# Patient Record
Sex: Female | Born: 1945 | Race: White | Hispanic: No | Smoking: Current every day smoker
Health system: Southern US, Community
[De-identification: ages and names within clinical notes are randomized; demographics above are authoritative.]

## PROBLEM LIST (undated history)

## (undated) DIAGNOSIS — I639 Cerebral infarction, unspecified: Secondary | ICD-10-CM

## (undated) DIAGNOSIS — F329 Major depressive disorder, single episode, unspecified: Secondary | ICD-10-CM

## (undated) DIAGNOSIS — I10 Essential (primary) hypertension: Secondary | ICD-10-CM

## (undated) DIAGNOSIS — N3946 Mixed incontinence: Secondary | ICD-10-CM

## (undated) DIAGNOSIS — F32A Depression, unspecified: Secondary | ICD-10-CM

## (undated) DIAGNOSIS — R32 Unspecified urinary incontinence: Secondary | ICD-10-CM

## (undated) DIAGNOSIS — R296 Repeated falls: Secondary | ICD-10-CM

## (undated) DIAGNOSIS — R251 Tremor, unspecified: Secondary | ICD-10-CM

## (undated) DIAGNOSIS — I4891 Unspecified atrial fibrillation: Secondary | ICD-10-CM

## (undated) HISTORY — PX: APPENDECTOMY: SHX54

## (undated) HISTORY — DX: Mixed incontinence: N39.46

## (undated) HISTORY — DX: Major depressive disorder, single episode, unspecified: F32.9

## (undated) HISTORY — DX: Depression, unspecified: F32.A

## (undated) HISTORY — DX: Tremor, unspecified: R25.1

## (undated) HISTORY — PX: TUBAL LIGATION: SHX77

## (undated) HISTORY — DX: Unspecified urinary incontinence: R32

## (undated) HISTORY — DX: Repeated falls: R29.6

---

## 2004-12-26 ENCOUNTER — Inpatient Hospital Stay: Payer: Self-pay | Admitting: Internal Medicine

## 2004-12-26 ENCOUNTER — Other Ambulatory Visit: Payer: Self-pay

## 2007-03-02 ENCOUNTER — Inpatient Hospital Stay: Payer: Self-pay | Admitting: Internal Medicine

## 2007-03-02 ENCOUNTER — Other Ambulatory Visit: Payer: Self-pay

## 2007-04-08 ENCOUNTER — Emergency Department: Payer: Self-pay | Admitting: Emergency Medicine

## 2007-04-15 ENCOUNTER — Emergency Department: Payer: Self-pay | Admitting: Emergency Medicine

## 2009-05-26 ENCOUNTER — Emergency Department: Payer: Self-pay | Admitting: Emergency Medicine

## 2009-06-16 ENCOUNTER — Ambulatory Visit: Payer: Self-pay | Admitting: Internal Medicine

## 2009-06-21 ENCOUNTER — Ambulatory Visit: Payer: Self-pay | Admitting: Internal Medicine

## 2009-06-30 ENCOUNTER — Ambulatory Visit: Payer: Self-pay | Admitting: Internal Medicine

## 2009-07-07 ENCOUNTER — Ambulatory Visit: Payer: Self-pay | Admitting: Internal Medicine

## 2009-07-14 ENCOUNTER — Ambulatory Visit: Payer: Self-pay | Admitting: Internal Medicine

## 2009-07-21 ENCOUNTER — Ambulatory Visit: Payer: Self-pay | Admitting: Internal Medicine

## 2011-11-15 ENCOUNTER — Emergency Department: Payer: Self-pay | Admitting: Emergency Medicine

## 2014-12-24 ENCOUNTER — Other Ambulatory Visit: Payer: Self-pay | Admitting: Family Medicine

## 2014-12-24 DIAGNOSIS — Z1231 Encounter for screening mammogram for malignant neoplasm of breast: Secondary | ICD-10-CM

## 2015-01-12 ENCOUNTER — Ambulatory Visit: Payer: Self-pay | Attending: Family Medicine

## 2015-12-06 ENCOUNTER — Encounter: Payer: Self-pay | Admitting: *Deleted

## 2015-12-21 ENCOUNTER — Encounter: Payer: Self-pay | Admitting: Urology

## 2015-12-21 ENCOUNTER — Ambulatory Visit: Payer: Self-pay | Admitting: Urology

## 2016-12-19 ENCOUNTER — Ambulatory Visit: Payer: Self-pay | Admitting: Urology

## 2017-01-08 ENCOUNTER — Ambulatory Visit: Payer: Self-pay | Admitting: Urology

## 2017-01-08 ENCOUNTER — Encounter: Payer: Self-pay | Admitting: Urology

## 2017-10-29 ENCOUNTER — Inpatient Hospital Stay
Admission: EM | Admit: 2017-10-29 | Discharge: 2017-11-01 | DRG: 070 | Disposition: A | Discharge status: Home Health | Payer: Medicare HMO | Attending: Internal Medicine | Admitting: Internal Medicine

## 2017-10-29 ENCOUNTER — Encounter: Payer: Self-pay | Admitting: Internal Medicine

## 2017-10-29 ENCOUNTER — Other Ambulatory Visit: Payer: Self-pay

## 2017-10-29 ENCOUNTER — Inpatient Hospital Stay: Payer: Medicare HMO

## 2017-10-29 ENCOUNTER — Emergency Department: Payer: Medicare HMO

## 2017-10-29 DIAGNOSIS — I639 Cerebral infarction, unspecified: Secondary | ICD-10-CM | POA: Diagnosis present

## 2017-10-29 DIAGNOSIS — G9341 Metabolic encephalopathy: Principal | ICD-10-CM | POA: Diagnosis present

## 2017-10-29 DIAGNOSIS — Z66 Do not resuscitate: Secondary | ICD-10-CM | POA: Diagnosis present

## 2017-10-29 DIAGNOSIS — Z87891 Personal history of nicotine dependence: Secondary | ICD-10-CM | POA: Diagnosis not present

## 2017-10-29 DIAGNOSIS — R1110 Vomiting, unspecified: Secondary | ICD-10-CM

## 2017-10-29 DIAGNOSIS — E872 Acidosis: Secondary | ICD-10-CM | POA: Diagnosis present

## 2017-10-29 DIAGNOSIS — Z79899 Other long term (current) drug therapy: Secondary | ICD-10-CM

## 2017-10-29 DIAGNOSIS — N390 Urinary tract infection, site not specified: Secondary | ICD-10-CM | POA: Diagnosis not present

## 2017-10-29 DIAGNOSIS — I10 Essential (primary) hypertension: Secondary | ICD-10-CM | POA: Diagnosis present

## 2017-10-29 DIAGNOSIS — R262 Difficulty in walking, not elsewhere classified: Secondary | ICD-10-CM | POA: Diagnosis present

## 2017-10-29 DIAGNOSIS — G8191 Hemiplegia, unspecified affecting right dominant side: Secondary | ICD-10-CM | POA: Diagnosis present

## 2017-10-29 DIAGNOSIS — N3000 Acute cystitis without hematuria: Secondary | ICD-10-CM | POA: Diagnosis present

## 2017-10-29 DIAGNOSIS — R000 Tachycardia, unspecified: Secondary | ICD-10-CM | POA: Diagnosis present

## 2017-10-29 DIAGNOSIS — I340 Nonrheumatic mitral (valve) insufficiency: Secondary | ICD-10-CM | POA: Diagnosis not present

## 2017-10-29 DIAGNOSIS — N179 Acute kidney failure, unspecified: Secondary | ICD-10-CM | POA: Diagnosis present

## 2017-10-29 DIAGNOSIS — R531 Weakness: Secondary | ICD-10-CM

## 2017-10-29 DIAGNOSIS — Z7189 Other specified counseling: Secondary | ICD-10-CM | POA: Diagnosis not present

## 2017-10-29 DIAGNOSIS — Z515 Encounter for palliative care: Secondary | ICD-10-CM | POA: Diagnosis present

## 2017-10-29 DIAGNOSIS — G9340 Encephalopathy, unspecified: Secondary | ICD-10-CM | POA: Diagnosis not present

## 2017-10-29 DIAGNOSIS — I4891 Unspecified atrial fibrillation: Secondary | ICD-10-CM | POA: Diagnosis present

## 2017-10-29 DIAGNOSIS — E785 Hyperlipidemia, unspecified: Secondary | ICD-10-CM | POA: Diagnosis present

## 2017-10-29 DIAGNOSIS — N3946 Mixed incontinence: Secondary | ICD-10-CM | POA: Diagnosis present

## 2017-10-29 DIAGNOSIS — R251 Tremor, unspecified: Secondary | ICD-10-CM | POA: Diagnosis present

## 2017-10-29 DIAGNOSIS — L8990 Pressure ulcer of unspecified site, unspecified stage: Secondary | ICD-10-CM

## 2017-10-29 DIAGNOSIS — R159 Full incontinence of feces: Secondary | ICD-10-CM | POA: Diagnosis present

## 2017-10-29 DIAGNOSIS — Z9181 History of falling: Secondary | ICD-10-CM | POA: Diagnosis not present

## 2017-10-29 HISTORY — DX: Unspecified atrial fibrillation: I48.91

## 2017-10-29 HISTORY — DX: Essential (primary) hypertension: I10

## 2017-10-29 HISTORY — DX: Cerebral infarction, unspecified: I63.9

## 2017-10-29 LAB — COMPREHENSIVE METABOLIC PANEL
ALK PHOS: 91 U/L (ref 38–126)
ALT: 11 U/L — ABNORMAL LOW (ref 14–54)
ANION GAP: 13 (ref 5–15)
AST: 26 U/L (ref 15–41)
Albumin: 4.2 g/dL (ref 3.5–5.0)
BUN: 26 mg/dL — ABNORMAL HIGH (ref 6–20)
CALCIUM: 9.4 mg/dL (ref 8.9–10.3)
CO2: 20 mmol/L — ABNORMAL LOW (ref 22–32)
Chloride: 107 mmol/L (ref 101–111)
Creatinine, Ser: 1.49 mg/dL — ABNORMAL HIGH (ref 0.44–1.00)
GFR, EST AFRICAN AMERICAN: 39 mL/min — AB (ref >60)
GFR, EST NON AFRICAN AMERICAN: 34 mL/min — AB (ref >60)
GLUCOSE: 108 mg/dL — AB (ref 65–99)
POTASSIUM: 4.2 mmol/L (ref 3.5–5.1)
Sodium: 140 mmol/L (ref 135–145)
TOTAL PROTEIN: 7.9 g/dL (ref 6.5–8.1)
Total Bilirubin: 0.9 mg/dL (ref 0.3–1.2)

## 2017-10-29 LAB — CBC WITH DIFFERENTIAL/PLATELET
BASOS PCT: 1 %
Basophils Absolute: 0 10*3/uL (ref 0–0.1)
EOS ABS: 0 10*3/uL (ref 0–0.7)
EOS PCT: 1 %
HCT: 48.5 % — ABNORMAL HIGH (ref 35.0–47.0)
HEMOGLOBIN: 15.5 g/dL (ref 12.0–16.0)
LYMPHS ABS: 0.5 10*3/uL — AB (ref 1.0–3.6)
Lymphocytes Relative: 8 %
MCH: 28.8 pg (ref 26.0–34.0)
MCHC: 32.1 g/dL (ref 32.0–36.0)
MCV: 89.7 fL (ref 80.0–100.0)
MONO ABS: 0.5 10*3/uL (ref 0.2–0.9)
MONOS PCT: 6 %
NEUTROS PCT: 84 %
Neutro Abs: 6.2 10*3/uL (ref 1.4–6.5)
PLATELETS: 121 10*3/uL — AB (ref 150–440)
RBC: 5.4 MIL/uL — ABNORMAL HIGH (ref 3.80–5.20)
RDW: 16.3 % — AB (ref 11.5–14.5)
WBC: 7.3 10*3/uL (ref 3.6–11.0)

## 2017-10-29 LAB — URINALYSIS, COMPLETE (UACMP) WITH MICROSCOPIC
BILIRUBIN URINE: NEGATIVE
Bacteria, UA: NONE SEEN
Glucose, UA: NEGATIVE mg/dL
Ketones, ur: NEGATIVE mg/dL
Nitrite: POSITIVE — AB
PH: 5 (ref 5.0–8.0)
Protein, ur: 100 mg/dL — AB
SPECIFIC GRAVITY, URINE: 1.016 (ref 1.005–1.030)

## 2017-10-29 LAB — LACTIC ACID, PLASMA
LACTIC ACID, VENOUS: 2.2 mmol/L — AB (ref 0.5–1.9)
Lactic Acid, Venous: 2 mmol/L (ref 0.5–1.9)

## 2017-10-29 LAB — TROPONIN I: TROPONIN I: 0.03 ng/mL — AB (ref <0.03)

## 2017-10-29 LAB — PROTIME-INR
INR: 0.96
Prothrombin Time: 12.7 seconds (ref 11.4–15.2)

## 2017-10-29 LAB — CK: Total CK: 59 U/L (ref 38–234)

## 2017-10-29 MED ORDER — METOPROLOL TARTRATE 5 MG/5ML IV SOLN: 5.0000 mg | INTRAVENOUS | Status: DC | PRN

## 2017-10-29 MED ORDER — ACETAMINOPHEN 325 MG PO TABS: 650.0000 mg | ORAL_TABLET | Freq: Four times a day (QID) | ORAL | Status: DC | PRN

## 2017-10-29 MED ORDER — ONDANSETRON HCL 4 MG/2ML IJ SOLN
INTRAMUSCULAR | Status: AC
  Administered 2017-10-29: 4 mg via INTRAVENOUS
  Filled 2017-10-29: qty 2

## 2017-10-29 MED ORDER — ONDANSETRON HCL 4 MG/2ML IJ SOLN
4.0000 mg | Freq: Four times a day (QID) | INTRAMUSCULAR | Status: DC | PRN
  Administered 2017-10-29: 4 mg via INTRAVENOUS

## 2017-10-29 MED ORDER — LISINOPRIL 10 MG PO TABS: 30.0000 mg | ORAL_TABLET | Freq: Every day | ORAL | Status: DC

## 2017-10-29 MED ORDER — ENOXAPARIN SODIUM 40 MG/0.4ML ~~LOC~~ SOLN
40.0000 mg | SUBCUTANEOUS | Status: DC
  Administered 2017-10-29 – 2017-10-31 (×3): 40 mg via SUBCUTANEOUS
  Filled 2017-10-29 (×3): qty 0.4

## 2017-10-29 MED ORDER — ATORVASTATIN CALCIUM 20 MG PO TABS
40.0000 mg | ORAL_TABLET | Freq: Every day | ORAL | Status: DC
  Administered 2017-10-30 – 2017-11-01 (×3): 40 mg via ORAL
  Filled 2017-10-29 (×3): qty 2

## 2017-10-29 MED ORDER — ACETAMINOPHEN 650 MG RE SUPP: 650.0000 mg | Freq: Four times a day (QID) | RECTAL | Status: DC | PRN

## 2017-10-29 MED ORDER — NYSTATIN 100000 UNIT/GM EX POWD
Freq: Two times a day (BID) | CUTANEOUS | Status: DC
  Administered 2017-10-29 – 2017-11-01 (×6): via TOPICAL
  Filled 2017-10-29: qty 15

## 2017-10-29 MED ORDER — SODIUM CHLORIDE 0.9 % IV SOLN
1.0000 g | Freq: Once | INTRAVENOUS | Status: AC
  Administered 2017-10-29: 1 g via INTRAVENOUS
  Filled 2017-10-29: qty 10

## 2017-10-29 MED ORDER — SODIUM CHLORIDE 0.9 % IV SOLN
1.0000 g | INTRAVENOUS | Status: DC
  Administered 2017-10-30 – 2017-10-31 (×2): 1 g via INTRAVENOUS
  Filled 2017-10-29 (×2): qty 10

## 2017-10-29 MED ORDER — ASPIRIN 300 MG RE SUPP: 300.0000 mg | Freq: Every day | RECTAL | Status: DC | PRN

## 2017-10-29 MED ORDER — SODIUM CHLORIDE 0.9 % IV BOLUS (SEPSIS)
500.0000 mL | Freq: Once | INTRAVENOUS | Status: AC
  Administered 2017-10-29: 500 mL via INTRAVENOUS

## 2017-10-29 MED ORDER — DILTIAZEM HCL ER COATED BEADS 240 MG PO CP24
240.0000 mg | ORAL_CAPSULE | Freq: Every day | ORAL | Status: DC
  Filled 2017-10-29: qty 1

## 2017-10-29 MED ORDER — SODIUM CHLORIDE 0.9 % IV SOLN
INTRAVENOUS | Status: DC
  Administered 2017-10-29 – 2017-11-01 (×3): via INTRAVENOUS

## 2017-10-29 MED ORDER — ASPIRIN EC 81 MG PO TBEC: 81.0000 mg | DELAYED_RELEASE_TABLET | Freq: Every day | ORAL | Status: DC

## 2017-10-29 NOTE — H&P (Signed)
[facility] [place] [facility]   PATIENT NAME: Destiny Mack    MR#:  [id number]  DATE OF BIRTH:  01/13/1946  DATE OF ADMISSION:  10/29/2017  PRIMARY CARE PHYSICIAN: Dr Hessie Diener  REQUESTING/REFERRING PHYSICIAN: Dr Alphonzo Lemmings  CHIEF COMPLAINT:   Chief Complaint  Patient presents with  . Altered Mental Status    HISTORY OF PRESENT ILLNESS:  Destiny Mack  is a 72 y.o. female with a known history of recent stroke 3-4 months ago when she refused to come into the hospital.  She has right-sided weakness and unable to walk since then.  She is coming in with altered mental status for the last 2 days.  She has not been eating.  She has been unable to smoke and family took away her cigarettes over the last 2 days.  Since that stroke she has had no feeling of using the bathroom with her bowels and urine.  When she came in she was covered in stool and urine.  Hospitalist services were contacted for further evaluation with altered mental status.  Found to have a positive urine analysis.  PAST MEDICAL HISTORY:   Past Medical History:  Diagnosis Date  . Depression   . Frequent falls   . Leaking of urine   . Mixed incontinence   . Stroke (HCC)   . Tremor of hands and face     PAST SURGICAL HISTORY:   Past Surgical History:  Procedure Laterality Date  . TUBAL LIGATION      SOCIAL HISTORY:   Social History   Tobacco Use  . Smoking status: Former Games developer  . Smokeless tobacco: Never Used  Substance Use Topics  . Alcohol use: No    Frequency: Never    FAMILY HISTORY:   Family History  Problem Relation Age of Onset  . Dementia Mother   . Heart failure Father     DRUG ALLERGIES:  No Known Allergies  REVIEW OF SYSTEMS:  Limited with altered mental status. Cardiovascular no chest pain Respiratory no shortness of breath Gastrointestinal no abdominal pain  MEDICATIONS AT HOME:   Prior to Admission medications   Not on File   Medication reconciliation  still undergoing.  Takes atorvastatin 20 mg p.o. daily Norvasc 5 mg p.o. daily lisinopril 30 mg p.o. daily  VITAL SIGNS:  Blood pressure (!) 183/75, pulse 90, resp. rate 18, height 5\' 4"  (1.626 m), weight 72.6 kg (160 lb).  PHYSICAL EXAMINATION:  GENERAL:  72 y.o.-year-old patient lying in the bed with no acute distress.  EYES: Pupils equal, round, reactive to light and accommodation. No scleral icterus. Extraocular muscles intact.  HEENT: Head atraumatic, normocephalic. Oropharynx and nasopharynx clear.  NECK:  Supple, no jugular venous distention. No thyroid enlargement, no tenderness.  LUNGS: Normal breath sounds bilaterally, no wheezing, rales,rhonchi or crepitation. No use of accessory muscles of respiration.  CARDIOVASCULAR: S1, S2 tachycardic. No murmurs, rubs, or gallops.  ABDOMEN: Soft, nontender, nondistended. Bowel sounds present. No organomegaly or mass.  EXTREMITIES: Trace edema, no cyanosis, or clubbing.  NEUROLOGIC: Cranial nerves II through XII are intact.  Patient unable to move right arm.  Weakness right leg.  Tremor upper extremities PSYCHIATRIC: The patient is alert.  SKIN: Buttock with erythema and some excoriations.  Present on admission  LABORATORY PANEL:   CBC Recent Labs  Lab 10/29/17 1112  WBC 7.3  HGB 15.5  HCT 48.5*  PLT 121*   ------------------------------------------------------------------------------------------------------------------  Chemistries  Recent Labs  Lab 10/29/17 1112  NA 140  K 4.2  CL 107  CO2 20*  GLUCOSE 108*  BUN 26*  CREATININE 1.49*  CALCIUM 9.4  AST 26  ALT 11*  ALKPHOS 91  BILITOT 0.9   ------------------------------------------------------------------------------------------------------------------  Cardiac Enzymes Recent Labs  Lab 10/29/17 1112  TROPONINI 0.03*   ------------------------------------------------------------------------------------------------------------------  RADIOLOGY:  Ct Head Wo  Contrast  Result Date: 10/29/2017 CLINICAL DATA:  Muscle weakness EXAM: CT HEAD WITHOUT CONTRAST TECHNIQUE: Contiguous axial images were obtained from the base of the skull through the vertex without intravenous contrast. COMPARISON:  None. FINDINGS: Brain: There is atrophy and chronic small vessel disease changes. No acute intracranial abnormality. Specifically, no hemorrhage, hydrocephalus, mass lesion, acute infarction, or significant intracranial injury. Vascular: No hyperdense vessel or unexpected calcification. Skull: No acute calvarial abnormality. Sinuses/Orbits: Visualized paranasal sinuses and mastoids clear. Orbital soft tissues unremarkable. Other: None IMPRESSION: No acute intracranial abnormality. Atrophy, chronic microvascular disease. Electronically Signed   By: Charlett NoseKevin  Dover M.D.   On: 10/29/2017 12:12   Dg Chest Port 1 View  Result Date: 10/29/2017 CLINICAL DATA:  Altered mental status, weakness EXAM: PORTABLE CHEST 1 VIEW COMPARISON:  Chest x-ray of March 02, 2007 FINDINGS: The lungs are adequately inflated and clear. The heart and pulmonary vascularity are normal. The mediastinum is normal in width. There is faint calcification in the wall of the thoracic aorta. The bony thorax exhibits no acute abnormality. IMPRESSION: There is no active cardiopulmonary disease. Thoracic aortic atherosclerosis. Electronically Signed   By: David  SwazilandJordan M.D.   On: 10/29/2017 12:27    EKG:   Sinus rhythm 64 bpm, left anterior fascicular block flipped T waves laterally  IMPRESSION AND PLAN:   1.  Acute encephalopathy with acute cystitis.  IV fluid hydration and IV Rocephin.  Urine culture pending. 2.  Recent stroke with right-sided weakness and never had a workup.  Obtain MRI of the brain, carotid ultrasound and echocardiogram and monitor on telemetry.  Physical therapy occupational therapy and speech therapy evaluations.  Swallow evaluation.  Rectal aspirin if unable to take oral.  Continue  atorvastatin. 3.  Nausea vomiting.  Start Zofran.  Abdominal x-ray. 4.  Tachycardia.  Give IV fluid hydration.  PRN IV metoprolol.  Switched Norvasc over to Cardizem CD. 5.  Accelerated hypertension.  Restart lisinopril and use Cardizem CD. 6.  Hyperlipidemia unspecified on Lipitor check a lipid profile tomorrow morning 7.  Lactic acidosis likely from acute cystitis 8.  Acute kidney injury.  IV fluid hydration.   I personally reviewed the EKG, chest x-ray and laboratory data. Management plans discussed with the patient, family and they are in agreement.  CODE STATUS: Full code  TOTAL TIME TAKING CARE OF THIS PATIENT: 50 minutes.    Alford Highlandichard Hektor Huston M.D on 10/29/2017 at 1:03 PM  Between 7am to 6pm - Pager - [phone]  After 6pm call admission pager [phone]  [facility] Office  [phone]  CC: Primary care physician; Dr. Hessie DienerBender

## 2017-10-29 NOTE — ED Provider Notes (Addendum)
[facility] Emergency Department Provider Note  ____________________________________________   I have reviewed the triage vital signs and the nursing notes. Where available I have reviewed prior notes and, if possible and indicated, outside hospital notes.    HISTORY  Chief Complaint Altered Mental Status    HPI Destiny Mack is a 72 y.o. female level 5 chart caveat; no further history available due to patient status.  Patient here from home via EMS.  Apparently she has been weak for the last several days.  Unclear focally weak.  Patient herself cannot give me history she will tell me her name and where she is but that is about it.  She states she was feeling "weak".  EMS states they were called to the house because patient was feeling weak.  Failure to thrive essentially.  They did start an IO as a limited access.  She was afebrile with reassuring vital signs apparently for them.  Patient was noted to be covered in dried stool. Attempt to call all contact numbers resulted in no answer and one was disconnected the other stated "cannot receive calls".    Past Medical History:  Diagnosis Date  . Depression   . Frequent falls   . Leaking of urine   . Mixed incontinence   . Tremor of hands and face     There are no active problems to display for this patient.     Prior to Admission medications   Not on File    Allergies Patient has no known allergies.  No family history on file.  Social History Social History   Tobacco Use  . Smoking status: Not on file  Substance Use Topics  . Alcohol use: Not on file  . Drug use: Not on file    Review of Systems Level 5 chart caveat; no further history available due to patient status.  ____________________________________________   PHYSICAL EXAM:  VITAL SIGNS: ED Triage Vitals  Enc Vitals Group     BP 10/29/17 1055 (!) 183/75     Pulse Rate 10/29/17 1055 90     Resp 10/29/17 1055 18     Temp --       Temp src --      SpO2 --      Weight 10/29/17 1100 160 lb (72.6 kg)     Height 10/29/17 1100 5\' 4"  (1.626 m)     Head Circumference --      Peak Flow --      Pain Score --      Pain Loc --      Pain Edu? --      Excl. in GC? --     Constitutional: Alert and oriented to name and place unsure of the date in no acute distress.  Eyes: Conjunctivae are normal Head: Atraumatic HEENT: No congestion/rhinnorhea. Mucous membranes are moist.  Oropharynx non-erythematous Neck:   Nontender with no meningismus, no masses, no stridor Cardiovascular: Normal rate, regular rhythm. Grossly normal heart sounds.  Good peripheral circulation. Respiratory: Normal respiratory effort.  No retractions. Lungs CTAB. Abdominal: Soft and nontender. No distention. No guarding no rebound Back:  There is no focal tenderness or step off.  there is no midline tenderness there are no lesions noted. there is no CVA tenderness Musculoskeletal: No lower extremity tenderness, no upper extremity tenderness. No joint effusions, no DVT signs strong distal pulses no edema Neurologic:  Normal speech and language.  Very limited exam, does follow commands, seems to have  right upper extremity weakness and to some extent lower extremity weakness but there is limited compliance with exam.  Unclear the timing of this.  No obvious cranial nerve deficits to the extent that can be determined given limited exam.  Speech seems clear, unclear if there is aphasia given limited compliance with exam. Skin:  Skin is warm, dry and intact.  Early decubitus breakdown noted on the posterior sacrum, dried fecal matter noted extensively on patient's bottom Psychiatric: Mood and affect are normal. Speech and behavior are normal.  ____________________________________________   LABS (all labs ordered are listed, but only abnormal results are displayed)  Labs Reviewed  CK  CBC WITH DIFFERENTIAL/PLATELET  TROPONIN I  LACTIC ACID, PLASMA  LACTIC  ACID, PLASMA  COMPREHENSIVE METABOLIC PANEL  URINALYSIS, COMPLETE (UACMP) WITH MICROSCOPIC  PROTIME-INR    Pertinent labs  results that were available during my care of the patient were reviewed by me and considered in my medical decision making (see chart for details). ____________________________________________  EKG  I personally interpreted any EKGs ordered by me or triage Sinus rhythm, rate 64 bpm no acute ST elevationnonspecific ST depressions noted laterally, LAD noted.  LAFB noted. ____________________________________________  RADIOLOGY  Pertinent labs & imaging results that were available during my care of the patient were reviewed by me and considered in my medical decision making (see chart for details). If possible, patient and/or family made aware of any abnormal findings.  No results found. ____________________________________________    PROCEDURES  Procedure(s) performed: None  Procedures  Critical Care performed: None  ____________________________________________   INITIAL IMPRESSION / ASSESSMENT AND PLAN / ED COURSE  Pertinent labs & imaging results that were available during my care of the patient were reviewed by me and considered in my medical decision making (see chart for details).  Patient here with generalized weakness, from home.  Could not obtain information from family or anyone else aside from EMS.  We will obtain CT scan of the head as she does appear to have a right-sided weakness of unclear timing, will check total CK we will investigate for possible source of infection if such is the case with urinalysis and chest x-ray and will reassess closely.      ____________________________________________   FINAL CLINICAL IMPRESSION(S) / ED DIAGNOSES  Final diagnoses:  None      This chart was dictated using voice recognition software.  Despite best efforts to proofread,  errors can occur which can change meaning.      Jeanmarie PlantMcShane, Tajuana Kniskern A,  MD 10/29/17 1143    Jeanmarie PlantMcShane, Randa Riss A, MD 10/29/17 [phone]

## 2017-10-29 NOTE — Progress Notes (Signed)
Patient ID: Destiny BeringLinda M Mack, female   DOB: [birth year], 72 y.o.   MRN: [id number] Called earlier about MRI results showing acute strokes.  Patient failed the bedside swallow evaluation.  Rectal aspirin ordered, speech therapy and neurology consult also ordered.  Dr. Alford Highlandichard Keshanna Riso

## 2017-10-29 NOTE — ED Notes (Signed)
Admitting MD at bedside. Family at bedside as well.

## 2017-10-29 NOTE — Consult Note (Signed)
Pharmacy Antibiotic Note  Destiny BeringLinda M Mack is a 72 y.o. female admitted on 10/29/2017 with UTI.  Pharmacy has been consulted for Ceftriaxone dosing.  Plan: Start ceftriaxone 1g IV every 24 hours.   Height: 5\' 4"  (162.6 cm) Weight: 160 lb (72.6 kg) IBW/kg (Calculated) : 54.7  No data recorded.  Recent Labs  Lab 10/29/17 1112 10/29/17 1145  WBC 7.3  --   CREATININE 1.49*  --   LATICACIDVEN  --  2.2*    Estimated Creatinine Clearance: 33.4 mL/min (A) (by C-G formula based on SCr of 1.49 mg/dL (H)).    No Known Allergies  Antimicrobials this admission: 3/4 ceftriaxone  >>   Dose adjustments this admission:   Microbiology results: 3/4 UCx:   Thank you for allowing pharmacy to be a part of this patient's care.  Gardner CandleSheema M Yohan Samons, PharmD, BCPS Clinical Pharmacist 10/29/2017 1:04 PM

## 2017-10-29 NOTE — ED Notes (Addendum)
Pt to MRI and then to Ultrasound before being transferred to floor. Floor RN made aware as wel as ultrasound staff to inform RN when ultrasound is finished.

## 2017-10-29 NOTE — ED Notes (Signed)
Pt had one episode of vomiting per family. RN entered room and pt was no longer vomiting. Clear saliva is all that is noted in emesis bag. HR elevated and MD aware. Pt no longer tachycardic. Pt verbalized feeling "okay" at this time.

## 2017-10-29 NOTE — ED Triage Notes (Signed)
Pt arrived via [facility] from home for altered mental status. Per EMS decreased oral intake x3 days. Pt arrived covered in dry stool. Pt poor historian. GCS 14.

## 2017-10-29 NOTE — Care Management (Signed)
RNCM consult for wheelchair. [facility] to review.

## 2017-10-29 NOTE — Progress Notes (Signed)
Dr Renae GlossWieting notified that lactic acid is now 2.0

## 2017-10-30 ENCOUNTER — Inpatient Hospital Stay (HOSPITAL_COMMUNITY)
Admit: 2017-10-30 | Discharge: 2017-10-30 | Disposition: A | Discharge status: Home / Self Care | Payer: Medicare HMO | Attending: Internal Medicine | Admitting: Internal Medicine

## 2017-10-30 DIAGNOSIS — I340 Nonrheumatic mitral (valve) insufficiency: Secondary | ICD-10-CM

## 2017-10-30 LAB — CBC
HCT: 39.4 % (ref 35.0–47.0)
Hemoglobin: 12.9 g/dL (ref 12.0–16.0)
MCH: 28.7 pg (ref 26.0–34.0)
MCHC: 32.6 g/dL (ref 32.0–36.0)
MCV: 88.1 fL (ref 80.0–100.0)
PLATELETS: 109 10*3/uL — AB (ref 150–440)
RBC: 4.48 MIL/uL (ref 3.80–5.20)
RDW: 16 % — ABNORMAL HIGH (ref 11.5–14.5)
WBC: 5.9 10*3/uL (ref 3.6–11.0)

## 2017-10-30 LAB — BASIC METABOLIC PANEL
Anion gap: 10 (ref 5–15)
BUN: 26 mg/dL — ABNORMAL HIGH (ref 6–20)
CO2: 20 mmol/L — ABNORMAL LOW (ref 22–32)
Calcium: 8.7 mg/dL — ABNORMAL LOW (ref 8.9–10.3)
Chloride: 112 mmol/L — ABNORMAL HIGH (ref 101–111)
Creatinine, Ser: 1.58 mg/dL — ABNORMAL HIGH (ref 0.44–1.00)
GFR, EST AFRICAN AMERICAN: 37 mL/min — AB (ref >60)
GFR, EST NON AFRICAN AMERICAN: 32 mL/min — AB (ref >60)
Glucose, Bld: 90 mg/dL (ref 65–99)
POTASSIUM: 4.2 mmol/L (ref 3.5–5.1)
SODIUM: 142 mmol/L (ref 135–145)

## 2017-10-30 LAB — URINE CULTURE: CULTURE: NO GROWTH

## 2017-10-30 LAB — LIPID PANEL
CHOL/HDL RATIO: 4.1 ratio
Cholesterol: 140 mg/dL (ref 0–200)
HDL: 34 mg/dL — ABNORMAL LOW (ref >40)
LDL CALC: 84 mg/dL (ref 0–99)
Triglycerides: 109 mg/dL (ref <150)
VLDL: 22 mg/dL (ref 0–40)

## 2017-10-30 LAB — ECHOCARDIOGRAM COMPLETE
Height: 64 in
Weight: 2560 oz

## 2017-10-30 MED ORDER — ASPIRIN EC 325 MG PO TBEC
325.0000 mg | DELAYED_RELEASE_TABLET | Freq: Every day | ORAL | Status: DC
  Administered 2017-10-30 – 2017-11-01 (×3): 325 mg via ORAL
  Filled 2017-10-30 (×3): qty 1

## 2017-10-30 NOTE — Clinical Social Work Note (Signed)
Clinical Social Work Assessment  Patient Details  Name: Destiny BeringLinda M Mack MRN: [id number] Date of Birth: 12/21/1945  Date of referral:  10/30/17               Reason for consult:  Facility Placement                Permission sought to share information with:  Oceanographeracility Contact Representative Permission granted to share information::  Yes, Verbal Permission Granted  Name::      Skilled Nursing Facility   Agency::   [place]   Relationship::     Contact Information:     Housing/Transportation Living arrangements for the past 2 months:  Single Family Home Source of Information:  Adult Children Patient Interpreter Needed:  None Criminal Activity/Legal Involvement Pertinent to Current Situation/Hospitalization:  No - Comment as needed Significant Relationships:  Adult Children Lives with:  Adult Children Do you feel safe going back to the place where you live?  Yes Need for family participation in patient care:  Yes (Comment)  Care giving concerns:  [place] in [name] with [place] daughter Destiny Mack.    Social Worker assessment / plan:  Visual merchandiserClinical Social Worker (CSW) reviewed chart and noted that PT is recommending CIR. Per RN case manager's note patient's daughter wants a local SNF and requested [name]. Per chart patient is not alert and oriented so CSW contacted patient's daughter Destiny Mack. Per daughter she lives with patient in [name] and helps patient make decisions. CSW explained SNF process and that [name] will have to approve it. Daughter verbalized her understanding and is agreeable [place]. Daughter requested [facility] because her niece works there. FL2 complete and faxed out.   CSW presented bed offers to daughter and she chose [name]. CSW started [facility] via [facility]. [facility] admissions coordinator at [name] is aware of above. CSW will continue to follow and assist as needed.   Employment  status:  Retired Database administratornsurance information:  Managed Medicare PT Recommendations:  Skilled Nursing Facility Information / Referral to community resources:  Skilled Nursing Facility  Patient/Family's Response to care:  Patient's daughter chose [name].   Patient/Family's Understanding of and Emotional Response to Diagnosis, Current Treatment, and Prognosis:  Patient's daughter was very pleasant and thanked CSW for assistance.   Emotional Assessment Appearance:  Appears stated age Attitude/Demeanor/Rapport:  Unable to Assess Affect (typically observed):  Unable to Assess Orientation:  Oriented to Self, Oriented to Place, Fluctuating Orientation (Suspected and/or reported Sundowners) Alcohol / Substance use:  Not Applicable Psych involvement (Current and /or in the community):  No (Comment)  Discharge Needs  Concerns to be addressed:  Discharge Planning Concerns Readmission within the last 30 days:  No Current discharge risk:  Dependent with Mobility Barriers to Discharge:  Continued Medical Work up   [employer], Darleen CrockerBailey M, LCSW 10/30/2017, 3:04 PM

## 2017-10-30 NOTE — Consult Note (Signed)
Referring Physician: Elisabeth PigeonVachhani    Chief Complaint: Altered mental status  HPI: Destiny Mack is an 72 y.o. female with a history of atrial fibrillation on no anticoagulation who had a stroke about 3-4 months ago for which she did not seek medical attention who presented with altered mental status.  Patient unable to provide any history due to mental status and family not available therefore all history obtained from the chart.  Patient has had right sided weakness, incontinence and been unable to walk since her stroke a few months ago.  She has not been eating.  She has been unable to smoke and family took away her cigarettes over the last 2 days.  When she came in she was covered in stool and urine.    Date last known well: Unable to determine Time last known well: Unable to determine tPA Given: No: Unable to determine LKW  Past Medical History:  Diagnosis Date  . A-fib (HCC)   . Depression   . Frequent falls   . Hypertension   . Leaking of urine   . Mixed incontinence   . Stroke (HCC)   . Tremor of hands and face     Past Surgical History:  Procedure Laterality Date  . TUBAL LIGATION      Family History  Problem Relation Age of Onset  . Dementia Mother   . Heart failure Father    Social History:  reports that she quit smoking 3 days ago. She has a 15.00 pack-year smoking history. she has never used smokeless tobacco. She reports that she does not drink alcohol or use drugs.  Allergies: No Known Allergies  Medications:  I have reviewed the patient's current medications. Prior to Admission:  Medications Prior to Admission  Medication Sig Dispense Refill Last Dose  . amLODipine (NORVASC) 5 MG tablet Take 5 mg by mouth daily.   Past Week at Unknown time  . atorvastatin (LIPITOR) 20 MG tablet Take 20 mg by mouth daily.   Past Week at Unknown time  . lisinopril (PRINIVIL,ZESTRIL) 30 MG tablet Take 30 mg by mouth daily.   Past Week at Unknown time   Scheduled: . atorvastatin   40 mg Oral q1800  . enoxaparin (LOVENOX) injection  40 mg Subcutaneous Q24H  . nystatin   Topical BID    ROS: Unable to obtain due to mental status  Physical Examination: Blood pressure 119/83, pulse 85, temperature 99.6 F (37.6 C), temperature source Oral, resp. rate 16, height 5\' 4"  (1.626 m), weight 72.6 kg (160 lb), SpO2 95 %.  HEENT-  Normocephalic, no lesions, without obvious abnormality.  Normal external eye and conjunctiva.  Normal TM's bilaterally.  Normal auditory canals and external ears. Normal external nose, mucus membranes and septum.  Normal pharynx. Cardiovascular- S1, S2 normal, pulses palpable throughout   Lungs- chest clear, no wheezing, rales, normal symmetric air entry Abdomen- soft, non-tender; bowel sounds normal; no masses,  no organomegaly Extremities- no edema Lymph-no adenopathy palpable Musculoskeletal-no joint tenderness, deformity or swelling Skin-warm and dry, no hyperpigmentation, vitiligo, or suspicious lesions  Neurological Examination   Mental Status: Alert.  Unable to provide history.  Oriented to name, place, year and age. Speech fluent without evidence of aphasia.  Unable to follow 3-step commands.   Cranial Nerves: II: Discs flat bilaterally; Visual fields grossly normal, pupils equal, round, reactive to light and accommodation III,IV, VI: ptosis not present, extra-ocular motions intact bilaterally V,VII: smile symmetric, facial light touch sensation normal bilaterally VIII: hearing normal  bilaterally IX,X: gag reflex present XI: bilateral shoulder shrug XII: midline tongue extension Motor: Right : Upper extremity   3/5      Lower extremity   0/5      Able to lift the left easily above gravity and maintain.   Sensory: Pinprick and light touch intact throughout, bilaterally Deep Tendon Reflexes: 2+ in the upper extremities and absent in the lower extremities    Plantars: Right: upgoing   Left: upgoing Cerebellar: Unable to perform due to  ability to follow commands.   Gait: not tested due to safety concerns.     Laboratory Studies:  Basic Metabolic Panel: Recent Labs  Lab 10/29/17 1112 10/30/17 0626  NA 140 142  K 4.2 4.2  CL 107 112*  CO2 20* 20*  GLUCOSE 108* 90  BUN 26* 26*  CREATININE 1.49* 1.58*  CALCIUM 9.4 8.7*    Liver Function Tests: Recent Labs  Lab 10/29/17 1112  AST 26  ALT 11*  ALKPHOS 91  BILITOT 0.9  PROT 7.9  ALBUMIN 4.2   No results for input(s): LIPASE, AMYLASE in the last 168 hours. No results for input(s): AMMONIA in the last 168 hours.  CBC: Recent Labs  Lab 10/29/17 1112 10/30/17 0626  WBC 7.3 5.9  NEUTROABS 6.2  --   HGB 15.5 12.9  HCT 48.5* 39.4  MCV 89.7 88.1  PLT 121* 109*    Cardiac Enzymes: Recent Labs  Lab 10/29/17 1112  CKTOTAL 59  TROPONINI 0.03*    BNP: Invalid input(s): POCBNP  CBG: No results for input(s): GLUCAP in the last 168 hours.  Microbiology: Results for orders placed or performed during the hospital encounter of 10/29/17  Urine culture     Status: None   Collection Time: 10/29/17 11:12 AM  Result Value Ref Range Status   Specimen Description   Final    URINE, RANDOM Performed at [facility], [address] [place], [place] [address]    Special Requests   Final    NONE Performed at [facility], [address] [place], [place] [address]    Culture   Final    NO GROWTH Performed at [facility], [address], [place], [place] [address]    Report Status 10/30/2017 FINAL  Final    Coagulation Studies: Recent Labs    10/29/17 1112  LABPROT 12.7  INR 0.96    Urinalysis:  Recent Labs  Lab 10/29/17 1112  COLORURINE YELLOW*  LABSPEC 1.016  PHURINE 5.0  GLUCOSEU NEGATIVE  HGBUR LARGE*  BILIRUBINUR NEGATIVE  KETONESUR NEGATIVE  PROTEINUR 100*  NITRITE POSITIVE*  LEUKOCYTESUR LARGE*    Lipid Panel: No results found for: CHOL, TRIG, HDL, CHOLHDL, VLDL, LDLCALC  HgbA1C: No results found  for: HGBA1C  Urine Drug Screen:  No results found for: LABOPIA, COCAINSCRNUR, LABBENZ, AMPHETMU, THCU, LABBARB  Alcohol Level: No results for input(s): ETH in the last 168 hours.  Other results: EKG: sinus rhythm at 64 bpm.  Imaging: Ct Head Wo Contrast  Result Date: 10/29/2017 CLINICAL DATA:  Muscle weakness EXAM: CT HEAD WITHOUT CONTRAST TECHNIQUE: Contiguous axial images were obtained from the base of the skull through the vertex without intravenous contrast. COMPARISON:  None. FINDINGS: Brain: There is atrophy and chronic small vessel disease changes. No acute intracranial abnormality. Specifically, no hemorrhage, hydrocephalus, mass lesion, acute infarction, or significant intracranial injury. Vascular: No hyperdense vessel or unexpected calcification. Skull: No acute calvarial abnormality. Sinuses/Orbits: Visualized paranasal sinuses and mastoids clear. Orbital  soft tissues unremarkable. Other: None IMPRESSION: No acute intracranial abnormality. Atrophy, chronic microvascular disease. Electronically Signed   By: Charlett Nose M.D.   On: 10/29/2017 12:12   Mr Brain Wo Contrast  Result Date: 10/29/2017 CLINICAL DATA:  Focal neuro deficit for greater than 6 hours. Known CVA 3 4 months ago. Right-sided weakness. Incontinence. Altered mental status beginning 2 days ago. EXAM: MRI HEAD WITHOUT CONTRAST TECHNIQUE: Multiplanar, multiecho pulse sequences of the brain and surrounding structures were obtained without intravenous contrast. COMPARISON:  CT head without contrast from the same day. FINDINGS: Brain: Diffusion-weighted images demonstrate acute infarcts involving the genu and posterior limb of the right internal capsule as well as the anterior right external capsule. There is a focal punctate area of restricted diffusion involving the left external capsule. No additional focus of signal hyperintensity on the trace diffusion images in the left corona radiata is isointense on the ADC map. This  suggests a more subacute infarct. The T2 and FLAIR sequences demonstrate extensive white matter disease and volume loss within the corona radiata bilaterally. A remote lacunar infarct is present in the anterior right lentiform nucleus. Extensive subcortical white matter changes are noted along the insula bilaterally. Extensive volume loss is present bilaterally. White matter changes extend into the brainstem. A remote lacunar infarct is present in the left paramedian pons. Remote lacunar infarcts are present in the left cerebellum. No hemorrhage or mass lesion is present. Ventricles are proportionate to the degree of atrophy. No significant extra-axial fluid collection is present. Vascular: Flow is present in the major intracranial arteries. Skull and upper cervical spine: The skull base is within normal limits. The upper cervical spine demonstrates multilevel endplate change which appears to be effacing the ventral CSF. Marrow signal is normal. Midline sagittal structures are otherwise unremarkable. Sinuses/Orbits: The paranasal sinuses are clear. There is some fluid in the mastoid air cells bilaterally. Globes and orbits are within normal limits. IMPRESSION: 1. Multiple punctate acute nonhemorrhagic lacunar infarcts involving the right internal capsule, right external capsule, and posterior left external capsule. These infarcts are all within the perforator territories. 2. Subacute nonhemorrhagic lacunar infarct within the left corona radiata. 3. Advanced atrophy and white matter disease consistent with chronic microvascular ischemia. 4. Remote lacunar infarcts involving the left paramedian pons and left cerebellum. 5. Minimal bilateral mastoid effusions. No obstructing nasopharyngeal lesion is present. These results will be called to the ordering clinician or representative by the Radiologist Assistant, and communication documented in the PACS or zVision Dashboard. Electronically Signed   By: Marin Roberts  M.D.   On: 10/29/2017 14:19   US Carotid Bilateral  Result Date: 10/29/2017 CLINICAL DATA:  Stroke, unresponsive. Hyperlipidemia, previous tobacco abuse. EXAM: BILATERAL CAROTID DUPLEX ULTRASOUND TECHNIQUE: Wallace Cullens scale imaging, color Doppler and duplex ultrasound was performed of bilateral carotid and vertebral arteries in the neck. COMPARISON:  None. TECHNIQUE: Quantification of carotid stenosis is based on velocity parameters that correlate the residual internal carotid diameter with NASCET-based stenosis levels, using the diameter of the distal internal carotid lumen as the denominator for stenosis measurement. The following velocity measurements were obtained: PEAK SYSTOLIC/END DIASTOLIC RIGHT ICA:                     134/14cm/sec CCA:                     89/10cm/sec SYSTOLIC ICA/CCA RATIO:  1.5 DIASTOLIC ICA/CCA RATIO: 1.36 ECA:  102cm/sec LEFT ICA:                     135/13cm/sec CCA:                     128/12cm/sec SYSTOLIC ICA/CCA RATIO:  1.06 DIASTOLIC ICA/CCA RATIO: 1.07 ECA:                     136cm/sec FINDINGS: RIGHT CAROTID ARTERY: Mild plaque through the common carotid artery. Mild eccentric plaque, partially calcified, in the bulb and proximal ICA. No high-grade stenosis. Normal waveforms and color Doppler signal. Distal ICA tortuous. RIGHT VERTEBRAL ARTERY:  Normal flow direction and waveform. LEFT CAROTID ARTERY: Circumferential mild plaque in the carotid bulb, proximal ICA, and proximal ECA resulting in at least mild stenosis. Normal waveforms and color Doppler signal. LEFT VERTEBRAL ARTERY: Normal flow direction and waveform. IMPRESSION: 1. Bilateral carotid bifurcation and proximal ICA plaque resulting in less than 50% diameter stenosis. 2.  Antegrade bilateral vertebral arterial flow. Electronically Signed   By: Corlis Leak M.D.   On: 10/29/2017 15:27   Dg Chest Port 1 View  Result Date: 10/29/2017 CLINICAL DATA:  Altered mental status, weakness EXAM: PORTABLE CHEST 1  VIEW COMPARISON:  Chest x-ray of March 02, 2007 FINDINGS: The lungs are adequately inflated and clear. The heart and pulmonary vascularity are normal. The mediastinum is normal in width. There is faint calcification in the wall of the thoracic aorta. The bony thorax exhibits no acute abnormality. IMPRESSION: There is no active cardiopulmonary disease. Thoracic aortic atherosclerosis. Electronically Signed   By: David  Swaziland M.D.   On: 10/29/2017 12:27   Dg Abd 2 Views  Result Date: 10/29/2017 CLINICAL DATA:  Vomiting, weakness, and abdominal pain. EXAM: ABDOMEN - 2 VIEW COMPARISON:  A report of an abdominal and pelvic CT scan of October 06, 2003 FINDINGS: The bowel gas pattern is normal. There is multilevel degenerative disc disease of the lumbar spine. There is faint calcification that projects over the lower pole of the left kidney. This measures approximately 5 x 8 mm. IMPRESSION: Possible left-sided kidney stone.  No definite ureteral stones. Normal bowel gas pattern. Electronically Signed   By: David  [place]and M.D.   On: 10/29/2017 15:49    Assessment: 72 y.o. female presenting with altered mental status and recent infarct with residual right sided weakness.  MRI of the brain reviewed and reveals multiple punctate lacunar infarcts in the right internal/external capsule and posterior left external capsule.  Subacute infarct also noted in the left corona radiata with remote cerebellar and pontine infarcts.  Patient with a history of atrial fibrillation.  Infarcts likely embolic in etiology.  Patient on no antiplatelet or anticoagulation therapy at home.  Carotid dopplers show no evidence of hemodynamically significant stenosis.  Echocardiogram pending.    Stroke Risk Factors - atrial fibrillation, hypertension and smoking  Plan: 1. HgbA1c, fasting lipid panel 2. PT consult, OT consult, Speech consult 3. Echocardiogram pending 4. Prophylactic therapy-Patient is a candidate for anticoagulation due to  stroke and history of atrial fibrillation but there appear to be major issues with compliance.  If patient is to return home would not start anticoagulation but place on ASA 325mg  daily.  If going to a facility patient would benefit from starting anticoagulation.   5. NPO until RN stroke swallow screen 6. Telemetry monitoring 7. Frequent neuro checks 8. Smoking cessation counseling   Thana Farr, MD Neurology [phone] 10/30/2017, 12:10 PM

## 2017-10-30 NOTE — Care Management Note (Signed)
Case Management Note  Patient Details  Name: Destiny BeringLinda M Christon MRN: [id number] Date of Birth: Feb 08, 1946  Subjective/Objective:   Admitted to [facility] with the diagnosis of acute encephalopathy.  Hx of stroke about a month ago. Lives with daughter, Clement SayresKatherine Cox [phone]). Last seen Dr, Hessie DienerBender about a month ago. [facility]. [facility] is currently in the home with skilled nursing., physical therapy, occupational therapy, aide, and Child psychotherapistsocial worker. No skilled nursing. Wheelchair, and bedside commode in the home. Would like a hospital bed. Can only eat with a spoon, needs help with baths and dressing. Up walking prior to stroke. Slid to the floor about a month ago. Eats small meals.  All conversation was per telephone call to daughter. Ms. Reino KentHood has dementia.                Action/Plan: Physical therapy evaluation completed. Recommending Inpatient Acute Rehab.  Discussed this recommendation with daughter. States she has no transportation, would like her mother to go to a local skilled nursing facility. Preference is [name].    Expected Discharge Date:                  Expected Discharge Plan:     In-House Referral:   yes  Discharge planning Services  CM Consult  Post Acute Care Choice:   Choice offered to:     DME Arranged:   DME Agency:    HH Arranged:    HH Agency:     Status of Service:  In process, will continue to follow  If discussed at Long Length of Stay Meetings, dates discussed:    Additional Comments:  Gwenette GreetBrenda S Sakshi Sermons, RN MSN CCM Care Management [phone] 10/30/2017, 10:33 AM

## 2017-10-30 NOTE — Clinical Social Work Placement (Signed)
   CLINICAL SOCIAL WORK PLACEMENT  NOTE  Date:  10/30/2017  Patient Details  Name: Destiny Mack MRN: [id number] Date of Birth: 1945-12-07  Clinical Social Work is seeking post-discharge placement for this patient at the Skilled  Nursing Facility level of care (*CSW will initial, date and re-position this form in  chart as items are completed):  Yes   Patient/family provided with [facility] Clinical Social Work Department's list of facilities offering this level of care within the geographic area requested by the patient (or if unable, by the patient's family).  Yes   Patient/family informed of their freedom to choose among providers that offer the needed level of care, that participate in Medicare, Medicaid or managed care program needed by the patient, have an available bed and are willing to accept the patient.  Yes   Patient/family informed of [facility]'s ownership interest in [facility] and [facility], as well as of the fact that they are under no obligation to receive care at these facilities.  PASRR submitted to EDS on 10/30/17     PASRR number received on 10/30/17     Existing PASRR number confirmed on       FL2 transmitted to all facilities in geographic area requested by pt/family on 10/30/17     FL2 transmitted to all facilities within larger geographic area on       Patient informed that his/her managed care company has contracts with or will negotiate with certain facilities, including the following:        Yes   Patient/family informed of bed offers received.  Patient chooses bed at [name]     Physician recommends and patient chooses bed at      Patient to be transferred to   on  .  Patient to be transferred to facility by       Patient family notified on   of transfer.  Name of family member notified:        PHYSICIAN       Additional Comment:    _______________________________________________ Jovi Alvizo, Darleen CrockerBailey M, LCSW 10/30/2017,  3:03 PM

## 2017-10-30 NOTE — Progress Notes (Signed)
[facility] [place] [facility]   PATIENT NAME: Destiny CharonLinda Call    MR#:  [id number]  DATE OF BIRTH:  April 29, 1946  SUBJECTIVE:  CHIEF COMPLAINT:   Chief Complaint  Patient presents with  . Altered Mental Status     Brought with altered mental status.  She had a stroke a few months ago but she refused to come to emergency room. Was very confused for last 2 days and noted to have UTI and acute stroke in hospital.  REVIEW OF SYSTEMS:   Patient is not very well oriented and not able to review of system.  ROS  DRUG ALLERGIES:  No Known Allergies  VITALS:  Blood pressure 119/83, pulse 85, temperature 99.6 F (37.6 C), temperature source Oral, resp. rate 16, height 5\' 4"  (1.626 m), weight 72.6 kg (160 lb), SpO2 95 %.  PHYSICAL EXAMINATION:  GENERAL:  72 y.o.-year-old patient lying in the bed with no acute distress.  EYES: Pupils equal, round, reactive to light and accommodation. No scleral icterus. Extraocular muscles intact.  HEENT: Head atraumatic, normocephalic. Oropharynx and nasopharynx clear.  NECK:  Supple, no jugular venous distention. No thyroid enlargement, no tenderness.  LUNGS: Normal breath sounds bilaterally, no wheezing, rales,rhonchi or crepitation. No use of accessory muscles of respiration.  CARDIOVASCULAR: S1, S2 normal. No murmurs, rubs, or gallops.  ABDOMEN: Soft, nontender, nondistended. Bowel sounds present. No organomegaly or mass.  EXTREMITIES: No pedal edema, cyanosis, or clubbing.  NEUROLOGIC: Cranial nerves II through XII are intact. Muscle strength 3-4/5 in all extremities. Sensation intact. Gait not checked.  PSYCHIATRIC: The patient is alert and oriented x 1.  SKIN: No obvious rash, lesion, or ulcer.   Physical Exam LABORATORY PANEL:   CBC Recent Labs  Lab 10/30/17 0626  WBC 5.9  HGB 12.9  HCT 39.4  PLT 109*    ------------------------------------------------------------------------------------------------------------------  Chemistries  Recent Labs  Lab 10/29/17 1112 10/30/17 0626  NA 140 142  K 4.2 4.2  CL 107 112*  CO2 20* 20*  GLUCOSE 108* 90  BUN 26* 26*  CREATININE 1.49* 1.58*  CALCIUM 9.4 8.7*  AST 26  --   ALT 11*  --   ALKPHOS 91  --   BILITOT 0.9  --    ------------------------------------------------------------------------------------------------------------------  Cardiac Enzymes Recent Labs  Lab 10/29/17 1112  TROPONINI 0.03*   ------------------------------------------------------------------------------------------------------------------  RADIOLOGY:  Ct Head Wo Contrast  Result Date: 10/29/2017 CLINICAL DATA:  Muscle weakness EXAM: CT HEAD WITHOUT CONTRAST TECHNIQUE: Contiguous axial images were obtained from the base of the skull through the vertex without intravenous contrast. COMPARISON:  None. FINDINGS: Brain: There is atrophy and chronic small vessel disease changes. No acute intracranial abnormality. Specifically, no hemorrhage, hydrocephalus, mass lesion, acute infarction, or significant intracranial injury. Vascular: No hyperdense vessel or unexpected calcification. Skull: No acute calvarial abnormality. Sinuses/Orbits: Visualized paranasal sinuses and mastoids clear. Orbital soft tissues unremarkable. Other: None IMPRESSION: No acute intracranial abnormality. Atrophy, chronic microvascular disease. Electronically Signed   By: Charlett NoseKevin  Dover M.D.   On: 10/29/2017 12:12   Mr Brain Wo Contrast  Result Date: 10/29/2017 CLINICAL DATA:  Focal neuro deficit for greater than 6 hours. Known CVA 3 4 months ago. Right-sided weakness. Incontinence. Altered mental status beginning 2 days ago. EXAM: MRI HEAD WITHOUT CONTRAST TECHNIQUE: Multiplanar, multiecho pulse sequences of the brain and surrounding structures were obtained without intravenous contrast. COMPARISON:  CT  head without contrast from the same day. FINDINGS: Brain: Diffusion-weighted images demonstrate acute infarcts involving the genu  and posterior limb of the right internal capsule as well as the anterior right external capsule. There is a focal punctate area of restricted diffusion involving the left external capsule. No additional focus of signal hyperintensity on the trace diffusion images in the left corona radiata is isointense on the ADC map. This suggests a more subacute infarct. The T2 and FLAIR sequences demonstrate extensive white matter disease and volume loss within the corona radiata bilaterally. A remote lacunar infarct is present in the anterior right lentiform nucleus. Extensive subcortical white matter changes are noted along the insula bilaterally. Extensive volume loss is present bilaterally. White matter changes extend into the brainstem. A remote lacunar infarct is present in the left paramedian pons. Remote lacunar infarcts are present in the left cerebellum. No hemorrhage or mass lesion is present. Ventricles are proportionate to the degree of atrophy. No significant extra-axial fluid collection is present. Vascular: Flow is present in the major intracranial arteries. Skull and upper cervical spine: The skull base is within normal limits. The upper cervical spine demonstrates multilevel endplate change which appears to be effacing the ventral CSF. Marrow signal is normal. Midline sagittal structures are otherwise unremarkable. Sinuses/Orbits: The paranasal sinuses are clear. There is some fluid in the mastoid air cells bilaterally. Globes and orbits are within normal limits. IMPRESSION: 1. Multiple punctate acute nonhemorrhagic lacunar infarcts involving the right internal capsule, right external capsule, and posterior left external capsule. These infarcts are all within the perforator territories. 2. Subacute nonhemorrhagic lacunar infarct within the left corona radiata. 3. Advanced atrophy and  white matter disease consistent with chronic microvascular ischemia. 4. Remote lacunar infarcts involving the left paramedian pons and left cerebellum. 5. Minimal bilateral mastoid effusions. No obstructing nasopharyngeal lesion is present. These results will be called to the ordering clinician or representative by the Radiologist Assistant, and communication documented in the PACS or zVision Dashboard. Electronically Signed   By: Marin Roberts M.D.   On: 10/29/2017 14:19   US Carotid Bilateral  Result Date: 10/29/2017 CLINICAL DATA:  Stroke, unresponsive. Hyperlipidemia, previous tobacco abuse. EXAM: BILATERAL CAROTID DUPLEX ULTRASOUND TECHNIQUE: Wallace Cullens scale imaging, color Doppler and duplex ultrasound was performed of bilateral carotid and vertebral arteries in the neck. COMPARISON:  None. TECHNIQUE: Quantification of carotid stenosis is based on velocity parameters that correlate the residual internal carotid diameter with NASCET-based stenosis levels, using the diameter of the distal internal carotid lumen as the denominator for stenosis measurement. The following velocity measurements were obtained: PEAK SYSTOLIC/END DIASTOLIC RIGHT ICA:                     134/14cm/sec CCA:                     89/10cm/sec SYSTOLIC ICA/CCA RATIO:  1.5 DIASTOLIC ICA/CCA RATIO: 1.36 ECA:                     102cm/sec LEFT ICA:                     135/13cm/sec CCA:                     128/12cm/sec SYSTOLIC ICA/CCA RATIO:  1.06 DIASTOLIC ICA/CCA RATIO: 1.07 ECA:                     136cm/sec FINDINGS: RIGHT CAROTID ARTERY: Mild plaque through the common carotid artery. Mild eccentric plaque, partially calcified, in the bulb and  proximal ICA. No high-grade stenosis. Normal waveforms and color Doppler signal. Distal ICA tortuous. RIGHT VERTEBRAL ARTERY:  Normal flow direction and waveform. LEFT CAROTID ARTERY: Circumferential mild plaque in the carotid bulb, proximal ICA, and proximal ECA resulting in at least mild stenosis.  Normal waveforms and color Doppler signal. LEFT VERTEBRAL ARTERY: Normal flow direction and waveform. IMPRESSION: 1. Bilateral carotid bifurcation and proximal ICA plaque resulting in less than 50% diameter stenosis. 2.  Antegrade bilateral vertebral arterial flow. Electronically Signed   By: Corlis Leak M.D.   On: 10/29/2017 15:27   Dg Chest Port 1 View  Result Date: 10/29/2017 CLINICAL DATA:  Altered mental status, weakness EXAM: PORTABLE CHEST 1 VIEW COMPARISON:  Chest x-ray of March 02, 2007 FINDINGS: The lungs are adequately inflated and clear. The heart and pulmonary vascularity are normal. The mediastinum is normal in width. There is faint calcification in the wall of the thoracic aorta. The bony thorax exhibits no acute abnormality. IMPRESSION: There is no active cardiopulmonary disease. Thoracic aortic atherosclerosis. Electronically Signed   By: David  Swaziland M.D.   On: 10/29/2017 12:27   Dg Abd 2 Views  Result Date: 10/29/2017 CLINICAL DATA:  Vomiting, weakness, and abdominal pain. EXAM: ABDOMEN - 2 VIEW COMPARISON:  A report of an abdominal and pelvic CT scan of October 06, 2003 FINDINGS: The bowel gas pattern is normal. There is multilevel degenerative disc disease of the lumbar spine. There is faint calcification that projects over the lower pole of the left kidney. This measures approximately 5 x 8 mm. IMPRESSION: Possible left-sided kidney stone.  No definite ureteral stones. Normal bowel gas pattern. Electronically Signed   By: David  Swaziland M.D.   On: 10/29/2017 15:49    ASSESSMENT AND PLAN:   Active Problems:   Acute encephalopathy   Pressure injury of skin  1.  Acute encephalopathy with acute cystitis.  IV fluid hydration and IV Rocephin.  Urine culture pending. 2.  acute stroke with right-sided weakness and never had a workup.    MRI of the brain, carotid ultrasound and echocardiogram and monitor on telemetry.  Physical therapy occupational therapy and speech therapy evaluations.    ASA, atorvastatin.  Appreciated help by neurology. 3.  Nausea vomiting.  Start Zofran.  Abdominal x-ray. No acute findings. 4.  Tachycardia.  Give IV fluid hydration.  PRN IV metoprolol.  Switched Norvasc over to Cardizem CD. 5.  Accelerated hypertension.  Restart lisinopril and use Cardizem CD. 6.  Hyperlipidemia unspecified on Lipitor  7.  Lactic acidosis likely from acute cystitis 8.  Acute kidney injury.  IV fluid hydration.   All the records are reviewed and case discussed with Care Management/Social Workerr. Management plans discussed with the patient, family and they are in agreement.  CODE STATUS: full.  TOTAL TIME TAKING CARE OF THIS PATIENT: 35 minutes.  Patient's daughter is in the room during my visit.   POSSIBLE D/C IN 2-3 DAYS, DEPENDING ON CLINICAL CONDITION.   Altamese Dilling M.D on 10/30/2017   Between 7am to 6pm - Pager - [phone]  After 6pm go to [email or web address] - password [email or web address]  [facility]  Office  [phone]  CC: Primary care physician; Patient, No Pcp Per  Note: This dictation was prepared with Dragon dictation along with smaller phrase technology. Any transcriptional errors that result from this process are unintentional.

## 2017-10-30 NOTE — Progress Notes (Signed)
*  PRELIMINARY RESULTS* Echocardiogram 2D Echocardiogram has been performed.  Destiny GulaJoan M Khoi Mack 10/30/2017, 8:13 AM

## 2017-10-30 NOTE — Progress Notes (Signed)
OT Cancellation Note  Patient Details Name: Roselyn BeringLinda M Pfenning MRN: [id number] DOB: 03-27-1946   Cancelled Treatment:    Reason Eval/Treat Not Completed: Pt.  with elevated troponin, and lactic acid values. Will continue to monitor and intervene as appropriate.  Olegario MessierElaine Maddix Heinz, MS, OTR/L 10/30/2017, 3:44 PM

## 2017-10-30 NOTE — NC FL2 (Signed)
[place] MEDICAID FL2 LEVEL OF CARE SCREENING TOOL     IDENTIFICATION  Patient Name: Destiny BeringLinda M Ewing Birthdate: 1945/09/21 Sex: female Admission Date (Current Location): 10/29/2017  [place]   Facility and Address:  [facility], [address], [place], [place] [address]      Provider Number: [id number]  Attending Physician Name and Address:  Altamese DillingVachhani, Vaibhavkumar, *  Relative Name and Phone Number:       Current Level of Care: Hospital Recommended Level of Care: Skilled Nursing Facility Prior Approval Number:    Date Approved/Denied:   PASRR Number: [id number])  Discharge Plan: SNF    Current Diagnoses: Patient Active Problem List   Diagnosis Date Noted  . Acute encephalopathy 10/29/2017  . Pressure injury of skin 10/29/2017    Orientation RESPIRATION BLADDER Height & Weight     Self  Normal Incontinent Weight: 160 lb (72.6 kg) Height:  5\' 4"  (162.6 cm)  BEHAVIORAL SYMPTOMS/MOOD NEUROLOGICAL BOWEL NUTRITION STATUS      Continent Diet(Diet: DYS 3 )  AMBULATORY STATUS COMMUNICATION OF NEEDS Skin   Extensive Assist Verbally PU Stage and Appropriate Care(pressure ulcer stage 1 on coccyx. )                       Personal Care Assistance Level of Assistance  Bathing, Feeding, Dressing Bathing Assistance: Limited assistance Feeding assistance: Independent Dressing Assistance: Limited assistance     Functional Limitations Info  Sight, Hearing, Speech Sight Info: Adequate Hearing Info: Adequate Speech Info: Adequate    SPECIAL CARE FACTORS FREQUENCY  PT (By licensed PT), OT (By licensed OT)     PT Frequency: (5) OT Frequency: (5)            Contractures      Additional Factors Info  Code Status, Allergies Code Status Info: (Full Code. ) Allergies Info: (No Known Allergies. )           Current Medications (10/30/2017):  This is the current hospital active medication list Current  Facility-Administered Medications  Medication Dose Route Frequency Provider Last Rate Last Dose  . 0.9 %  sodium chloride infusion   Intravenous Continuous Alford HighlandWieting, Richard, MD 50 mL/hr at 10/30/17 [phone]    . acetaminophen (TYLENOL) tablet 650 mg  650 mg Oral Q6H PRN Alford HighlandWieting, Richard, MD       Or  . acetaminophen (TYLENOL) suppository 650 mg  650 mg Rectal Q6H PRN Wieting, Richard, MD      . aspirin suppository 300 mg  300 mg Rectal Daily PRN Renae GlossWieting, Richard, MD      . atorvastatin (LIPITOR) tablet 40 mg  40 mg Oral q1800 Wieting, Richard, MD      . cefTRIAXone (ROCEPHIN) 1 g in sodium chloride 0.9 % 100 mL IVPB  1 g Intravenous Q24H Hallaji, Sheema M, RPH 200 mL/hr at 10/30/17 0938 1 g at 10/30/17 0938  . enoxaparin (LOVENOX) injection 40 mg  40 mg Subcutaneous Q24H Alford HighlandWieting, Richard, MD   40 mg at 10/29/17 2231  . metoprolol tartrate (LOPRESSOR) injection 5 mg  5 mg Intravenous Q1H PRN Wieting, Richard, MD      . nystatin (MYCOSTATIN/NYSTOP) topical powder   Topical BID Wieting, Richard, MD      . ondansetron [facility](ZOFRAN) injection 4 mg  4 mg Intravenous Q6H PRN Alford HighlandWieting, Richard, MD   4 mg at 10/29/17 1304     Discharge Medications: Please see discharge summary for a list of  discharge medications.  Relevant Imaging Results:  Relevant Lab Results:   Additional Information (SSN: [id number])  Makel Mcmann, Darleen Crocker, LCSW

## 2017-10-30 NOTE — Progress Notes (Addendum)
[facility] inpatient rehab admissions - noted PT recommending CIR.  I will follow up with case manager about potential for inpatient rehab admission once all medical workup is completed.  Call me for questions.  #[phone]  I spoke with Terrilee CroakBrenda Holland, CM and the patient/family prefer a local SNF rather than [facility] inpatient rehab.  #[phone]

## 2017-10-30 NOTE — Evaluation (Signed)
Clinical/Bedside Swallow Evaluation Patient Details  Name: Destiny Mack MRN: [id number] Date of Birth: [birth year]  Today's Date: 10/30/2017 Time: SLP Start Time (ACUTE ONLY): 0820 SLP Stop Time (ACUTE ONLY): 0920 SLP Time Calculation (min) (ACUTE ONLY): 60 min  Past Medical History:  Past Medical History:  Diagnosis Date  . A-fib (HCC)   . Depression   . Frequent falls   . Hypertension   . Leaking of urine   . Mixed incontinence   . Stroke (HCC)   . Tremor of hands and face    Past Surgical History:  Past Surgical History:  Procedure Laterality Date  . TUBAL LIGATION     HPI:  Pt is a 72 y/o F who presented with AMS.  Pt was found to have positive urine analysis. Pt with known h/o stroke 3-4 months ago when she refused to come to the hospital.  MRI showed: "Multiple punctate acute nonhemorrhagic lacunar infarcts involving the right internal capsule, right external capsule, and posterior left external capsule. These infarcts are all within the perforator territories.  Subacute nonhemorrhagic lacunar infarct within the left corona radiata.  Advanced atrophy and white matter disease consistent with chronic microvascular ischemia.  Remote lacunar infarcts involving the left paramedian pons and left cerebellum.  Minimal bilateral mastoid effusions.  No obstructing nasopharyngeal lesion is present."  Pt's PMH includes stroke, afib, HTN, depression, tremor of hands and face, frequent falls, incontinence. Patient has had right sided weakness, incontinence and been unable to walk since her stroke a few months ago.  She has not been eating per report.  Currently, pt is awake, verbally conversive but Echolalia noted. Pt was able to follow through w/ basic commands w/ moderate+ cues; answered basic questions re: self w/ fair consistency and accuracy - sometimes a F:2 offered for improved accuracy.    Assessment / Plan / Recommendation Clinical Impression  Pt appears to present w/ adequate  oropharyngeal phase swallow function w/ no overt s/s of aspiration noted during po trials at this evaluation. Pt is at reduced risk for aspiration following general aspiration precautions. Pt consumed po trials of thin liquids, nectar liquids, purees. and soft solids w/ no overt s/s of aspiration; no decline in vocal quality or respiratory status during/post trials. [facility]'s [facility] c/b adequate bolus management and timely A-P transfer and appropriate oral clearing post trials. Pt required min increased time for mastication of increased texture/solids. Pt assisted w/ tray setup and positioning; reducing distractions. Recommend a dysphagia level 3 w/ thin liquids(meats cut small); general aspiration precautions; Pills in Puree for safer, easier swallowing d/t Cognitive status. NSG to reconsult ST services if any decline in swallowing status noted while admitted. ST services will f/u w/ Cognitive-linguistic assessment d/t decline in status.  SLP Visit Diagnosis: Dysphagia, oral phase (R13.11)    Aspiration Risk  (reduced )    Diet Recommendation  Dysphagia level 3(mech soft d/t UEs, dentures); Thin liquids. General aspiration precautions; assistance at meals d/t tremors in UEs.   Medication Administration: Whole meds with puree(for safer swallowing)    Other  Recommendations Recommended Consults: (none at this time) Oral Care Recommendations: Oral care BID;Staff/trained caregiver to provide oral care Other Recommendations: (n/a)   Follow up Recommendations (Cognitive-linguistic assessment)      Frequency and Duration (n/a for swallowing)  (n/a for swallowing)       Prognosis Prognosis for Safe Diet Advancement: Good Barriers to Reach Goals: Cognitive deficits;Language deficits      Swallow Study  General Date of Onset: 10/29/17 HPI: Pt is a 72 y/o F who presented with AMS.  Pt was found to have positive urine analysis. Pt with known h/o stroke 3-4 months ago when she refused to  come to the hospital.  MRI showed: "Multiple punctate acute nonhemorrhagic lacunar infarcts involving the right internal capsule, right external capsule, and posterior left external capsule. These infarcts are all within the perforator territories.  Subacute nonhemorrhagic lacunar infarct within the left corona radiata.  Advanced atrophy and white matter disease consistent with chronic microvascular ischemia.  Remote lacunar infarcts involving the left paramedian pons and left cerebellum.  Minimal bilateral mastoid effusions.  No obstructing nasopharyngeal lesion is present."  Pt's PMH includes stroke, afib, HTN, depression, tremor of hands and face, frequent falls, incontinence. Patient has had right sided weakness, incontinence and been unable to walk since her stroke a few months ago.  She has not been eating per report.  Currently, pt is awake, verbally conversive but Echolalia noted. Pt was able to follow through w/ basic commands w/ moderate+ cues; answered basic questions re: self w/ fair consistency and accuracy - sometimes a F:2 offered for improved accuracy.  Type of Study: Bedside Swallow Evaluation Previous Swallow Assessment: none reported Diet Prior to this Study: NPO(pt indicated she ate a regular diet at home) Temperature Spikes Noted: (temp 99.6; wbc 5.9) Respiratory Status: Room air History of Recent Intubation: No Behavior/Cognition: Alert;Cooperative;Pleasant mood;Confused;Distractible;Requires cueing Oral Cavity Assessment: Dry(sticky) Oral Care Completed by SLP: Yes Oral Cavity - Dentition: Dentures, top;Dentures, bottom Vision: Functional for self-feeding Self-Feeding Abilities: Able to feed self;Needs assist;Needs set up;Total assist(tremors in UEs baseline) Patient Positioning: Upright in bed Baseline Vocal Quality: Normal Volitional Cough: Strong Volitional Swallow: Able to elicit    Oral/Motor/Sensory Function Overall Oral Motor/Sensory Function: Within functional limits    Ice Chips Ice chips: Within functional limits Presentation: Spoon(fed; 3 trials)   Thin Liquid Thin Liquid: Within functional limits Presentation: Cup;Self Fed;Straw(supported; ~6-8 ozs total)    Nectar Thick Nectar Thick Liquid: Within functional limits Presentation: Cup;Self Fed;Straw(supported; ~3 ozs)   Honey Thick Honey Thick Liquid: Not tested   Puree Puree: Within functional limits Presentation: Spoon(fed; 3-4 ozs)   Solid   GO   Solid: Impaired Presentation: Spoon(fed; 5 trials) Oral Phase Impairments: Impaired mastication(min increased time) Oral Phase Functional Implications: Impaired mastication(min increased time) Pharyngeal Phase Impairments: (none) Other Comments: w/ U/L dentures        Jerilynn Som, MS, CCC-SLP Gerrie Castiglia 10/30/2017,4:01 PM

## 2017-10-30 NOTE — Evaluation (Addendum)
Physical Therapy Evaluation Patient Details Name: Destiny Mack MRN: [id number] DOB: [birth year] Today's Date: 10/30/2017   History of Present Illness  Pt is a 72 y/o F who presented with AMS.  Pt was found to have positive urine analysis. Pt with known h/o stroke 3-4 months ago when she refused to come to the hospital.  MRI showed: "Multiple punctate acute nonhemorrhagic lacunar infarcts involving the right internal capsule, right external capsule, and posterior left external capsule. These infarcts are all within the perforator territories.  Subacute nonhemorrhagic lacunar infarct within the left corona radiata.  Advanced atrophy and white matter disease consistent with chronic microvascular ischemia.  Remote lacunar infarcts involving the left paramedian pons and left cerebellum.  Minimal bilateral mastoid effusions.  No obstructing nasopharyngeal lesion is present."  Pt's PMH includes stroke, tremor of hands and face, incontinence.     Clinical Impression  Pt admitted with above diagnosis. Pt currently with functional limitations due to the deficits listed below (see PT Problem List). Unclear of pt's baseline level of mobility or amount of assist available as pt is a poor historian and no family present during evaluation.  However, pt reports that she lives with her daughter who provides 24/7 care.  She also reports assist from her grandson for transfers and says she has a caregiver that comes on Wednesdays for one hour. Pt currently requires mod assist for sit<>stand and stand pivot transfers due to posterior lean and difficulty with sequencing and motor planning. Recommend CIR at d/c. Pt will benefit from skilled PT to increase their independence and safety with mobility to allow discharge to the venue listed below.      Follow Up Recommendations CIR    Equipment Recommendations  Rolling walker with 5" wheels    Recommendations for Other Services OT consult     Precautions / Restrictions  Precautions Precautions: Fall;Other (comment) Precaution Comments: monitor HR Restrictions Weight Bearing Restrictions: No      Mobility  Bed Mobility Overal bed mobility: Needs Assistance Bed Mobility: Supine to Sit     Supine to sit: HOB elevated;Min assist     General bed mobility comments: Pt initiates supine>sit without use of bed rail but HOB elevated.  Pt able to complete most of scooting to EOB but ultimately does require assist with this.  Good trunk control demonstrated.   Transfers Overall transfer level: Needs assistance Equipment used: Rolling walker (2 wheeled) Transfers: Sit to/from [employer] Sit to Stand: Mod assist Stand pivot transfers: Mod assist       General transfer comment: Cues and hand over hand assist for proper hand placement but pt returns hands to RW before standing.  Assist to boost to standing as pt lacks appropriate forward trunk lean and demonstrates posterior lean with dec Bil DF. Pt requires mod assist for controlled descent to sit. Pt saying, "and then we plop" when preparing to sit down.  When pivoting the pt requires mod assist to remain steady due to posterior lean and no sign of correction when assist slightly lifted.  Pt requires assist managing RW and verbal cues for sequencing and stepping with each foot.   Ambulation/Gait             General Gait Details: Unable to assess at this time.   Stairs            Wheelchair Mobility    Modified Rankin (Stroke Patients Only)       Balance Overall balance assessment: Needs assistance Sitting-balance  support: No upper extremity supported;Feet supported Sitting balance-Leahy Scale: Fair     Standing balance support: Bilateral upper extremity supported;During functional activity Standing balance-Leahy Scale: Poor Standing balance comment: Pt relies on UE support and outside physical assist for static and dynamic activities                              Pertinent Vitals/Pain Pain Assessment: No/denies pain(and no signs of pain)    Home Living Family/patient expects to be discharged to:: Private residence Living Arrangements: Children(daughter) Available Help at Discharge: Family;Available 24 hours/day Type of Home: Apartment Home Access: Stairs to enter   Entrance Stairs-Number of Steps: (Pt unable to provide information on how many steps)   Home Equipment: Bedside commode;Wheelchair - manual Additional Comments: Information not complete as no family present to provide information and pt is a poor historian.  At times she answers questions clearly and other time she does not respond at all despite re-wording of question. Pt did mention a caregiver who she says comes on Wednesdays.     Prior Function Level of Independence: Needs assistance   Gait / Transfers Assistance Needed: Limited information but chart review suggests that pt has been nonambulatory since her recent stroke. Pt says, "Melanee Spry usually helps me" when this PT is assisting pt to chair.  She reports her daughter assists with transfers to [facility] at home.   ADL's / Homemaking Assistance Needed: Pt likely requires assist with most ADLs.          Hand Dominance        Extremity/Trunk Assessment   Upper Extremity Assessment Upper Extremity Assessment: RUE deficits/detail;LUE deficits/detail;Difficult to assess due to impaired cognition RUE Deficits / Details: Unable to formally assess as pt not consistently following commands.  Grip strength appears ~3/5, otherwise strength grossly 2+/5.   RUE Sensation: (Pt reports her RUE feels "different" to light touch ) LUE Deficits / Details: Strength grossly 3/5    Lower Extremity Assessment Lower Extremity Assessment: RLE deficits/detail;LLE deficits/detail;Difficult to assess due to impaired cognition RLE Deficits / Details: Unable to formally assess as pt not consistently following commands. Hip F 3-/5, knee extension and  flexion 3/5.  DF ROM limited to ~(-) 10 deg.  RLE Sensation: (Pt reports her RLE feels "different" to light touch ) LLE Deficits / Details: Strength grossly 3/5       Communication   Communication: Expressive difficulties  Cognition Arousal/Alertness: Awake/alert Behavior During Therapy: WFL for tasks assessed/performed Overall Cognitive Status: No family/caregiver present to determine baseline cognitive functioning Area of Impairment: Safety/judgement;Orientation;Attention;Memory;Following commands;Awareness;Problem solving                 Orientation Level: Disoriented to;Situation;Time Current Attention Level: Selective Memory: Decreased short-term memory Following Commands: Follows one step commands inconsistently Safety/Judgement: Decreased awareness of deficits;Decreased awareness of safety Awareness: Intellectual Problem Solving: Slow processing;Decreased initiation;Difficulty sequencing;Requires verbal cues;Requires tactile cues General Comments: Pt always moves her RLE when asked to move her LLE.  She follows one step commands at time but other times does not follow commands even after repeated and re-worded.  Pt with delay and requires cues and physical assist to initiate.       General Comments General comments (skin integrity, edema, etc.): HR up to 137 with stand pivot transfer.  Down to 90s once resting.     Exercises General Exercises - Lower Extremity Ankle Circles/Pumps: AROM;Both;10 reps;Supine Hip Flexion/Marching: Both;10 reps;Seated   Assessment/Plan  PT Assessment Patient needs continued PT services  PT Problem List Decreased strength;Decreased activity tolerance;Decreased range of motion;Decreased mobility;Decreased balance;Decreased cognition;Decreased knowledge of use of DME;Decreased safety awareness;Impaired sensation       PT Treatment Interventions DME instruction;Gait training;Stair training;Functional mobility training;Therapeutic  activities;Therapeutic exercise;Balance training;Neuromuscular re-education;Cognitive remediation;Patient/family education;Wheelchair mobility training    PT Goals (Current goals can be found in the Care Plan section)  Acute Rehab PT Goals Patient Stated Goal: to receive rehab at d/c PT Goal Formulation: With patient Time For Goal Achievement: 11/13/17 Potential to Achieve Goals: Good    Frequency 7X/week   Barriers to discharge Other (comment) Unsure of home layout or amount of assist available at d/c    Co-evaluation               AM-PAC PT "6 Clicks" Daily Activity  Outcome Measure Difficulty turning over in bed (including adjusting bedclothes, sheets and blankets)?: Unable Difficulty moving from lying on back to sitting on the side of the bed? : Unable Difficulty sitting down on and standing up from a chair with arms (e.g., wheelchair, bedside commode, etc,.)?: Unable Help needed moving to and from a bed to chair (including a wheelchair)?: A Lot Help needed walking in hospital room?: A Lot Help needed climbing 3-5 steps with a railing? : Total 6 Click Score: 8    End of Session Equipment Utilized During Treatment: Gait belt Activity Tolerance: Patient tolerated treatment well;Patient limited by fatigue Patient left: in chair;with call bell/phone within reach;with chair alarm set;Other (comment)(phone within reach to call RN) Nurse Communication: Mobility status(no call bell available; phone within reach; HR) PT Visit Diagnosis: Hemiplegia and hemiparesis;Muscle weakness (generalized) (M62.81);Unsteadiness on feet (R26.81);Other abnormalities of gait and mobility (R26.89);Difficulty in walking, not elsewhere classified (R26.2) Hemiplegia - Right/Left: Right Hemiplegia - dominant/non-dominant: (unsure) Hemiplegia - caused by: Cerebral infarction    Time: 1610-9604: 0857-0926 PT Time Calculation (min) (ACUTE ONLY): 29 min   Charges:   PT Evaluation $PT Eval Moderate Complexity:  1 Mod PT Treatments $Therapeutic Activity: 8-22 mins   PT G Codes:        Encarnacion ChuAshley Abashian PT, DPT 10/30/2017, 10:10 AM

## 2017-10-31 LAB — LIPID PANEL
Cholesterol: 128 mg/dL (ref 0–200)
HDL: 37 mg/dL — ABNORMAL LOW
LDL Cholesterol: 75 mg/dL (ref 0–99)
Total CHOL/HDL Ratio: 3.5 ratio
Triglycerides: 81 mg/dL
VLDL: 16 mg/dL (ref 0–40)

## 2017-10-31 LAB — HEMOGLOBIN A1C
HEMOGLOBIN A1C: 5.6 % (ref 4.8–5.6)
MEAN PLASMA GLUCOSE: 114.02 mg/dL

## 2017-10-31 MED ORDER — CEPHALEXIN 500 MG PO CAPS
500.0000 mg | ORAL_CAPSULE | Freq: Two times a day (BID) | ORAL | Status: DC
  Administered 2017-11-01: 500 mg via ORAL
  Filled 2017-10-31: qty 1

## 2017-10-31 NOTE — Care Management Important Message (Signed)
Important Message  Patient Details  Name: Destiny BeringLinda M Mack MRN: [id number] Date of Birth: May 13, 1946   Medicare Important Message Given:  Yes    Gwenette GreetBrenda S Latoy Labriola, RN 10/31/2017, 6:56 AM

## 2017-10-31 NOTE — Progress Notes (Signed)
[employer] [facility] - [place] at [facility]   PATIENT NAME: Destiny Mack    MR#:  [id number]  DATE OF BIRTH:  [birth year]  SUBJECTIVE:  CHIEF COMPLAINT:   Chief Complaint  Patient presents with  . Altered Mental Status     Brought with altered mental status.  She had a stroke a few months ago but she refused to come to emergency room. Was very confused for last 2 days and noted to have UTI and acute stroke in hospital.  Today patient is more alert and she appears to be oriented to her surroundings.  REVIEW OF SYSTEMS:    Review of Systems  Constitutional: Positive for malaise/fatigue. Negative for fever and weight loss.  HENT: Negative for congestion, hearing loss and tinnitus.   Eyes: Negative for blurred vision and double vision.  Respiratory: Negative for cough, sputum production and shortness of breath.   Cardiovascular: Negative for chest pain, palpitations and orthopnea.  Gastrointestinal: Negative for abdominal pain, heartburn, nausea and vomiting.  Genitourinary: Negative for dysuria and frequency.  Musculoskeletal: Negative for back pain and myalgias.  Skin: Negative for rash.  Neurological: Negative for dizziness, tremors, focal weakness and headaches.  Endo/Heme/Allergies: Does not bruise/bleed easily.  Psychiatric/Behavioral: Negative for depression and suicidal ideas.    DRUG ALLERGIES:  No Known Allergies  VITALS:  Blood pressure (!) 159/62, pulse 61, temperature 98 F (36.7 C), temperature source Oral, resp. rate 18, height 5\' 4"  (1.626 m), weight 72.6 kg (160 lb), SpO2 99 %.  PHYSICAL EXAMINATION:  GENERAL:  72 y.o.-year-old patient lying in the bed with no acute distress.  EYES: Pupils equal, round, reactive to light and accommodation. No scleral icterus. Extraocular muscles intact.  HEENT: Head atraumatic, normocephalic. Oropharynx and nasopharynx clear.  NECK:  Supple, no jugular venous distention. No thyroid enlargement, no tenderness.  LUNGS:  Normal breath sounds bilaterally, no wheezing, rales,rhonchi or crepitation. No use of accessory muscles of respiration.  CARDIOVASCULAR: S1, S2 normal. No murmurs, rubs, or gallops.  ABDOMEN: Soft, nontender, nondistended. Bowel sounds present. No organomegaly or mass.  EXTREMITIES: No pedal edema, cyanosis, or clubbing.  NEUROLOGIC: Cranial nerves II through XII are intact. Muscle strength 3-4/5 in all extremities. Sensation intact. Gait not checked.  PSYCHIATRIC: The patient is alert and oriented x 3.  SKIN: No obvious rash, lesion, or ulcer.   Physical Exam LABORATORY PANEL:   CBC Recent Labs  Lab 10/30/17 0626  WBC 5.9  HGB 12.9  HCT 39.4  PLT 109*   ------------------------------------------------------------------------------------------------------------------  Chemistries  Recent Labs  Lab 10/29/17 1112 10/30/17 0626  NA 140 142  K 4.2 4.2  CL 107 112*  CO2 20* 20*  GLUCOSE 108* 90  BUN 26* 26*  CREATININE 1.49* 1.58*  CALCIUM 9.4 8.7*  AST 26  --   ALT 11*  --   ALKPHOS 91  --   BILITOT 0.9  --    ------------------------------------------------------------------------------------------------------------------  Cardiac Enzymes Recent Labs  Lab 10/29/17 1112  TROPONINI 0.03*   ------------------------------------------------------------------------------------------------------------------  RADIOLOGY:  No results found.  ASSESSMENT AND PLAN:   Active Problems:   Acute encephalopathy   Pressure injury of skin  1.  Acute encephalopathy with acute cystitis.   IV fluid hydration and IV Rocephin.  Urine culture negative, will switch to oral and finish total of 5 days course.   Her encephalopathy significantly improved, today she is able to tell me where she is and why, she is also able to identify the year and name the  president. She also told me some details about her living conditions at home and made it clear that she does not want to go to  rehabilitation. 2.  acute stroke with right-sided weakness and never had a workup.    MRI of the brain, carotid ultrasound and echocardiogram and monitor on telemetry.  Physical therapy occupational therapy and speech therapy evaluations.   ASA, atorvastatin.  Appreciated help by neurology.   As per neurology, she is a candidate for anti-coagulation but noncompliant and had imbalance so not very safe. 3.  Nausea vomiting.  Start Zofran.  Abdominal x-ray. No acute findings. 4.  Tachycardia.  Give IV fluid hydration.  PRN IV metoprolol.  Switched Norvasc over to Cardizem CD. 5.  Accelerated hypertension.  Restart lisinopril and use Cardizem CD. 6.  Hyperlipidemia unspecified on Lipitor  7.  Lactic acidosis likely from acute cystitis 8.  Acute kidney injury.  IV fluid hydration.   All the records are reviewed and case discussed with Care Management/Social Workerr. Management plans discussed with the patient, family and they are in agreement.  CODE STATUS: full.  TOTAL TIME TAKING CARE OF THIS PATIENT: 45 minutes.  Physical therapy as suggested discharge to SNF due to patient's weakness and imbalance, but patient is more alert and oriented today and clearly denies going to rehabilitation. Her insurance has asked for peer to peer Review with M.D. before approving her rehabilitation. As patient is not willing to go to rehabilitation, I would speak to her daughter first and try to clarify their final wishes.. I tried calling her daughter 4 times today but she did not pick up the phone and the voicemail is not set on her phone. I informed nurse and case manager to talk to patient's daughter about patient's unwillingness to go to rehabilitation and if she contacts them they can clarify that and encourage her to come to final decision and we can proceed after that on her discharge.   POSSIBLE D/C IN 2-3 DAYS, DEPENDING ON CLINICAL CONDITION.   Altamese DillingVaibhavkumar Rachele Lamaster M.D on 10/31/2017   Between 7am to  6pm - Pager - [phone]  After 6pm go to [email or web address] - password [email or web address]  [facility]  Office  [phone]  CC: Primary care physician; Patient, No Pcp Per  Note: This dictation was prepared with Dragon dictation along with smaller phrase technology. Any transcriptional errors that result from this process are unintentional.

## 2017-10-31 NOTE — Plan of Care (Addendum)
Patient working with PT. Family meeting tomorrow at 11:00

## 2017-10-31 NOTE — Progress Notes (Signed)
[facility] has been denied and there is not an opportunity for a peer to peer. Clinical Social Worker (CSW) contacted patient's daughter [name] and made her aware of above. CSW offered private pay SNF or home with home health. Per daughter patient can't afford to pay privatly for SNF and [facility]. CSW explained how to apply for long term care medicaid. Daughter reported that patient has tried to apply for medicaid but has been denied because she is over the income limit. Per daughter she will take patient home. Daughter requested a hospital bed. CSW sent RN case manager a message making her aware of above.   Plan is for patient to D/C home.  Baker Hughes IncorporatedBailey Caitlin Ainley, LCSW [phone]

## 2017-10-31 NOTE — Progress Notes (Addendum)
OT Cancellation Note  Patient Details Name: Destiny BeringLinda M Tremain MRN: [id number] DOB: 1946/05/04   Cancelled Treatment:    Reason Eval/Treat Not Completed: Other (comment). Upon initial attempt this am, pt eating breakfast with staff. Will re-attempt later time this date as pt is available. On 2nd attempt, MD entering room with NSG to assess. Will re-attempt at later time.  Richrd PrimeJamie Stiller, MPH, MS, OTR/L ascom [phone] 10/31/17, 8:49 AM

## 2017-10-31 NOTE — Progress Notes (Signed)
Physical Therapy Treatment Patient Details Name: Destiny Destiny Mack MRN: [id number] DOB: 1946/04/17 Today's Date: 10/31/2017    History of Present Illness Pt is a 72 y/o F who presented with AMS.  Pt was found to have positive urine analysis. Pt with known h/o stroke 3-4 months ago when she refused to come to the hospital.  MRI showed: "Multiple punctate acute nonhemorrhagic lacunar infarcts involving the right internal capsule, right external capsule, and posterior left external capsule. These infarcts are all within the perforator territories.  Subacute nonhemorrhagic lacunar infarct within the left corona radiata.  Advanced atrophy and white matter disease consistent with chronic microvascular ischemia.  Remote lacunar infarcts involving the left paramedian pons and left cerebellum.  Minimal bilateral mastoid effusions.  No obstructing nasopharyngeal lesion is present."  Pt's PMH includes stroke, tremor of hands and face, incontinence.    PT Comments    Destiny Destiny Mack was agreeable to work with therapy and moved quickly during supine>sit, although requiring min assist to elevate trunk this session.  Once sitting the pt completed therapeutic exercises.  Pt required mod assist for sit>stand due to strong posterior lean with no sign of correction when provided with momentary dec assist. Attempted several different strategies to initiate gait including: target stepping, weight shifting, verbal cues, tactile cues, simply asking the pt to walk or pivot to the chair.  Pt appears to demonstrate apraxia and was unable to initiate stepping.  Pt with strong posterior lean throughout, requiring mod assist to remain standing.  Per chart review the family has refused CIR due to their personal transportation challenges.  If family continues to refuse CIR then recommendation would be for SNF.  If pt refuses SNF then she will need HHPT and equipment including: lift, WC, RW, BSC, hospital bed.    Patient with the above diagnosis  which impairs her ability to perform daily activities like toileting, feeding, dressing, grooming, bathing in the home. A RW will be helpful for progression with HHPT but will not resolve the patient's issue with performing activities of daily living. A wheelchair is required/recommended and will allow patient to safely perform daily activities.   Patient can safely propel the wheelchair in the home or has a caregiver who can provide assistance.     Follow Up Recommendations  CIR;Supervision/Assistance - 24 hour(SNF if family/patient continue to refuse CIR)     Equipment Recommendations  Rolling walker with 5" wheels    Recommendations for Other Services       Precautions / Restrictions Precautions Precautions: Fall;Other (comment) Precaution Comments: monitor HR Restrictions Weight Bearing Restrictions: No    Mobility  Bed Mobility Overal bed mobility: Needs Assistance Bed Mobility: Supine to Sit;Sit to Supine     Supine to sit: HOB elevated;Min assist Sit to supine: Mod assist   General bed mobility comments: Pt initiates supine>sit without use of bed rail but HOB elevated.  Pt able to complete most of scooting to EOB but ultimately does require assist with this.  Pt does require assist to elevate trunk this session with posterior bias.  To return to supine pt requires assist bringing BLEs into bed.   Transfers Overall transfer level: Needs assistance Equipment used: Rolling walker (2 wheeled) Transfers: Sit to/from Stand Sit to Stand: Mod assist         General transfer comment: Verbal and tactile cues for anterior trunk lean as pt has posterior bias.  Hand over hand placement of hands in proper positioning.  Mod assist to boost to  standing and to remain steady as pt with posterior lean WBing through heels only.  Once standing, this PT assisted pt into upright from posterior lean but pt shows no sign of correction when assist slightly decreased.  Pt requires assist for  controlled descent to sit.   Ambulation/Gait             General Gait Details: Attempted several different strategies to initiate gait including: target stepping, weight shifting, verbal cues, tactile cues, simply asking the pt to walk.  Pt appears to demonstrate apraxia and was unable to initiate stepping.  Pt with strong posterior lean throughout requiring mod assist to remain standing.     Stairs            Wheelchair Mobility    Modified Rankin (Stroke Patients Only)       Balance Overall balance assessment: Needs assistance Sitting-balance support: No upper extremity supported;Feet supported Sitting balance-Leahy Scale: Fair     Standing balance support: Bilateral upper extremity supported;During functional activity Standing balance-Leahy Scale: Poor Standing balance comment: Pt relies on UE support and outside physical assist for static standing                            Cognition Arousal/Alertness: Awake/alert Behavior During Therapy: WFL for tasks assessed/performed Overall Cognitive Status: No family/caregiver present to determine baseline cognitive functioning Area of Impairment: Safety/judgement;Orientation;Attention;Memory;Following commands;Awareness;Problem solving                 Orientation Level: Disoriented to;Situation;Time Current Attention Level: Selective Memory: Decreased short-term memory Following Commands: Follows one step commands inconsistently Safety/Judgement: Decreased awareness of deficits;Decreased awareness of safety Awareness: Intellectual Problem Solving: Slow processing;Decreased initiation;Difficulty sequencing;Requires verbal cues;Requires tactile cues General Comments: Pt always moves her RLE when asked to move her LLE.  She follows one step commands at time but other times does not follow commands even after repeated and re-worded.  Pt with delay and requires cues and physical assist to initiate.        Exercises General Exercises - Lower Extremity Long Arc Quad: AROM;Both;10 reps;Seated Hip Flexion/Marching: Both;10 reps;Seated    General Comments        Pertinent Vitals/Pain Pain Assessment: No/denies pain(and no signs of pain)    Home Living Family/patient expects to be discharged to:: Private residence Living Arrangements: Children(daughter, grandson) Available Help at Discharge: Family;Available 24 hours/day Type of Home: Apartment Home Access: Stairs to enter Entrance Stairs-Rails: None Home Layout: Two level Home Equipment: Bedside commode;Wheelchair - manual Additional Comments: No family present to confirm information provided by pt, pt is poor historian    Prior Function Level of Independence: Needs assistance  Gait / Transfers Assistance Needed: Limited information but chart review suggests that pt has been nonambulatory since her recent stroke. Pt says, "Melanee Spryan usually helps me" with transfers.  She reports her [facility]  ADL's / Homemaking Assistance Needed: Pt reports daughter assists with toileting hygiene, bathing, and dressing. Pt reports that dtr/gson won't empty her BSC so she has to take it herself up the flight of stairs "very carefully" to empty it.  Comments: pt endorses several falls in past 12 months   PT Goals (current goals can now be found in the care plan section) Acute Rehab PT Goals Patient Stated Goal: to return home at d/c PT Goal Formulation: With patient Time For Goal Achievement: 11/13/17 Potential to Achieve Goals: Good Progress towards PT goals:  Not progressing toward goals - comment(see notes above)    Frequency    7X/week      PT Plan Current plan remains appropriate    Co-evaluation              AM-PAC PT "6 Clicks" Daily Activity  Outcome Measure  Difficulty turning over in bed (including adjusting bedclothes, sheets and blankets)?: Unable Difficulty moving from lying on back to  sitting on the side of the bed? : Unable Difficulty sitting down on and standing up from a chair with arms (e.g., wheelchair, bedside commode, etc,.)?: Unable Help needed moving to and from a bed to chair (including a wheelchair)?: A Lot Help needed walking in hospital room?: A Lot Help needed climbing 3-5 steps with a railing? : Total 6 Click Score: 8    End of Session Equipment Utilized During Treatment: Gait belt Activity Tolerance: Patient tolerated treatment well;Patient limited by fatigue Patient left: in bed;with bed alarm set;with call bell/phone within reach;with nursing/sitter in room Nurse Communication: Mobility status PT Visit Diagnosis: Hemiplegia and hemiparesis;Muscle weakness (generalized) (M62.81);Unsteadiness on feet (R26.81);Other abnormalities of gait and mobility (R26.89);Difficulty in walking, not elsewhere classified (R26.2) Hemiplegia - Right/Left: Right Hemiplegia - dominant/non-dominant: (unsure) Hemiplegia - caused by: Cerebral infarction     Time: 1610-9604 PT Time Calculation (min) (ACUTE ONLY): 28 min  Charges:  $Therapeutic Activity: 23-37 mins                    G Codes:       Encarnacion Chu PT, DPT 10/31/2017, 4:20 PM

## 2017-10-31 NOTE — Evaluation (Signed)
Occupational Therapy Evaluation Patient Details Name: Destiny BeringLinda M Berti MRN: [id number] DOB: June 03, 1946 Today's Date: 10/31/2017    History of Present Illness Pt is a 72 y/o F who presented with AMS.  Pt was found to have positive urine analysis. Pt with known h/o stroke 3-4 months ago when she refused to come to the hospital.  MRI showed: "Multiple punctate acute nonhemorrhagic lacunar infarcts involving the right internal capsule, right external capsule, and posterior left external capsule. These infarcts are all within the perforator territories.  Subacute nonhemorrhagic lacunar infarct within the left corona radiata.  Advanced atrophy and white matter disease consistent with chronic microvascular ischemia.  Remote lacunar infarcts involving the left paramedian pons and left cerebellum.  Minimal bilateral mastoid effusions.  No obstructing nasopharyngeal lesion is present."  Pt's PMH includes stroke, tremor of hands and face, incontinence.    Clinical Impression   Pt seen for OT evaluation this date. No family present, pt is a questionable historian given cognitive deficits from stroke... Prior to hospital admission, pt reports requiring assist for mobility and ADL tasks since recent stroke, prior to that pt reports she was independent.  Pt states she lives in a 2 story apartment with the only bathroom being on the 2nd floor. Pt notes 4 steps (NO hand rail) to enter the front door, and a full flight (14 steps, R hand rail) to get from 1st fl to 2nd fl.  Pt notes that [facility]" [facility], so she would have to carry the bucket and "carefully go up the stairs 1 at a time" to empty it in the bathroom. Currently pt is max assist for toileting, LB ADL, mod assist for UB ADL including self feeding. Pt with impaired R shoulder ROM, grossly weak globally, increased tone noted in BLE with decreased dorisflexion bilaterally. Intact sensation, no visual deficits appreciated. Pt oriented to  self, adamant [facility] but at a different hospital ("in a gray building"). Pt demonstrates cognitive deficits including awareness, safety, problem solving, motor planning, memory, and difficulty with following commands. NSG instructed in how to best assist pt with self feeding while incorporating RUE (dominant) into task, as pt demonstrates strong preference for using L hand to pick up food, despite being able to move her RUE some. Pt would benefit from skilled OT to address noted impairments and functional limitations (see below for any additional details).  Upon hospital discharge, recommend pt discharge to STR.    Follow Up Recommendations  SNF    Equipment Recommendations  None recommended by OT    Recommendations for Other Services       Precautions / Restrictions Precautions Precautions: Fall;Other (comment) Precaution Comments: monitor HR Restrictions Weight Bearing Restrictions: No      Mobility Bed Mobility               General bed mobility comments: pt declined OOB, as she wasn't finished with lunch and states "I'm waiting for someone to cut my nails and then maybe I'll show off for you"  Transfers                      Balance                                           ADL either performed or assessed with clinical judgement  ADL Overall ADL's : Needs assistance/impaired Eating/Feeding: Sitting;Moderate assistance;Cueing for compensatory techinques;Cueing for sequencing Eating/Feeding Details (indicate cue type and reason): with support at elbow, pt requires mod assist for scooping and bringing food to mouth; instructed NSG to perform at meal times Grooming: Bed level;Set up;Wash/dry hands;Minimal assistance Grooming Details (indicate cue type and reason): pt noted to have very dirty finger nails, pt states "oh that must be leftover nail polish", when handed wipe was able to wipe hands some, not thoroughly Upper Body  Bathing: Bed level;Minimal assistance;Moderate assistance   Lower Body Bathing: Bed level;Moderate assistance;Maximal assistance   Upper Body Dressing : Bed level;Minimal assistance;Moderate assistance   Lower Body Dressing: Bed level;Moderate assistance;Maximal assistance   Toilet Transfer: Stand-pivot;BSC;Moderate assistance;Maximal assistance   Toileting- Clothing Manipulation and Hygiene: Total assistance               Vision Baseline Vision/History: Wears glasses Wears Glasses: At all times Patient Visual Report: No change from baseline Vision Assessment?: No apparent visual deficits     Perception     Praxis      Pertinent Vitals/Pain Pain Assessment: No/denies pain     Hand Dominance Right   Extremity/Trunk Assessment Upper Extremity Assessment Upper Extremity Assessment: LUE deficits/detail;RUE deficits/detail;Difficult to assess due to impaired cognition RUE Deficits / Details: Unable to formally assess due to cognition, grip strength appears ~3/5, otherwise strength grossly 2+/5, unable to perform AAROM shoulder flexion past approx 90 degr, intact sensation, tremors noted bilaterally  LUE Deficits / Details: Strength grossly 3/5, intact sensation, tremors noted bilaterally    Lower Extremity Assessment Lower Extremity Assessment: Defer to PT evaluation;RLE deficits/detail;LLE deficits/detail;Difficult to assess due to impaired cognition       Communication Communication Communication: Expressive difficulties   Cognition Arousal/Alertness: Awake/alert Behavior During Therapy: WFL for tasks assessed/performed Overall Cognitive Status: No family/caregiver present to determine baseline cognitive functioning Area of Impairment: Safety/judgement;Orientation;Attention;Memory;Following commands;Awareness;Problem solving                 Orientation Level: Disoriented to;Situation;Time;Place Current Attention Level: Selective Memory: Decreased short-term  memory Following Commands: Follows one step commands inconsistently Safety/Judgement: Decreased awareness of deficits;Decreased awareness of safety Awareness: Intellectual Problem Solving: Slow processing;Decreased initiation;Difficulty sequencing;Requires verbal cues;Requires tactile cues General Comments: Pt moves her LUE/LE when asked to move her RUE/LE.  She follows one step commands inconsistently and requires occasional verbal cues and physical assist to initiate.    General Comments       Exercises     Shoulder Instructions      Home Living Family/patient expects to be discharged to:: Private residence Living Arrangements: Children(daughter, grandson) Available Help at Discharge: Family;Available 24 hours/day Type of Home: Apartment Home Access: Stairs to enter Entrance Stairs-Number of Steps: Pt reports living in a 2 story apartment with bedroom on first floor and NO bathroom on 1st. 4 STE w/ no rail Entrance Stairs-Rails: None Home Layout: Two level Alternate Level Stairs-Number of Steps: 14 steps w/ R handrail from 1st to 2nd floor where bathroom w/ tub/shower is Alternate Level Stairs-Rails: Right Bathroom Shower/Tub: Chief Strategy Officer: Standard     Home Equipment: Bedside commode;Wheelchair - manual   Additional Comments: No family present to confirm information provided by pt, pt is poor historian      Prior Functioning/Environment Level of Independence: Needs assistance  Gait / Transfers Assistance Needed: Limited information but chart review suggests that pt has been nonambulatory since her recent stroke. Pt says, "Melanee Spry usually helps me" with  transfers.  She reports her daughter assists with transfers to [facility] at home  ADL's / Homemaking Assistance Needed: Pt reports daughter assists with toileting hygiene, bathing, and dressing. Pt reports that dtr/gson won't empty her BSC so she has to take it herself up the flight of stairs "very carefully" to  empty it.    Comments: pt endorses several falls in past 12 months        OT Problem List: Decreased strength;Decreased range of motion;Decreased coordination;Decreased knowledge of use of DME or AE;Impaired UE functional use;Decreased activity tolerance;Decreased cognition      OT Treatment/Interventions: Self-care/ADL training;Balance training;Therapeutic exercise;Therapeutic activities;DME and/or AE instruction;Patient/family education    OT Goals(Current goals can be found in the care plan section) Acute Rehab OT Goals Patient Stated Goal: to receive rehab at d/c OT Goal Formulation: With patient Time For Goal Achievement: 11/14/17 Potential to Achieve Goals: Good ADL Goals Pt Will Perform Eating: with min guard assist;sitting;with adaptive utensils Pt Will Perform Grooming: with set-up;with min guard assist;sitting Pt Will Perform Upper Body Dressing: with min assist;sitting Pt Will Perform Lower Body Dressing: with min assist;with mod assist;sit to/from stand;with adaptive equipment Pt Will Transfer to Toilet: with min guard assist;with min assist;bedside commode;stand pivot transfer(LRAD)  OT Frequency: Min 2X/week   Barriers to D/C: Inaccessible home environment;Decreased caregiver support          Co-evaluation              AM-PAC PT "6 Clicks" Daily Activity     Outcome Measure Help from another person eating meals?: A Lot Help from another person taking care of personal grooming?: A Lot Help from another person toileting, which includes using toliet, bedpan, or urinal?: Total Help from another person bathing (including washing, rinsing, drying)?: A Lot Help from another person to put on and taking off regular upper body clothing?: A Lot Help from another person to put on and taking off regular lower body clothing?: Total 6 Click Score: 10   End of Session    Activity Tolerance: Patient tolerated treatment well Patient left: in bed;with call bell/phone  within reach;with bed alarm set;with nursing/sitter in room  OT Visit Diagnosis: Other abnormalities of gait and mobility (R26.89);Muscle weakness (generalized) (M62.81);Other symptoms and signs involving cognitive function;Repeated falls (R29.6)                Time: 1610-9604 OT Time Calculation (min): 42 min Charges:  OT General Charges $OT Visit: 1 Visit OT Evaluation $OT Eval Moderate Complexity: 1 Mod OT Treatments $Self Care/Home Management : 8-22 mins  Richrd Prime, MPH, MS, OTR/L ascom [phone] 10/31/17, 3:22 PM

## 2017-10-31 NOTE — Progress Notes (Signed)
Per MD patient is refusing SNF and he was not able to get in touch with her daughter. Peer to peer was not completed. Clinical Child psychotherapistocial Worker (CSW) left a Engineer, technical salesvoicemail for [employer] [facility] case manager inquiring if a peer to peer tomorrow is an option. Patient's insurance has not approved for SNF and patient will likely have to D/C home. MD aware of above.   [employer] Hughes IncorporatedBailey Yalanda Soderman, LCSW [phone]

## 2017-10-31 NOTE — Progress Notes (Signed)
[facility] sent SNF request to the medical director for review. Medical director requested a peer to peer. MD made aware of above. Per MD he will speak with patient and daughter and proceed as appropriate. MD aware that peer to peer has to be completed by 4 pm today.   [employer]orporatedBailey Codee Bloodworth, LCSW [phone]

## 2017-11-01 DIAGNOSIS — I639 Cerebral infarction, unspecified: Secondary | ICD-10-CM

## 2017-11-01 DIAGNOSIS — G9340 Encephalopathy, unspecified: Secondary | ICD-10-CM

## 2017-11-01 DIAGNOSIS — N390 Urinary tract infection, site not specified: Secondary | ICD-10-CM

## 2017-11-01 DIAGNOSIS — Z515 Encounter for palliative care: Secondary | ICD-10-CM

## 2017-11-01 DIAGNOSIS — R531 Weakness: Secondary | ICD-10-CM

## 2017-11-01 DIAGNOSIS — Z7189 Other specified counseling: Secondary | ICD-10-CM

## 2017-11-01 MED ORDER — ASPIRIN 325 MG PO TBEC: 325.0000 mg | DELAYED_RELEASE_TABLET | Freq: Every day | ORAL | 0 refills | Status: DC

## 2017-11-01 MED ORDER — CEPHALEXIN 500 MG PO CAPS: 500.0000 mg | ORAL_CAPSULE | Freq: Two times a day (BID) | ORAL | 0 refills | Status: DC

## 2017-11-01 NOTE — Progress Notes (Signed)
MD made decision to dc patient.  Discharge instructions discussed with patient.  Belongings and discharge instructions in belongings bag sent with EMT who picked patient up at 2030.

## 2017-11-01 NOTE — Care Management (Signed)
Ms. Destiny Mack spends 75% of her time in bed due to weakness from a stroke. Having problems with dysphagia which causes gagging and/or chocking. The head of the bed needs to be at least 30 degrees or more to prevent aspiration. Bed wedges will not provide adequate elevation to resolve the issue of aspiration. Also will need to frequently change body positions which a normal bed will not allow. Destiny GreetBrenda S Estrellita Lasky RN MSN CCM Care Management [phone]

## 2017-11-01 NOTE — Consult Note (Signed)
Consultation Note Date: 11/01/2017   Patient Name: Destiny Mack  DOB: March 25, 1946  MRN: [id number]  Age / Sex: 72 y.o., female  PCP: Oswaldo Conroy, MD Referring Physician: Delfino Lovett, MD  Reason for Consultation: Establishing goals of care  HPI/Patient Profile: Per EMR, Brought with altered mental status.  She had a stroke a few months ago but she refused to come to emergency room.  She has right-sided weakness, has been incontenient and unable to walk since then. When she came in she was covered in stool and urine.   Was very confused for last 2 days and noted to have UTI and acute stroke in hospital.     Clinical Assessment and Goals of Care: Plan for discharge today. Patient has been living at home with her daughter. Spoke with daughter via phone as she was not present for family meeting. Daughter states her mother requires assistance with ADL's. She describes moving her mother by putting her across her back and trying to carry her. She states she has health problems and caring for her is difficult,  she is concerned for her and her mother's safety with trying to mobilize her mother.  She states she would like for her mother to go to rehab to get stronger or learn ways to assist with movement and mobility. Patient initially stated earlier in this hospitalization she did not want to go to rehab and wanted to go home, but now states she would like to go to rehab. She would like care to maximize her functional status. Concern for safe discharge.   Spoke with and updated Dr. Linna Darner, Sherryll Burger, CM, SW. Paged ethics pager, no reply.    Plan for D/C today. Advised daughter palliative will follow outpatient. She states she was advised hospital bed would not arrive until between 1 and 5 today. She is requesting her mother not be discharged until if arrives, preferable tomorrow. Request communicated to Dr. Sherryll Burger.    Outpatient palliative to follow.   Code Status/Advance Care Planning:  DNR  Prognosis:   Poor.  Recurrent CVA's. Concern for falls.  Discharge Planning:   Patient discharging home with palliative to follow.      Primary Diagnoses: Present on Admission: . Acute encephalopathy   I have reviewed the medical record, interviewed the patient and family, and examined the patient. The following aspects are pertinent.  Past Medical History:  Diagnosis Date  . A-fib (HCC)   . Depression   . Frequent falls   . Hypertension   . Leaking of urine   . Mixed incontinence   . Stroke (HCC)   . Tremor of hands and face    Social History   Socioeconomic History  . Marital status: Widowed    Spouse name: None  . Number of children: None  . Years of education: None  . Highest education level: None  Social Needs  . Financial resource strain: None  . Food insecurity - worry: None  . Food insecurity - inability: None  . Transportation needs -  medical: None  . Transportation needs - non-medical: None  Occupational History  . None  Tobacco Use  . Smoking status: Former Smoker    Packs/day: 1.00    Years: 15.00    Pack years: 15.00    Last attempt to quit: 10/27/2017    Years since quitting: 0.0  . Smokeless tobacco: Never Used  Substance and Sexual Activity  . Alcohol use: No    Frequency: Never  . Drug use: No  . Sexual activity: Not Currently  Other Topics Concern  . None  Social History Narrative  . None   Family History  Problem Relation Age of Onset  . Dementia Mother   . Heart failure Father    Scheduled Meds: . aspirin EC  325 mg Oral Daily  . atorvastatin  40 mg Oral q1800  . cephALEXin  500 mg Oral Q12H  . enoxaparin (LOVENOX) injection  40 mg Subcutaneous Q24H  . nystatin   Topical BID   Continuous Infusions: . sodium chloride 50 mL/hr at 11/01/17 0354   PRN Meds:.acetaminophen **OR** acetaminophen, metoprolol tartrate, ondansetron (ZOFRAN)  IV Medications Prior to Admission:  Prior to Admission medications   Medication Sig Start Date End Date Taking? Authorizing Provider  amLODipine (NORVASC) 5 MG tablet Take 5 mg by mouth daily.   Yes [provider]  atorvastatin (LIPITOR) 20 MG tablet Take 20 mg by mouth daily.   Yes [provider]  lisinopril (PRINIVIL,ZESTRIL) 30 MG tablet Take 30 mg by mouth daily.   Yes [provider]  aspirin EC 325 MG EC tablet Take 1 tablet (325 mg total) by mouth daily. 11/01/17   Delfino LovettShah, Vipul, MD  cephALEXin (KEFLEX) 500 MG capsule Take 1 capsule (500 mg total) by mouth every 12 (twelve) hours. 11/01/17   Delfino LovettShah, Vipul, MD   No Known Allergies Review of Systems  All other systems reviewed and are negative.   Physical Exam  Pulmonary/Chest: Effort normal.  Neurological: She is alert.  Skin: Skin is warm and dry.    Vital Signs: BP (!) 164/71 (BP Location: Left Arm)   Pulse (!) 59   Temp 97.8 F (36.6 C) (Oral)   Resp 18   Ht 5\' 4"  (1.626 m)   Wt 72.6 kg (160 lb)   LMP  (LMP Unknown) Comment: uta  SpO2 98%   BMI 27.46 kg/m  Pain Assessment: 0-10   Pain Score: 0-No pain   SpO2: SpO2: 98 % O2 Device:SpO2: 98 % O2 Flow Rate: .   IO: Intake/output summary:   Intake/Output Summary (Last 24 hours) at 11/01/2017 1531 Last data filed at 11/01/2017 1038 Gross per 24 hour  Intake 240 ml  Output -  Net 240 ml    LBM: Last BM Date: (11/01/2017) Baseline Weight: Weight: 72.6 kg (160 lb) Most recent weight: Weight: 72.6 kg (160 lb)     Palliative Assessment/Data:30%     Time In: 11:00 Time Out: 1:30 Time Total: 150 min Greater than 50%  of this time was spent counseling and coordinating care related to the above assessment and plan.  Signed by: Morton Stallrystal Ren Aspinall, NP   Please contact Palliative Medicine Team phone at [phone] for questions and concerns.  For individual provider: See Loretha StaplerAmion

## 2017-11-01 NOTE — Consult Note (Deleted)
Plan for discharge today. Patient has been living at home with her daughter. Daughter states her mother requires assistance with ADL's. She describes moving her mother by putting her across her back and trying to carry her. She states she has health problems and caring for her is difficult for her and she is concerned for her and her mother's safety.   She states she would like for her mother to go to rehab to get stronger or learn ways to assist with movement and mobility. Patient initially stated earlier in this hospitalization she did not want to go to rehab and wanted to go home, but now states she would like to go to rehab. Concern for safe discharge.   Spoke with and updated Dr. Linna DarnerAnwar, Sherryll BurgerShah, CM, SW, paged ethics pager, no reply.    Plan for D/C today. Advised daughter palliative will follow outpatient. She states she was advised hospital bed would not arrive until between 1 and 5 today. She is requesting her mother not be discharged until if arrives, preferable tomorrow. Request communicated to Dr. Sherryll BurgerShah.   Outpatient palliative to follow.   No charge.

## 2017-11-01 NOTE — Care Management (Addendum)
Discharge to home today per Dr. Sherryll BurgerShah. Spoke with daughter, Clement SayresKatherine Cox. [phone]. States that she could pay $30.00 co-pay a month for a hospital bed. Will update Feliberto GottronJason Hinton, [facility] representative, States that she would like her mother transported home per [facility]. States that she could sit in recliner until bed is delivered.  [facility] be [facility], Palliative  and [facility]. Gwenette GreetBrenda S Praveen Coia RN MSN CCM Care Management [phone]

## 2017-11-01 NOTE — Progress Notes (Signed)
Family Meeting Note  Advance Directive:yes  Today a meeting took place with the Patient.   The following clinical team members were present during this meeting:MD  The following were discussed:Patient's diagnosis: 72 y.o. female with a known history of recent stroke 3-4 months ago when she refused to come into the hospital.  She has right-sided weakness   1.  Acute encephalopathy with acute cystitis: resolved 2.  Acute stroke with right-sided weakness  3.  Nausea vomiting. 4.  Tachycardia 5.  Accelerated hypertension 6.  Hyperlipidemia  7.  Lactic acidosis  8.  Acute kidney injury  Patient's progosis: > 12 months and Goals for treatment: DNR  Additional follow-up to be provided: Palliative care to follow at an outpt  Time spent during discussion:20 minutes  Delfino LovettVipul Dorothy Landgrebe, MD

## 2017-11-01 NOTE — Progress Notes (Signed)
I had discussion with patient and her daughter (over phone). They're in agreement with discharge home and provider as much support possible.  I've talked with CM Steward Drone(Brenda) and had lined up HH, PT,OT, SW, PC for home.  Please note D/C order is in place at 8:36 am.

## 2017-11-01 NOTE — Progress Notes (Signed)
Clinical Child psychotherapistocial Worker (CSW) discussed case with CSW Warehouse managerassistance director. Patient has no payer for SNF and will D/C home today. RN case manager arranged for a home health social worker. CSW made an Adult Management consultantrotective Services (APS) report and asked them to assist with long term care medicaid. Please reconsult if future social work needs arise. CSW signing off.   B[employer] Hughes IncorporatedBailey Keena Dinse, LCSW [phone]

## 2017-11-01 NOTE — Progress Notes (Signed)
Family called with questions concerning discharge status. Contact department director whom is reach out to MD to up date family.

## 2017-11-01 NOTE — Progress Notes (Signed)
EMS called for transport to home address. Daughter made aware [facility] Bo McclintockBrewer,Tannis Burstein S, RN

## 2017-11-01 NOTE — Progress Notes (Signed)
New referral for out patient Palliative to follow at home [facility] from [facility]. Plan is for discharge home today. Patent information faxed to referral. Dayna BarkerKaren Robertson RN, BSN, [facility], hospital Liaison [phone]

## 2017-11-01 NOTE — Progress Notes (Signed)
Granddaughter called. Password provided. Updated on Plan of care, discharge plans

## 2017-11-03 NOTE — Discharge Summary (Addendum)
[facility] [place] [facility]   PATIENT NAME: Destiny Mack    MR#:  [id number]  DATE OF BIRTH:  29-May-1946  DATE OF ADMISSION:  10/29/2017   ADMITTING PHYSICIAN: Alford Highlandichard Wieting, MD  DATE OF DISCHARGE: 11/01/2017  8:20 PM  PRIMARY CARE PHYSICIAN: Oswaldo ConroyBender, [name], MD   ADMISSION DIAGNOSIS:  Lower urinary tract infectious disease [N39.0] Vomiting [R11.10] Weakness [R53.1] Stroke (cerebrum) (HCC) [I63.9] DISCHARGE DIAGNOSIS:  Active Problems:   Acute encephalopathy   Pressure injury of skin  SECONDARY DIAGNOSIS:   Past Medical History:  Diagnosis Date  . A-fib (HCC)   . Depression   . Frequent falls   . Hypertension   . Leaking of urine   . Mixed incontinence   . Stroke (HCC)   . Tremor of hands and face    HOSPITAL COURSE:  72 y.o. female with a known history of recent stroke 3-4 months ago when she refused to come into the hospital.  She has right-sided weakness and unable to walk since then.  She is coming in with altered mental status for the last 2 days  1. Acute metabolic encephalopathy due to acute cystitis.  - improved with IV fluid hydration and IV Rocephin. Urine culture negative  - Her encephalopathy significantly improved, on the ay of discharge she is able to tell me where she is and why, she is also able to identify the year and name the president. She also told me some details about her living conditions at home and made it clear that she does not want to go to rehabilitation. 2. acute stroke with right-sided weakness and never had a workup.  - MRI of the brain revealed multiple punctate lacunar infarcts in the right internal/external capsule and posterior left external capsule.  Subacute infarct also noted in the left corona radiata with remote cerebellar and pontine infarcts.  Patient with a history of atrial fibrillation.  Infarcts likely embolic in etiology.  Patient on no antiplatelet or anticoagulation therapy at home.   Carotid dopplers show no evidence of hemodynamically significant stenosis.  Echocardiogram normal -  As per neurology, she is a candidate for anti-coagulation but noncompliant and had imbalance so not very safe. - continue ASA, atorvastatin at discharge.   3. Nausea vomiting: transient and resolved. Abdominal x-ray. No acute findings. 4. Tachycardia: resolvesd 5. Accelerated hypertension: resolved 6. Hyperlipidemia unspecified on Lipitor  7. Lactic acidosis likely from acute cystitis: resolved 8. Acute kidney injury: likely prerenal, recommend outpt monitoring DISCHARGE CONDITIONS:  stable CONSULTS OBTAINED:  Treatment Team:  Thana Farreynolds, [name], MD Altamese DillingVachhani, Vaibhavkumar, MD DRUG ALLERGIES:  No Known Allergies DISCHARGE MEDICATIONS:   Allergies as of 11/01/2017   No Known Allergies     Medication List    TAKE these medications   amLODipine 5 MG tablet Commonly known as:  NORVASC Take 5 mg by mouth daily.   aspirin 325 MG EC tablet Take 1 tablet (325 mg total) by mouth daily.   atorvastatin 20 MG tablet Commonly known as:  LIPITOR Take 20 mg by mouth daily.   cephALEXin 500 MG capsule Commonly known as:  KEFLEX Take 1 capsule (500 mg total) by mouth every 12 (twelve) hours.   lisinopril 30 MG tablet Commonly known as:  PRINIVIL,ZESTRIL Take 30 mg by mouth daily.        DISCHARGE INSTRUCTIONS:   DIET:  Regular diet DISCHARGE CONDITION:  Good ACTIVITY:  Activity as tolerated OXYGEN:  Home Oxygen: No.  Oxygen Delivery: room  air DISCHARGE LOCATION:  home with home health, PT, OT, SW & PALLIATIVE CARE  RN case manager arranged for a home health social worker. CSW made an Adult Management consultant (APS) report and asked them to assist with long term care medicaid.    If you experience worsening of your admission symptoms, develop shortness of breath, life threatening emergency, suicidal or homicidal thoughts you must seek medical attention immediately  by calling 911 or calling your MD immediately  if symptoms less severe.  You Must read complete instructions/literature along with all the possible adverse reactions/side effects for all the Medicines you take and that have been prescribed to you. Take any new Medicines after you have completely understood and accpet all the possible adverse reactions/side effects.   Please note  You were cared for by a hospitalist during your hospital stay. If you have any questions about your discharge medications or the care you received while you were in the hospital after you are discharged, you can call the unit and asked to speak with the hospitalist on call if the hospitalist that took care of you is not available. Once you are discharged, your primary care physician will handle any further medical issues. Please note that NO REFILLS for any discharge medications will be authorized once you are discharged, as it is imperative that you return to your primary care physician (or establish a relationship with a primary care physician if you do not have one) for your aftercare needs so that they can reassess your need for medications and monitor your lab values.    On the day of Discharge:  VITAL SIGNS:  Blood pressure (!) 164/71, pulse (!) 59, temperature 97.8 F (36.6 C), temperature source Oral, resp. rate 18, height 5\' 4"  (1.626 m), weight 72.6 kg (160 lb), SpO2 98 %. PHYSICAL EXAMINATION:  GENERAL:  72 y.o.-year-old patient lying in the bed with no acute distress.  EYES: Pupils equal, round, reactive to light and accommodation. No scleral icterus. Extraocular muscles intact.  HEENT: Head atraumatic, normocephalic. Oropharynx and nasopharynx clear.  NECK:  Supple, no jugular venous distention. No thyroid enlargement, no tenderness.  LUNGS: Normal breath sounds bilaterally, no wheezing, rales,rhonchi or crepitation. No use of accessory muscles of respiration.  CARDIOVASCULAR: S1, S2 normal. No murmurs,  rubs, or gallops.  ABDOMEN: Soft, non-tender, non-distended. Bowel sounds present. No organomegaly or mass.  EXTREMITIES: No pedal edema, cyanosis, or clubbing.  NEUROLOGIC: Cranial nerves II through XII are intact. Muscle strength 5/5 in all extremities. Sensation intact. Gait not checked.  PSYCHIATRIC: The patient is alert and oriented x 3.  SKIN: No obvious rash, lesion, or ulcer.  DATA REVIEW:   CBC Recent Labs  Lab 10/30/17 0626  WBC 5.9  HGB 12.9  HCT 39.4  PLT 109*    Chemistries  Recent Labs  Lab 10/29/17 1112 10/30/17 0626  NA 140 142  K 4.2 4.2  CL 107 112*  CO2 20* 20*  GLUCOSE 108* 90  BUN 26* 26*  CREATININE 1.49* 1.58*  CALCIUM 9.4 8.7*  AST 26  --   ALT 11*  --   ALKPHOS 91  --   BILITOT 0.9  --       Contact information for follow-up providers    Bender, Earl Lagos, MD. Schedule an appointment as soon as possible for a visit in 1 week(s).   Specialty:  Family Medicine Contact information: [address] [place] [address] [phone]  Lonell Face, MD. Schedule an appointment as soon as possible for a visit in 2 week(s).   Specialty:  Neurology Contact information: [phone] [facility] [address] [facility] [place] [address] [phone]            Contact information for after-discharge care    Destination    [name] .   Service:  Skilled Nursing Contact information: [address] [place] [address] [phone]                   Management plans discussed with the patient, family and they are in agreement.  CODE STATUS: Prior   TOTAL TIME TAKING CARE OF THIS PATIENT: 45 minutes.    Delfino Lovett M.D on 11/03/2017 at 4:21 PM  Between 7am to 6pm - Pager - [phone]  After 6pm go to [email or web address] - Social research officer, government  [facility] [facility]  Office  [phone]  CC: Primary care physician; Oswaldo Conroy, MD   Note: This dictation was prepared with Dragon dictation along with smaller phrase technology. Any transcriptional errors that result from this process are unintentional.

## 2017-11-05 ENCOUNTER — Ambulatory Visit: Payer: Self-pay

## 2017-11-06 ENCOUNTER — Telehealth: Payer: Self-pay

## 2017-11-06 NOTE — Telephone Encounter (Signed)
EMMI Follow-up: Reports shows question about follow-up.  Call patients number listed and it rings a fast busy signal so unable to leave a message.  Will follow-up again tomorrow.

## 2017-11-07 ENCOUNTER — Telehealth: Payer: Self-pay

## 2017-11-07 NOTE — Telephone Encounter (Signed)
EMMI Follow-up: 2nd attempt to contact patient but line continues to ring busy (tried 3 times this morning).

## 2017-11-09 ENCOUNTER — Telehealth: Payer: Self-pay | Admitting: Licensed Clinical Social Worker

## 2017-11-09 NOTE — Telephone Encounter (Signed)
EMMI flagged patient for answering yes to lost of interest in things and yes to feeling sad/hopeless/anxous/empty. Clinical Child psychotherapistocial Worker (CSW) contacted the phone number listed and patient's daughter Samara DeistKathryn answered. Per daughter she is working with a home health social worker to appeal [employer]'s decision to deny SNF. Daughter reported that she is trying to get patient into [name] term rehab and the home health social worker and nurse are helping. Daughter reported that she has been taking the follow up phone calls for patient. Daughter reported that patient has been down because she doesn't want to go to [name]. Daughter reported that patient is not having thoughts of hurting herself. Daughter reported no other needs or concerns. No follow up call is needed.   [employer]orporatedBailey Eshika Reckart, LCSW [phone]

## 2017-11-12 ENCOUNTER — Emergency Department
Admission: EM | Admit: 2017-11-12 | Discharge: 2017-11-13 | Disposition: A | Discharge status: Home / Self Care | Payer: Medicare HMO | Attending: Emergency Medicine | Admitting: Emergency Medicine

## 2017-11-12 DIAGNOSIS — Z7982 Long term (current) use of aspirin: Secondary | ICD-10-CM | POA: Diagnosis not present

## 2017-11-12 DIAGNOSIS — Z87891 Personal history of nicotine dependence: Secondary | ICD-10-CM | POA: Diagnosis not present

## 2017-11-12 DIAGNOSIS — R531 Weakness: Secondary | ICD-10-CM | POA: Diagnosis not present

## 2017-11-12 DIAGNOSIS — R627 Adult failure to thrive: Secondary | ICD-10-CM | POA: Diagnosis present

## 2017-11-12 DIAGNOSIS — I10 Essential (primary) hypertension: Secondary | ICD-10-CM | POA: Insufficient documentation

## 2017-11-12 DIAGNOSIS — Z79899 Other long term (current) drug therapy: Secondary | ICD-10-CM | POA: Insufficient documentation

## 2017-11-12 DIAGNOSIS — Z741 Need for assistance with personal care: Secondary | ICD-10-CM | POA: Insufficient documentation

## 2017-11-12 DIAGNOSIS — I69359 Hemiplegia and hemiparesis following cerebral infarction affecting unspecified side: Secondary | ICD-10-CM | POA: Insufficient documentation

## 2017-11-12 MED ORDER — ASPIRIN EC 325 MG PO TBEC
325.0000 mg | DELAYED_RELEASE_TABLET | Freq: Every day | ORAL | Status: DC
  Administered 2017-11-13: 325 mg via ORAL
  Filled 2017-11-12: qty 1

## 2017-11-12 MED ORDER — LISINOPRIL 10 MG PO TABS
30.0000 mg | ORAL_TABLET | Freq: Every day | ORAL | Status: DC
  Administered 2017-11-13: 30 mg via ORAL
  Filled 2017-11-12: qty 3

## 2017-11-12 MED ORDER — ATORVASTATIN CALCIUM 20 MG PO TABS
20.0000 mg | ORAL_TABLET | Freq: Every day | ORAL | Status: DC
  Administered 2017-11-13: 20 mg via ORAL
  Filled 2017-11-12: qty 1

## 2017-11-12 MED ORDER — AMLODIPINE BESYLATE 5 MG PO TABS
5.0000 mg | ORAL_TABLET | Freq: Every day | ORAL | Status: DC
  Administered 2017-11-13: 5 mg via ORAL
  Filled 2017-11-12: qty 1

## 2017-11-12 NOTE — ED Provider Notes (Signed)
[facility] Emergency Department Provider Note  Time seen: 6:59 PM  I have reviewed the triage vital signs and the nursing notes.   HISTORY  Chief Complaint Failure To Thrive    HPI Destiny Mack is a 72 y.o. female with a past medical history of depression, hypertension, CVA with right-sided weakness who presents the emergency department for inability to care for herself.  According to report daughter called EMS because patient is not allowing family to take care of her and is not being able to adequately care for herself.  Patient suffered a CVA approximately 3 months ago per patient involving her right side.  Patient has been home, states she has some home health care but largely is relying on her daughter.  She states however the daughter does not help her get to the bathroom, etc. so she ends up urinating and defecating on herself.  Upon arrival to the emergency department tonight the patient is covered in her own stool and urine, requiring decontamination in the [place] shower.  Patient has a largely negative review of systems otherwise.  Has no medical complaints at this time.  She states the Child psychotherapistsocial worker has been working with [employer] to get her to [employer] but is so far been unsuccessful.   Past Medical History:  Diagnosis Date  . A-fib (HCC)   . Depression   . Frequent falls   . Hypertension   . Leaking of urine   . Mixed incontinence   . Stroke (HCC)   . Tremor of hands and face     Patient Active Problem List   Diagnosis Date Noted  . Acute encephalopathy 10/29/2017  . Pressure injury of skin 10/29/2017    Past Surgical History:  Procedure Laterality Date  . TUBAL LIGATION      Prior to Admission medications   Medication Sig Start Date End Date Taking? Authorizing Provider  amLODipine (NORVASC) 5 MG tablet Take 5 mg by mouth daily.    [provider]  aspirin EC 325 MG EC tablet Take 1 tablet (325 mg total) by mouth  daily. 11/01/17   Delfino LovettShah, Vipul, MD  atorvastatin (LIPITOR) 20 MG tablet Take 20 mg by mouth daily.    [provider]  cephALEXin (KEFLEX) 500 MG capsule Take 1 capsule (500 mg total) by mouth every 12 (twelve) hours. 11/01/17   Delfino LovettShah, Vipul, MD  lisinopril (PRINIVIL,ZESTRIL) 30 MG tablet Take 30 mg by mouth daily.    [provider]    No Known Allergies  Family History  Problem Relation Age of Onset  . Dementia Mother   . Heart failure Father     Social History Social History   Tobacco Use  . Smoking status: Former Smoker    Packs/day: 1.00    Years: 15.00    Pack years: 15.00    Last attempt to quit: 10/27/2017    Years since quitting: 0.0  . Smokeless tobacco: Never Used  Substance Use Topics  . Alcohol use: No    Frequency: Never  . Drug use: No    Review of Systems Constitutional: Negative for fever. Eyes: Negative for visual complaints ENT: Negative for recent illness/congestion Cardiovascular: Negative for chest pain. Respiratory: Negative for shortness of breath. Gastrointestinal: Negative for abdominal pain, vomiting and diarrhea. Genitourinary: Negative for urinary compaints Musculoskeletal: Negative for musculoskeletal complaints Skin: Negative for skin complaints  Neurological: States right arm weakness for the past 3 or 4 months. All other ROS negative  ____________________________________________   PHYSICAL EXAM:  VITAL SIGNS: ED Triage Vitals  Enc Vitals Group     BP --      Pulse Rate 11/12/17 1848 83     Resp 11/12/17 1848 18     Temp 11/12/17 1848 98.5 F (36.9 C)     Temp Source 11/12/17 1848 Oral     SpO2 --      Weight 11/12/17 1842 160 lb (72.6 kg)     Height 11/12/17 1842 5\' 4"  (1.626 m)     Head Circumference --      Peak Flow --      Pain Score --      Pain Loc --      Pain Edu? --      Excl. in GC? --    Constitutional: Alert and oriented. Well appearing and in no distress. Eyes: Normal exam ENT   Head:  Normocephalic and atraumatic.   Mouth/Throat: Mucous membranes are moist. Cardiovascular: Normal rate, regular rhythm. No murmur Respiratory: Normal respiratory effort without tachypnea nor retractions. Breath sounds are clear  Gastrointestinal: Soft and nontender. No distention. Musculoskeletal: Nontender with normal range of motion in all extremities.  Neurologic:  Normal speech and language.  4/5 strength in right upper and lower extremities, other extremities appear to be 5/5 motor Skin:  Skin is warm, dry and intact.  Psychiatric: Mood and affect are normal. Speech and behavior are normal.   ____________________________________________  INITIAL IMPRESSION / ASSESSMENT AND PLAN / ED COURSE  Pertinent labs & imaging results that were available during my care of the patient were reviewed by me and considered in my medical decision making (see chart for details).  Patient presents to the emergency department for inability to care for herself at home.  We will check basic labs including a urinalysis, patient had a recent urinary tract infection.  We will place social work and physical therapy consults.  Overall patient appears well, no distress, but I do not believe the patient can adequately care for self and family has not been adequately able to care for the patient either.  We will board the patient in the emergency department tonight until social work and see tomorrow.  Patient agreeable with this plan of care.  ____________________________________________   FINAL CLINICAL IMPRESSION(S) / ED DIAGNOSES  Weakness Inability to care for self    Minna Antis, MD 11/16/17 2322

## 2017-11-12 NOTE — ED Triage Notes (Signed)
[facility].  Per EMS pt not allowing family to take care of her and has dirt and feces on her.  Pt was showered and cleaned up prior to coming into room and prior to this RN's assessment.  Pt is A&Ox4, in NAD.  Pt denies pain at this time.

## 2017-11-13 NOTE — Progress Notes (Signed)
PT Cancellation Note  Patient Details Name: Destiny BeringLinda M Mack MRN: [id number] DOB: 07-25-1946   Cancelled Treatment:    Reason Eval/Treat Not Completed: Other (comment)(Per discussion with Child psychotherapistsocial worker, patient/family not interested in STR level of care.  [facility]  No need for formal PT evaluation at this time.  Will complete order; please re-consult should needs change.)   Layann Bluett H. Manson PasseyBrown, PT, DPT, NCS 11/13/17, 2:15 PM [phone]

## 2017-11-13 NOTE — ED Notes (Signed)

## 2017-11-13 NOTE — ED Notes (Addendum)
Pt given meal tray. All bed linens and pt brief changed d/t soilage. Pt turned, repositioned, and new brief placed.

## 2017-11-13 NOTE — ED Notes (Signed)
[facility] present to transport pt back home. Called pt's daughter Apolonio Schneiders(Catherine Cox) [phone], daughter states she will meet EMS at home.

## 2017-11-13 NOTE — Clinical Social Work Note (Addendum)
CSW received a consult for "Pt can not care for self. Presented covered in stool/urine from home. Needs placement." CSW Fredric MareBailey updated this CSW about patient, as she worked with patient when previously admitted on 10/29/17. CSW staffed with Dr. Don PerkingVeronese and RN Helmut MusterAlicia. Patient from home and lives with daughter. CSW spoke with patient's daughter Apolonio SchneidersCatherine Cox ([phone]) to inform patient will be discharged home today. CSW inquired about patient's Long Term Care Medicaid application, which daughter had not started. Daughter stated she does not want patient to go to a SNF, as patient's wishes are to remain home. CSW and daughter discussed options for more support at home. CSW discussed PACE services and daughter agreeable to a referral. CSW also discussed [facility]-Home Services through [facility]. CSW to send Services handout through DSS, PACE Info sheet, the [employer] Resource booklet, the [employer] [facility] brochure, the placement process packet, and a SNF and ALF listing. Patient also has Charity fundraiserN, PT, OT, CNA, Child psychotherapistocial Worker, and Tele-help through [facility] services and [facility] services, per CSW TrimbleBailey. CSW updated [place] [facility] at [phone] of patient's discharge. CSW also left a voicemail for [facility] liaison Janci Minor at [phone]. Patient to discharge home via EMS. RN please call daughter when EMS picks up patient to make her aware of transport. Info placed on patient chart. CSW to make PACE referral. CSW updated EDP and RN. CSW signing off as no further Social Work intervention needed.   Corlis HoveJeneya Kizzy Olafson, Theresia MajorsLCSWA, [facility] Clinical Social Worker-ED [phone]

## 2017-11-21 ENCOUNTER — Observation Stay
Admission: EM | Admit: 2017-11-21 | Discharge: 2017-11-23 | Disposition: A | Discharge status: Skilled Nursing Facility | Payer: Medicare HMO | Attending: Internal Medicine | Admitting: Internal Medicine

## 2017-11-21 DIAGNOSIS — Z23 Encounter for immunization: Secondary | ICD-10-CM | POA: Diagnosis not present

## 2017-11-21 DIAGNOSIS — R778 Other specified abnormalities of plasma proteins: Secondary | ICD-10-CM

## 2017-11-21 DIAGNOSIS — F0150 Vascular dementia without behavioral disturbance: Secondary | ICD-10-CM | POA: Insufficient documentation

## 2017-11-21 DIAGNOSIS — Z8673 Personal history of transient ischemic attack (TIA), and cerebral infarction without residual deficits: Secondary | ICD-10-CM | POA: Diagnosis not present

## 2017-11-21 DIAGNOSIS — Z79899 Other long term (current) drug therapy: Secondary | ICD-10-CM | POA: Diagnosis not present

## 2017-11-21 DIAGNOSIS — I480 Paroxysmal atrial fibrillation: Secondary | ICD-10-CM | POA: Insufficient documentation

## 2017-11-21 DIAGNOSIS — I129 Hypertensive chronic kidney disease with stage 1 through stage 4 chronic kidney disease, or unspecified chronic kidney disease: Secondary | ICD-10-CM | POA: Diagnosis not present

## 2017-11-21 DIAGNOSIS — R197 Diarrhea, unspecified: Secondary | ICD-10-CM

## 2017-11-21 DIAGNOSIS — N183 Chronic kidney disease, stage 3 (moderate): Secondary | ICD-10-CM | POA: Insufficient documentation

## 2017-11-21 DIAGNOSIS — R627 Adult failure to thrive: Secondary | ICD-10-CM | POA: Diagnosis not present

## 2017-11-21 DIAGNOSIS — Z66 Do not resuscitate: Secondary | ICD-10-CM | POA: Insufficient documentation

## 2017-11-21 DIAGNOSIS — A045 Campylobacter enteritis: Principal | ICD-10-CM | POA: Insufficient documentation

## 2017-11-21 DIAGNOSIS — Z87891 Personal history of nicotine dependence: Secondary | ICD-10-CM | POA: Diagnosis not present

## 2017-11-21 DIAGNOSIS — Z6822 Body mass index (BMI) 22.0-22.9, adult: Secondary | ICD-10-CM | POA: Insufficient documentation

## 2017-11-21 DIAGNOSIS — N390 Urinary tract infection, site not specified: Secondary | ICD-10-CM | POA: Diagnosis not present

## 2017-11-21 DIAGNOSIS — R7989 Other specified abnormal findings of blood chemistry: Secondary | ICD-10-CM

## 2017-11-21 DIAGNOSIS — R748 Abnormal levels of other serum enzymes: Secondary | ICD-10-CM | POA: Insufficient documentation

## 2017-11-21 DIAGNOSIS — N3001 Acute cystitis with hematuria: Secondary | ICD-10-CM

## 2017-11-21 DIAGNOSIS — R531 Weakness: Secondary | ICD-10-CM

## 2017-11-21 LAB — GASTROINTESTINAL PANEL BY PCR, STOOL (REPLACES STOOL CULTURE)
Adenovirus F40/41: NOT DETECTED
Astrovirus: NOT DETECTED
Campylobacter species: DETECTED — AB
Cryptosporidium: NOT DETECTED
Cyclospora cayetanensis: NOT DETECTED
ENTAMOEBA HISTOLYTICA: NOT DETECTED
Enteroaggregative E coli (EAEC): NOT DETECTED
Enteropathogenic E coli (EPEC): NOT DETECTED
Enterotoxigenic E coli (ETEC): NOT DETECTED
GIARDIA LAMBLIA: NOT DETECTED
Norovirus GI/GII: NOT DETECTED
Plesimonas shigelloides: NOT DETECTED
Rotavirus A: NOT DETECTED
SALMONELLA SPECIES: NOT DETECTED
SHIGELLA/ENTEROINVASIVE E COLI (EIEC): NOT DETECTED
Sapovirus (I, II, IV, and V): NOT DETECTED
Shiga like toxin producing E coli (STEC): NOT DETECTED
VIBRIO CHOLERAE: NOT DETECTED
Vibrio species: NOT DETECTED
YERSINIA ENTEROCOLITICA: NOT DETECTED

## 2017-11-21 LAB — URINALYSIS, COMPLETE (UACMP) WITH MICROSCOPIC
BACTERIA UA: NONE SEEN
BILIRUBIN URINE: NEGATIVE
Glucose, UA: NEGATIVE mg/dL
KETONES UR: NEGATIVE mg/dL
Nitrite: NEGATIVE
Protein, ur: 30 mg/dL — AB
SQUAMOUS EPITHELIAL / LPF: NONE SEEN
Specific Gravity, Urine: 1.014 (ref 1.005–1.030)
pH: 5 (ref 5.0–8.0)

## 2017-11-21 LAB — CBC WITH DIFFERENTIAL/PLATELET
BASOS PCT: 1 %
Basophils Absolute: 0.1 10*3/uL (ref 0–0.1)
EOS PCT: 3 %
Eosinophils Absolute: 0.2 10*3/uL (ref 0–0.7)
HCT: 46.4 % (ref 35.0–47.0)
Hemoglobin: 15 g/dL (ref 12.0–16.0)
Lymphocytes Relative: 12 %
Lymphs Abs: 1.1 10*3/uL (ref 1.0–3.6)
MCH: 29.1 pg (ref 26.0–34.0)
MCHC: 32.4 g/dL (ref 32.0–36.0)
MCV: 89.7 fL (ref 80.0–100.0)
MONO ABS: 0.4 10*3/uL (ref 0.2–0.9)
Monocytes Relative: 5 %
NEUTROS ABS: 7.4 10*3/uL — AB (ref 1.4–6.5)
Neutrophils Relative %: 79 %
PLATELETS: 148 10*3/uL — AB (ref 150–440)
RBC: 5.17 MIL/uL (ref 3.80–5.20)
RDW: 16.8 % — AB (ref 11.5–14.5)
WBC: 9.2 10*3/uL (ref 3.6–11.0)

## 2017-11-21 LAB — COMPREHENSIVE METABOLIC PANEL
ALBUMIN: 3.5 g/dL (ref 3.5–5.0)
ALT: 11 U/L — ABNORMAL LOW (ref 14–54)
AST: 22 U/L (ref 15–41)
Alkaline Phosphatase: 100 U/L (ref 38–126)
Anion gap: 10 (ref 5–15)
BUN: 18 mg/dL (ref 6–20)
CHLORIDE: 107 mmol/L (ref 101–111)
CO2: 23 mmol/L (ref 22–32)
Calcium: 9 mg/dL (ref 8.9–10.3)
Creatinine, Ser: 1.57 mg/dL — ABNORMAL HIGH (ref 0.44–1.00)
GFR calc Af Amer: 37 mL/min — ABNORMAL LOW (ref >60)
GFR calc non Af Amer: 32 mL/min — ABNORMAL LOW (ref >60)
GLUCOSE: 130 mg/dL — AB (ref 65–99)
POTASSIUM: 4 mmol/L (ref 3.5–5.1)
SODIUM: 140 mmol/L (ref 135–145)
Total Bilirubin: 0.3 mg/dL (ref 0.3–1.2)
Total Protein: 7.4 g/dL (ref 6.5–8.1)

## 2017-11-21 LAB — C DIFFICILE QUICK SCREEN W PCR REFLEX
C DIFFICILE (CDIFF) TOXIN: NEGATIVE
C Diff antigen: NEGATIVE
C Diff interpretation: NOT DETECTED

## 2017-11-21 LAB — TROPONIN I: Troponin I: 0.05 ng/mL (ref <0.03)

## 2017-11-21 MED ORDER — CEFTRIAXONE SODIUM 1 G IJ SOLR
1.0000 g | Freq: Once | INTRAMUSCULAR | Status: AC
  Administered 2017-11-21: 1 g via INTRAVENOUS

## 2017-11-21 MED ORDER — SODIUM CHLORIDE 0.9 % IV SOLN
Freq: Once | INTRAVENOUS | Status: AC
  Administered 2017-11-21: 14:00:00 via INTRAVENOUS

## 2017-11-21 NOTE — Progress Notes (Signed)
Clinical Child psychotherapistocial Worker (CSW) received a call from patient's daughter Destiny Mack stating that DSS social worker and home health social worker advised daughter to send patient back to the ED for SNF placement. CSW made daughter aware that barrier to SNF placement is that insurance has denied SNF for rehab because they believe patient is at her baseline and will not significantly improve with PT. Patient can't pay privately for SNF and daughter has not applied for long term care medicaid because she does not want patient placed long term. CSW explained to daughter that patient will likely have to D/C back home and apply for long term care medicaid. Daughter verbalized her understanding.   [employer]iley Alonza Knisley, LCSW [phone]

## 2017-11-21 NOTE — ED Provider Notes (Addendum)
[facility] Emergency Department Provider Note       Time seen: ----------------------------------------- 1:19 PM on 11/21/2017 -----------------------------------------   I have reviewed the triage vital signs and the nursing notes.  HISTORY   Chief Complaint Weakness    HPI Destiny Mack is a 72 y.o. female with a history of fibrillation, depression, hypertension, CVA who presents to the ED for weakness and diarrhea.  Patient arrives via EMS from the daughter's home.  EMS states that patient is followed by Adult Protective Services daily with check-in visits.  She presents today covered in fecal matter and with an unkempt appearance.  Patient states the family puts her in the bed and just leaves for their.  She was discharged recently for failure to thrive.  Past Medical History:  Diagnosis Date  . A-fib (HCC)   . Depression   . Frequent falls   . Hypertension   . Leaking of urine   . Mixed incontinence   . Stroke (HCC)   . Tremor of hands and face     Patient Active Problem List   Diagnosis Date Noted  . Acute encephalopathy 10/29/2017  . Pressure injury of skin 10/29/2017    Past Surgical History:  Procedure Laterality Date  . TUBAL LIGATION      Allergies Patient has no known allergies.  Social History Social History   Tobacco Use  . Smoking status: Former Smoker    Packs/day: 1.00    Years: 15.00    Pack years: 15.00    Last attempt to quit: 10/27/2017    Years since quitting: 0.0  . Smokeless tobacco: Never Used  Substance Use Topics  . Alcohol use: No    Frequency: Never  . Drug use: No   Review of Systems Constitutional: Negative for fever. Cardiovascular: Negative for chest pain. Respiratory: Negative for shortness of breath. Gastrointestinal: Negative for abdominal pain, positive for diarrhea Musculoskeletal: Negative for back pain. Skin: Negative for rash. Neurological: Negative for headaches, positive for  generalized weakness  All systems negative/normal/unremarkable except as stated in the HPI  ____________________________________________   PHYSICAL EXAM:  VITAL SIGNS: ED Triage Vitals  Enc Vitals Group     BP      Pulse      Resp      Temp      Temp src      SpO2      Weight      Height      Head Circumference      Peak Flow      Pain Score      Pain Loc      Pain Edu?      Excl. in GC?    Constitutional: Alert and oriented.  Unkempt appearance, no distress Eyes: Conjunctivae are normal. Normal extraocular movements. ENT   Head: Normocephalic and atraumatic.   Nose: No congestion/rhinnorhea.   Mouth/Throat: Mucous membranes are moist.   Neck: No stridor. Cardiovascular: Normal rate, regular rhythm. No murmurs, rubs, or gallops. Respiratory: Normal respiratory effort without tachypnea nor retractions. Breath sounds are clear and equal bilaterally. No wheezes/rales/rhonchi. Gastrointestinal: Soft and nontender. Normal bowel sounds Musculoskeletal: Nontender with normal range of motion in extremities. No lower extremity tenderness nor edema. Neurologic:  Normal speech and language. No gross focal neurologic deficits are appreciated.  Skin:  Skin is warm, dry and intact. No rash noted. Psychiatric: Mood and affect are normal. Speech and behavior are normal.  ____________________________________________  EKG: Interpreted by me.  Atrial fibrillation with a rate of 91 bpm, likely anterior Q waves, LVH with repolarization abnormality, normal QT  ____________________________________________  ED COURSE:  As part of my medical decision making, I reviewed the following data within the electronic MEDICAL RECORD NUMBER History obtained from family if available, nursing notes, old chart and ekg, as well as notes from prior ED visits. Patient presented for weakness and diarrhea, we will assess with labs and imaging as indicated at this time.    Procedures ____________________________________________   LABS (pertinent positives/negatives)  Labs Reviewed  CBC WITH DIFFERENTIAL/PLATELET - Abnormal; Notable for the following components:      Result Value   RDW 16.8 (*)    Platelets 148 (*)    Neutro Abs 7.4 (*)    All other components within normal limits  COMPREHENSIVE METABOLIC PANEL - Abnormal; Notable for the following components:   Glucose, Bld 130 (*)    Creatinine, Ser 1.57 (*)    ALT 11 (*)    GFR calc non Af Amer 32 (*)    GFR calc Af Amer 37 (*)    All other components within normal limits  TROPONIN I - Abnormal; Notable for the following components:   Troponin I 0.05 (*)    All other components within normal limits  URINALYSIS, COMPLETE (UACMP) WITH MICROSCOPIC - Abnormal; Notable for the following components:   Color, Urine YELLOW (*)    APPearance HAZY (*)    Hgb urine dipstick MODERATE (*)    Protein, ur 30 (*)    Leukocytes, UA LARGE (*)    All other components within normal limits  C DIFFICILE QUICK SCREEN W PCR REFLEX  GASTROINTESTINAL PANEL BY PCR, STOOL (REPLACES STOOL CULTURE)  URINE CULTURE  CBG MONITORING, ED  ____________________________________________  DIFFERENTIAL DIAGNOSIS   C. difficile colitis, dehydration, electrolyte abnormality, other infectious diarrhea, failure to thrive  FINAL ASSESSMENT AND PLAN  Weakness, diarrhea, elevated troponin, UTI   Plan: The patient had presented for weakness and diarrhea over the last week. Patient's labs were concerning for an elevated troponin.  She does have chronic kidney disease which is unchanged but she also has a UTI.  Urine culture was sent she was given IV Rocephin.  Due to her persistent weakness she would benefit from hospital observation.   Ulice DashJohnathan E Shirin Echeverry, MD   Note: This note was generated in part or whole with voice recognition software. Voice recognition is usually quite accurate but there are transcription errors that  can and very often do occur. I apologize for any typographical errors that were not detected and corrected.     Emily FilbertWilliams, Zakhi Dupre E, MD 11/21/17 1528    Emily FilbertWilliams, Garrett Mitchum E, MD 11/21/17 [phone]

## 2017-11-21 NOTE — ED Notes (Signed)
     Hourly Rounding  Assessment Alert;Vital signs WDL  Intervention Call light w/in reach;Stretcher locked in lowest position;Pain assessed   Daugther called again this RN explained pt to be admitted. RN will continue to monitor.

## 2017-11-21 NOTE — ED Notes (Signed)
Pt was cleaned brief changed and peri care completed. Pt is resting in bed at this time.

## 2017-11-21 NOTE — ED Notes (Signed)
This RN received call from micro that pt GI PCR was positive for calibrator.

## 2017-11-21 NOTE — ED Notes (Signed)
Pt daughter called inquiring on an update. Pt gave this RN verbal consent to speak with daughter and provide update.   Clement SayresKatherine Cox [phone] call upon d/c but pt will need EMS transport upon d/c

## 2017-11-21 NOTE — ED Triage Notes (Signed)
[facility] daugthers house. Per EMS pt is followed by APS daily with check in visits. Pt presents today covered in fecal matter nails un kept with what apprears to be fecies under them. PT states family puts her in the bed and just ,leaves her there. Pt is not eating and was just d/c last week for FTT. RN will monitor.

## 2017-11-22 ENCOUNTER — Other Ambulatory Visit: Payer: Self-pay

## 2017-11-22 DIAGNOSIS — A045 Campylobacter enteritis: Secondary | ICD-10-CM | POA: Diagnosis present

## 2017-11-22 DIAGNOSIS — R197 Diarrhea, unspecified: Secondary | ICD-10-CM | POA: Diagnosis present

## 2017-11-22 LAB — CBC
HCT: 40.8 % (ref 35.0–47.0)
HEMOGLOBIN: 13.5 g/dL (ref 12.0–16.0)
MCH: 29.5 pg (ref 26.0–34.0)
MCHC: 33.2 g/dL (ref 32.0–36.0)
MCV: 88.8 fL (ref 80.0–100.0)
PLATELETS: 129 10*3/uL — AB (ref 150–440)
RBC: 4.59 MIL/uL (ref 3.80–5.20)
RDW: 16.5 % — ABNORMAL HIGH (ref 11.5–14.5)
WBC: 6.5 10*3/uL (ref 3.6–11.0)

## 2017-11-22 LAB — CREATININE, SERUM
CREATININE: 1.56 mg/dL — AB (ref 0.44–1.00)
GFR calc Af Amer: 37 mL/min — ABNORMAL LOW (ref >60)
GFR calc non Af Amer: 32 mL/min — ABNORMAL LOW (ref >60)

## 2017-11-22 LAB — PREALBUMIN: PREALBUMIN: 11.7 mg/dL — AB (ref 18–38)

## 2017-11-22 MED ORDER — ACETAMINOPHEN 325 MG PO TABS: 650.0000 mg | ORAL_TABLET | Freq: Four times a day (QID) | ORAL | Status: DC | PRN

## 2017-11-22 MED ORDER — CIPROFLOXACIN HCL 500 MG PO TABS
500.0000 mg | ORAL_TABLET | Freq: Two times a day (BID) | ORAL | Status: DC
  Administered 2017-11-22: 500 mg via ORAL
  Filled 2017-11-22: qty 1

## 2017-11-22 MED ORDER — CIPROFLOXACIN IN D5W 400 MG/200ML IV SOLN
400.0000 mg | Freq: Two times a day (BID) | INTRAVENOUS | Status: DC
  Filled 2017-11-22 (×2): qty 200

## 2017-11-22 MED ORDER — PNEUMOCOCCAL VAC POLYVALENT 25 MCG/0.5ML IJ INJ
0.5000 mL | INJECTION | INTRAMUSCULAR | Status: AC
  Administered 2017-11-23: 0.5 mL via INTRAMUSCULAR
  Filled 2017-11-22: qty 0.5

## 2017-11-22 MED ORDER — INFLUENZA VAC SPLIT HIGH-DOSE 0.5 ML IM SUSY
0.5000 mL | PREFILLED_SYRINGE | INTRAMUSCULAR | Status: DC
  Filled 2017-11-22: qty 0.5

## 2017-11-22 MED ORDER — ACETAMINOPHEN 650 MG RE SUPP: 650.0000 mg | Freq: Four times a day (QID) | RECTAL | Status: DC | PRN

## 2017-11-22 MED ORDER — HEPARIN SODIUM (PORCINE) 5000 UNIT/ML IJ SOLN
5000.0000 [IU] | Freq: Three times a day (TID) | INTRAMUSCULAR | Status: DC
  Administered 2017-11-22 – 2017-11-23 (×4): 5000 [IU] via SUBCUTANEOUS
  Filled 2017-11-22 (×4): qty 1

## 2017-11-22 MED ORDER — ENSURE ENLIVE PO LIQD: 237.0000 mL | Freq: Two times a day (BID) | ORAL | Status: DC

## 2017-11-22 MED ORDER — HYDROCODONE-ACETAMINOPHEN 5-325 MG PO TABS: 1.0000 | ORAL_TABLET | ORAL | Status: DC | PRN

## 2017-11-22 MED ORDER — FLORANEX PO PACK
1.0000 g | PACK | Freq: Three times a day (TID) | ORAL | Status: DC
  Administered 2017-11-22 – 2017-11-23 (×4): 1 g via ORAL
  Filled 2017-11-22 (×7): qty 1

## 2017-11-22 MED ORDER — CIPROFLOXACIN IN D5W 400 MG/200ML IV SOLN
400.0000 mg | INTRAVENOUS | Status: DC
  Administered 2017-11-22: 400 mg via INTRAVENOUS
  Filled 2017-11-22: qty 200

## 2017-11-22 MED ORDER — ONDANSETRON HCL 4 MG/2ML IJ SOLN: 4.0000 mg | Freq: Four times a day (QID) | INTRAMUSCULAR | Status: DC | PRN

## 2017-11-22 MED ORDER — LACTATED RINGERS IV SOLN
INTRAVENOUS | Status: AC
  Administered 2017-11-22: 03:00:00 via INTRAVENOUS

## 2017-11-22 MED ORDER — ONDANSETRON HCL 4 MG PO TABS: 4.0000 mg | ORAL_TABLET | Freq: Four times a day (QID) | ORAL | Status: DC | PRN

## 2017-11-22 MED ORDER — ADULT MULTIVITAMIN W/MINERALS CH
1.0000 | ORAL_TABLET | Freq: Every day | ORAL | Status: DC
  Administered 2017-11-22 – 2017-11-23 (×2): 1 via ORAL
  Filled 2017-11-22 (×2): qty 1

## 2017-11-22 NOTE — Progress Notes (Signed)
Family Meeting Note  Advance Directive:yes  Today a meeting took place with the Patient and daughter.  Patient is able to participate    The following clinical team members were present during this meeting:MD  The following were discussed:Patient's diagnosis: , Patient's progosis: > 12 months and Goals for treatment: DNR  Additional follow-up to be provided: prn  Time spent during discussion:20 minutes  Bertrum SolMontell D Rockelle Heuerman, MD

## 2017-11-22 NOTE — Clinical Social Work Placement (Signed)
   CLINICAL SOCIAL WORK PLACEMENT  NOTE  Date:  11/22/2017  Patient Details  Name: Destiny Mack MRN: [id number] Date of Birth: [birth year]  Clinical Social Work is seeking post-discharge placement for this patient at the Skilled  Nursing Facility level of care (*CSW will initial, date and re-position this form in  chart as items are completed):  Yes   Patient/family provided with [place] Clinical Social Work Department's list of facilities offering this level of care within the geographic area requested by the patient (or if unable, by the patient's family).  Yes   Patient/family informed of their freedom to choose among providers that offer the needed level of care, that participate in Medicare, Medicaid or managed care program needed by the patient, have an available bed and are willing to accept the patient.  Yes   Patient/family informed of [place]'s ownership interest in [facility] and [facility], as well as of the fact that they are under no obligation to receive care at these facilities.  PASRR submitted to EDS on       PASRR number received on       Existing PASRR number confirmed on 11/22/17     FL2 transmitted to all facilities in geographic area requested by pt/family on 11/22/17     FL2 transmitted to all facilities within larger geographic area on       Patient informed that his/her managed care company has contracts with or will negotiate with certain facilities, including the following:            Patient/family informed of bed offers received.  Patient chooses bed at       Physician recommends and patient chooses bed at      Patient to be transferred to   on  .  Patient to be transferred to facility by       Patient family notified on   of transfer.  Name of family member notified:        PHYSICIAN       Additional Comment:    _______________________________________________ Kalimah Capurro, Darleen CrockerBailey M, LCSW 11/22/2017, 9:43 AM
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
## 2017-11-22 NOTE — Clinical Social Work Note (Signed)
Clinical Social Work Assessment  Patient Details  Name: Destiny BeringLinda M Mack MRN: [id number] Date of Birth: 02/21/1946  Date of referral:  11/22/17               Reason for consult:  Facility Placement                Permission sought to share information with:  Oceanographeracility Contact Representative Permission granted to share information::  Yes, Verbal Permission Granted  Name::      Skilled Nursing Facility   Agency::   [facility]   Relationship::     Contact Information:     Housing/Transportation Living arrangements for the past 2 months:  Single Family Home Source of Information:  Adult Children Patient Interpreter Needed:  None Criminal Activity/Legal Involvement Pertinent to Current Situation/Hospitalization:  No - Comment as needed Significant Relationships:  Adult Children Lives with:  Adult Children Do you feel safe going back to the place where you live?    Need for family participation in patient care:  Yes (Comment)  Care giving concerns:  [place] with [place] daughter Destiny SayresKatherine Mack. [place] at [place]. [facility] also [facility] and a home health Child psychotherapistsocial worker.   Social Worker assessment / plan:  Visual merchandiserClinical Social Worker (CSW) is familiar with patient and her daughter Destiny LeatherwoodKatherine from previous admissions. This CSW has attempted to place patient into a SNF on the last admission however her insurance denied SNF because they believe patient is at her baseline and will not make significant improvement with PT. Patient is custodial care and more appropriate for long term care placement in a SNF however patient does not have a payer for long term care. Patient can't pay out of pocket for SNF and has not applied for long term care medicaid. CSW explained to daughter that she will have to apply for long term care medicaid through DSS. Daughter reported that she does not want long term care and she just wants  short term rehab. CSW emphasized that daughter needs to apply for long term care medicaid because that will be the only way patient can get into a SNF. Long term medicaid applications can take several months to process.    CSW will attempt to get patient placed in a SN[facility] however [name] will likely deny SNF again. FL2 complete and faxed out. [facility] once [facility] is available.    Employment status:  Disabled (Comment on whether or not currently receiving Disability) Insurance information:  Managed Medicare PT Recommendations:  Not assessed at this time Information / Referral to community resources:  Skilled Nursing Facility  Patient/Family's Response to care: Patient's daughter is requesting SNF placement.   Patient/Family's Understanding of and Emotional Response to Diagnosis, Current Treatment, and Prognosis:  Patient's daughter has not followed through on the long term care medicaid application.   Emotional Assessment Appearance:  Appears stated age Attitude/Demeanor/Rapport:  Unable to Assess Affect (typically observed):  Unable to Assess Orientation:  Oriented to Self, Oriented to Place, Fluctuating Orientation (Suspected and/or reported Sundowners) Alcohol / Substance use:  Not Applicable Psych involvement (Current and /or in the community):  No (Comment)  Discharge Needs  Concerns to be addressed:  Discharge Planning Concerns Readmission within the last 30 days:  No Current discharge risk:  Dependent with Mobility, Cognitively Impaired, Chronically ill Barriers to Discharge:  Continued Medical Work up  Destiny Mack, Destiny Crocker, LCSW 11/22/2017, 9:44 AM

## 2017-11-22 NOTE — Evaluation (Signed)
Physical Therapy Evaluation Patient Details Name: Destiny BeringLinda M Mack MRN: [id number] DOB: 1945/10/02 Today's Date: 11/22/2017   History of Present Illness  72 y/o F here with 3+ weeks of diarrhea, she was here 3 weeks ago with AMS.  Pt was found to have positive urine analysis. Pt with known h/o stroke 3-4 months ago when she refused to come to the hospital.  MRI showed: "Multiple punctate acute nonhemorrhagic lacunar infarcts involving the right internal capsule, right external capsule, and posterior left external capsule. These infarcts are all within the perforator territories.  Subacute nonhemorrhagic lacunar infarct within the left corona radiata.  Advanced atrophy and white matter disease consistent with chronic microvascular ischemia.  Remote lacunar infarcts involving the left paramedian pons and left cerebellum.  Minimal bilateral mastoid effusions.  No obstructing nasopharyngeal lesion is present."  Pt's PMH includes stroke, tremor of hands and face, incontinence.   Clinical Impression  Pt initially seems hesitant to participate with PT, but actually came around quickly and was motivated to do what she could.  Per pt she will lay in bed for days and daughter will do diaper clean up, etc and in the next sentence she reports she walks her dog daily but must be careful she doesn't pull her down... When PT discussed the inherent incongruity with these reports she offers no clarification on this.  This PT called pt's daughter after session and gathered that pt works with PT but does not do any walking (or have a walker) and that she is often resistant to do much with family or when PT isn't there and that she is largely unmotivated.  Pt was able to walk ~40 ft today with walker and was surprisingly safe and confident given that she has not walked in weeks (months?).  Pt would benefit from STR to help improve her functional status and realistically increase her independence, safety and confidence.    Follow  Up Recommendations SNF    Equipment Recommendations       Recommendations for Other Services       Precautions / Restrictions Precautions Precautions: Fall Restrictions Weight Bearing Restrictions: No      Mobility  Bed Mobility Overal bed mobility: Needs Assistance Bed Mobility: Supine to Sit     Supine to sit: Min guard     General bed mobility comments: Pt was slow with getting up to sitting but did not need direct physical assist  Transfers Overall transfer level: Needs assistance Equipment used: Rolling walker (2 wheeled) Transfers: Sit to/from Stand Sit to Stand: Min assist         General transfer comment: Pt was able to rise to standing w/o excessive assist or cuing, some unsteadiness, but ultimately better than she indicated during history gathering  Ambulation/Gait Ambulation/Gait assistance: Min assist Ambulation Distance (Feet): 40 Feet Assistive device: Rolling walker (2 wheeled)       General Gait Details: Pt with some hesitancy with iniating steppage but actually was able to quickly appear "comfortable" with reciprocal pattern and generally did much better than this PT expected given subjective history and previous PT notes.  Stairs            Wheelchair Mobility    Modified Rankin (Stroke Patients Only)       Balance Overall balance assessment: Needs assistance Sitting-balance support: Bilateral upper extremity supported Sitting balance-Leahy Scale: Good     Standing balance support: Bilateral upper extremity supported;During functional activity Standing balance-Leahy Scale: Fair  Pertinent Vitals/Pain Pain Assessment: No/denies pain    Home Living Family/patient expects to be discharged to:: Private residence Living Arrangements: Children Available Help at Discharge: Family;Available 24 hours/day Type of Home: Apartment Home Access: Level entry(previous note states she has 4 steps  to enter?)     Home Layout: Two level Home Equipment: Bedside commode;Wheelchair - manual      Prior Function Level of Independence: Needs assistance   Gait / Transfers Assistance Needed: apparently she has not been doing any walking (even with HHPT), states she usually needs assist transferring to w/c, etc  ADL's / Homemaking Assistance Needed: aide 1x/wk to help with bathing, needs assist with most tasks  Comments: Pt with inconsistent answers to questions     Hand Dominance        Extremity/Trunk Assessment   Upper Extremity Assessment Upper Extremity Assessment: Generalized weakness;Difficult to assess due to impaired cognition(R shoulder elevation limited secondary to CVA)    Lower Extremity Assessment Lower Extremity Assessment: Generalized weakness;Difficult to assess due to impaired cognition(R knee pain limited, grossly functional )       Communication   Communication: Expressive difficulties  Cognition Arousal/Alertness: Awake/alert Behavior During Therapy: WFL for tasks assessed/performed Overall Cognitive Status: No family/caregiver present to determine baseline cognitive functioning                                        General Comments      Exercises     Assessment/Plan    PT Assessment Patient needs continued PT services  PT Problem List Decreased strength;Decreased activity tolerance;Decreased range of motion;Decreased mobility;Decreased balance;Decreased cognition;Decreased knowledge of use of DME;Decreased safety awareness;Impaired sensation       PT Treatment Interventions DME instruction;Gait training;Stair training;Functional mobility training;Therapeutic activities;Therapeutic exercise;Balance training;Neuromuscular re-education;Cognitive remediation;Patient/family education;Wheelchair mobility training    PT Goals (Current goals can be found in the Care Plan section)  Acute Rehab PT Goals Patient Stated Goal: walk her  dog PT Goal Formulation: With patient Time For Goal Achievement: 11/29/17 Potential to Achieve Goals: Fair    Frequency Min 2X/week   Barriers to discharge        Co-evaluation               AM-PAC PT "6 Clicks" Daily Activity  Outcome Measure Difficulty turning over in bed (including adjusting bedclothes, sheets and blankets)?: A Little Difficulty moving from lying on back to sitting on the side of the bed? : A Lot Difficulty sitting down on and standing up from a chair with arms (e.g., wheelchair, bedside commode, etc,.)?: Unable Help needed moving to and from a bed to chair (including a wheelchair)?: A Little Help needed walking in hospital room?: A Little Help needed climbing 3-5 steps with a railing? : Total 6 Click Score: 13    End of Session Equipment Utilized During Treatment: Gait belt Activity Tolerance: Patient tolerated treatment well Patient left: with chair alarm set;with call bell/phone within reach   PT Visit Diagnosis: Muscle weakness (generalized) (M62.81);Difficulty in walking, not elsewhere classified (R26.2)    Time: 1610-9604 PT Time Calculation (min) (ACUTE ONLY): 30 min   Charges:   PT Evaluation $PT Eval Low Complexity: 1 Low PT Treatments $Gait Training: 8-22 mins   PT G Codes:        Malachi Pro, DPT 11/22/2017, 1:18 PM

## 2017-11-22 NOTE — Progress Notes (Signed)
Pharmacist-Provider Communication:  CrCl = 28 mL/min  Order for ciprofloxacin 400 mg IV q12h changed to 400 mg IV q24h based on renal function. Per protocol.  Cindi CarbonMary M Kateena Degroote, PharmD 11/22/17 9:05 AM

## 2017-11-22 NOTE — Progress Notes (Signed)
Initial Nutrition Assessment  DOCUMENTATION CODES:   Not applicable  INTERVENTION:  Ensure Enlive po BID, each supplement provides 350 kcal and 20 grams of protein  NUTRITION DIAGNOSIS:   Increased nutrient needs related to wound healing as evidenced by estimated needs.  GOAL:   Patient will meet greater than or equal to 90% of their needs  MONITOR:   PO intake, Supplement acceptance, I & O's, Labs, Weight trends  REASON FOR ASSESSMENT:   Consult Assessment of nutrition requirement/status  ASSESSMENT:   72 y.o. F quadriplegic (some movement in R arm and b/l legs) admitted for acute diarrhea w/ CKD stg III, chronic FTT, and possible UTI.   Medications: heparin, MVI, ciprofloxacin, lactated ringers infusion.   Labs: creatinine 1.56 (H), GFR 32 (L). 3/27:  BG 130 (H)  Pt smokes 1.5 packs a day.   Pt reports feeling better from admit and according to chart has had normal stool yesterday and no output today.   She reports no recent weight loss and that her appetite has been good both here and PTA - PTA intake was not specified, but here she reports eating breakfast including hash browns, coffee, and eggs.   Was unable to verify weight as pt was on chair - cannot confirm any previous weights. NFPE was difficult to complete as pt chair position and bedside table made it difficult to get close enough for portions of the exam.    NUTRITION - FOCUSED PHYSICAL EXAM:    Most Recent Value  Orbital Region  No depletion  Upper Arm Region  Moderate depletion  Thoracic and Lumbar Region  No depletion  Buccal Region  No depletion  Temple Region  Mild depletion  Clavicle Bone Region  No depletion  Clavicle and Acromion Bone Region  No depletion  Scapular Bone Region  Unable to assess  Dorsal Hand  Moderate depletion  Patellar Region  Unable to assess [swelling around knees]  Anterior Thigh Region  Mild depletion  Posterior Calf Region  Mild depletion  Edema (RD Assessment)   Unable to assess [socks on feet, did not want to disturb pressure injury on heels. ]  Hair  Reviewed  Eyes  Reviewed  Mouth  Unable to assess [could not see well enough from distance. ]  Skin  Reviewed [Eccymosis]  Nails  Reviewed [yellow in color, but hard to tell due to debris under nails. ]       Diet Order:  Diet Heart Room service appropriate? Yes; Fluid consistency: Thin Fall precautions Aspiration precautions  EDUCATION NEEDS:   Education needs have been addressed  Skin:  Skin Assessment: Skin Integrity Issues: Skin Integrity Issues:: Stage I, Other (Comment) Stage I: Intact skin with non-blanchable redness of a localized area usually over a bony prominence. Coccyx.  Other: Eccymosis, redness to heels and buttocks  Last BM:  11/21/17 Type 4  Height:   Ht Readings from Last 1 Encounters:  11/22/17 5\' 4"  (1.626 m)    Weight:   Wt Readings from Last 1 Encounters:  11/22/17 131 lb (59.4 kg)   UBW: Unknown, suspect approximately 160 lbs from chart  Ideal Body Weight:  54.5 kg  BMI:  Body mass index is 22.49 kg/m.  Estimated Nutritional Needs:   Kcal:  1300-1550 (1.2 - 1.4 MSJ)  Protein:  74-77 grams (1.25-1.3 factor)  Fluid:  > 1.5 L (age adjusted method)  Sherrine MaplesMelissa Tyller Bowlby, Dietetic Intern

## 2017-11-22 NOTE — [place] FL2 (Signed)
[place] MEDICAID FL2 LEVEL OF CARE SCREENING TOOL     IDENTIFICATION  Patient Name: Destiny Mack Birthdate: 1945/12/21 Sex: female Admission Date (Current Location): 11/21/2017  [place]   Facility and Address:  [facility], [address], [place], [place] [address]      Provider Number: [id number]  Attending Physician Name and Address:  Milagros LollSudini, Srikar, MD  Relative Name and Phone Number:       Current Level of Care: Hospital Recommended Level of Care: Skilled Nursing Facility Prior Approval Number:    Date Approved/Denied:   PASRR Number: [id number])  Discharge Plan: SNF    Current Diagnoses: Patient Active Problem List   Diagnosis Date Noted  . Diarrhea 11/22/2017  . Campylobacter diarrhea 11/22/2017  . Acute encephalopathy 10/29/2017  . Pressure injury of skin 10/29/2017    Orientation RESPIRATION BLADDER Height & Weight     Self, Time, Situation, Place  Normal Incontinent Weight: 131 lb (59.4 kg) Height:  5\' 4"  (162.6 cm)  BEHAVIORAL SYMPTOMS/MOOD NEUROLOGICAL BOWEL NUTRITION STATUS      Incontinent Diet(Diet: Heart Healthy )  AMBULATORY STATUS COMMUNICATION OF NEEDS Skin   Extensive Assist Verbally PU Stage and Appropriate Care(pressure ulcer stage 1 on coccyx. )                       Personal Care Assistance Level of Assistance  Bathing, Feeding, Dressing Bathing Assistance: Limited assistance Feeding assistance: Independent Dressing Assistance: Limited assistance     Functional Limitations Info  Sight, Hearing, Speech Sight Info: Adequate Hearing Info: Impaired Speech Info: Adequate    SPECIAL CARE FACTORS FREQUENCY  PT (By licensed PT), OT (By licensed OT)     PT Frequency: (5) OT Frequency: (5)            Contractures      Additional Factors Info  Code Status, Allergies, Isolation Precautions Code Status Info: (DNR ) Allergies Info: (No Known Allergies. )      Isolation Precautions Info: (Enteric precautions (Campylobacter species) )     Current Medications (11/22/2017):  This is the current hospital active medication list Current Facility-Administered Medications  Medication Dose Route Frequency Provider Last Rate Last Dose  . acetaminophen (TYLENOL) tablet 650 mg  650 mg Oral Q6H PRN Salary, Montell D, MD       Or  . acetaminophen (TYLENOL) suppository 650 mg  650 mg Rectal Q6H PRN Salary, Montell D, MD      . ciprofloxacin (CIPRO) IVPB 400 mg  400 mg Intravenous Q24H Cindi CarbonSwayne, Mary M, RPH      . heparin injection 5,000 Units  5,000 Units Subcutaneous Q8H Salary, Montell D, MD   5,000 Units at 11/22/17 0547  . HYDROcodone-acetaminophen (NORCO/VICODIN) 5-325 MG per tablet 1-2 tablet  1-2 tablet Oral Q4H PRN Salary, Montell D, MD      . Melene Muller[START ON 11/23/2017] Influenza vac split quadrivalent PF (FLUZONE HIGH-DOSE) injection 0.5 mL  0.5 mL Intramuscular Tomorrow-1000 Salary, Montell D, MD      . lactated ringers infusion   Intravenous Continuous Salary, Jetty DuhamelMontell D, MD 75 mL/hr at 11/22/17 0316    . lactobacillus (FLORANEX/LACTINEX) granules 1 g  1 g Oral TID WC Salary, Montell D, MD      . multivitamin with minerals tablet 1 tablet  1 tablet Oral Daily Salary, Montell D, MD      . ondansetron (ZOFRAN) tablet 4 mg  4 mg Oral Q6H PRN  Salary, Evelena Asa, MD       Or  . ondansetron (ZOFRAN) injection 4 mg  4 mg Intravenous Q6H PRN Salary, Montell D, MD      . Melene Muller ON 11/23/2017] pneumococcal 23 valent vaccine (PNU-IMMUNE) injection 0.5 mL  0.5 mL Intramuscular Tomorrow-1000 Salary, Evelena Asa, MD         Discharge Medications: Please see discharge summary for a list of discharge medications.  Relevant Imaging Results:  Relevant Lab Results:   Additional Information (SSN: [id number])  Jimi Giza, Darleen Crocker, LCSW

## 2017-11-22 NOTE — Progress Notes (Signed)
[facility] - [place] at [facility]   PATIENT NAME: Destiny Mack    MR#:  [id number]  DATE OF BIRTH:  12-08-1945  SUBJECTIVE:  CHIEF COMPLAINT:   Chief Complaint  Patient presents with  . Weakness   Patient is awake and alert. No pain.  Diarrhea is improved.  REVIEW OF SYSTEMS:    Review of Systems  Unable to perform ROS: Dementia    DRUG ALLERGIES:  No Known Allergies  VITALS:  Blood pressure 134/65, pulse 68, temperature 98.5 F (36.9 C), temperature source Oral, resp. rate 16, height 5\' 4"  (1.626 m), weight 59.4 kg (131 lb), SpO2 100 %.  PHYSICAL EXAMINATION:   Physical Exam  GENERAL:  72 y.o.-year-old patient lying in the bed with no acute distress.  EYES: Pupils equal, round, reactive to light and accommodation. No scleral icterus. Extraocular muscles intact.  HEENT: Head atraumatic, normocephalic. Oropharynx and nasopharynx clear.  NECK:  Supple, no jugular venous distention. No thyroid enlargement, no tenderness.  LUNGS: Normal breath sounds bilaterally, no wheezing, rales, rhonchi. No use of accessory muscles of respiration.  CARDIOVASCULAR: S1, S2 normal. No murmurs, rubs, or gallops.  ABDOMEN: Soft, nontender, nondistended. Bowel sounds present. No organomegaly or mass.  EXTREMITIES: No cyanosis, clubbing or edema b/l.    NEUROLOGIC: Cranial nerves II through XII are intact. No focal Motor or sensory deficits b/l.   PSYCHIATRIC: The patient is alert and awake SKIN: No obvious rash, lesion, or ulcer.   LABORATORY PANEL:   CBC Recent Labs  Lab 11/22/17 0326  WBC 6.5  HGB 13.5  HCT 40.8  PLT 129*   ------------------------------------------------------------------------------------------------------------------ Chemistries  Recent Labs  Lab 11/21/17 1328 11/22/17 0326  NA 140  --   K 4.0  --   CL 107  --   CO2 23  --   GLUCOSE 130*  --   BUN 18  --   CREATININE 1.57* 1.56*  CALCIUM 9.0  --   AST 22  --   ALT 11*  --    ALKPHOS 100  --   BILITOT 0.3  --    ------------------------------------------------------------------------------------------------------------------  Cardiac Enzymes Recent Labs  Lab 11/21/17 1328  TROPONINI 0.05*   ------------------------------------------------------------------------------------------------------------------  RADIOLOGY:  No results found.   ASSESSMENT AND PLAN:   *Campylobacter diarrhea On ciprofloxacin.  Diarrhea is improving.  *UTI.  Cultures pending.  On antibiotics.  *Vascular dementia.  Monitor for inpatient delirium.  *Acute on chronic weakness.  Worked with physical therapy. Skilled nursing facility versus home health at discharge.  *Paroxysmal atrial fibrillation.  Continue home medications.  Not on anticoagulation due to frequent falls.  All the records are reviewed and case discussed with Care Management/Social Worker Management plans discussed with the patient, family and they are in agreement.  CODE STATUS: DNR  DVT Prophylaxis: SCDs  TOTAL TIME TAKING CARE OF THIS PATIENT: 30 minutes.   POSSIBLE D/C IN 1-2 DAYS, DEPENDING ON CLINICAL CONDITION.  [name] M.D on 11/22/2017 at 3:20 PM  Between 7am to 6pm - Pager - [phone]  After 6pm go to [email or web address] - password [email or web address] [facility]  [facility] [place] [facility]  Office  [phone]  CC: Primary care physician; Oswaldo ConroyBender, [name], MD  Note: This dictation was prepared with Dragon dictation along with smaller phrase technology. Any transcriptional errors that result from this process are unintentional.

## 2017-11-22 NOTE — Progress Notes (Addendum)
Clinical Child psychotherapistocial Worker (CSW) contacted patient's daughter [name] and presented bed offers. She chose [facility]. [facility] been started via [place] [facility]. [facility] at [facility] they will have a private room for her.   Patient is aware of above and agreeable to go to [facility] if insurance gives approval. Patient has an open APS case. APS worker is Saintclair HalstedRhesha Carr [phone].   [employer] Marleen Moret, LCSW [phone]

## 2017-11-23 LAB — URINE CULTURE: CULTURE: NO GROWTH

## 2017-11-23 MED ORDER — CIPROFLOXACIN HCL 250 MG PO TABS: 250.0000 mg | ORAL_TABLET | Freq: Two times a day (BID) | ORAL | 0 refills | Status: DC

## 2017-11-23 MED ORDER — CIPROFLOXACIN HCL 500 MG PO TABS: 500.0000 mg | ORAL_TABLET | ORAL | Status: DC

## 2017-11-23 NOTE — Progress Notes (Signed)
Patient discharged via EMS. Timara Loma S, RN  

## 2017-11-23 NOTE — Progress Notes (Signed)
Patient is currently followed by out patient PALLIATIVE at home. Per CSW [employer]Bailey Sample, patient will discharge today to [facility]. Updated notes faxed to referral.  Dayna BarkerKaren Robertson RN, BSN, [facility] [place], hospital Liaison [phone]

## 2017-11-23 NOTE — Clinical Social Work Placement (Signed)
   CLINICAL SOCIAL WORK PLACEMENT  NOTE  Date:  11/23/2017  Patient Details  Name: Destiny Mack MRN: [id number] Date of Birth: [birth year]  Clinical Social Work is seeking post-discharge placement for this patient at the Skilled  Nursing Facility level of care (*CSW will initial, date and re-position this form in  chart as items are completed):  Yes   Patient/family provided with [place] Clinical Social Work Department's list of facilities offering this level of care within the geographic area requested by the patient (or if unable, by the patient's family).  Yes   Patient/family informed of their freedom to choose among providers that offer the needed level of care, that participate in Medicare, Medicaid or managed care program needed by the patient, have an available bed and are willing to accept the patient.  Yes   Patient/family informed of [facility]'s ownership interest in [facility] and [facility], as well as of the fact that they are under no obligation to receive care at these facilities.  PASRR submitted to EDS on       PASRR number received on       Existing PASRR number confirmed on 11/22/17     FL2 transmitted to all facilities in geographic area requested by pt/family on 11/22/17     FL2 transmitted to all facilities within larger geographic area on       Patient informed that his/her managed care company has contracts with or will negotiate with certain facilities, including the following:        Yes   Patient/family informed of bed offers received.  Patient chooses bed at [facility] )     Physician recommends and patient chooses bed at      Patient to be transferred to [facility] ) on 11/23/17.  Patient to be transferred to facility by [facility] )     Patient family notified on 11/23/17 of transfer.  Name of family member notified:  (Patient's daughter Natalia Leatherwood is aware of D/C today. )     PHYSICIAN        Additional Comment:    _______________________________________________ Inette Doubrava, Darleen Crocker, LCSW 11/23/2017, 12:05 PM

## 2017-11-23 NOTE — Care Management Obs Status (Signed)
MEDICARE OBSERVATION STATUS NOTIFICATION   Patient Details  Name: Destiny Mack MRN: [id number] Date of Birth: Apr 09, 1946   Medicare Observation Status Notification Given:  Yes. Telephone call to daughter [name]    [name] [name], RN 11/23/2017, 2:23 PM

## 2017-11-23 NOTE — Progress Notes (Signed)
Patient is medically stable for D/C to [facility] today. [facility] has been received through [facility], authorization # [id number], RVB. Per Tresa EndoKelly admissions coordinator at [facility] patient can come to private room 6 today. RN will call report and arrange EMS for transport. Clinical Child psychotherapistocial Worker (CSW) sent D/C orders to [facility] via [place]. Patient is aware of above. CSW contacted patient's daughter [name] and made her aware of above. Please reconsult if future social work needs arise. CSW signing off.   [employer]iley Maddie Brazier, LCSW [phone]
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
## 2017-11-23 NOTE — Discharge Instructions (Signed)
Regular diet. °Activity as tolerated. °

## 2017-12-13 ENCOUNTER — Inpatient Hospital Stay: Payer: Medicare HMO

## 2017-12-13 ENCOUNTER — Other Ambulatory Visit: Payer: Self-pay

## 2017-12-13 ENCOUNTER — Emergency Department: Payer: Medicare HMO

## 2017-12-13 ENCOUNTER — Inpatient Hospital Stay
Admission: EM | Admit: 2017-12-13 | Discharge: 2017-12-15 | DRG: 682 | Disposition: A | Discharge status: Home / Self Care | Payer: Medicare HMO | Attending: Internal Medicine | Admitting: Internal Medicine

## 2017-12-13 ENCOUNTER — Encounter: Payer: Self-pay | Admitting: Emergency Medicine

## 2017-12-13 DIAGNOSIS — N3946 Mixed incontinence: Secondary | ICD-10-CM | POA: Diagnosis present

## 2017-12-13 DIAGNOSIS — F329 Major depressive disorder, single episode, unspecified: Secondary | ICD-10-CM | POA: Diagnosis present

## 2017-12-13 DIAGNOSIS — I248 Other forms of acute ischemic heart disease: Secondary | ICD-10-CM | POA: Diagnosis present

## 2017-12-13 DIAGNOSIS — I959 Hypotension, unspecified: Secondary | ICD-10-CM | POA: Diagnosis present

## 2017-12-13 DIAGNOSIS — I6310 Cerebral infarction due to embolism of unspecified precerebral artery: Secondary | ICD-10-CM | POA: Diagnosis present

## 2017-12-13 DIAGNOSIS — I482 Chronic atrial fibrillation: Secondary | ICD-10-CM | POA: Diagnosis present

## 2017-12-13 DIAGNOSIS — Z79899 Other long term (current) drug therapy: Secondary | ICD-10-CM | POA: Diagnosis not present

## 2017-12-13 DIAGNOSIS — Z9114 Patient's other noncompliance with medication regimen: Secondary | ICD-10-CM

## 2017-12-13 DIAGNOSIS — E876 Hypokalemia: Secondary | ICD-10-CM | POA: Diagnosis present

## 2017-12-13 DIAGNOSIS — R112 Nausea with vomiting, unspecified: Secondary | ICD-10-CM | POA: Diagnosis present

## 2017-12-13 DIAGNOSIS — F17200 Nicotine dependence, unspecified, uncomplicated: Secondary | ICD-10-CM | POA: Diagnosis present

## 2017-12-13 DIAGNOSIS — I10 Essential (primary) hypertension: Secondary | ICD-10-CM | POA: Diagnosis present

## 2017-12-13 DIAGNOSIS — N179 Acute kidney failure, unspecified: Principal | ICD-10-CM | POA: Diagnosis present

## 2017-12-13 DIAGNOSIS — I951 Orthostatic hypotension: Secondary | ICD-10-CM | POA: Diagnosis present

## 2017-12-13 DIAGNOSIS — I639 Cerebral infarction, unspecified: Secondary | ICD-10-CM | POA: Diagnosis not present

## 2017-12-13 DIAGNOSIS — N3090 Cystitis, unspecified without hematuria: Secondary | ICD-10-CM | POA: Diagnosis present

## 2017-12-13 DIAGNOSIS — R55 Syncope and collapse: Secondary | ICD-10-CM

## 2017-12-13 LAB — CBC
HCT: 39.8 % (ref 35.0–47.0)
Hemoglobin: 13.2 g/dL (ref 12.0–16.0)
MCH: 29.7 pg (ref 26.0–34.0)
MCHC: 33.2 g/dL (ref 32.0–36.0)
MCV: 89.4 fL (ref 80.0–100.0)
PLATELETS: 202 10*3/uL (ref 150–440)
RBC: 4.46 MIL/uL (ref 3.80–5.20)
RDW: 16.6 % — AB (ref 11.5–14.5)
WBC: 11.9 10*3/uL — ABNORMAL HIGH (ref 3.6–11.0)

## 2017-12-13 LAB — BASIC METABOLIC PANEL
Anion gap: 10 (ref 5–15)
BUN: 35 mg/dL — AB (ref 6–20)
CHLORIDE: 103 mmol/L (ref 101–111)
CO2: 23 mmol/L (ref 22–32)
CREATININE: 1.96 mg/dL — AB (ref 0.44–1.00)
Calcium: 8.5 mg/dL — ABNORMAL LOW (ref 8.9–10.3)
GFR calc Af Amer: 28 mL/min — ABNORMAL LOW (ref >60)
GFR calc non Af Amer: 24 mL/min — ABNORMAL LOW (ref >60)
Glucose, Bld: 123 mg/dL — ABNORMAL HIGH (ref 65–99)
Potassium: 3.6 mmol/L (ref 3.5–5.1)
Sodium: 136 mmol/L (ref 135–145)

## 2017-12-13 LAB — GLUCOSE, CAPILLARY: Glucose-Capillary: 124 mg/dL — ABNORMAL HIGH (ref 65–99)

## 2017-12-13 LAB — TROPONIN I: Troponin I: 0.05 ng/mL (ref <0.03)

## 2017-12-13 MED ORDER — MAGNESIUM SULFATE 2 GM/50ML IV SOLN
2.0000 g | Freq: Once | INTRAVENOUS | Status: AC
  Administered 2017-12-13: 2 g via INTRAVENOUS
  Filled 2017-12-13: qty 50

## 2017-12-13 MED ORDER — ACETAMINOPHEN 325 MG PO TABS
650.0000 mg | ORAL_TABLET | Freq: Four times a day (QID) | ORAL | Status: DC | PRN
  Administered 2017-12-15: 16:00:00 650 mg via ORAL
  Filled 2017-12-13: qty 2

## 2017-12-13 MED ORDER — SODIUM CHLORIDE 0.9 % IV SOLN
INTRAVENOUS | Status: DC
  Administered 2017-12-13 – 2017-12-15 (×4): via INTRAVENOUS

## 2017-12-13 MED ORDER — POTASSIUM CHLORIDE CRYS ER 20 MEQ PO TBCR
40.0000 meq | EXTENDED_RELEASE_TABLET | Freq: Once | ORAL | Status: AC
  Administered 2017-12-13: 18:00:00 40 meq via ORAL
  Filled 2017-12-13: qty 2

## 2017-12-13 MED ORDER — ASPIRIN 325 MG PO TABS
325.0000 mg | ORAL_TABLET | Freq: Every day | ORAL | Status: DC
  Administered 2017-12-14 – 2017-12-15 (×2): 325 mg via ORAL
  Filled 2017-12-13 (×3): qty 1

## 2017-12-13 MED ORDER — HEPARIN SODIUM (PORCINE) 5000 UNIT/ML IJ SOLN
5000.0000 [IU] | Freq: Three times a day (TID) | INTRAMUSCULAR | Status: DC
  Administered 2017-12-13 – 2017-12-14 (×2): 5000 [IU] via SUBCUTANEOUS
  Filled 2017-12-13 (×2): qty 1

## 2017-12-13 MED ORDER — SODIUM CHLORIDE 0.9 % IV BOLUS
1000.0000 mL | Freq: Once | INTRAVENOUS | Status: AC
  Administered 2017-12-13: 1000 mL via INTRAVENOUS

## 2017-12-13 MED ORDER — ASPIRIN EC 81 MG PO TBEC
81.0000 mg | DELAYED_RELEASE_TABLET | Freq: Every day | ORAL | Status: DC
  Filled 2017-12-13: qty 1

## 2017-12-13 MED ORDER — ACETAMINOPHEN 650 MG RE SUPP: 650.0000 mg | Freq: Four times a day (QID) | RECTAL | Status: DC | PRN

## 2017-12-13 MED ORDER — NICOTINE 14 MG/24HR TD PT24
14.0000 mg | MEDICATED_PATCH | Freq: Every day | TRANSDERMAL | Status: DC
  Administered 2017-12-13 – 2017-12-15 (×3): 14 mg via TRANSDERMAL
  Filled 2017-12-13 (×3): qty 1

## 2017-12-13 MED ORDER — STROKE: EARLY STAGES OF RECOVERY BOOK
Freq: Once | Status: AC
  Administered 2017-12-13: 21:00:00

## 2017-12-13 MED ORDER — ATORVASTATIN CALCIUM 20 MG PO TABS
20.0000 mg | ORAL_TABLET | Freq: Every day | ORAL | Status: DC
  Administered 2017-12-13 – 2017-12-14 (×2): 20 mg via ORAL
  Filled 2017-12-13 (×3): qty 1

## 2017-12-13 NOTE — ED Notes (Signed)
Date and time results received: 12/13/17 1545   Test: Troponin Critical Value: 0.05  Name of Provider Notified: Dr. Manson PasseyBrown

## 2017-12-13 NOTE — ED Provider Notes (Signed)
Patient received in sign-out from Dr. Manson PasseyBrown.  Workup and evaluation pending blood work and reassessment.  patient improved with IV fluids. No melena or hematochezia to suggest GI bleed. He suspected volume loss due to recent GI illness. Troponin is roughly at baseline. Does have mild prerenal azotemia consistent with her dehydration. Based on her syncopal episode and evidence of probable lacunar infarct patient will require hospitalization for further medical management.      Willy Eddyobinson, Mont Jagoda, MD 12/13/17 [id number]

## 2017-12-13 NOTE — H&P (Addendum)
[facility] [place] [facility]   PATIENT NAME: Destiny Mack    MR#:  [id number]  DATE OF BIRTH:  1946-03-30  DATE OF ADMISSION:  12/13/2017  PRIMARY CARE PHYSICIAN: Oswaldo ConroyBender, [name], MD   REQUESTING/REFERRING PHYSICIAN: Dr Willy EddyPatrick Robinson  CHIEF COMPLAINT:   Chief Complaint  Patient presents with  . Near Syncope    HISTORY OF PRESENT ILLNESS:  Destiny Mack  is a 72 y.o. female with a known history of recent Campylobacter gastroenteritis.  She states that yesterday she was vomiting a lot.  She states her family sent her in for being unresponsive.  In the ER, she was hypotensive  with a blood pressure of 80/39.  Hospitalist services were contacted for further evaluation.  She states that she feels okay and she offers no complaints.  She states her diarrhea was 2 weeks ago.  She is not having any abdominal pain.  But she did vomit a lot yesterday.  She was also found to have acute kidney injury.  PAST MEDICAL HISTORY:   Past Medical History:  Diagnosis Date  . A-fib (HCC)   . Depression   . Frequent falls   . Hypertension   . Leaking of urine   . Mixed incontinence   . Stroke (HCC)   . Tremor of hands and face     PAST SURGICAL HISTORY:   Past Surgical History:  Procedure Laterality Date  . APPENDECTOMY    . TUBAL LIGATION      SOCIAL HISTORY:   Social History   Tobacco Use  . Smoking status: Current Every Day Smoker    Packs/day: 0.50    Years: 56.00    Pack years: 28.00    Last attempt to quit: 11/22/2017    Years since quitting: 0.0  . Smokeless tobacco: Never Used  Substance Use Topics  . Alcohol use: No    Frequency: Never    FAMILY HISTORY:   Family History  Problem Relation Age of Onset  . Dementia Mother   . Heart failure Mother   . Heart failure Father   . CAD Father     DRUG ALLERGIES:  No Known Allergies  REVIEW OF SYSTEMS:  CONSTITUTIONAL: No fever, fatigue or weakness.  EYES: No blurred or double  vision.  EARS, NOSE, AND THROAT: No tinnitus or ear pain. No sore throat.  Positive for scratchy throat. RESPIRATORY: No cough, shortness of breath, wheezing or hemoptysis.  CARDIOVASCULAR: No chest pain, orthopnea, edema.  GASTROINTESTINAL: Positive for nausea and vomiting.  No further diarrhea or abdominal pain. No blood in bowel movements GENITOURINARY: No dysuria, hematuria.  ENDOCRINE: No polyuria, nocturia,  HEMATOLOGY: No anemia, easy bruising or bleeding SKIN: No rash or lesion. MUSCULOSKELETAL: No joint pain or arthritis.   NEUROLOGIC: No tingling, numbness, weakness.  PSYCHIATRY: No anxiety or depression.   MEDICATIONS AT HOME:   Prior to Admission medications   Medication Sig Start Date End Date Taking? Authorizing Provider  amLODipine (NORVASC) 5 MG tablet Take 5 mg by mouth daily.   Yes [provider]  atorvastatin (LIPITOR) 20 MG tablet Take 20 mg by mouth daily.   Yes [provider]  lisinopril (PRINIVIL,ZESTRIL) 30 MG tablet Take 30 mg by mouth daily.   Yes [provider]      VITAL SIGNS:  Blood pressure (!) 103/54, pulse 77, temperature 97.7 F (36.5 C), temperature source Oral, resp. rate 11, height 5\' 4"  (1.626 m), weight 67.1 kg (148 lb), SpO2 99 %.  PHYSICAL EXAMINATION:  GENERAL:  72 y.o.-year-old patient lying in the bed with no acute distress.  EYES: Pupils equal, round, reactive to light and accommodation. No scleral icterus. Extraocular muscles intact.  HEENT: Head atraumatic, normocephalic. Oropharynx and nasopharynx clear.  NECK:  Supple, no jugular venous distention. No thyroid enlargement, no tenderness.  LUNGS: Normal breath sounds bilaterally, no wheezing, rales,rhonchi or crepitation. No use of accessory muscles of respiration.  CARDIOVASCULAR: S1, S2 normal. No murmurs, rubs, or gallops.  ABDOMEN: Soft, nontender, nondistended. Bowel sounds present. No organomegaly or mass.  EXTREMITIES: No pedal edema, cyanosis, or  clubbing.  NEUROLOGIC: Cranial nerves II through XII are intact. Muscle strength 5/5 in all extremities. Sensation intact. Gait not checked.  PSYCHIATRIC: The patient is alert and oriented x 3.  SKIN: Chronic lower extremity excoriation changes.  LABORATORY PANEL:   CBC Recent Labs  Lab 12/13/17 1349  WBC 11.9*  HGB 13.2  HCT 39.8  PLT 202   ------------------------------------------------------------------------------------------------------------------  Chemistries  Recent Labs  Lab 12/13/17 1349  NA 136  K 3.6  CL 103  CO2 23  GLUCOSE 123*  BUN 35*  CREATININE 1.96*  CALCIUM 8.5*   ------------------------------------------------------------------------------------------------------------------  Cardiac Enzymes Recent Labs  Lab 12/13/17 1349  TROPONINI 0.05*   ------------------------------------------------------------------------------------------------------------------  RADIOLOGY:  Ct Head Wo Contrast  Result Date: 12/13/2017 CLINICAL DATA:  Syncopal episode.  Decreased responsiveness. EXAM: CT HEAD WITHOUT CONTRAST TECHNIQUE: Contiguous axial images were obtained from the base of the skull through the vertex without intravenous contrast. COMPARISON:  MR head 10/29/2017.  CT head 10/29/2017. FINDINGS: Brain: No evidence for acute infarction, hemorrhage, mass lesion, hydrocephalus, or extra-axial fluid. Generalized atrophy. Extensive white matter hypoattenuation favored to represent small vessel disease. Vascular: Calcification of the cavernous internal carotid arteries consistent with cerebrovascular atherosclerotic disease. No signs of intracranial large vessel occlusion. Skull: Normal. Negative for fracture or focal lesion. Sinuses/Orbits: No acute finding. Other: Compared with prior CT and MR, the acute infarcts of the RIGHT internal and external capsule, and LEFT external capsule, demonstrated previously are observed as areas of hypoattenuation on today's  examination. IMPRESSION: Atrophy and small vessel disease. Sequelae of recent lacunar infarcts, now subacute to chronic. No acute intracranial findings are evident. Electronically Signed   By: Elsie Stain M.D.   On: 12/13/2017 14:55    EKG:   Normal sinus rhythm, incomplete right bundle branch block, left anterior fascicular block, flipped T waves laterally  IMPRESSION AND PLAN:   1.  Acute kidney injury, hypotension with nausea vomiting.  Give IV fluid hydration.  Send off urine analysis and urine culture.  Hold antihypertensive medications.  Continue to monitor creatinine on a daily basis. 2.  History of stroke with left-sided weakness.  Restart aspirin.  On Lipitor.  ER physician ordered a MRI of the brain which is still pending 3.  Hypokalemia.  Replace potassium 4.  Elevated troponin could be secondary to demand ischemia from acute kidney injury and hypotension.  Looking back she did have this in the past. 5.  Tobacco abuse.  Smoking cessation counseling done 4 minutes by me.  Nicotine patch ordered.  All the records are reviewed and case discussed with ED provider. Management plans discussed with the patient, and she is in agreement.  CODE STATUS: Full code  TOTAL TIME TAKING CARE OF THIS PATIENT: 50 minutes.    Alford Highland M.D on 12/13/2017 at 4:32 PM  Between 7am to 6pm - Pager - [phone]  After 6pm call admission pager [phone]  [facility] Office  [phone]  CC: Primary care physician; Oswaldo Conroy, MD

## 2017-12-13 NOTE — ED Notes (Signed)
Salvatore DecentCathryn Cox- daughter- [phone]

## 2017-12-13 NOTE — ED Provider Notes (Addendum)
[facility] Emergency Department Provider Note    First MD Initiated Contact with Patient 12/13/17 1434     (approximate)  I have reviewed the triage vital signs and the nursing notes.  level 5 caveat: History Limited secondary to altered mental status HISTORY  Chief Complaint Near Syncope    HPI Destiny Mack is a 72 y.o. female with below his primary medical conditions presents to the emergency department via EMSsecondary to syncopal episode however on arrival the patient informed the nurse that she did not have a syncopal episode" that she feels fine".. Patient was sent to the emergency department waiting room however she had a witnessed syncopal episode while in the waiting room and was then brought to the treatment room I saw the patient. On my arrival to room patient somnolent but does respond appropriately. Patient answering questions without difficulty able to state name and date of birth. Patient denies any pain at present. However patient does have moments where she was unresponsive but then subsequently responds to verbal stimuli   Past Medical History:  Diagnosis Date  . A-fib (HCC)   . Depression   . Frequent falls   . Hypertension   . Leaking of urine   . Mixed incontinence   . Stroke (HCC)   . Tremor of hands and face     Patient Active Problem List   Diagnosis Date Noted  . Diarrhea 11/22/2017  . Campylobacter diarrhea 11/22/2017  . Acute encephalopathy 10/29/2017  . Pressure injury of skin 10/29/2017    Past Surgical History:  Procedure Laterality Date  . TUBAL LIGATION      Prior to Admission medications   Medication Sig Start Date End Date Taking? Authorizing Provider  amLODipine (NORVASC) 5 MG tablet Take 5 mg by mouth daily.   Yes [provider]  atorvastatin (LIPITOR) 20 MG tablet Take 20 mg by mouth daily.   Yes [provider]  lisinopril (PRINIVIL,ZESTRIL) 30 MG tablet Take 30 mg by mouth daily.    Yes [provider]  aspirin EC 325 MG EC tablet Take 1 tablet (325 mg total) by mouth daily. Patient not taking: Reported on 11/21/2017 11/01/17   Delfino LovettShah, Vipul, MD  ciprofloxacin (CIPRO) 250 MG tablet Take 1 tablet (250 mg total) by mouth 2 (two) times daily. Patient not taking: Reported on 12/13/2017 11/23/17   Milagros LollSudini, Srikar, MD  donepezil (ARICEPT) 10 MG tablet Take 10 mg by mouth at bedtime.    [provider]    Allergies no known drug allergies  Family History  Problem Relation Age of Onset  . Dementia Mother   . Heart failure Father     Social History Social History   Tobacco Use  . Smoking status: Former Smoker    Packs/day: 1.50    Years: 15.00    Pack years: 22.50    Last attempt to quit: 11/22/2017    Years since quitting: 0.0  . Smokeless tobacco: Never Used  Substance Use Topics  . Alcohol use: No    Frequency: Never  . Drug use: No    Review of Systems Constitutional: No fever/chills Eyes: No visual changes. ENT: No sore throat. Cardiovascular: Denies chest pain. Respiratory: Denies shortness of breath. Gastrointestinal: No abdominal pain.  No nausea, no vomiting.  No diarrhea.  No constipation. Genitourinary: Negative for dysuria. Musculoskeletal: Negative for neck pain.  Negative for back pain. Integumentary: Negative for rash. Neurological: Negative for headaches, focal weakness or numbness.positive for syncopal  episodes   ____________________________________________   PHYSICAL EXAM:  VITAL SIGNS: ED Triage Vitals  Enc Vitals Group     BP 12/13/17 1339 (!) 132/51     Pulse Rate 12/13/17 1339 63     Resp 12/13/17 1339 18     Temp 12/13/17 1339 97.7 F (36.5 C)     Temp Source 12/13/17 1339 Oral     SpO2 12/13/17 1339 99 %     Weight 12/13/17 1339 67.1 kg (148 lb)     Height 12/13/17 1339 1.626 m (5\' 4" )     Head Circumference --      Peak Flow --      Pain Score 12/13/17 1348 0     Pain Loc --      Pain Edu? --       Excl. in GC? --     Constitutional: somnolent however Alert and oriented. Well appearing and in no acute distress. Eyes: Conjunctivae are normal. PERRL. EOMI. Head: Atraumatic. Mouth/Throat: Mucous membranes are moist.  Oropharynx non-erythematous. Neck: No stridor.   Cardiovascular: Normal rate, regular rhythm. Good peripheral circulation. Grossly normal heart sounds. Respiratory: Normal respiratory effort.  No retractions. Lungs CTAB. Gastrointestinal: Soft and nontender. No distention.  Musculoskeletal: No lower extremity tenderness nor edema. No gross deformities of extremities. Neurologic: slurred speech. No gross focal neurologic deficits are appreciated.  Skin:  Skin is warm, dry and intact. No rash noted. Psychiatric: Mood and affect are normal. Speech and behavior are normal.  ____________________________________________   LABS (all labs ordered are listed, but only abnormal results are displayed)  Labs Reviewed  BASIC METABOLIC PANEL - Abnormal; Notable for the following components:      Result Value   Glucose, Bld 123 (*)    BUN 35 (*)    Creatinine, Ser 1.96 (*)    Calcium 8.5 (*)    GFR calc non Af Amer 24 (*)    GFR calc Af Amer 28 (*)    All other components within normal limits  CBC - Abnormal; Notable for the following components:   WBC 11.9 (*)    RDW 16.6 (*)    All other components within normal limits  GLUCOSE, CAPILLARY - Abnormal; Notable for the following components:   Glucose-Capillary 124 (*)    All other components within normal limits  URINALYSIS, COMPLETE (UACMP) WITH MICROSCOPIC  TROPONIN I  CBG MONITORING, ED   ____________________________________________  EKG  ED ECG REPORT I, Damon N Bailey Faiella, the attending physician, personally viewed and interpreted this ECG.   Date: 12/13/2017  EKG Time: 1:54 PM  Rate: 62  Rhythm:normal sinus rhythm  Axis: normal  Intervals:normal  ST&T Change:  none  ____________________________________________  RADIOLOGY I,  N Giannie Soliday, personally viewed and evaluated these images (plain radiographs) as part of my medical decision making, as well as reviewing the written report by the radiologist.  ED MD interpretation:  no acute intracranial findings noted on CT head  Official radiology report(s): Ct Head Wo Contrast  Result Date: 12/13/2017 CLINICAL DATA:  Syncopal episode.  Decreased responsiveness. EXAM: CT HEAD WITHOUT CONTRAST TECHNIQUE: Contiguous axial images were obtained from the base of the skull through the vertex without intravenous contrast. COMPARISON:  MR head 10/29/2017.  CT head 10/29/2017. FINDINGS: Brain: No evidence for acute infarction, hemorrhage, mass lesion, hydrocephalus, or extra-axial fluid. Generalized atrophy. Extensive white matter hypoattenuation favored to represent small vessel disease. Vascular: Calcification of the cavernous internal carotid arteries consistent with cerebrovascular atherosclerotic disease. No signs of  intracranial large vessel occlusion. Skull: Normal. Negative for fracture or focal lesion. Sinuses/Orbits: No acute finding. Other: Compared with prior CT and MR, the acute infarcts of the RIGHT internal and external capsule, and LEFT external capsule, demonstrated previously are observed as areas of hypoattenuation on today's examination. IMPRESSION: Atrophy and small vessel disease. Sequelae of recent lacunar infarcts, now subacute to chronic. No acute intracranial findings are evident. Electronically Signed   By: Elsie Stain M.D.   On: 12/13/2017 14:55    ___________________ Procedures   ____________________________________________   INITIAL IMPRESSION / ASSESSMENT AND PLAN / ED COURSE  As part of my medical decision making, I reviewed the following data within the electronic MEDICAL RECORD NUMBER   72 year old female presenting with above stated history of physical exam following multiple  syncopal episodes. Patient noted to be hypotensive on my arrival to the room with blood pressure100/47 and subsequently 80/39 with a heart rate of 48. 2 L IV normal saline currently being administered BP improving with blood pressure now 112/44.heart Rate of 61. CT scan of the head revealed no acute intracranial abnormalities however given concern for possible CVA versus posterior circulation pathology MRI will be performed.patient's care transferred to Dr. Roxan Hockey. ________________________________  FINAL CLINICAL IMPRESSION(S) / ED DIAGNOSES  Final diagnoses:  Syncope and collapse  Orthostatic hypotension  CVA   MEDICATIONS GIVEN DURING THIS VISIT:  Medications  sodium chloride 0.9 % bolus 1,000 mL (1,000 mLs Intravenous New Bag/Given 12/13/17 1449)  sodium chloride 0.9 % bolus 1,000 mL (1,000 mLs Intravenous New Bag/Given 12/13/17 1454)     ED Discharge Orders    None       Note:  This document was prepared using Dragon voice recognition software and may include unintentional dictation errors.    Darci Current, MD 12/14/17 1610    Darci Current, MD 12/14/17 [phone]

## 2017-12-13 NOTE — ED Triage Notes (Signed)
Pt from home via [facility], pt reports that her daughter called 911 because she had a syncopal episode. Pt reports that she has not had a syncopal episode and that she feels fine. Pt is alert and oriented. VSS. NAD.

## 2017-12-13 NOTE — ED Notes (Signed)
Attempted to call daughter to update her on admission but number didn't work.

## 2017-12-13 NOTE — ED Notes (Signed)
Pt wheeled to room 5, slumped over. Picked up by multiple staff members to get pt lying down on stretcher. Pt unresponsive to painful stimuli initially. Dr. Manson PasseyBrown called to room. Pt started talking and moving slightly. Able to raise legs off bed an inch or two. Denies pain. Pt admits to feeling sleepy. Voice clear when talking.

## 2017-12-13 NOTE — ED Notes (Signed)
Admitting at bedside 

## 2017-12-13 NOTE — Progress Notes (Signed)
Patient ID: Destiny BeringLinda M Mack, female   DOB: 09-Nov-1945, 72 y.o.   MRN: [id number] Reviewed MRI of the brain.  Multiple strokes seen. Change aspirin 81 mg to 325 mg. Neurology consultation, speech therapy OT and PT consultations.

## 2017-12-13 NOTE — ED Notes (Signed)
Pt states she fell yesterday, hitting back of head. States fell in kitchen on tile floor. Denies LOC. Denies blood thinner use. Denies dizziness prior to fall, states she tripped. Denies any other recent falls. Denies blurred vision. Talking in clear voice at this time. Keeps eyes closed. Moving arms and legs a little more than before. Pt is easier to arouse at this time then when she was wheeled back to room.

## 2017-12-13 NOTE — ED Notes (Signed)
Pt taken to MRI  

## 2017-12-14 DIAGNOSIS — I639 Cerebral infarction, unspecified: Secondary | ICD-10-CM

## 2017-12-14 LAB — CBC
HEMATOCRIT: 34.6 % — AB (ref 35.0–47.0)
HEMOGLOBIN: 11.5 g/dL — AB (ref 12.0–16.0)
MCH: 29.8 pg (ref 26.0–34.0)
MCHC: 33.3 g/dL (ref 32.0–36.0)
MCV: 89.3 fL (ref 80.0–100.0)
Platelets: 145 10*3/uL — ABNORMAL LOW (ref 150–440)
RBC: 3.87 MIL/uL (ref 3.80–5.20)
RDW: 17.1 % — ABNORMAL HIGH (ref 11.5–14.5)
WBC: 7.1 10*3/uL (ref 3.6–11.0)

## 2017-12-14 LAB — URINALYSIS, COMPLETE (UACMP) WITH MICROSCOPIC
Bilirubin Urine: NEGATIVE
Glucose, UA: NEGATIVE mg/dL
Ketones, ur: NEGATIVE mg/dL
Nitrite: NEGATIVE
Protein, ur: 30 mg/dL — AB
Specific Gravity, Urine: 1.015 (ref 1.005–1.030)
pH: 5 (ref 5.0–8.0)

## 2017-12-14 LAB — BASIC METABOLIC PANEL
ANION GAP: 5 (ref 5–15)
BUN: 29 mg/dL — ABNORMAL HIGH (ref 6–20)
CO2: 21 mmol/L — ABNORMAL LOW (ref 22–32)
Calcium: 7.9 mg/dL — ABNORMAL LOW (ref 8.9–10.3)
Chloride: 114 mmol/L — ABNORMAL HIGH (ref 101–111)
Creatinine, Ser: 1.38 mg/dL — ABNORMAL HIGH (ref 0.44–1.00)
GFR, EST AFRICAN AMERICAN: 43 mL/min — AB (ref >60)
GFR, EST NON AFRICAN AMERICAN: 37 mL/min — AB (ref >60)
GLUCOSE: 82 mg/dL (ref 65–99)
Potassium: 3.6 mmol/L (ref 3.5–5.1)
Sodium: 140 mmol/L (ref 135–145)

## 2017-12-14 LAB — MAGNESIUM: MAGNESIUM: 2.5 mg/dL — AB (ref 1.7–2.4)

## 2017-12-14 MED ORDER — APIXABAN 5 MG PO TABS
5.0000 mg | ORAL_TABLET | Freq: Two times a day (BID) | ORAL | Status: DC
  Administered 2017-12-14 – 2017-12-15 (×2): 5 mg via ORAL
  Filled 2017-12-14 (×2): qty 1

## 2017-12-14 MED ORDER — POTASSIUM CHLORIDE CRYS ER 20 MEQ PO TBCR
40.0000 meq | EXTENDED_RELEASE_TABLET | Freq: Once | ORAL | Status: AC
  Administered 2017-12-14: 40 meq via ORAL
  Filled 2017-12-14 (×2): qty 2

## 2017-12-14 MED ORDER — SODIUM CHLORIDE 0.9 % IV SOLN
1.0000 g | Freq: Every day | INTRAVENOUS | Status: DC
  Administered 2017-12-14 – 2017-12-15 (×2): 1 g via INTRAVENOUS
  Filled 2017-12-14 (×3): qty 10

## 2017-12-14 MED ORDER — SODIUM CHLORIDE 0.9 % IV SOLN
1.0000 g | INTRAVENOUS | Status: DC
  Filled 2017-12-14: qty 10

## 2017-12-14 NOTE — Consult Note (Signed)
Referring Physician: Pyreddy    Chief Complaint: Near syncope  HPI: Destiny Mack is an 72 y.o. female presenting after being found unresponsive by family on yesterday.  Had been vomiting a lot that day.  Was hypotensive in the ED.  Had diarrhea a couple of weeks ago.  Was noted to have AKI as well.   Patient recently discharged on 3/29 when diagnosed with embolic infarcts. Not started on anticoagulation since patient not compliant with medications.  Continues to admit to noncompliance today.    Date last known well: Unable to determine Time last known well: Unable to determine tPA Given: No: Unable to determine LKW  Past Medical History:  Diagnosis Date  . A-fib (HCC)   . Depression   . Frequent falls   . Hypertension   . Leaking of urine   . Mixed incontinence   . Stroke (HCC)   . Tremor of hands and face     Past Surgical History:  Procedure Laterality Date  . APPENDECTOMY    . TUBAL LIGATION      Family History  Problem Relation Age of Onset  . Dementia Mother   . Heart failure Mother   . Heart failure Father   . CAD Father    Social History:  reports that she has been smoking.  She has a 28.00 pack-year smoking history. She has never used smokeless tobacco. She reports that she does not drink alcohol or use drugs.  Allergies: No Known Allergies  Medications:  I have reviewed the patient's current medications. Prior to Admission:  Medications Prior to Admission  Medication Sig Dispense Refill Last Dose  . amLODipine (NORVASC) 5 MG tablet Take 5 mg by mouth daily.   UNKNOWN at UNKNOWN  . atorvastatin (LIPITOR) 20 MG tablet Take 20 mg by mouth daily.   UNKNOWN at UNKNOWN  . lisinopril (PRINIVIL,ZESTRIL) 30 MG tablet Take 30 mg by mouth daily.   UNKNOWN at UNKNOWN   Scheduled: . aspirin  325 mg Oral Daily  . atorvastatin  20 mg Oral q1800  . heparin  5,000 Units Subcutaneous Q8H  . nicotine  14 mg Transdermal Daily    ROS: History obtained from the  patient  General ROS: negative for - chills, fatigue, fever, night sweats, weight gain or weight loss Psychological ROS: negative for - behavioral disorder, hallucinations, memory difficulties, mood swings or suicidal ideation Ophthalmic ROS: negative for - blurry vision, double vision, eye pain or loss of vision ENT ROS: negative for - epistaxis, nasal discharge, oral lesions, sore throat, tinnitus or vertigo Allergy and Immunology ROS: negative for - hives or itchy/watery eyes Hematological and Lymphatic ROS: negative for - bleeding problems, bruising or swollen lymph nodes Endocrine ROS: negative for - galactorrhea, hair pattern changes, polydipsia/polyuria or temperature intolerance Respiratory ROS: negative for - cough, hemoptysis, shortness of breath or wheezing Cardiovascular ROS: negative for - chest pain, dyspnea on exertion, edema or irregular heartbeat Gastrointestinal ROS: diarrhea, vomiting  Genito-Urinary ROS: negative for - dysuria, hematuria, incontinence or urinary frequency/urgency Musculoskeletal ROS: negative for - joint swelling or muscular weakness Neurological ROS: as noted in HPI Dermatological ROS: negative for rash and skin lesion changes  Physical Examination: Blood pressure (!) 141/54, pulse 72, temperature 97.9 F (36.6 C), temperature source Oral, resp. rate 16, height 5\' 4"  (1.626 m), weight 67.1 kg (148 lb), SpO2 98 %.  HEENT-  Normocephalic, no lesions, without obvious abnormality.  Normal external eye and conjunctiva.  Normal TM's bilaterally.  Normal auditory  canals and external ears. Normal external nose, mucus membranes and septum.  Normal pharynx. Cardiovascular- S1, S2 normal, pulses palpable throughout   Lungs- chest clear, no wheezing, rales, normal symmetric air entry Abdomen- soft, non-tender; bowel sounds normal; no masses,  no organomegaly Extremities- no edema Lymph-no adenopathy palpable Musculoskeletal-no joint tenderness, deformity or  swelling Skin-warm and dry, no hyperpigmentation, vitiligo, or suspicious lesions  Neurological Examination   Mental Status: Alert and awake.  Speech fluent without evidence of aphasia.  Able to follow 3 step commands without difficulty. Cranial Nerves: II: Discs flat bilaterally; Visual fields grossly normal, pupils equal, round, reactive to light and accommodation III,IV, VI: ptosis not present, extra-ocular motions intact bilaterally V,VII: smile symmetric, facial light touch sensation normal bilaterally VIII: hearing normal bilaterally IX,X: gag reflex present XI: bilateral shoulder shrug XII: midline tongue extension Motor: Right : Upper extremity   4/5    Left:     Upper extremity   5/5  Lower extremity   4-/5     Lower extremity   5/5 Tone and bulk:normal tone throughout; no atrophy noted Sensory: Pinprick and light touch intact throughout, bilaterally Deep Tendon Reflexes: 2+ in the upper extremities and absent in the lower extremities    Plantars: Right: upgoing                         Left: upgoing Cerebellar: Unable to perform due to ability to follow commands.   Gait: not tested due to safety concerns.      Laboratory Studies:  Basic Metabolic Panel: Recent Labs  Lab 12/13/17 1349 12/14/17 0525  NA 136 140  K 3.6 3.6  CL 103 114*  CO2 23 21*  GLUCOSE 123* 82  BUN 35* 29*  CREATININE 1.96* 1.38*  CALCIUM 8.5* 7.9*    Liver Function Tests: No results for input(s): AST, ALT, ALKPHOS, BILITOT, PROT, ALBUMIN in the last 168 hours. No results for input(s): LIPASE, AMYLASE in the last 168 hours. No results for input(s): AMMONIA in the last 168 hours.  CBC: Recent Labs  Lab 12/13/17 1349 12/14/17 0525  WBC 11.9* 7.1  HGB 13.2 11.5*  HCT 39.8 34.6*  MCV 89.4 89.3  PLT 202 145*    Cardiac Enzymes: Recent Labs  Lab 12/13/17 1349  TROPONINI 0.05*    BNP: Invalid input(s): POCBNP  CBG: Recent Labs  Lab 12/13/17 1426  GLUCAP 124*     Microbiology: Results for orders placed or performed during the hospital encounter of 11/21/17  C difficile quick scan w PCR reflex     Status: None   Collection Time: 11/21/17  1:28 PM  Result Value Ref Range Status   C Diff antigen NEGATIVE NEGATIVE Final   C Diff toxin NEGATIVE NEGATIVE Final   C Diff interpretation No C. difficile detected.  Final    Comment: Performed at [facility], [address], [place], [place] [address]  Urine Culture     Status: None   Collection Time: 11/21/17  1:28 PM  Result Value Ref Range Status   Specimen Description   Final    URINE, RANDOM Performed at [facility], [address], [place], [place] [address]    Special Requests   Final    NONE Performed at [facility], [address], [place], [place] [address]    Culture   Final    NO GROWTH Performed at [facility], [address] [place], [place] [address]  Report Status 11/23/2017 FINAL  Final  Gastrointestinal Panel by PCR , Stool     Status: Abnormal   Collection Time: 11/21/17  1:48 PM  Result Value Ref Range Status   Campylobacter species DETECTED (A) NOT DETECTED Final    Comment: RESULT CALLED TO, READ BACK BY AND VERIFIED WITH: CASSIE [facility] 11/21/17 1742 KLW    Plesimonas shigelloides NOT DETECTED NOT DETECTED Final   Salmonella species NOT DETECTED NOT DETECTED Final   Yersinia enterocolitica NOT DETECTED NOT DETECTED Final   Vibrio species NOT DETECTED NOT DETECTED Final   Vibrio cholerae NOT DETECTED NOT DETECTED Final   Enteroaggregative E coli (EAEC) NOT DETECTED NOT DETECTED Final   Enteropathogenic E coli (EPEC) NOT DETECTED NOT DETECTED Final   Enterotoxigenic E coli (ETEC) NOT DETECTED NOT DETECTED Final   Shiga like toxin producing E coli (STEC) NOT DETECTED NOT DETECTED Final   Shigella/Enteroinvasive E coli (EIEC) NOT DETECTED NOT DETECTED Final   Cryptosporidium NOT DETECTED NOT DETECTED Final   Cyclospora  cayetanensis NOT DETECTED NOT DETECTED Final   Entamoeba histolytica NOT DETECTED NOT DETECTED Final   Giardia lamblia NOT DETECTED NOT DETECTED Final   Adenovirus F40/41 NOT DETECTED NOT DETECTED Final   Astrovirus NOT DETECTED NOT DETECTED Final   Norovirus GI/GII NOT DETECTED NOT DETECTED Final   Rotavirus A NOT DETECTED NOT DETECTED Final   Sapovirus (I, II, IV, and V) NOT DETECTED NOT DETECTED Final    Comment: Performed at [facility], [address] [place], [place] [address]    Coagulation Studies: No results for input(s): LABPROT, INR in the last 72 hours.  Urinalysis:  Recent Labs  Lab 12/14/17 1057  COLORURINE YELLOW*  LABSPEC 1.015  PHURINE 5.0  GLUCOSEU NEGATIVE  HGBUR LARGE*  BILIRUBINUR NEGATIVE  KETONESUR NEGATIVE  PROTEINUR 30*  NITRITE NEGATIVE  LEUKOCYTESUR LARGE*    Lipid Panel:    Component Value Date/Time   CHOL 128 10/31/2017 0424   TRIG 81 10/31/2017 0424   HDL 37 (L) 10/31/2017 0424   CHOLHDL 3.5 10/31/2017 0424   VLDL 16 10/31/2017 0424   LDLCALC 75 10/31/2017 0424    HgbA1C:  Lab Results  Component Value Date   HGBA1C 5.6 10/31/2017    Urine Drug Screen:  No results found for: LABOPIA, COCAINSCRNUR, LABBENZ, AMPHETMU, THCU, LABBARB  Alcohol Level: No results for input(s): ETH in the last 168 hours.  Other results: EKG: normal sinus rhythm at 62 bpm.  Imaging: Ct Head Wo Contrast  Result Date: 12/13/2017 CLINICAL DATA:  Syncopal episode.  Decreased responsiveness. EXAM: CT HEAD WITHOUT CONTRAST TECHNIQUE: Contiguous axial images were obtained from the base of the skull through the vertex without intravenous contrast. COMPARISON:  MR head 10/29/2017.  CT head 10/29/2017. FINDINGS: Brain: No evidence for acute infarction, hemorrhage, mass lesion, hydrocephalus, or extra-axial fluid. Generalized atrophy. Extensive white matter hypoattenuation favored to represent small vessel disease. Vascular: Calcification of the cavernous  internal carotid arteries consistent with cerebrovascular atherosclerotic disease. No signs of intracranial large vessel occlusion. Skull: Normal. Negative for fracture or focal lesion. Sinuses/Orbits: No acute finding. Other: Compared with prior CT and MR, the acute infarcts of the RIGHT internal and external capsule, and LEFT external capsule, demonstrated previously are observed as areas of hypoattenuation on today's examination. IMPRESSION: Atrophy and small vessel disease. Sequelae of recent lacunar infarcts, now subacute to chronic. No acute intracranial findings are evident. Electronically Signed   By: Elsie Stain M.D.   On: 12/13/2017 14:55  Mr Angiogram Head Wo Contrast  Result Date: 12/13/2017 CLINICAL DATA:  Syncopal episode today. EXAM: MRI HEAD WITHOUT CONTRAST MRA HEAD WITHOUT CONTRAST TECHNIQUE: Multiplanar, multiecho pulse sequences of the brain and surrounding structures were obtained without intravenous contrast. Angiographic images of the head were obtained using MRA technique without contrast. COMPARISON:  Head CT 12/13/2017.  Head MRI/MRA 10/29/2017. FINDINGS: MRI HEAD FINDINGS Brain: There is a 1.7 cm acute infarct in the posteromedial left occipital lobe. An acute curvilinear infarct is noted in the left parietal lobe extending from the periventricular to the subcortical white matter. There are also acute subcentimeter periventricular white matter infarcts in the right temporal lobe. A 2 mm focus of mild trace diffusion hyperintensity in the left corona radiata is new from the prior MRI but is without clearly reduced ADC to confirm an acute infarct though evaluation is limited by its small size. The small acute infarcts on the prior MRI are now largely late subacute in appearance with some early developing encephalomalacia. There is mild residual trace diffusion signal abnormality in the left centrum semiovale and anterior right external capsule corresponding to old infarcts which were  present on the prior MRI. No intracranial hemorrhage, mass, midline shift, or extra-axial fluid collection is identified. Chronic lacunar infarcts are noted in the right corona radiata, left cerebellum, and left paramedian pons. Patchy T2 hyperintensities in the cerebral white matter and pons are nonspecific but compatible with moderately extensive chronic small vessel ischemic disease, similar to the prior MRI. There is mild cerebral atrophy. Vascular: Major intracranial vascular flow voids are preserved. Skull and upper cervical spine: Unremarkable bone marrow signal. Sinuses/Orbits: Unremarkable orbits. Trace left mastoid fluid. Minimal paranasal sinus mucosal thickening. Other: None. MRA HEAD FINDINGS The study is moderately motion degraded. There is near complete signal loss in both V4 segments which may be artifactual given the symmetry as well as preserved flow related enhancement in both distal V3 segments, however reduced flow from underlying stenoses is not excluded. Signal loss extends to the vertebrobasilar junction, partially obscuring the proximal most basilar artery. The remainder of the basilar artery is widely patent. There is mild fullness of the basilar tip which is favored to reflect the confluence of the PCA and SCA origins, without a saccular aneurysm identified. Posterior communicating arteries are present bilaterally, right larger than left. The right P1 segment is likely very hypoplastic, and there is milder hypoplasia of the left P1 segment. The P2 segments are grossly patent, however motion limits detailed assessment. The distal right cervical ICA is tortuous. The intracranial ICAs are widely patent. M1 segments and MCA bifurcations are grossly patent, however motion artifact results in signal loss particularly notable in the right greater than left proximal M2 branches which limits assessment. The ACAs are patent with motion artifact through the A1 and proximal A2 segments limiting  evaluation for stenosis. No large aneurysm is identified. IMPRESSION: 1. Small acute infarcts in the left occipital lobe and white matter of the left parietal and right temporal lobes. 2. Multiple late subacute and chronic infarcts as above. 3. Extensive chronic small vessel ischemic disease. 4. Motion degraded head MRA without large vessel occlusion in the anterior circulation. Signal loss throughout both V4 segments through the vertebrobasilar junction is favored to be artifactual but results in nondiagnostic assessment of these vessels. Electronically Signed   By: Sebastian Ache M.D.   On: 12/13/2017 17:26   Mr Brain Wo Contrast  Result Date: 12/13/2017 CLINICAL DATA:  Syncopal episode today. EXAM: MRI HEAD  WITHOUT CONTRAST MRA HEAD WITHOUT CONTRAST TECHNIQUE: Multiplanar, multiecho pulse sequences of the brain and surrounding structures were obtained without intravenous contrast. Angiographic images of the head were obtained using MRA technique without contrast. COMPARISON:  Head CT 12/13/2017.  Head MRI/MRA 10/29/2017. FINDINGS: MRI HEAD FINDINGS Brain: There is a 1.7 cm acute infarct in the posteromedial left occipital lobe. An acute curvilinear infarct is noted in the left parietal lobe extending from the periventricular to the subcortical white matter. There are also acute subcentimeter periventricular white matter infarcts in the right temporal lobe. A 2 mm focus of mild trace diffusion hyperintensity in the left corona radiata is new from the prior MRI but is without clearly reduced ADC to confirm an acute infarct though evaluation is limited by its small size. The small acute infarcts on the prior MRI are now largely late subacute in appearance with some early developing encephalomalacia. There is mild residual trace diffusion signal abnormality in the left centrum semiovale and anterior right external capsule corresponding to old infarcts which were present on the prior MRI. No intracranial hemorrhage,  mass, midline shift, or extra-axial fluid collection is identified. Chronic lacunar infarcts are noted in the right corona radiata, left cerebellum, and left paramedian pons. Patchy T2 hyperintensities in the cerebral white matter and pons are nonspecific but compatible with moderately extensive chronic small vessel ischemic disease, similar to the prior MRI. There is mild cerebral atrophy. Vascular: Major intracranial vascular flow voids are preserved. Skull and upper cervical spine: Unremarkable bone marrow signal. Sinuses/Orbits: Unremarkable orbits. Trace left mastoid fluid. Minimal paranasal sinus mucosal thickening. Other: None. MRA HEAD FINDINGS The study is moderately motion degraded. There is near complete signal loss in both V4 segments which may be artifactual given the symmetry as well as preserved flow related enhancement in both distal V3 segments, however reduced flow from underlying stenoses is not excluded. Signal loss extends to the vertebrobasilar junction, partially obscuring the proximal most basilar artery. The remainder of the basilar artery is widely patent. There is mild fullness of the basilar tip which is favored to reflect the confluence of the PCA and SCA origins, without a saccular aneurysm identified. Posterior communicating arteries are present bilaterally, right larger than left. The right P1 segment is likely very hypoplastic, and there is milder hypoplasia of the left P1 segment. The P2 segments are grossly patent, however motion limits detailed assessment. The distal right cervical ICA is tortuous. The intracranial ICAs are widely patent. M1 segments and MCA bifurcations are grossly patent, however motion artifact results in signal loss particularly notable in the right greater than left proximal M2 branches which limits assessment. The ACAs are patent with motion artifact through the A1 and proximal A2 segments limiting evaluation for stenosis. No large aneurysm is identified.  IMPRESSION: 1. Small acute infarcts in the left occipital lobe and white matter of the left parietal and right temporal lobes. 2. Multiple late subacute and chronic infarcts as above. 3. Extensive chronic small vessel ischemic disease. 4. Motion degraded head MRA without large vessel occlusion in the anterior circulation. Signal loss throughout both V4 segments through the vertebrobasilar junction is favored to be artifactual but results in nondiagnostic assessment of these vessels. Electronically Signed   By: Sebastian Ache M.D.   On: 12/13/2017 17:26    Assessment: 72 y.o. female presenting with hypotension.  MRI of the brain performed and shows multiple small acute infarcts.  Patient with a history of atrial fibrillation.  Infarcts likely embolic in etiology.  Patient has not been an anticoagulation candidate due to history of noncompliance.  If this issue can be addressed patient should be on Eliquis.  Otherwise patient to remain on ASA.  Recent stroke work up performed in march does not need to be repeated.    Stroke Risk Factors - atrial fibrillation and hypertension  Plan: 1. PT consult, OT consult, Speech consult 2. Telemetry monitoring 3. Frequent neuro checks   Thana FarrLeslie Billi Bright, MD Neurology [phone] 12/14/2017, 11:39 AM

## 2017-12-14 NOTE — Evaluation (Signed)
Physical Therapy Evaluation Patient Details Name: Destiny Mack MRN: [id number] DOB: June 27, 1946 Today's Date: 12/14/2017   History of Present Illness  Pt. is a 72 y.o. female who was admitted to [facility] with low BP of 80/39, unresponsiveness after several days of nausea, and vomiting. Imaging revealed Small acute infarcts in the left occipiatal lobe, left parietal lobe, and right temporal lob. Multiple late subacute, and chronic infarcts with extensive chronic small vessel ischemic disease. Pt. PMHx includes: AKI, A-Fib, Depression, Frequent Falls, HTN, mixed incontinence, CVA, Tremor of hands, and face, Appendectomy, and tubal Ligation.  Clinical Impression  Pt is a 72 year old female who lives in a two story home with her children.  She reports being dependent with bathing and dressing at times and that she is able to ambulate to her bathroom with RW but that she is primarily in a WC during the day.  Pt in recliner upon PT arrival and reported no pain but that she is feeling fatigued and weaker than normal.  She presented with overall weakness of bilateral UE and slightly decreased strength of bilateral LE.  Pt was able to stand from recliner without physical assist and demonstrated knowledge of proper use of RW. She was able to walk along her bedside with RW and supervision.  Pt reported fatigue and that she needed to return to recliner.  She was able to demonstrate proper sequencing for safe stand to sit with RW.  Pt will benefit from skilled PT for strength, tolerance to activity and tolerance to activity.    Follow Up Recommendations Home health PT    Equipment Recommendations  None recommended by PT    Recommendations for Other Services       Precautions / Restrictions Precautions Precautions: Fall Restrictions Weight Bearing Restrictions: No      Mobility  Bed Mobility Overal bed mobility: (Pt in chair upon PT arrival.)                Transfers Overall transfer level: Needs  assistance Equipment used: Rolling walker (2 wheeled) Transfers: Sit to/from Stand Sit to Stand: Supervision         General transfer comment: Pt able to initiate and complete STS from recliner without difficulty.  Demonstrates safe use of RW and states that she has been working with home health physical therapy which has helped her with mobility.  Presents with low to moderate foot clearance, and decreased step length.  Ambulation/Gait Ambulation/Gait assistance: Supervision Ambulation Distance (Feet): 20 Feet Assistive device: Rolling walker (2 wheeled)       General Gait Details: Pt ambulated with RW by bedside, stating that she was too tired to walk any further and would like to return to her chair.  Stairs            Wheelchair Mobility    Modified Rankin (Stroke Patients Only)       Balance Overall balance assessment: Modified Independent                                           Pertinent Vitals/Pain Pain Assessment: 0-10    Home Living Family/patient expects to be discharged to:: Private residence Living Arrangements: Children(Resides with daughter) Available Help at Discharge: Family;Available 24 hours/day Type of Home: Apartment Home Access: Level entry     Home Layout: Two level Home Equipment: Bedside commode;Wheelchair - manual Additional Comments: No  family present to confirm information provided by pt, pt is poor historian    Prior Function Level of Independence: Needs assistance   Gait / Transfers Assistance Needed: Pt. uses a w/c, transfers to nd from the w/c.  ADL's / Homemaking Assistance Needed: Pt.'s daughter assists pt. with morning ADLs, taking sponge baths, IADLs including: meal preparation, and medication management, and transportation.         Hand Dominance   Dominant Hand: Right    Extremity/Trunk Assessment   Upper Extremity Assessment Upper Extremity Assessment: Generalized weakness    Lower  Extremity Assessment Lower Extremity Assessment: Generalized weakness    Cervical / Trunk Assessment Cervical / Trunk Assessment: Kyphotic  Communication   Communication: Expressive difficulties  Cognition Arousal/Alertness: Awake/alert Behavior During Therapy: WFL for tasks assessed/performed Overall Cognitive Status: No family/caregiver present to determine baseline cognitive functioning                                        General Comments      Exercises     Assessment/Plan    PT Assessment    PT Problem List         PT Treatment Interventions      PT Goals (Current goals can be found in the Care Plan section)  Acute Rehab PT Goals Patient Stated Goal: To return home and resume home health PT. PT Goal Formulation: With patient Time For Goal Achievement: 12/28/17 Potential to Achieve Goals: Good    Frequency Min 2X/week   Barriers to discharge        Co-evaluation               AM-PAC PT "6 Clicks" Daily Activity  Outcome Measure Difficulty turning over in bed (including adjusting bedclothes, sheets and blankets)?: A Little Difficulty moving from lying on back to sitting on the side of the bed? : A Little Difficulty sitting down on and standing up from a chair with arms (e.g., wheelchair, bedside commode, etc,.)?: A Little Help needed moving to and from a bed to chair (including a wheelchair)?: A Little Help needed walking in hospital room?: A Little Help needed climbing 3-5 steps with a railing? : A Little 6 Click Score: 18    End of Session Equipment Utilized During Treatment: Gait belt Activity Tolerance: Patient limited by fatigue;Patient tolerated treatment well Patient left: in chair;with call bell/phone within reach;with chair alarm set Nurse Communication: Mobility status PT Visit Diagnosis: Unsteadiness on feet (R26.81);Muscle weakness (generalized) (M62.81)    Time: 1100-1130 PT Time Calculation (min) (ACUTE ONLY): 30  min   Charges:   PT Evaluation $PT Eval Low Complexity: 1 Low     PT G Codes:   PT G-Codes **NOT FOR INPATIENT CLASS** Functional Assessment Tool Used: AM-PAC 6 Clicks Basic Mobility    Glenetta HewSarah Mayme Profeta, PT, DPT   Glenetta HewSarah Knox Cervi 12/14/2017, 1:18 PM

## 2017-12-14 NOTE — Consult Note (Signed)
Pharmacy Antibiotic Note  Roselyn BeringLinda M Perezperez is a 72 y.o. female admitted on 12/13/2017 with UTI, AKI, stroke.  Pharmacy has been consulted for ceftriaxone dosing.   Plan: Rocephin 1 gram IV q24h  Height: 5\' 4"  (162.6 cm) Weight: 148 lb (67.1 kg) IBW/kg (Calculated) : 54.7  Temp (24hrs), Avg:98.1 F (36.7 C), Min:97.7 F (36.5 C), Max:98.9 F (37.2 C)  Recent Labs  Lab 12/13/17 1349 12/14/17 0525  WBC 11.9* 7.1  CREATININE 1.96* 1.38*    Estimated Creatinine Clearance: 34.7 mL/min (A) (by C-G formula based on SCr of 1.38 mg/dL (H)).    No Known Allergies  Antimicrobials this admission: 4/19 Rocephin >>   Microbiology results: 4/19 UCx: pending  Thank you for allowing pharmacy to be a part of this patient's care.  Lowella BandyRodney D Demarrius Guerrero 12/14/2017 12:10 PM

## 2017-12-14 NOTE — Evaluation (Signed)
Occupational Therapy Evaluation Patient Details Name: Destiny Mack MRN: [id number] DOB: [birth year] Today's Date: 12/14/2017    History of Present Illness Pt. is a 72 y.o. female who was admitted to [facility] with low BP of 80/39, unresponsiveness after several days of nausea, and vomiting. Imaging revealed Small acute infarcts in the left occipiatal lobe, left parietal lobe, and right temporal lob. Multiple late subacute, and chronic infarcts with extensive chronic small vessel ischemic disease. Pt. PMHx includes: AKI, A-Fib, Depression, Frequent Falls, HTN, mixed incontinence, CVA, Tremor of hands, and face, Appendectomy, and tubal Ligation.   Clinical Impression   Pt. Presents with weakness, limited RUE ROMlimited activity tolerance, and limited functional mobility which limits the ability to complete basic ADL and IADL functioning.  Pt. Resides at home with her daughter. Pt. Required assistance with basic ADL, and IADL tasks from her daughter. Pt. required assist with meal preparation, medication management, and performing home management, household tasks, and provided transportation. Pt. Mostly took sponge baths, used a w/c for mobility, and used a BSCommode as the bathroom is on the second floor of the apartment. Pt. Resides on the 1st floor, and does not go to the 2nd floor. Pt. with elevated troponin secondary to demand ischemia. BP 128/48, HR 75 during the session. Pt. education was provided about ADL functioning. Pt. Could benefit from OT services for ADL training, A/E training, and pt. Education about home modification, and DME. Pt. Could benefit from [facility] follow-up services upon discharge.    Follow Up Recommendations  Home health OT    Equipment Recommendations       Recommendations for Other Services       Precautions / Restrictions        Mobility              General transfer comment: deferred. Pt. Seen for bed level eval.    Balance                                           ADL either performed or assessed with clinical judgement   ADL Overall ADL's : Independent Eating/Feeding: Set up;Supervision/ safety   Grooming: Set up;Minimal assistance;Bed level   Upper Body Bathing: Set up;Moderate assistance;Bed level   Lower Body Bathing: Set up;Moderate assistance;Bed level   Upper Body Dressing : Set up;Moderate assistance;Bed level   Lower Body Dressing: Set up;Moderate assistance;Bed level                       Vision Baseline Vision/History: Wears glasses Wears Glasses: At all times Patient Visual Report: No change from baseline       Perception     Praxis      Pertinent Vitals/Pain Pain Assessment: No/denies pain     Hand Dominance Right   Extremity/Trunk Assessment Upper Extremity Assessment Upper Extremity Assessment: Generalized weakness(Limited RIght shoulder ROM for previous CVA.)           Communication Communication Communication: Expressive difficulties   Cognition Arousal/Alertness: Awake/alert Behavior During Therapy: WFL for tasks assessed/performed Overall Cognitive Status: No family/caregiver present to determine baseline cognitive functioning                                     General Comments       Exercises  Shoulder Instructions      Home Living Family/patient expects to be discharged to:: Private residence Living Arrangements: Children(Resides with daughter) Available Help at Discharge: Family;Available 24 hours/day Type of Home: Apartment Home Access: Level entry     Home Layout: Two level Alternate Level Stairs-Number of Steps: 14 steps w/ R handrail from 1st to 2nd floor where bathroom w/ tub/shower is Alternate Level Stairs-Rails: Right Bathroom Shower/Tub: Chief Strategy OfficerTub/shower unit   Bathroom Toilet: Standard     Home Equipment: Bedside commode;Wheelchair - manual   Additional Comments: No family present to confirm information provided by pt, pt is  poor historian      Prior Functioning/Environment Level of Independence: Needs assistance  Gait / Transfers Assistance Needed: Pt. uses a w/c, transfers to nd from the w/c. ADL's / Homemaking Assistance Needed: Pt.'s daughter assists pt. with morning ADLs, taking sponge baths, IADLs including: meal preparation, and medication management, and transportation.             OT Problem List: Decreased strength;Decreased activity tolerance;Impaired UE functional use;Decreased range of motion;Decreased safety awareness;Decreased knowledge of use of DME or AE      OT Treatment/Interventions: Self-care/ADL training;Therapeutic exercise;Patient/family education;DME and/or AE instruction;Therapeutic activities;Energy conservation;Neuromuscular education    OT Goals(Current goals can be found in the care plan section) Acute Rehab OT Goals Patient Stated Goal: To return home OT Goal Formulation: With patient Potential to Achieve Goals: Good  OT Frequency: Min 1X/week   Barriers to D/C:            Co-evaluation              AM-PAC PT "6 Clicks" Daily Activity     Outcome Measure Help from another person eating meals?: A Little Help from another person taking care of personal grooming?: A Little Help from another person toileting, which includes using toliet, bedpan, or urinal?: A Lot Help from another person bathing (including washing, rinsing, drying)?: A Lot Help from another person to put on and taking off regular upper body clothing?: A Lot Help from another person to put on and taking off regular lower body clothing?: A Lot 6 Click Score: 14   End of Session Equipment Utilized During Treatment: Gait belt  Activity Tolerance: Patient tolerated treatment well Patient left: in bed  OT Visit Diagnosis: Unsteadiness on feet (R26.81)                Time: 1610-9604: 0915-0938 OT Time Calculation (min): 23 min Charges:  OT General Charges $OT Visit: 1 Visit OT Evaluation $OT Eval  Moderate Complexity: 1 Mod G-Codes:     Olegario MessierElaine Christyl Osentoski, MS, OTR/L   Olegario MessierElaine Jakeira Seeman, MS,  OTR/L 12/14/2017, 11:30 AM

## 2017-12-14 NOTE — Progress Notes (Signed)
[employer] - [place] [facility]   PATIENT NAME: Destiny Mack    MR#:  [id number]  DATE OF BIRTH:  01-27-1946  SUBJECTIVE:  Patient seen and evaluated today No new episodes of syncope No chest pain No difficulty in vision  REVIEW OF SYSTEMS:    ROS  CONSTITUTIONAL: No documented fever. No fatigue, weakness. No weight gain, no weight loss.  EYES: No blurry or double vision.  ENT: No tinnitus. No postnasal drip. No redness of the oropharynx.  RESPIRATORY: No cough, no wheeze, no hemoptysis. No dyspnea.  CARDIOVASCULAR: No chest pain. No orthopnea. No palpitations. No syncope.  GASTROINTESTINAL: No nausea, no vomiting or diarrhea. No abdominal pain. No melena or hematochezia.  GENITOURINARY: No dysuria or hematuria.  ENDOCRINE: No polyuria or nocturia. No heat or cold intolerance.  HEMATOLOGY: No anemia. No bruising. No bleeding.  INTEGUMENTARY: No rashes. No lesions.  MUSCULOSKELETAL: No arthritis. No swelling. No gout.  NEUROLOGIC: No numbness, tingling, or ataxia. No seizure-type activity.  Right-sided weakness secondary to prior stroke PSYCHIATRIC: No anxiety. No insomnia. No ADD.   DRUG ALLERGIES:  No Known Allergies  VITALS:  Blood pressure (!) 122/53, pulse 72, temperature 98.1 F (36.7 C), temperature source Oral, resp. rate 14, height 5\' 4"  (1.626 m), weight 67.1 kg (148 lb), SpO2 99 %.  PHYSICAL EXAMINATION:   Physical Exam  GENERAL:  72 y.o.-year-old patient lying in the bed with no acute distress.  EYES: Pupils equal, round, reactive to light and accommodation. No scleral icterus. Extraocular muscles intact.  HEENT: Head atraumatic, normocephalic. Oropharynx and nasopharynx clear.  NECK:  Supple, no jugular venous distention. No thyroid enlargement, no tenderness.  LUNGS: Normal breath sounds bilaterally, no wheezing, rales, rhonchi. No use of accessory muscles of respiration.  CARDIOVASCULAR: S1, S2 normal. No murmurs, rubs, or gallops.   ABDOMEN: Soft, nontender, nondistended. Bowel sounds present. No organomegaly or mass.  EXTREMITIES: No cyanosis, clubbing or edema b/l.    NEUROLOGIC: Cranial nerves II through XII are intact. No focal Motor or sensory deficits b/l.   PSYCHIATRIC: The patient is alert and oriented x 3.  SKIN: No obvious rash, lesion, or ulcer.   LABORATORY PANEL:   CBC Recent Labs  Lab 12/14/17 0525  WBC 7.1  HGB 11.5*  HCT 34.6*  PLT 145*   ------------------------------------------------------------------------------------------------------------------ Chemistries  Recent Labs  Lab 12/14/17 0525  NA 140  K 3.6  CL 114*  CO2 21*  GLUCOSE 82  BUN 29*  CREATININE 1.38*  CALCIUM 7.9*   ------------------------------------------------------------------------------------------------------------------  Cardiac Enzymes Recent Labs  Lab 12/13/17 1349  TROPONINI 0.05*   ------------------------------------------------------------------------------------------------------------------  RADIOLOGY:  Ct Head Wo Contrast  Result Date: 12/13/2017 CLINICAL DATA:  Syncopal episode.  Decreased responsiveness. EXAM: CT HEAD WITHOUT CONTRAST TECHNIQUE: Contiguous axial images were obtained from the base of the skull through the vertex without intravenous contrast. COMPARISON:  MR head 10/29/2017.  CT head 10/29/2017. FINDINGS: Brain: No evidence for acute infarction, hemorrhage, mass lesion, hydrocephalus, or extra-axial fluid. Generalized atrophy. Extensive white matter hypoattenuation favored to represent small vessel disease. Vascular: Calcification of the cavernous internal carotid arteries consistent with cerebrovascular atherosclerotic disease. No signs of intracranial large vessel occlusion. Skull: Normal. Negative for fracture or focal lesion. Sinuses/Orbits: No acute finding. Other: Compared with prior CT and MR, the acute infarcts of the RIGHT internal and external capsule, and LEFT external  capsule, demonstrated previously are observed as areas of hypoattenuation on today's examination. IMPRESSION: Atrophy and small vessel disease. Sequelae of recent  lacunar infarcts, now subacute to chronic. No acute intracranial findings are evident. Electronically Signed   By: Elsie Stain M.D.   On: 12/13/2017 14:55   Mr Angiogram Head Wo Contrast  Result Date: 12/13/2017 CLINICAL DATA:  Syncopal episode today. EXAM: MRI HEAD WITHOUT CONTRAST MRA HEAD WITHOUT CONTRAST TECHNIQUE: Multiplanar, multiecho pulse sequences of the brain and surrounding structures were obtained without intravenous contrast. Angiographic images of the head were obtained using MRA technique without contrast. COMPARISON:  Head CT 12/13/2017.  Head MRI/MRA 10/29/2017. FINDINGS: MRI HEAD FINDINGS Brain: There is a 1.7 cm acute infarct in the posteromedial left occipital lobe. An acute curvilinear infarct is noted in the left parietal lobe extending from the periventricular to the subcortical white matter. There are also acute subcentimeter periventricular white matter infarcts in the right temporal lobe. A 2 mm focus of mild trace diffusion hyperintensity in the left corona radiata is new from the prior MRI but is without clearly reduced ADC to confirm an acute infarct though evaluation is limited by its small size. The small acute infarcts on the prior MRI are now largely late subacute in appearance with some early developing encephalomalacia. There is mild residual trace diffusion signal abnormality in the left centrum semiovale and anterior right external capsule corresponding to old infarcts which were present on the prior MRI. No intracranial hemorrhage, mass, midline shift, or extra-axial fluid collection is identified. Chronic lacunar infarcts are noted in the right corona radiata, left cerebellum, and left paramedian pons. Patchy T2 hyperintensities in the cerebral white matter and pons are nonspecific but compatible with  moderately extensive chronic small vessel ischemic disease, similar to the prior MRI. There is mild cerebral atrophy. Vascular: Major intracranial vascular flow voids are preserved. Skull and upper cervical spine: Unremarkable bone marrow signal. Sinuses/Orbits: Unremarkable orbits. Trace left mastoid fluid. Minimal paranasal sinus mucosal thickening. Other: None. MRA HEAD FINDINGS The study is moderately motion degraded. There is near complete signal loss in both V4 segments which may be artifactual given the symmetry as well as preserved flow related enhancement in both distal V3 segments, however reduced flow from underlying stenoses is not excluded. Signal loss extends to the vertebrobasilar junction, partially obscuring the proximal most basilar artery. The remainder of the basilar artery is widely patent. There is mild fullness of the basilar tip which is favored to reflect the confluence of the PCA and SCA origins, without a saccular aneurysm identified. Posterior communicating arteries are present bilaterally, right larger than left. The right P1 segment is likely very hypoplastic, and there is milder hypoplasia of the left P1 segment. The P2 segments are grossly patent, however motion limits detailed assessment. The distal right cervical ICA is tortuous. The intracranial ICAs are widely patent. M1 segments and MCA bifurcations are grossly patent, however motion artifact results in signal loss particularly notable in the right greater than left proximal M2 branches which limits assessment. The ACAs are patent with motion artifact through the A1 and proximal A2 segments limiting evaluation for stenosis. No large aneurysm is identified. IMPRESSION: 1. Small acute infarcts in the left occipital lobe and white matter of the left parietal and right temporal lobes. 2. Multiple late subacute and chronic infarcts as above. 3. Extensive chronic small vessel ischemic disease. 4. Motion degraded head MRA without large  vessel occlusion in the anterior circulation. Signal loss throughout both V4 segments through the vertebrobasilar junction is favored to be artifactual but results in nondiagnostic assessment of these vessels. Electronically Signed   By:  Sebastian Ache M.D.   On: 12/13/2017 17:26   Mr Brain Wo Contrast  Result Date: 12/13/2017 CLINICAL DATA:  Syncopal episode today. EXAM: MRI HEAD WITHOUT CONTRAST MRA HEAD WITHOUT CONTRAST TECHNIQUE: Multiplanar, multiecho pulse sequences of the brain and surrounding structures were obtained without intravenous contrast. Angiographic images of the head were obtained using MRA technique without contrast. COMPARISON:  Head CT 12/13/2017.  Head MRI/MRA 10/29/2017. FINDINGS: MRI HEAD FINDINGS Brain: There is a 1.7 cm acute infarct in the posteromedial left occipital lobe. An acute curvilinear infarct is noted in the left parietal lobe extending from the periventricular to the subcortical white matter. There are also acute subcentimeter periventricular white matter infarcts in the right temporal lobe. A 2 mm focus of mild trace diffusion hyperintensity in the left corona radiata is new from the prior MRI but is without clearly reduced ADC to confirm an acute infarct though evaluation is limited by its small size. The small acute infarcts on the prior MRI are now largely late subacute in appearance with some early developing encephalomalacia. There is mild residual trace diffusion signal abnormality in the left centrum semiovale and anterior right external capsule corresponding to old infarcts which were present on the prior MRI. No intracranial hemorrhage, mass, midline shift, or extra-axial fluid collection is identified. Chronic lacunar infarcts are noted in the right corona radiata, left cerebellum, and left paramedian pons. Patchy T2 hyperintensities in the cerebral white matter and pons are nonspecific but compatible with moderately extensive chronic small vessel ischemic disease,  similar to the prior MRI. There is mild cerebral atrophy. Vascular: Major intracranial vascular flow voids are preserved. Skull and upper cervical spine: Unremarkable bone marrow signal. Sinuses/Orbits: Unremarkable orbits. Trace left mastoid fluid. Minimal paranasal sinus mucosal thickening. Other: None. MRA HEAD FINDINGS The study is moderately motion degraded. There is near complete signal loss in both V4 segments which may be artifactual given the symmetry as well as preserved flow related enhancement in both distal V3 segments, however reduced flow from underlying stenoses is not excluded. Signal loss extends to the vertebrobasilar junction, partially obscuring the proximal most basilar artery. The remainder of the basilar artery is widely patent. There is mild fullness of the basilar tip which is favored to reflect the confluence of the PCA and SCA origins, without a saccular aneurysm identified. Posterior communicating arteries are present bilaterally, right larger than left. The right P1 segment is likely very hypoplastic, and there is milder hypoplasia of the left P1 segment. The P2 segments are grossly patent, however motion limits detailed assessment. The distal right cervical ICA is tortuous. The intracranial ICAs are widely patent. M1 segments and MCA bifurcations are grossly patent, however motion artifact results in signal loss particularly notable in the right greater than left proximal M2 branches which limits assessment. The ACAs are patent with motion artifact through the A1 and proximal A2 segments limiting evaluation for stenosis. No large aneurysm is identified. IMPRESSION: 1. Small acute infarcts in the left occipital lobe and white matter of the left parietal and right temporal lobes. 2. Multiple late subacute and chronic infarcts as above. 3. Extensive chronic small vessel ischemic disease. 4. Motion degraded head MRA without large vessel occlusion in the anterior circulation. Signal loss  throughout both V4 segments through the vertebrobasilar junction is favored to be artifactual but results in nondiagnostic assessment of these vessels. Electronically Signed   By: Sebastian Ache M.D.   On: 12/13/2017 17:26     ASSESSMENT AND PLAN:  72 year old  elderly female patient with history of CVA with right-sided weakness, A. fib, hypertension, urinary incontinence currently under service for hypotension and near syncope.  1.  Acute kidney injury improved IV fluid hydration finished  2.  Acute small infarcts in the left occipital lobe probably secondary to embolic etiology Status post neurology consultation Start patient on anticoagulation with oral Eliquis  3.  History of CVA  4.  Elevated troponin secondary to demand ischemia  5.  Hypokalemia resolved  6.  DVT prophylaxis On Eliquis  All the records are reviewed and case discussed with Care Management/Social Worker. Management plans discussed with the patient, family and they are in agreement.  CODE STATUS: Full code  DVT Prophylaxis: SCDs  TOTAL TIME TAKING CARE OF THIS PATIENT: 36 minutes.   POSSIBLE D/C IN 1 to 2  DAYS, DEPENDING ON CLINICAL CONDITION.  Ihor Austin M.D on 12/14/2017 at 3:28 PM  Between 7am to 6pm - Pager - [phone]  After 6pm go to [email or web address] - password [email or web address] [facility]  [facility] [place] [facility]  Office  [phone]  CC: Primary care physician; Oswaldo Conroy, MD  Note: This dictation was prepared with Dragon dictation along with smaller phrase technology. Any transcriptional errors that result from this process are unintentional.

## 2017-12-15 LAB — URINE CULTURE: CULTURE: NO GROWTH

## 2017-12-15 MED ORDER — ASPIRIN 325 MG PO TABS: 325.0000 mg | ORAL_TABLET | Freq: Every day | ORAL | 1 refills | Status: DC

## 2017-12-15 MED ORDER — APIXABAN 5 MG PO TABS: 5.0000 mg | ORAL_TABLET | Freq: Two times a day (BID) | ORAL | 1 refills | Status: DC

## 2017-12-15 MED ORDER — CIPROFLOXACIN HCL 250 MG PO TABS: 250.0000 mg | ORAL_TABLET | Freq: Two times a day (BID) | ORAL | 0 refills | Status: AC

## 2017-12-15 NOTE — Plan of Care (Signed)
Pt d/ced home. Was admitted for acute stroke and has hx of stroke.  Pt admitted recently and was d/ced to [facility]. After d/c went to daughter's house.  Pt has more confusion today than yesterday.  Thought her daughter was in the room across the hall and was made that she was coming to see her.  Had daughter call into mother's room and talk to her.  Pt is more irritable today and don't know if emotional lability r/t stroke.  A&O x2.  Pt refused to work with occupational therapy.  Pt will be on cipro 3 days for UTI and [facility]  Removed IVs.  Will call d/c instructions to daughter.  She doesn't have any transportation and pt will transport home w/EMS.

## 2017-12-15 NOTE — Care Management (Signed)
Discharge to home today per Dr. Carole BinningPreddy. Followed by [facility]. [place], [facility] representative updated, No transportation home. Not safe to go by Taxi. Will arrange [place] [facility] Gwenette GreetBrenda S Jobe Mutch RN MSN CCM Care Management [phone]

## 2017-12-15 NOTE — Progress Notes (Signed)
OT Cancellation Note  Patient Details Name: Destiny BeringLinda M Mack MRN: [id number] DOB: Jan 20, 1946   Cancelled Treatment:    Reason Eval/Treat Not Completed: Patient declined, no reason specified. Pt initially receptive to OT but received phone call from daughter and became agitated on the phone. After phone call pt requesting a cold drink and declining OT treatment. Pt provided with preferred drink. Will re-attempt treatment at later date as pt is available and willing to participate.   Richrd PrimeJamie Stiller, MPH, MS, OTR/L ascom [phone] 12/15/17, 12:22 PM

## 2017-12-15 NOTE — Discharge Summary (Addendum)
[employer] - [place] [facility]   PATIENT NAME: Destiny Mack    MR#:  [id number]  DATE OF BIRTH:  Dec 27, 1945  DATE OF ADMISSION:  12/13/2017 ADMITTING PHYSICIAN: Alford Highland, MD  DATE OF DISCHARGE: No discharge date for patient encounter.  PRIMARY CARE PHYSICIAN: Oswaldo Conroy, MD   ADMISSION DIAGNOSIS:  1.  Acute kidney injury 2.  Hypotension 3.  Nausea and vomiting 4.  History of CVA 5.  Elevated troponin secondary to demand ischemia 6. Near syncope  DISCHARGE DIAGNOSIS:  1.  Acute kidney injury resolved 2.  Hypotension improved 3.  Nausea and vomiting resolved 4.  New occipital CVA 5.  Chronic atrial fibrillation  SECONDARY DIAGNOSIS:   Past Medical History:  Diagnosis Date  . A-fib (HCC)   . Depression   . Frequent falls   . Hypertension   . Leaking of urine   . Mixed incontinence   . Stroke (HCC)   . Tremor of hands and face      ADMITTING HISTORY Destiny Mack  is a 72 y.o. female with a known history of recent Campylobacter gastroenteritis.  She states that yesterday she was vomiting a lot.  She states her family sent her in for being unresponsive.  In the ER, she was hypotensive  with a blood pressure of 80/39.  Hospitalist services were contacted for further evaluation.  She states that she feels okay and she offers no complaints.  She states her diarrhea was 2 weeks ago.  She is not having any abdominal pain.  But she did vomit a lot yesterday.  She was also found to have acute kidney injury.  HOSPITAL COURSE:  Patient was admitted to medical floor, received IV fluid hydration.  Her hypotension resolved.  Her renal insufficiency also resolved.  Patient's elevated troponin was secondary to demand ischemia.  Her potassium was replaced.  She was worked up initially for near syncope with CT head and MRI brain. Small acute infarcts in the left occipital lobe and white matterof the left parietal and right temporal lobes were noted in the  MRI brain.  Neurology consultation was done who recommended to start patient on oral Eliquis for anticoagulation as the new stroke is embolic in etiology.  Patient started on oral Eliquis for anticoagulation.patient also had some dysuria and had some cystitis .will be discharged home on oral ciprofloxacin for 3 days .Will be discharged to home and follow-up with primary care physician in the clinic and neurology in the outpatient clinic.  Patient will be discharged home with home physical therapy.  CONSULTS OBTAINED:  Treatment Team:  Thana Farr, MD  DRUG ALLERGIES:  No Known Allergies  DISCHARGE MEDICATIONS:   Allergies as of 12/15/2017   No Known Allergies     Medication List    TAKE these medications   amLODipine 5 MG tablet Commonly known as:  NORVASC Take 5 mg by mouth daily.   apixaban 5 MG Tabs tablet Commonly known as:  ELIQUIS Take 1 tablet (5 mg total) by mouth 2 (two) times daily.   atorvastatin 20 MG tablet Commonly known as:  LIPITOR Take 20 mg by mouth daily.   ciprofloxacin 250 MG tablet Commonly known as:  CIPRO Take 1 tablet (250 mg total) by mouth 2 (two) times daily for 3 days.   lisinopril 30 MG tablet Commonly known as:  PRINIVIL,ZESTRIL Take 30 mg by mouth daily.     Aspirin 325 mg orally once daily  Today  Patient seen  and evaluated today No blurry vision No chest pain No shortness of breath  VITAL SIGNS:  Blood pressure (!) 153/61, pulse 73, temperature 98.6 F (37 C), temperature source Oral, resp. rate 18, height 5\' 4"  (1.626 m), weight 67.1 kg (148 lb), SpO2 98 %.  I/O:    Intake/Output Summary (Last 24 hours) at 12/15/2017 1449 Last data filed at 12/15/2017 1133 Gross per 24 hour  Intake 3207.67 ml  Output 450 ml  Net 2757.67 ml    PHYSICAL EXAMINATION:  Physical Exam  GENERAL:  72 y.o.-year-old patient lying in the bed with no acute distress.  LUNGS: Normal breath sounds bilaterally, no wheezing, rales,rhonchi or  crepitation. No use of accessory muscles of respiration.  CARDIOVASCULAR: S1, S2 normal. No murmurs, rubs, or gallops.  ABDOMEN: Soft, non-tender, non-distended. Bowel sounds present. No organomegaly or mass.  NEUROLOGIC: Moves all 4 extremities. PSYCHIATRIC: The patient is alert and oriented x 3.  SKIN: No obvious rash, lesion, or ulcer.   DATA REVIEW:   CBC Recent Labs  Lab 12/14/17 0525  WBC 7.1  HGB 11.5*  HCT 34.6*  PLT 145*    Chemistries  Recent Labs  Lab 12/14/17 0525 12/14/17 0530  NA 140  --   K 3.6  --   CL 114*  --   CO2 21*  --   GLUCOSE 82  --   BUN 29*  --   CREATININE 1.38*  --   CALCIUM 7.9*  --   MG  --  2.5*    Cardiac Enzymes Recent Labs  Lab 12/13/17 1349  TROPONINI 0.05*    Microbiology Results  Results for orders placed or performed during the hospital encounter of 12/13/17  Urine Culture     Status: None   Collection Time: 12/14/17 10:57 AM  Result Value Ref Range Status   Specimen Description   Final    URINE, RANDOM Performed at [facility], [address] [place], [place] [address]    Special Requests   Final    NONE Performed at [facility], [address] [place], [place] [address]    Culture   Final    NO GROWTH Performed at [facility], [address] [place], [place] [address]    Report Status 12/15/2017 FINAL  Final    RADIOLOGY:  Ct Head Wo Contrast  Result Date: 12/13/2017 CLINICAL DATA:  Syncopal episode.  Decreased responsiveness. EXAM: CT HEAD WITHOUT CONTRAST TECHNIQUE: Contiguous axial images were obtained from the base of the skull through the vertex without intravenous contrast. COMPARISON:  MR head 10/29/2017.  CT head 10/29/2017. FINDINGS: Brain: No evidence for acute infarction, hemorrhage, mass lesion, hydrocephalus, or extra-axial fluid. Generalized atrophy. Extensive white matter hypoattenuation favored to represent small vessel disease. Vascular: Calcification of the  cavernous internal carotid arteries consistent with cerebrovascular atherosclerotic disease. No signs of intracranial large vessel occlusion. Skull: Normal. Negative for fracture or focal lesion. Sinuses/Orbits: No acute finding. Other: Compared with prior CT and MR, the acute infarcts of the RIGHT internal and external capsule, and LEFT external capsule, demonstrated previously are observed as areas of hypoattenuation on today's examination. IMPRESSION: Atrophy and small vessel disease. Sequelae of recent lacunar infarcts, now subacute to chronic. No acute intracranial findings are evident. Electronically Signed   By: Elsie StainJohn T Curnes M.D.   On: 12/13/2017 14:55   Mr Angiogram Head Wo Contrast  Result Date: 12/13/2017 CLINICAL DATA:  Syncopal episode today. EXAM: MRI HEAD WITHOUT CONTRAST MRA HEAD WITHOUT  CONTRAST TECHNIQUE: Multiplanar, multiecho pulse sequences of the brain and surrounding structures were obtained without intravenous contrast. Angiographic images of the head were obtained using MRA technique without contrast. COMPARISON:  Head CT 12/13/2017.  Head MRI/MRA 10/29/2017. FINDINGS: MRI HEAD FINDINGS Brain: There is a 1.7 cm acute infarct in the posteromedial left occipital lobe. An acute curvilinear infarct is noted in the left parietal lobe extending from the periventricular to the subcortical white matter. There are also acute subcentimeter periventricular white matter infarcts in the right temporal lobe. A 2 mm focus of mild trace diffusion hyperintensity in the left corona radiata is new from the prior MRI but is without clearly reduced ADC to confirm an acute infarct though evaluation is limited by its small size. The small acute infarcts on the prior MRI are now largely late subacute in appearance with some early developing encephalomalacia. There is mild residual trace diffusion signal abnormality in the left centrum semiovale and anterior right external capsule corresponding to old infarcts  which were present on the prior MRI. No intracranial hemorrhage, mass, midline shift, or extra-axial fluid collection is identified. Chronic lacunar infarcts are noted in the right corona radiata, left cerebellum, and left paramedian pons. Patchy T2 hyperintensities in the cerebral white matter and pons are nonspecific but compatible with moderately extensive chronic small vessel ischemic disease, similar to the prior MRI. There is mild cerebral atrophy. Vascular: Major intracranial vascular flow voids are preserved. Skull and upper cervical spine: Unremarkable bone marrow signal. Sinuses/Orbits: Unremarkable orbits. Trace left mastoid fluid. Minimal paranasal sinus mucosal thickening. Other: None. MRA HEAD FINDINGS The study is moderately motion degraded. There is near complete signal loss in both V4 segments which may be artifactual given the symmetry as well as preserved flow related enhancement in both distal V3 segments, however reduced flow from underlying stenoses is not excluded. Signal loss extends to the vertebrobasilar junction, partially obscuring the proximal most basilar artery. The remainder of the basilar artery is widely patent. There is mild fullness of the basilar tip which is favored to reflect the confluence of the PCA and SCA origins, without a saccular aneurysm identified. Posterior communicating arteries are present bilaterally, right larger than left. The right P1 segment is likely very hypoplastic, and there is milder hypoplasia of the left P1 segment. The P2 segments are grossly patent, however motion limits detailed assessment. The distal right cervical ICA is tortuous. The intracranial ICAs are widely patent. M1 segments and MCA bifurcations are grossly patent, however motion artifact results in signal loss particularly notable in the right greater than left proximal M2 branches which limits assessment. The ACAs are patent with motion artifact through the A1 and proximal A2 segments  limiting evaluation for stenosis. No large aneurysm is identified. IMPRESSION: 1. Small acute infarcts in the left occipital lobe and white matter of the left parietal and right temporal lobes. 2. Multiple late subacute and chronic infarcts as above. 3. Extensive chronic small vessel ischemic disease. 4. Motion degraded head MRA without large vessel occlusion in the anterior circulation. Signal loss throughout both V4 segments through the vertebrobasilar junction is favored to be artifactual but results in nondiagnostic assessment of these vessels. Electronically Signed   By: Sebastian Ache M.D.   On: 12/13/2017 17:26   Mr Brain Wo Contrast  Result Date: 12/13/2017 CLINICAL DATA:  Syncopal episode today. EXAM: MRI HEAD WITHOUT CONTRAST MRA HEAD WITHOUT CONTRAST TECHNIQUE: Multiplanar, multiecho pulse sequences of the brain and surrounding structures were obtained without intravenous contrast. Angiographic  images of the head were obtained using MRA technique without contrast. COMPARISON:  Head CT 12/13/2017.  Head MRI/MRA 10/29/2017. FINDINGS: MRI HEAD FINDINGS Brain: There is a 1.7 cm acute infarct in the posteromedial left occipital lobe. An acute curvilinear infarct is noted in the left parietal lobe extending from the periventricular to the subcortical white matter. There are also acute subcentimeter periventricular white matter infarcts in the right temporal lobe. A 2 mm focus of mild trace diffusion hyperintensity in the left corona radiata is new from the prior MRI but is without clearly reduced ADC to confirm an acute infarct though evaluation is limited by its small size. The small acute infarcts on the prior MRI are now largely late subacute in appearance with some early developing encephalomalacia. There is mild residual trace diffusion signal abnormality in the left centrum semiovale and anterior right external capsule corresponding to old infarcts which were present on the prior MRI. No intracranial  hemorrhage, mass, midline shift, or extra-axial fluid collection is identified. Chronic lacunar infarcts are noted in the right corona radiata, left cerebellum, and left paramedian pons. Patchy T2 hyperintensities in the cerebral white matter and pons are nonspecific but compatible with moderately extensive chronic small vessel ischemic disease, similar to the prior MRI. There is mild cerebral atrophy. Vascular: Major intracranial vascular flow voids are preserved. Skull and upper cervical spine: Unremarkable bone marrow signal. Sinuses/Orbits: Unremarkable orbits. Trace left mastoid fluid. Minimal paranasal sinus mucosal thickening. Other: None. MRA HEAD FINDINGS The study is moderately motion degraded. There is near complete signal loss in both V4 segments which may be artifactual given the symmetry as well as preserved flow related enhancement in both distal V3 segments, however reduced flow from underlying stenoses is not excluded. Signal loss extends to the vertebrobasilar junction, partially obscuring the proximal most basilar artery. The remainder of the basilar artery is widely patent. There is mild fullness of the basilar tip which is favored to reflect the confluence of the PCA and SCA origins, without a saccular aneurysm identified. Posterior communicating arteries are present bilaterally, right larger than left. The right P1 segment is likely very hypoplastic, and there is milder hypoplasia of the left P1 segment. The P2 segments are grossly patent, however motion limits detailed assessment. The distal right cervical ICA is tortuous. The intracranial ICAs are widely patent. M1 segments and MCA bifurcations are grossly patent, however motion artifact results in signal loss particularly notable in the right greater than left proximal M2 branches which limits assessment. The ACAs are patent with motion artifact through the A1 and proximal A2 segments limiting evaluation for stenosis. No large aneurysm is  identified. IMPRESSION: 1. Small acute infarcts in the left occipital lobe and white matter of the left parietal and right temporal lobes. 2. Multiple late subacute and chronic infarcts as above. 3. Extensive chronic small vessel ischemic disease. 4. Motion degraded head MRA without large vessel occlusion in the anterior circulation. Signal loss throughout both V4 segments through the vertebrobasilar junction is favored to be artifactual but results in nondiagnostic assessment of these vessels. Electronically Signed   By: Sebastian Ache M.D.   On: 12/13/2017 17:26    Follow up with PCP in 1 week.  Management plans discussed with the patient, family and they are in agreement.  CODE STATUS: Full code    Code Status Orders  (From admission, onward)        Start     Ordered   12/13/17 1630  Full code  Continuous  12/13/17 1630    Code Status History    Date Active Date Inactive Code Status Order ID Comments User Context   11/22/2017 0202 11/23/2017 2009 DNR [id number]  Bertrum Sol, MD ED   11/22/2017 0150 11/22/2017 0202 Full Code [id number]  Salary, Evelena Asa, MD ED   11/01/2017 1026 11/02/2017 0155 DNR [id number]  Delfino Lovett, MD Inpatient   10/29/2017 1256 11/01/2017 1026 Full Code [id number]  Alford Highland, MD ED      TOTAL TIME TAKING CARE OF THIS PATIENT ON DAY OF DISCHARGE: more than 33 minutes.   Ihor Austin M.D on 12/15/2017 at 2:49 PM  Between 7am to 6pm - Pager - [phone]  After 6pm go to [email or web address] - password [email or web address] [facility]  [employer]  [facility]  Office  [phone]  CC: Primary care physician; Oswaldo Conroy, MD  Note: This dictation was prepared with Dragon dictation along with smaller phrase technology. Any transcriptional errors that result from this process are unintentional.

## 2017-12-31 ENCOUNTER — Encounter: Payer: Self-pay | Admitting: Emergency Medicine

## 2017-12-31 ENCOUNTER — Inpatient Hospital Stay
Admission: EM | Admit: 2017-12-31 | Discharge: 2018-01-04 | DRG: 871 | Disposition: A | Discharge status: Home Health | Payer: Medicare HMO | Attending: Specialist | Admitting: Specialist

## 2017-12-31 ENCOUNTER — Emergency Department: Payer: Medicare HMO

## 2017-12-31 ENCOUNTER — Other Ambulatory Visit: Payer: Self-pay

## 2017-12-31 DIAGNOSIS — N179 Acute kidney failure, unspecified: Secondary | ICD-10-CM | POA: Diagnosis present

## 2017-12-31 DIAGNOSIS — I480 Paroxysmal atrial fibrillation: Secondary | ICD-10-CM | POA: Diagnosis present

## 2017-12-31 DIAGNOSIS — Z79899 Other long term (current) drug therapy: Secondary | ICD-10-CM

## 2017-12-31 DIAGNOSIS — F17210 Nicotine dependence, cigarettes, uncomplicated: Secondary | ICD-10-CM | POA: Diagnosis present

## 2017-12-31 DIAGNOSIS — I129 Hypertensive chronic kidney disease with stage 1 through stage 4 chronic kidney disease, or unspecified chronic kidney disease: Secondary | ICD-10-CM | POA: Diagnosis present

## 2017-12-31 DIAGNOSIS — Z66 Do not resuscitate: Secondary | ICD-10-CM | POA: Diagnosis present

## 2017-12-31 DIAGNOSIS — Z7982 Long term (current) use of aspirin: Secondary | ICD-10-CM | POA: Diagnosis not present

## 2017-12-31 DIAGNOSIS — G9341 Metabolic encephalopathy: Secondary | ICD-10-CM | POA: Diagnosis present

## 2017-12-31 DIAGNOSIS — R296 Repeated falls: Secondary | ICD-10-CM | POA: Diagnosis present

## 2017-12-31 DIAGNOSIS — N183 Chronic kidney disease, stage 3 (moderate): Secondary | ICD-10-CM | POA: Diagnosis present

## 2017-12-31 DIAGNOSIS — Z8673 Personal history of transient ischemic attack (TIA), and cerebral infarction without residual deficits: Secondary | ICD-10-CM | POA: Diagnosis not present

## 2017-12-31 DIAGNOSIS — R0902 Hypoxemia: Secondary | ICD-10-CM | POA: Diagnosis present

## 2017-12-31 DIAGNOSIS — E782 Mixed hyperlipidemia: Secondary | ICD-10-CM | POA: Diagnosis present

## 2017-12-31 DIAGNOSIS — R410 Disorientation, unspecified: Secondary | ICD-10-CM | POA: Diagnosis present

## 2017-12-31 DIAGNOSIS — Y95 Nosocomial condition: Secondary | ICD-10-CM | POA: Diagnosis present

## 2017-12-31 DIAGNOSIS — A4101 Sepsis due to Methicillin susceptible Staphylococcus aureus: Secondary | ICD-10-CM | POA: Diagnosis present

## 2017-12-31 DIAGNOSIS — J15211 Pneumonia due to Methicillin susceptible Staphylococcus aureus: Secondary | ICD-10-CM | POA: Diagnosis present

## 2017-12-31 DIAGNOSIS — A419 Sepsis, unspecified organism: Secondary | ICD-10-CM

## 2017-12-31 DIAGNOSIS — Z87440 Personal history of urinary (tract) infections: Secondary | ICD-10-CM

## 2017-12-31 DIAGNOSIS — E440 Moderate protein-calorie malnutrition: Secondary | ICD-10-CM | POA: Diagnosis present

## 2017-12-31 DIAGNOSIS — Z7901 Long term (current) use of anticoagulants: Secondary | ICD-10-CM | POA: Diagnosis not present

## 2017-12-31 DIAGNOSIS — J189 Pneumonia, unspecified organism: Secondary | ICD-10-CM | POA: Diagnosis present

## 2017-12-31 DIAGNOSIS — I340 Nonrheumatic mitral (valve) insufficiency: Secondary | ICD-10-CM | POA: Diagnosis not present

## 2017-12-31 LAB — URINALYSIS, ROUTINE W REFLEX MICROSCOPIC
Bilirubin Urine: NEGATIVE
GLUCOSE, UA: NEGATIVE mg/dL
Ketones, ur: NEGATIVE mg/dL
NITRITE: NEGATIVE
PH: 5 (ref 5.0–8.0)
Protein, ur: 100 mg/dL — AB
RBC / HPF: >50 RBC/hpf — ABNORMAL HIGH (ref 0–5)
SPECIFIC GRAVITY, URINE: 1.019 (ref 1.005–1.030)
WBC, UA: >50 WBC/hpf — ABNORMAL HIGH (ref 0–5)

## 2017-12-31 LAB — COMPREHENSIVE METABOLIC PANEL
ALBUMIN: 3.1 g/dL — AB (ref 3.5–5.0)
ALT: 14 U/L (ref 14–54)
ANION GAP: 11 (ref 5–15)
AST: 28 U/L (ref 15–41)
Alkaline Phosphatase: 176 U/L — ABNORMAL HIGH (ref 38–126)
BUN: 23 mg/dL — AB (ref 6–20)
CHLORIDE: 104 mmol/L (ref 101–111)
CO2: 20 mmol/L — ABNORMAL LOW (ref 22–32)
Calcium: 8.2 mg/dL — ABNORMAL LOW (ref 8.9–10.3)
Creatinine, Ser: 1.9 mg/dL — ABNORMAL HIGH (ref 0.44–1.00)
GFR calc Af Amer: 29 mL/min — ABNORMAL LOW (ref >60)
GFR calc non Af Amer: 25 mL/min — ABNORMAL LOW (ref >60)
GLUCOSE: 113 mg/dL — AB (ref 65–99)
Potassium: 3.9 mmol/L (ref 3.5–5.1)
Sodium: 135 mmol/L (ref 135–145)
Total Bilirubin: 1 mg/dL (ref 0.3–1.2)
Total Protein: 7.2 g/dL (ref 6.5–8.1)

## 2017-12-31 LAB — PROTIME-INR
INR: 1.3
PROTHROMBIN TIME: 16.1 s — AB (ref 11.4–15.2)

## 2017-12-31 LAB — CBC WITH DIFFERENTIAL/PLATELET
BASOS PCT: 1 %
Basophils Absolute: 0.1 10*3/uL (ref 0–0.1)
Eosinophils Absolute: 0.1 10*3/uL (ref 0–0.7)
Eosinophils Relative: 1 %
HEMATOCRIT: 36 % (ref 35.0–47.0)
HEMOGLOBIN: 12 g/dL (ref 12.0–16.0)
LYMPHS ABS: 0.6 10*3/uL — AB (ref 1.0–3.6)
Lymphocytes Relative: 6 %
MCH: 30.1 pg (ref 26.0–34.0)
MCHC: 33.4 g/dL (ref 32.0–36.0)
MCV: 90.1 fL (ref 80.0–100.0)
MONOS PCT: 8 %
Monocytes Absolute: 0.9 10*3/uL (ref 0.2–0.9)
NEUTROS ABS: 10 10*3/uL — AB (ref 1.4–6.5)
NEUTROS PCT: 86 %
Platelets: 216 10*3/uL (ref 150–440)
RBC: 4 MIL/uL (ref 3.80–5.20)
RDW: 16.9 % — ABNORMAL HIGH (ref 11.5–14.5)
WBC: 11.6 10*3/uL — AB (ref 3.6–11.0)

## 2017-12-31 LAB — TROPONIN I: Troponin I: 0.03 ng/mL (ref <0.03)

## 2017-12-31 LAB — PROCALCITONIN
Procalcitonin: 0.14 ng/mL
Procalcitonin: 0.12 ng/mL

## 2017-12-31 LAB — LACTIC ACID, PLASMA
LACTIC ACID, VENOUS: 0.7 mmol/L (ref 0.5–1.9)
Lactic Acid, Venous: 1.1 mmol/L (ref 0.5–1.9)

## 2017-12-31 LAB — MRSA PCR SCREENING: MRSA by PCR: NEGATIVE

## 2017-12-31 LAB — LIPASE, BLOOD: LIPASE: 17 U/L (ref 11–51)

## 2017-12-31 MED ORDER — ONDANSETRON HCL 4 MG PO TABS: 4.0000 mg | ORAL_TABLET | Freq: Four times a day (QID) | ORAL | Status: DC | PRN

## 2017-12-31 MED ORDER — SODIUM CHLORIDE 0.9 % IV SOLN
1.0000 g | Freq: Once | INTRAVENOUS | Status: AC
  Administered 2017-12-31: 1 g via INTRAVENOUS
  Filled 2017-12-31: qty 10

## 2017-12-31 MED ORDER — ATORVASTATIN CALCIUM 20 MG PO TABS
20.0000 mg | ORAL_TABLET | Freq: Every day | ORAL | Status: DC
Start: 2017-12-31 — End: 2018-01-04
  Administered 2018-01-01 – 2018-01-03 (×3): 20 mg via ORAL
  Filled 2017-12-31 (×3): qty 1

## 2017-12-31 MED ORDER — VANCOMYCIN HCL IN DEXTROSE 750-5 MG/150ML-% IV SOLN
750.0000 mg | INTRAVENOUS | Status: DC
  Filled 2017-12-31 (×2): qty 150

## 2017-12-31 MED ORDER — ONDANSETRON HCL 4 MG/2ML IJ SOLN
4.0000 mg | Freq: Four times a day (QID) | INTRAMUSCULAR | Status: DC | PRN
  Administered 2018-01-04: 4 mg via INTRAVENOUS

## 2017-12-31 MED ORDER — ACETAMINOPHEN 650 MG RE SUPP: 650.0000 mg | Freq: Four times a day (QID) | RECTAL | Status: DC | PRN

## 2017-12-31 MED ORDER — METHYLPREDNISOLONE SODIUM SUCC 125 MG IJ SOLR
60.0000 mg | INTRAMUSCULAR | Status: DC
  Administered 2017-12-31 – 2018-01-01 (×2): 60 mg via INTRAVENOUS
  Filled 2017-12-31 (×2): qty 2

## 2017-12-31 MED ORDER — VANCOMYCIN HCL IN DEXTROSE 1-5 GM/200ML-% IV SOLN
1000.0000 mg | Freq: Once | INTRAVENOUS | Status: AC
Start: 2017-12-31 — End: 2017-12-31
  Administered 2017-12-31: 19:00:00 1000 mg via INTRAVENOUS
  Filled 2017-12-31: qty 200

## 2017-12-31 MED ORDER — ACETAMINOPHEN 500 MG PO TABS
1000.0000 mg | ORAL_TABLET | Freq: Once | ORAL | Status: AC
  Administered 2017-12-31: 1000 mg via ORAL
  Filled 2017-12-31: qty 2

## 2017-12-31 MED ORDER — IPRATROPIUM-ALBUTEROL 0.5-2.5 (3) MG/3ML IN SOLN
3.0000 mL | Freq: Four times a day (QID) | RESPIRATORY_TRACT | Status: DC
  Administered 2017-12-31 – 2018-01-01 (×4): 3 mL via RESPIRATORY_TRACT
  Filled 2017-12-31 (×4): qty 3

## 2017-12-31 MED ORDER — ACETAMINOPHEN 325 MG PO TABS: 650.0000 mg | ORAL_TABLET | Freq: Four times a day (QID) | ORAL | Status: DC | PRN

## 2017-12-31 MED ORDER — SODIUM CHLORIDE 0.9 % IV SOLN: 1.0000 g | Freq: Once | INTRAVENOUS | Status: DC

## 2017-12-31 MED ORDER — LISINOPRIL 10 MG PO TABS: 30.0000 mg | ORAL_TABLET | Freq: Every day | ORAL | Status: DC

## 2017-12-31 MED ORDER — ENOXAPARIN SODIUM 40 MG/0.4ML ~~LOC~~ SOLN: 40.0000 mg | SUBCUTANEOUS | Status: DC

## 2017-12-31 MED ORDER — APIXABAN 5 MG PO TABS
5.0000 mg | ORAL_TABLET | Freq: Two times a day (BID) | ORAL | Status: DC
  Administered 2017-12-31 – 2018-01-03 (×7): 5 mg via ORAL
  Filled 2017-12-31 (×7): qty 1

## 2017-12-31 MED ORDER — POLYETHYLENE GLYCOL 3350 17 G PO PACK: 17.0000 g | PACK | Freq: Every day | ORAL | Status: DC | PRN

## 2017-12-31 MED ORDER — SODIUM CHLORIDE 0.9 % IV SOLN
INTRAVENOUS | Status: DC
  Administered 2017-12-31 – 2018-01-01 (×2): via INTRAVENOUS

## 2017-12-31 MED ORDER — SODIUM CHLORIDE 0.9 % IV SOLN
2.0000 g | Freq: Every day | INTRAVENOUS | Status: DC
  Administered 2017-12-31: 19:00:00 2 g via INTRAVENOUS
  Filled 2017-12-31 (×2): qty 2

## 2017-12-31 MED ORDER — AMLODIPINE BESYLATE 5 MG PO TABS: 5.0000 mg | ORAL_TABLET | Freq: Every day | ORAL | Status: DC

## 2017-12-31 MED ORDER — ALBUTEROL SULFATE (2.5 MG/3ML) 0.083% IN NEBU: 2.5000 mg | INHALATION_SOLUTION | RESPIRATORY_TRACT | Status: DC | PRN

## 2017-12-31 MED ORDER — SODIUM CHLORIDE 0.9 % IV BOLUS
1000.0000 mL | Freq: Once | INTRAVENOUS | Status: AC
  Administered 2017-12-31: 1000 mL via INTRAVENOUS

## 2017-12-31 NOTE — ED Provider Notes (Signed)
[facility] Emergency Department Provider Note  ____________________________________________   First MD Initiated Contact with Patient 12/31/17 1345     (approximate)  I have reviewed the triage vital signs and the nursing notes.   HISTORY  Chief Complaint Altered Mental Status and Recurrent UTI  Level 5 exemption history limited by the patient's clinical condition  HPI Destiny Mack is a 72 y.o. female who comes to the emergency department via EMS with several days of progressive confusion and weakness.  The patient has a complex past medical history including atrial fibrillation and frequent urinary tract infections.  She was recently discharged from our hospital 2 weeks ago after a UTI.  She was discharged home on antibiotics and family felt that the patient was initially improving however once the antibiotic stopped they noted that her confusion worsened.  She has had a slightly productive cough for the past several days.  The patient herself has no complaints at this time and is unable to provide any meaningful history.  Past Medical History:  Diagnosis Date  . A-fib (HCC)   . Depression   . Frequent falls   . Hypertension   . Leaking of urine   . Mixed incontinence   . Stroke (HCC)   . Tremor of hands and face     Patient Active Problem List   Diagnosis Date Noted  . Pneumonia 12/31/2017  . Hypotension 12/13/2017  . Diarrhea 11/22/2017  . Campylobacter diarrhea 11/22/2017  . Acute encephalopathy 10/29/2017  . Pressure injury of skin 10/29/2017    Past Surgical History:  Procedure Laterality Date  . APPENDECTOMY    . TUBAL LIGATION      Prior to Admission medications   Medication Sig Start Date End Date Taking? Authorizing Provider  amLODipine (NORVASC) 5 MG tablet Take 5 mg by mouth daily.   Yes [provider]  apixaban (ELIQUIS) 5 MG TABS tablet Take 1 tablet (5 mg total) by mouth 2 (two) times daily. 12/15/17 01/14/18 Yes  Pyreddy, Vivien Rota, MD  atorvastatin (LIPITOR) 20 MG tablet Take 20 mg by mouth at bedtime.    Yes [provider]  lisinopril (PRINIVIL,ZESTRIL) 30 MG tablet Take 30 mg by mouth daily.   Yes [provider]  aspirin 325 MG tablet Take 1 tablet (325 mg total) by mouth daily. Patient not taking: Reported on 12/31/2017 12/16/17   Ihor Austin, MD    Allergies Patient has no known allergies.  Family History  Problem Relation Age of Onset  . Dementia Mother   . Heart failure Mother   . Heart failure Father   . CAD Father     Social History Social History   Tobacco Use  . Smoking status: Current Every Day Smoker    Packs/day: 0.50    Years: 56.00    Pack years: 28.00    Last attempt to quit: 11/22/2017    Years since quitting: 0.1  . Smokeless tobacco: Never Used  Substance Use Topics  . Alcohol use: No    Frequency: Never  . Drug use: No    Review of Systems Level 5 exemption history limited by the patient's clinical condition  ____________________________________________   PHYSICAL EXAM:  VITAL SIGNS: ED Triage Vitals  Enc Vitals Group     BP      Pulse      Resp      Temp      Temp src      SpO2  Weight      Height      Head Circumference      Peak Flow      Pain Score      Pain Loc      Pain Edu?      Excl. in GC?     Constitutional: Pleasant cooperative appears somewhat short of breath with a wet sounding cough Eyes: PERRL EOMI. Head: Atraumatic. Nose: No congestion/rhinnorhea. Mouth/Throat: No trismus Neck: No stridor.   Cardiovascular: Normal rate, regular rhythm. Grossly normal heart sounds.  Good peripheral circulation. Respiratory: Increased respiratory effort with rales at right base although moving good air Gastrointestinal: Soft nontender.  No CVAT Musculoskeletal: No lower extremity edema   Neurologic: Moves all 4 Skin:  Skin is warm, dry and intact. No rash noted. Psychiatric pleasantly  confused    ____________________________________________   DIFFERENTIAL includes but not limited to  Urinary tract infection, pyelonephritis, dehydration, pneumonia, pneumothorax ____________________________________________   LABS (all labs ordered are listed, but only abnormal results are displayed)  Labs Reviewed  COMPREHENSIVE METABOLIC PANEL - Abnormal; Notable for the following components:      Result Value   CO2 20 (*)    Glucose, Bld 113 (*)    BUN 23 (*)    Creatinine, Ser 1.90 (*)    Calcium 8.2 (*)    Albumin 3.1 (*)    Alkaline Phosphatase 176 (*)    GFR calc non Af Amer 25 (*)    GFR calc Af Amer 29 (*)    All other components within normal limits  TROPONIN I - Abnormal; Notable for the following components:   Troponin I 0.03 (*)    All other components within normal limits  CBC WITH DIFFERENTIAL/PLATELET - Abnormal; Notable for the following components:   WBC 11.6 (*)    RDW 16.9 (*)    Neutro Abs 10.0 (*)    Lymphs Abs 0.6 (*)    All other components within normal limits  PROTIME-INR - Abnormal; Notable for the following components:   Prothrombin Time 16.1 (*)    All other components within normal limits  URINALYSIS, ROUTINE W REFLEX MICROSCOPIC - Abnormal; Notable for the following components:   Color, Urine AMBER (*)    APPearance CLOUDY (*)    Hgb urine dipstick LARGE (*)    Protein, ur 100 (*)    Leukocytes, UA LARGE (*)    RBC / HPF >50 (*)    WBC, UA >50 (*)    Bacteria, UA RARE (*)    All other components within normal limits  CULTURE, BLOOD (ROUTINE X 2)  CULTURE, BLOOD (ROUTINE X 2)  URINE CULTURE  CULTURE, EXPECTORATED SPUTUM-ASSESSMENT  MRSA PCR SCREENING  LACTIC ACID, PLASMA  LIPASE, BLOOD  PROCALCITONIN  LACTIC ACID, PLASMA  PROCALCITONIN    Lab work reviewed by me shows acute kidney injury along with slightly elevated troponin which is nonspecific.  Urinalysis concerning for possible  infection. __________________________________________  EKG    ____________________________________________  RADIOLOGY  Chest x-ray reviewed by me consistent with right lower lobe pneumonia ____________________________________________   PROCEDURES  Procedure(s) performed: no  .Critical Care Performed by: Merrily Brittle, MD Authorized by: Merrily Brittle, MD   Critical care provider statement:    Critical care time (minutes):  30   Critical care time was exclusive of:  Separately billable procedures and treating other patients   Critical care was necessary to treat or prevent imminent or life-threatening deterioration of the following conditions:  Sepsis and  respiratory failure   Critical care was time spent personally by me on the following activities:  Development of treatment plan with patient or surrogate, discussions with consultants, evaluation of patient's response to treatment, examination of patient, obtaining history from patient or surrogate, ordering and performing treatments and interventions, ordering and review of laboratory studies, ordering and review of radiographic studies, pulse oximetry, re-evaluation of patient's condition and review of old charts    Critical Care performed: Yes  Observation: no ____________________________________________   INITIAL IMPRESSION / ASSESSMENT AND PLAN / ED COURSE  Pertinent labs & imaging results that were available during my care of the patient were reviewed by me and considered in my medical decision making (see chart for details).  The patient arrives pleasantly confused.  Most concerning is her new hypoxia.  She is febrile.  Broad labs are pending including in and out urinalysis and given my high clinical suspicion for urinary tract infection I will cover her with a gram of ceftriaxone now.  Unfortunately there was no urine culture sent on her previous admission.  Patient's lab work is back showing acute kidney  injury.  Her chest x-ray appears to show a right lower lobe pneumonia so I will expand her antibiotic coverage to cover for hospital-acquired pneumonia.  At this point she requires inpatient admission for IV fluids, IV antibiotics, and continued oxygen supplementation.  I discussed with the hospitalist who has graciously agreed to admit the patient to his service.      ____________________________________________   FINAL CLINICAL IMPRESSION(S) / ED DIAGNOSES  Final diagnoses:  Delirium  Acute kidney injury (HCC)  Sepsis, due to unspecified organism [facility])  Hospital acquired PNA      NEW MEDICATIONS STARTED DURING THIS VISIT:  New Prescriptions   No medications on file     Note:  This document was prepared using Dragon voice recognition software and may include unintentional dictation errors.     Merrily Brittle, MD 12/31/17 [phone]

## 2017-12-31 NOTE — ED Notes (Signed)
Patient transported to room 120 by this EDT.  

## 2017-12-31 NOTE — Progress Notes (Signed)
Pharmacy Antibiotic Note  Destiny Mack is a 72 y.o. female with a h/o recent admission for UTI admitted on 12/31/2017 with pneumonia and sepsis.  Pharmacy has been consulted for vancomycin and cefepime dosing.  Plan: Cefepime 2 g iv q 24 hours.   Vancomycin 1000 mg iv once followed by 750 mg iv q 24 hours with stacked dosing. Will f/u MRSA PCR.   Height:  (162.6 cm) Weight: 156 lb (70.8 kg) IBW/kg (Calculated) : 54.7  Temp (24hrs), Avg:102.1 F (38.9 C), Min:102.1 F (38.9 C), Max:102.1 F (38.9 C)  Recent Labs  Lab 12/31/17 1357  WBC 11.6*  CREATININE 1.90*  LATICACIDVEN 1.1    Estimated Creatinine Clearance: 25.8 mL/min (A) (by C-G formula based on SCr of 1.9 mg/dL (H)).    No Known Allergies  Antimicrobials this admission: ceftriaxone 5/6 x 1 vancomycin 5/6 >>  Cefepime 5/6 >>  Dose adjustments this admission:  Microbiology results: BCx: sent UCx: sent  Sputum: sent  MRSA PCR: sent  Thank you for allowing pharmacy to be a part of this patient's care.  Valentina Gu 12/31/2017 3:48 PM

## 2017-12-31 NOTE — Progress Notes (Signed)
CODE SEPSIS - PHARMACY COMMUNICATION  **Broad Spectrum Antibiotics should be administered within 1 hour of Sepsis diagnosis**  Time Code Sepsis Called/Page Received: 1352  Antibiotics Ordered: CTX  Time of 1st antibiotic administration: 1410  Additional action taken by pharmacy:   If necessary, Name of Provider/Nurse Contacted:     Valentina Gu ,PharmD Clinical Pharmacist  12/31/2017  2:07 PM

## 2017-12-31 NOTE — Plan of Care (Signed)
  Problem: Respiratory: Goal: Ability to maintain adequate ventilation will improve Outcome: Progressing Goal: Ability to maintain a clear airway will improve Outcome: Progressing   Problem: Education: Goal: Knowledge of General Education information will improve Outcome: Progressing   Problem: Nutrition: Goal: Adequate nutrition will be maintained Outcome: Progressing   Problem: Elimination: Goal: Will not experience complications related to bowel motility Outcome: Progressing   Problem: Safety: Goal: Ability to remain free from injury will improve Outcome: Progressing

## 2017-12-31 NOTE — ED Triage Notes (Signed)
Pt in via [facility] from home with complaints of increased confusion x a few days, recently treated for UTI, finished antibiotics x 3 days ago.  Pt febrile upon arrival, other vitals WDL.  Pt A/Ox4, unable to tell me why she is here.

## 2017-12-31 NOTE — ED Notes (Signed)
Attempted to call report, per charge RN, bed has not been assigned to nurse at this time.  Name and ascom number left to receive call back.

## 2017-12-31 NOTE — ED Notes (Signed)
Hospitalist to bedside at this time 

## 2017-12-31 NOTE — H&P (Signed)
[facility] - [place] at [facility]   PATIENT NAME: Destiny Mack    MR#:  [id number]  DATE OF BIRTH:  07-25-1946  DATE OF ADMISSION:  12/31/2017  PRIMARY CARE PHYSICIAN: Oswaldo Conroy, MD   REQUESTING/REFERRING PHYSICIAN: Dr. Pershing Proud  CHIEF COMPLAINT:   Chief Complaint  Patient presents with  . Altered Mental Status  . Recurrent UTI    HISTORY OF PRESENT ILLNESS:  Destiny Mack  is a 72 y.o. female with a known history of hypertension, recent admission for UTI returns to the emergency room due to confusion and shortness of breath.  Saturations were found to be 85% on room air.  New diagnosis of right lower lobe pneumonia.  Patient is a poor historian.  She does tell me that she has been coughing and feeling weak.  She finished her antibiotics 3 days back for sepsis present on admission.  Fever of 102.5 and tachycardic.  PAST MEDICAL HISTORY:   Past Medical History:  Diagnosis Date  . A-fib (HCC)   . Depression   . Frequent falls   . Hypertension   . Leaking of urine   . Mixed incontinence   . Stroke (HCC)   . Tremor of hands and face     PAST SURGICAL HISTORY:   Past Surgical History:  Procedure Laterality Date  . APPENDECTOMY    . TUBAL LIGATION      SOCIAL HISTORY:   Social History   Tobacco Use  . Smoking status: Current Every Day Smoker    Packs/day: 0.50    Years: 56.00    Pack years: 28.00    Last attempt to quit: 11/22/2017    Years since quitting: 0.1  . Smokeless tobacco: Never Used  Substance Use Topics  . Alcohol use: No    Frequency: Never    FAMILY HISTORY:   Family History  Problem Relation Age of Onset  . Dementia Mother   . Heart failure Mother   . Heart failure Father   . CAD Father     DRUG ALLERGIES:  No Known Allergies  REVIEW OF SYSTEMS:   Review of Systems  Unable to perform ROS: Mental status change    MEDICATIONS AT HOME:   Prior to Admission medications   Medication Sig Start Date End Date  Taking? Authorizing Provider  amLODipine (NORVASC) 5 MG tablet Take 5 mg by mouth daily.   Yes [provider]  apixaban (ELIQUIS) 5 MG TABS tablet Take 1 tablet (5 mg total) by mouth 2 (two) times daily. 12/15/17 01/14/18 Yes Pyreddy, Vivien Rota, MD  atorvastatin (LIPITOR) 20 MG tablet Take 20 mg by mouth at bedtime.    Yes [provider]  lisinopril (PRINIVIL,ZESTRIL) 30 MG tablet Take 30 mg by mouth daily.   Yes [provider]  aspirin 325 MG tablet Take 1 tablet (325 mg total) by mouth daily. Patient not taking: Reported on 12/31/2017 12/16/17   Ihor Austin, MD     VITAL SIGNS:  Blood pressure (!) 138/47, pulse 76, temperature (!) 102.1 F (38.9 C), temperature source Oral, resp. rate (!) 26, height  (1.626 m), weight 70.8 kg (156 lb), SpO2 95 %.  PHYSICAL EXAMINATION:  Physical Exam  GENERAL:  72 y.o.-year-old patient lying in the bed with no acute distress.  EYES: Pupils equal, round, reactive to light and accommodation. No scleral icterus. Extraocular muscles intact.  HEENT: Head atraumatic, normocephalic. Oropharynx and nasopharynx clear. No oropharyngeal erythema, moist oral mucosa  NECK:  Supple,  no jugular venous distention. No thyroid enlargement, no tenderness.  LUNGS: N bilateral wheezing and decreased air entry CARDIOVASCULAR: S1, S2 normal. No murmurs, rubs, or gallops.  ABDOMEN: Soft, nontender, nondistended. Bowel sounds present. No organomegaly or mass.  EXTREMITIES: No pedal edema, cyanosis, or clubbing. + 2 pedal & radial pulses b/l.   NEUROLOGIC: Cranial nerves II through XII are intact. No focal Motor or sensory deficits appreciated b/l PSYCHIATRIC: The patient is alert and awake.  Confused. SKIN: No obvious rash, lesion, or ulcer.   LABORATORY PANEL:   CBC Recent Labs  Lab 12/31/17 1357  WBC 11.6*  HGB 12.0  HCT 36.0  PLT 216    ------------------------------------------------------------------------------------------------------------------  Chemistries  Recent Labs  Lab 12/31/17 1357  NA 135  K 3.9  CL 104  CO2 20*  GLUCOSE 113*  BUN 23*  CREATININE 1.90*  CALCIUM 8.2*  AST 28  ALT 14  ALKPHOS 176*  BILITOT 1.0   ------------------------------------------------------------------------------------------------------------------  Cardiac Enzymes Recent Labs  Lab 12/31/17 1357  TROPONINI 0.03*   ------------------------------------------------------------------------------------------------------------------  RADIOLOGY:  Dg Chest Port 1 View  Result Date: 12/31/2017 CLINICAL DATA:  72 year old female with a history confusion EXAM: PORTABLE CHEST 1 VIEW COMPARISON:  10/29/2017, 03/02/2007 FINDINGS: New airspace opacity at the right lower lung. Cardiomediastinal silhouette unchanged in size and contour. No pneumothorax.  No pleural effusion. Degenerative changes of the shoulders.  No acute displaced fracture IMPRESSION: New airspace opacity at the right lung base suggesting lobar pneumonia. Followup PA and lateral chest X-ray is recommended in 3-4 weeks following trial of therapy to assure resolution. Electronically Signed   By: Gilmer Mor D.O.   On: 12/31/2017 14:23     IMPRESSION AND PLAN:   *Right lower lobe healthcare acquired pneumonia Patient also has wheezing Start broad-spectrum IV antibiotics, steroids, nebulizers.  Oxygen to be weaned to room air with saturations greater than 92% Blood cultures and sputum cultures  *Sepsis present on admission.  *Acute metabolic encephalopathy due to pneumonia is improving since arriving at the emergency room.  Paroxysmal atrial fibrillation.  Patient is not on any rate control medications.  On Eliquis which will be continued.  All the records are reviewed and case discussed with ED provider. Management plans discussed with the patient, family  and they are in agreement.  CODE STATUS: DO NOT RESUSCITATE.  Recent admission had DNR.  TOTAL TIME TAKING CARE OF THIS PATIENT: 45 minutes.   Molinda Bailiff Allex Lapoint M.D on 12/31/2017 at 3:38 PM  Between 7am to 6pm - Pager - [phone]  After 6pm go to [email or web address] - password [email or web address] [facility]  [facility] [place] [facility]  Office  [phone]  CC: Primary care physician; Oswaldo Conroy, MD  Note: This dictation was prepared with Dragon dictation along with smaller phrase technology. Any transcriptional errors that result from this process are unintentional.

## 2018-01-01 ENCOUNTER — Inpatient Hospital Stay (HOSPITAL_COMMUNITY)
Admit: 2018-01-01 | Discharge: 2018-01-01 | Disposition: A | Discharge status: Home / Self Care | Payer: Medicare HMO | Attending: Infectious Diseases | Admitting: Infectious Diseases

## 2018-01-01 DIAGNOSIS — I340 Nonrheumatic mitral (valve) insufficiency: Secondary | ICD-10-CM

## 2018-01-01 DIAGNOSIS — E440 Moderate protein-calorie malnutrition: Secondary | ICD-10-CM

## 2018-01-01 LAB — CBC
HCT: 33.9 % — ABNORMAL LOW (ref 35.0–47.0)
Hemoglobin: 11.3 g/dL — ABNORMAL LOW (ref 12.0–16.0)
MCH: 30.2 pg (ref 26.0–34.0)
MCHC: 33.3 g/dL (ref 32.0–36.0)
MCV: 90.7 fL (ref 80.0–100.0)
PLATELETS: 205 10*3/uL (ref 150–440)
RBC: 3.74 MIL/uL — ABNORMAL LOW (ref 3.80–5.20)
RDW: 16.6 % — AB (ref 11.5–14.5)
WBC: 7.1 10*3/uL (ref 3.6–11.0)

## 2018-01-01 LAB — BLOOD CULTURE ID PANEL (REFLEXED)
ACINETOBACTER BAUMANNII: NOT DETECTED
CANDIDA PARAPSILOSIS: NOT DETECTED
Candida albicans: NOT DETECTED
Candida glabrata: NOT DETECTED
Candida krusei: NOT DETECTED
Candida tropicalis: NOT DETECTED
ENTEROCOCCUS SPECIES: NOT DETECTED
Enterobacter cloacae complex: NOT DETECTED
Enterobacteriaceae species: NOT DETECTED
Escherichia coli: NOT DETECTED
HAEMOPHILUS INFLUENZAE: NOT DETECTED
KLEBSIELLA OXYTOCA: NOT DETECTED
Klebsiella pneumoniae: NOT DETECTED
Listeria monocytogenes: NOT DETECTED
Methicillin resistance: NOT DETECTED
NEISSERIA MENINGITIDIS: NOT DETECTED
PSEUDOMONAS AERUGINOSA: NOT DETECTED
Proteus species: NOT DETECTED
SERRATIA MARCESCENS: NOT DETECTED
STAPHYLOCOCCUS AUREUS BCID: DETECTED — AB
STREPTOCOCCUS PNEUMONIAE: NOT DETECTED
Staphylococcus species: DETECTED — AB
Streptococcus agalactiae: NOT DETECTED
Streptococcus pyogenes: NOT DETECTED
Streptococcus species: NOT DETECTED

## 2018-01-01 LAB — BASIC METABOLIC PANEL
Anion gap: 10 (ref 5–15)
BUN: 25 mg/dL — AB (ref 6–20)
CHLORIDE: 108 mmol/L (ref 101–111)
CO2: 19 mmol/L — AB (ref 22–32)
CREATININE: 1.75 mg/dL — AB (ref 0.44–1.00)
Calcium: 7.9 mg/dL — ABNORMAL LOW (ref 8.9–10.3)
GFR calc Af Amer: 32 mL/min — ABNORMAL LOW (ref >60)
GFR calc non Af Amer: 28 mL/min — ABNORMAL LOW (ref >60)
Glucose, Bld: 157 mg/dL — ABNORMAL HIGH (ref 65–99)
Potassium: 4 mmol/L (ref 3.5–5.1)
SODIUM: 137 mmol/L (ref 135–145)

## 2018-01-01 LAB — URINE CULTURE: Culture: NO GROWTH

## 2018-01-01 LAB — PROCALCITONIN: Procalcitonin: 0.18 ng/mL

## 2018-01-01 MED ORDER — SODIUM CHLORIDE 0.9 % IV SOLN
INTRAVENOUS | Status: DC
  Administered 2018-01-01 – 2018-01-02 (×2): via INTRAVENOUS

## 2018-01-01 MED ORDER — ENSURE ENLIVE PO LIQD
237.0000 mL | Freq: Two times a day (BID) | ORAL | Status: DC
  Administered 2018-01-03 (×2): 237 mL via ORAL

## 2018-01-01 MED ORDER — CEFAZOLIN SODIUM-DEXTROSE 2-4 GM/100ML-% IV SOLN
2.0000 g | Freq: Two times a day (BID) | INTRAVENOUS | Status: DC
  Administered 2018-01-01 – 2018-01-04 (×6): 2 g via INTRAVENOUS
  Filled 2018-01-01 (×8): qty 100

## 2018-01-01 MED ORDER — IPRATROPIUM-ALBUTEROL 0.5-2.5 (3) MG/3ML IN SOLN
3.0000 mL | Freq: Three times a day (TID) | RESPIRATORY_TRACT | Status: DC
  Administered 2018-01-01 – 2018-01-03 (×5): 3 mL via RESPIRATORY_TRACT
  Filled 2018-01-01 (×5): qty 3

## 2018-01-01 MED ORDER — ADULT MULTIVITAMIN W/MINERALS CH
1.0000 | ORAL_TABLET | Freq: Every day | ORAL | Status: DC
  Administered 2018-01-02 – 2018-01-03 (×2): 1 via ORAL
  Filled 2018-01-01 (×2): qty 1

## 2018-01-01 NOTE — Consult Note (Signed)
Pharmacy Antibiotic Note  Destiny Mack is a 72 y.o. female admitted on 12/31/2017 with bacteremia.  Pharmacy has been consulted for cefazolin dosing.  Plan: cefazolin 2g q 12 hr  Height:  (162.6 cm) Weight: 133 lb (60.3 kg) IBW/kg (Calculated) : 54.7  Temp (24hrs), Avg:98.3 F (36.8 C), Min:97.8 F (36.6 C), Max:98.6 F (37 C)  Recent Labs  Lab 12/31/17 1357 12/31/17 1703 01/01/18 0735  WBC 11.6*  --  7.1  CREATININE 1.90*  --  1.75*  LATICACIDVEN 1.1 0.7  --     Estimated Creatinine Clearance: 25.1 mL/min (A) (by C-G formula based on SCr of 1.75 mg/dL (H)).    No Known Allergies  Antimicrobials this admission: cefepime 5/6 >> 5/7 vancomycin 5/6 >> 5/7 Cefazolin 5/7>>  Dose adjustments this admission:   Microbiology results: 5/6 BCx: 1/2 MSSA 5/6 UCx: NG    Thank you for allowing pharmacy to be a part of this patient's care.  Olene Floss, Pharm.D, BCPS Clinical Pharmacist  01/01/2018 3:52 PM

## 2018-01-01 NOTE — Plan of Care (Signed)
Inf disease saw today abx  changed.  Ivfs cont Problem: Respiratory: Goal: Ability to maintain adequate ventilation will improve Outcome: Progressing Goal: Ability to maintain a clear airway will improve Outcome: Progressing   Problem: Education: Goal: Knowledge of General Education information will improve Outcome: Progressing   Problem: Nutrition: Goal: Adequate nutrition will be maintained Outcome: Progressing   Problem: Elimination: Goal: Will not experience complications related to bowel motility Outcome: Progressing   Problem: Safety: Goal: Ability to remain free from injury will improve Outcome: Progressing

## 2018-01-01 NOTE — Progress Notes (Signed)
Paged MD regarding BP (see flowsheet). See new orders.

## 2018-01-01 NOTE — Progress Notes (Signed)
Advanced Care Plan.  Purpose of Encounter: CODE STATUS. Parties in Attendance: The patient and me. Patient's Decisional Capacity: Yes. Medical Story: Destiny Mack  is a 72 y.o. female with a known history of hypertension, recent admission for UTI, CKD, Afib, CVA, HTN, frequent falls.  She is admitted for pneumonia with sepsis.  She is found to have MSSA bacteremia.  I discussed with the patient about her current condition, prognosis and CODE STATUS.  She said she does not want to be resuscitated or intubated.  Plan:  Code Status: DNR. Time spent discussing advance care planning: 17 minutes.

## 2018-01-01 NOTE — Discharge Instructions (Signed)
Nutrition Post Hospital Stay °Proper nutrition can help your body recover from illness and injury.   °Foods and beverages high in protein, vitamins, and minerals help rebuild muscle loss, promote healing, & reduce fall risk.  ° °•In addition to eating healthy foods, a nutrition shake is an easy, delicious way to get the nutrition you need during and after your hospital stay ° °It is recommended that you continue to drink 2 bottles per day of:       Ensure Enlive ° °Tips for adding a nutrition shake into your routine: °As allowed, drink one with vitamins or medications instead of water or juice °Enjoy one as a tasty mid-morning or afternoon snack °Drink cold or make a milkshake out of it °Drink one instead of milk with cereal or snacks °Use as a coffee creamer °  °Available at the following grocery stores and pharmacies:           °* [employer] * [employer] * [employer]  °* R[employer] Aid          * Walmart * [employer]  °* Wal[facility]reens      * Target  * BJ's   °* [facility]S  * [employer]   °* [facility] [phone]  °          °For COUPONS visit: [email or web address]/join or [email or web address]  ° °Suggested Substitutions °Ensure Plus = Boost Plus = Carnation Breakfast Essentials = Boost Compact °Ensure Active Clear = Boost Breeze °Glucerna Shake = Boost Glucose Control = Carnation Breakfast Essentials SUGAR FREE ° °  ° °

## 2018-01-01 NOTE — Progress Notes (Signed)
PHARMACY - PHYSICIAN COMMUNICATION CRITICAL VALUE ALERT - BLOOD CULTURE IDENTIFICATION (BCID)  Destiny Mack is an 72 y.o. female who presented to [facility] on 12/31/2017 with a chief complaint of UTI and new RLL PNA (HCAP)  Assessment:   UCx sent Sputum cx ordered MRSA PCR neg BCx 1/4 bottles GPC - per BCID MSSA and staph spp, MecA neg  Name of physician (or Provider) Contacted: Dr. Imogene Burn  Current antibiotics: vancomycin and cefepime  Changes to prescribed antibiotics recommended:  Recommendations accepted by provider - recommended to d/c vancomycin and continue cefepime at this time.   Results for orders placed or performed during the hospital encounter of 12/31/17  Blood Culture ID Panel (Reflexed) (Collected: 12/31/2017  1:57 PM)  Result Value Ref Range   Enterococcus species NOT DETECTED NOT DETECTED   Listeria monocytogenes NOT DETECTED NOT DETECTED   Staphylococcus species DETECTED (A) NOT DETECTED   Staphylococcus aureus DETECTED (A) NOT DETECTED   Methicillin resistance NOT DETECTED NOT DETECTED   Streptococcus species NOT DETECTED NOT DETECTED   Streptococcus agalactiae NOT DETECTED NOT DETECTED   Streptococcus pneumoniae NOT DETECTED NOT DETECTED   Streptococcus pyogenes NOT DETECTED NOT DETECTED   Acinetobacter baumannii NOT DETECTED NOT DETECTED   Enterobacteriaceae species NOT DETECTED NOT DETECTED   Enterobacter cloacae complex NOT DETECTED NOT DETECTED   Escherichia coli NOT DETECTED NOT DETECTED   Klebsiella oxytoca NOT DETECTED NOT DETECTED   Klebsiella pneumoniae NOT DETECTED NOT DETECTED   Proteus species NOT DETECTED NOT DETECTED   Serratia marcescens NOT DETECTED NOT DETECTED   Haemophilus influenzae NOT DETECTED NOT DETECTED   Neisseria meningitidis NOT DETECTED NOT DETECTED   Pseudomonas aeruginosa NOT DETECTED NOT DETECTED   Candida albicans NOT DETECTED NOT DETECTED   Candida glabrata NOT DETECTED NOT DETECTED   Candida krusei NOT DETECTED NOT DETECTED    Candida parapsilosis NOT DETECTED NOT DETECTED   Candida tropicalis NOT DETECTED NOT DETECTED    [name] 01/01/2018  8:22 AM

## 2018-01-01 NOTE — Care Management (Signed)
RNCM notified by [facility] RN that patient is not followed as outpatient with [facility] and does not qualify.

## 2018-01-01 NOTE — Plan of Care (Signed)
  Problem: Respiratory: Goal: Ability to maintain adequate ventilation will improve Outcome: Progressing Goal: Ability to maintain a clear airway will improve Outcome: Progressing   Problem: Education: Goal: Knowledge of General Education information will improve Outcome: Progressing   Problem: Elimination: Goal: Will not experience complications related to bowel motility Outcome: Progressing   Problem: Safety: Goal: Ability to remain free from injury will improve Outcome: Progressing

## 2018-01-01 NOTE — Progress Notes (Addendum)
[employer] - [place] at [facility]   PATIENT NAME: Destiny Mack    MR#:  [id number]  DATE OF BIRTH:  Jul 04, 1946  SUBJECTIVE:  CHIEF COMPLAINT:   Chief Complaint  Patient presents with  . Altered Mental Status  . Recurrent UTI   The patient feels chills.  No other complaints. REVIEW OF SYSTEMS:  Review of Systems  Constitutional: Positive for chills. Negative for fever and malaise/fatigue.  HENT: Negative for sore throat.   Eyes: Negative for blurred vision and double vision.  Respiratory: Negative for cough, hemoptysis, shortness of breath, wheezing and stridor.   Cardiovascular: Negative for chest pain, palpitations, orthopnea and leg swelling.  Gastrointestinal: Negative for abdominal pain, blood in stool, diarrhea, melena, nausea and vomiting.  Genitourinary: Negative for dysuria, flank pain and hematuria.  Musculoskeletal: Negative for back pain and joint pain.  Skin: Negative for rash.  Neurological: Negative for dizziness, sensory change, focal weakness, seizures, loss of consciousness, weakness and headaches.  Endo/Heme/Allergies: Negative for polydipsia.  Psychiatric/Behavioral: Negative for depression. The patient is not nervous/anxious.     DRUG ALLERGIES:  No Known Allergies VITALS:  Blood pressure (!) 139/49, pulse 88, temperature 98.6 F (37 C), temperature source Oral, resp. rate 20, height  (1.626 m), weight 133 lb (60.3 kg), SpO2 98 %. PHYSICAL EXAMINATION:  Physical Exam  Constitutional: She is oriented to person, place, and time.  HENT:  Head: Normocephalic.  Mouth/Throat: Oropharynx is clear and moist.  Eyes: Pupils are equal, round, and reactive to light. Conjunctivae and EOM are normal. No scleral icterus.  Neck: Normal range of motion. Neck supple. No JVD present. No tracheal deviation present.  Cardiovascular: Normal rate, regular rhythm and normal heart sounds. Exam reveals no gallop.  No murmur heard. Pulmonary/Chest:  Effort normal and breath sounds normal. No respiratory distress. She has no wheezes. She has no rales.  Abdominal: Soft. Bowel sounds are normal. She exhibits no distension. There is no tenderness. There is no rebound.  Musculoskeletal: Normal range of motion. She exhibits no edema or tenderness.  Neurological: She is alert and oriented to person, place, and time. No cranial nerve deficit.  Skin: No rash noted. No erythema.  Psychiatric: She has a normal mood and affect.   LABORATORY PANEL:  Female CBC Recent Labs  Lab 01/01/18 0735  WBC 7.1  HGB 11.3*  HCT 33.9*  PLT 205   ------------------------------------------------------------------------------------------------------------------ Chemistries  Recent Labs  Lab 12/31/17 1357 01/01/18 0735  NA 135 137  K 3.9 4.0  CL 104 108  CO2 20* 19*  GLUCOSE 113* 157*  BUN 23* 25*  CREATININE 1.90* 1.75*  CALCIUM 8.2* 7.9*  AST 28  --   ALT 14  --   ALKPHOS 176*  --   BILITOT 1.0  --    RADIOLOGY:  No results found. ASSESSMENT AND PLAN:   *Right lower lobe healthcare acquired pneumonia and MSSA bacteremia. Sepsis present on admission. She is treated with cefepime and vancomycin.    Change to Ancef, Repeat blood culture, TTE, she may need TEE if TTE is negative, Once bcx neg x 48 hours can place PICC - likely Thursday afternoon Will need min 2 weeks IV ancef if TEE negative per Dr. Sampson Goon.  *Acute metabolic encephalopathy due to above, Improved.  ARF on CKD stage 3.  Continue IV fluid support.  Paroxysmal atrial fibrillation.  Patient is not on any rate control medications.  On Eliquis. History of embolic CVAs.  On Eliquis.  Tobacco  abuse.  Smoking cessation was counseled for 4 minutes. All the records are reviewed and case discussed with Care Management/Social Worker. Management plans discussed with the patient, family and they are in agreement.  CODE STATUS: DNR  TOTAL TIME TAKING CARE OF THIS PATIENT: 36  minutes.   More than 50% of the time was spent in counseling/coordination of care: YES  POSSIBLE D/C IN 3 DAYS, DEPENDING ON CLINICAL CONDITION.   Shaune Pollack M.D on 01/01/2018 at 4:31 PM  Between 7am to 6pm - Pager - [phone]  After 6pm go to [email or web address] - Therapist, nutritional Hospitalists

## 2018-01-01 NOTE — Progress Notes (Signed)
Initial Nutrition Assessment  DOCUMENTATION CODES:   Non-severe (moderate) malnutrition in context of chronic illness  INTERVENTION:  Provide Ensure Enlive po BID, each supplement provides 350 kcal and 20 grams of protein.  Provide daily MVI.  Discussed importance of taking in adequate calories and protein from meals, snacks, and ONS to prevent any further unintentional weight loss or loss of lean body mass. Encouraged patient to continue drinking an ONS after discharge.  NUTRITION DIAGNOSIS:   Moderate Malnutrition related to chronic illness(hx CVA, decreased appetite and oral intake) as evidenced by moderate fat depletion, moderate muscle depletion, severe muscle depletion.  GOAL:   Patient will meet greater than or equal to 90% of their needs  MONITOR:   PO intake, Supplement acceptance, Labs, Weight trends, Skin, I & O's  REASON FOR ASSESSMENT:   Malnutrition Screening Tool    ASSESSMENT:   72 year old female with PMHx of tremor of hands and face, frequent falls, depression, hx CVA, HTN, A-fib who is now admitted with PNA, sepsis, acute metabolic encephalopathy.   Met with patient at bedside. She reports her appetite comes and goes, but overall it has been decreased for about 6 months now. She only eats 1 meal usually, but occasionally has a second meal. She typically has a meat and two vegetables. Her daughter prepares her meals since she has not been feeling well lately. She reports her appetite is a little improved here and that she is tolerating her diet here.  UBW was 180 lbs but patient is unsure when she last weighed this. RD obtained bed scale weight of 133.8 lbs. Patient has lost approximately 46 lbs (25.6% body weight) over an unknown time frame. Weight history in chart only goes back approximately 2 months, so unable to see when weight loss started.  Medications reviewed and include: Eliquis, Solu-Medrol 60 mg Q24hrs IV, NS @ 75 mL/hr.  Labs reviewed: CO2 19,  BUN 25, Creatinine 1.75.  NUTRITION - FOCUSED PHYSICAL EXAM:    Most Recent Value  Orbital Region  Moderate depletion  Upper Arm Region  Moderate depletion  Thoracic and Lumbar Region  Moderate depletion  Buccal Region  Moderate depletion  Temple Region  Severe depletion  Clavicle Bone Region  Moderate depletion  Clavicle and Acromion Bone Region  Moderate depletion  Scapular Bone Region  Moderate depletion  Dorsal Hand  Severe depletion  Patellar Region  Moderate depletion  Anterior Thigh Region  Moderate depletion  Posterior Calf Region  Moderate depletion  Edema (RD Assessment)  -- [non-pitting edema to bilateral lower extremities]  Hair  Reviewed [dull, dry]  Eyes  Reviewed  Mouth  Reviewed  Skin  Reviewed [ecchymosis]  Nails  Reviewed     Diet Order:   Diet Order           Diet 2 gram sodium Room service appropriate? Yes; Fluid consistency: Thin  Diet effective now          EDUCATION NEEDS:   Education needs have been addressed  Skin:  Skin Assessment: Reviewed RN Assessment  Last BM:  PTA (12/30/2017 per chart)  Height:   Ht Readings from Last 1 Encounters:  12/31/17 _0  (1.626 m)    Weight:   Wt Readings from Last 1 Encounters:  01/01/18 133 lb (60.3 kg)    Ideal Body Weight:  54.5 kg  BMI:  Body mass index is 22.83 kg/m.  Estimated Nutritional Needs:   Kcal:  1435-1655 (MSJ x 1.3-1.5)  Protein:  80-90 grams (  1.3-1.5 grams/kg)  Fluid:  1.5-1.8 L/day (25-30 mL/kg)  Willey Blade, MS, RD, LDN Office: [phone] Pager: [phone] After Hours/Weekend Pager: [phone]

## 2018-01-01 NOTE — Consult Note (Signed)
[facility] Infectious Disease     Reason for Consult: MSSA bacteremia   Referring Physician: Boykin Reaper Date of Admission:  12/31/2017   Active Problems:   Pneumonia   HPI: Destiny Mack is a 72 y.o. female with a history of hypertension recurrent urinary tract infections who was admitted May 6 with confusion and shortness of breath.  She was found to be hypoxic with sats 85%.  She apparently been coughing and feeling weak.  She had recently been treated with antibiotics but which ones were unclear.  On admission she did temperature 102.1 and white count of 11.6.  Imaging on admission showed new airspace opacity in the right lung base suggesting lobar pneumonia.  Urinalysis showed greater than 50 white cells.  Blood cultures turn positive for methicillin sensitive staph aureus. She has a  recent episodes of campylobacter GE and then admission from April 18 through the 20th with vomiting and acute renal failure.  At that time she had too numerous to count white cells and a urine sample that was negative.  In March  was also found to have acute CVA.  With small acute infarcts in the left occipital lobe and white matter of the left parietal and right temporal lobes.  She had multiple late subacute and chronic infarcts as well.  She had extensive small vessel disease.  She had had carotid Dopplers done earlier that showed less than 50% stenosis.  She did undergo an echocardiogram March 5 that showed function some mild thickened mitral valves and mitral valve regurgitation.  No cardioembolic source was found. Since admission she has been started vancomycin and ceftriaxone.  Her white count has come down to 7 and she is been afebrile.  Renal function has improved some with creatinine down from 1.90-1.75.   Past Medical History:  Diagnosis Date  . A-fib ([place])   . Depression   . Frequent falls   . Hypertension   . Leaking of urine   . Mixed incontinence   . Stroke ([place])   . Tremor of hands and face     Past Surgical History:  Procedure Laterality Date  . APPENDECTOMY    . TUBAL LIGATION     Social History   Tobacco Use  . Smoking status: Current Every Day Smoker    Packs/day: 0.50    Years: 56.00    Pack years: 28.00    Last attempt to quit: 11/22/2017    Years since quitting: 0.1  . Smokeless tobacco: Never Used  Substance Use Topics  . Alcohol use: No    Frequency: Never  . Drug use: No   Family History  Problem Relation Age of Onset  . Dementia Mother   . Heart failure Mother   . Heart failure Father   . CAD Father     Allergies: No Known Allergies  Current antibiotics: Antibiotics Given (last 72 hours)    Date/Time Action Medication Dose Rate   12/31/17 1410 New Bag/Given   cefTRIAXone (ROCEPHIN) 1 g in sodium chloride 0.9 % 100 mL IVPB 1 g 200 mL/hr   12/31/17 1845 New Bag/Given   ceFEPIme (MAXIPIME) 2 g in sodium chloride 0.9 % 100 mL IVPB 2 g 200 mL/hr   12/31/17 1858 New Bag/Given   vancomycin (VANCOCIN) IVPB 1000 mg/200 mL premix 1,000 mg 200 mL/hr      MEDICATIONS: . apixaban  5 mg Oral BID  . atorvastatin  20 mg Oral QHS  . ipratropium-albuterol  3 mL Nebulization Q6H  .  methylPREDNISolone (SOLU-MEDROL) injection  60 mg Intravenous Q24H    Review of Systems - 11 systems reviewed and negative per HPI   OBJECTIVE: Temp:  [97.8 F (36.6 C)-98.6 F (37 C)] 98.2 F (36.8 C) (05/07 0911) Pulse Rate:  [49-79] 79 (05/07 0911) Resp:  [16-26] 20 (05/07 0911) BP: (91-141)/(47-64) 141/51 (05/07 0911) SpO2:  [92 %-100 %] 97 % (05/07 0911) Weight:  [60 kg (132 lb 3.2 oz)-60.3 kg (133 lb)] 60.3 kg (133 lb) (05/07 0517) Physical Exam  Constitutional:  oriented to person, place, and time. appears well-developed and well-nourished. Disheveled, tremors HENT: [place]/AT, PERRLA, no scleral icterus Mouth/Throat: Oropharynx is clear and moist. No oropharyngeal exudate.  Cardiovascular: 2/6 sm Pulmonary/Chest:poor air movement, rhonchi bil bases Neck = supple,  no nuchal rigidity Abdominal: Soft. Bowel sounds are normal.  exhibits no distension. There is no tenderness.  Lymphadenopathy: no cervical adenopathy. No axillary adenopathy Neurological: alert and oriented to person, place, and time.  Skin: Skin is warm and dry. Multiple scratches and scabs. Psychiatric: a normal mood and affect.  behavior is normal.  Ext no cce.  LABS: Results for orders placed or performed during the hospital encounter of 12/31/17 (from the past 48 hour(s))  Lactic acid, plasma     Status: None   Collection Time: 12/31/17  1:57 PM  Result Value Ref Range   Lactic Acid, Venous 1.1 0.5 - 1.9 mmol/L    Comment: Performed at [facility], [address] [place], [place] [address]  Comprehensive metabolic panel     Status: Abnormal   Collection Time: 12/31/17  1:57 PM  Result Value Ref Range   Sodium 135 135 - 145 mmol/L   Potassium 3.9 3.5 - 5.1 mmol/L   Chloride 104 101 - 111 mmol/L   CO2 20 (L) 22 - 32 mmol/L   Glucose, Bld 113 (H) 65 - 99 mg/dL   BUN 23 (H) 6 - 20 mg/dL   Creatinine, Ser 1.90 (H) 0.44 - 1.00 mg/dL   Calcium 8.2 (L) 8.9 - 10.3 mg/dL   Total Protein 7.2 6.5 - 8.1 g/dL   Albumin 3.1 (L) 3.5 - 5.0 g/dL   AST 28 15 - 41 U/L   ALT 14 14 - 54 U/L   Alkaline Phosphatase 176 (H) 38 - 126 U/L   Total Bilirubin 1.0 0.3 - 1.2 mg/dL   GFR calc non Af Amer 25 (L) >60 mL/min   GFR calc Af Amer 29 (L) >60 mL/min    Comment: (NOTE) The eGFR has been calculated using the CKD EPI equation. This calculation has not been validated in all clinical situations. eGFR's persistently <60 mL/min signify possible Chronic Kidney Disease.    Anion gap 11 5 - 15    Comment: Performed at [facility], [address] [place], [place] [address]  Lipase, blood     Status: None   Collection Time: 12/31/17  1:57 PM  Result Value Ref Range   Lipase 17 11 - 51 U/L    Comment: Performed at [facility], [address] [place], [place]  [address]  Troponin I     Status: Abnormal   Collection Time: 12/31/17  1:57 PM  Result Value Ref Range   Troponin I 0.03 (HH) <0.03 ng/mL    Comment: CRITICAL RESULT CALLED TO, READ BACK BY AND VERIFIED WITH ASHLEY SMITH '@[id number]'  12/31/17 [facility] Performed at [facility], [address], [place], [place] [address]   CBC WITH DIFFERENTIAL  Status: Abnormal   Collection Time: 12/31/17  1:57 PM  Result Value Ref Range   WBC 11.6 (H) 3.6 - 11.0 K/uL   RBC 4.00 3.80 - 5.20 MIL/uL   Hemoglobin 12.0 12.0 - 16.0 g/dL   HCT 36.0 35.0 - 47.0 %   MCV 90.1 80.0 - 100.0 fL   MCH 30.1 26.0 - 34.0 pg   MCHC 33.4 32.0 - 36.0 g/dL   RDW 16.9 (H) 11.5 - 14.5 %   Platelets 216 150 - 440 K/uL   Neutrophils Relative % 86 %   Neutro Abs 10.0 (H) 1.4 - 6.5 K/uL   Lymphocytes Relative 6 %   Lymphs Abs 0.6 (L) 1.0 - 3.6 K/uL   Monocytes Relative 8 %   Monocytes Absolute 0.9 0.2 - 0.9 K/uL   Eosinophils Relative 1 %   Eosinophils Absolute 0.1 0 - 0.7 K/uL   Basophils Relative 1 %   Basophils Absolute 0.1 0 - 0.1 K/uL    Comment: Performed at [facility], [address] [place], [place] [address]  Procalcitonin     Status: None   Collection Time: 12/31/17  1:57 PM  Result Value Ref Range   Procalcitonin 0.14 ng/mL    Comment:        Interpretation: PCT (Procalcitonin) <= 0.5 ng/mL: Systemic infection (sepsis) is not likely. Local bacterial infection is possible. (NOTE)       Sepsis PCT Algorithm           Lower Respiratory Tract                                      Infection PCT Algorithm    ----------------------------     ----------------------------         PCT < 0.25 ng/mL                PCT < 0.10 ng/mL         Strongly encourage             Strongly discourage   discontinuation of antibiotics    initiation of antibiotics    ----------------------------     -----------------------------       PCT 0.25 - 0.50 ng/mL            PCT 0.10 - 0.25 ng/mL               OR       >80%  decrease in PCT            Discourage initiation of                                            antibiotics      Encourage discontinuation           of antibiotics    ----------------------------     -----------------------------         PCT >= 0.50 ng/mL              PCT 0.26 - 0.50 ng/mL               AND        <80% decrease in PCT             Encourage initiation of  antibiotics       Encourage continuation           of antibiotics    ----------------------------     -----------------------------        PCT >= 0.50 ng/mL                  PCT > 0.50 ng/mL               AND         increase in PCT                  Strongly encourage                                      initiation of antibiotics    Strongly encourage escalation           of antibiotics                                     -----------------------------                                           PCT <= 0.25 ng/mL                                                 OR                                        > 80% decrease in PCT                                     Discontinue / Do not initiate                                             antibiotics Performed at [facility], [address] [place], [place] [address]   Protime-INR     Status: Abnormal   Collection Time: 12/31/17  1:57 PM  Result Value Ref Range   Prothrombin Time 16.1 (H) 11.4 - 15.2 seconds   INR 1.30     Comment: Performed at [facility], [address], [place], [place] [address]  Blood Culture (routine x 2)     Status: None (Preliminary result)   Collection Time: 12/31/17  1:57 PM  Result Value Ref Range   Specimen Description      BLOOD 1 LEFT WRIST Performed at [facility], [address] [place], [place] [address]    Special Requests      BOTTLES DRAWN AEROBIC AND ANAEROBIC Performed at [facility], [address] [place], [place] [address]    Culture   Setup Time      GRAM POSITIVE COCCI AEROBIC BOTTLE ONLY CRITICAL RESULT CALLED TO, READ BACK BY AND VERIFIED WITH:  DAVID BESANTI AT 0603 01/01/18 SDR Performed at [facility], [place] [address] [place], [place] [address]    Culture GRAM POSITIVE COCCI    Report Status PENDING   Blood Culture (routine x 2)     Status: None (Preliminary result)   Collection Time: 12/31/17  1:57 PM  Result Value Ref Range   Specimen Description BLOOD 2 C FA    Special Requests BOTTLES DRAWN AEROBIC AND ANAEROBIC    Culture      NO GROWTH < 24 HOURS Performed at [facility], [address], [place], [place] [address]    Report Status PENDING   Urinalysis, Routine w reflex microscopic     Status: Abnormal   Collection Time: 12/31/17  1:57 PM  Result Value Ref Range   Color, Urine AMBER (A) YELLOW    Comment: BIOCHEMICALS MAY BE AFFECTED BY COLOR   APPearance CLOUDY (A) CLEAR   Specific Gravity, Urine 1.019 1.005 - 1.030   pH 5.0 5.0 - 8.0   Glucose, UA NEGATIVE NEGATIVE mg/dL   Hgb urine dipstick LARGE (A) NEGATIVE   Bilirubin Urine NEGATIVE NEGATIVE   Ketones, ur NEGATIVE NEGATIVE mg/dL   Protein, ur 100 (A) NEGATIVE mg/dL   Nitrite NEGATIVE NEGATIVE   Leukocytes, UA LARGE (A) NEGATIVE   RBC / HPF >50 (H) 0 - 5 RBC/hpf   WBC, UA >50 (H) 0 - 5 WBC/hpf   Bacteria, UA RARE (A) NONE SEEN   Squamous Epithelial / LPF 0-5 0 - 5    Comment: Please note change in reference range.   Mucus PRESENT    Amorphous Crystal PRESENT     Comment: Performed at [facility], [address] [place], [place] [address]  Blood Culture ID Panel (Reflexed)     Status: Abnormal   Collection Time: 12/31/17  1:57 PM  Result Value Ref Range   Enterococcus species NOT DETECTED NOT DETECTED   Listeria monocytogenes NOT DETECTED NOT DETECTED   Staphylococcus species DETECTED (A) NOT DETECTED    Comment: CRITICAL RESULT CALLED TO, READ BACK BY AND VERIFIED WITH:  DAVID BESANTI AT 0603 01/01/18 SDR     Staphylococcus aureus DETECTED (A) NOT DETECTED    Comment: Methicillin (oxacillin) susceptible Staphylococcus aureus (MSSA). Preferred therapy is anti staphylococcal beta lactam antibiotic (Cefazolin or Nafcillin), unless clinically contraindicated. CRITICAL RESULT CALLED TO, READ BACK BY AND VERIFIED WITH: [place] 01/01/18 SDR    Methicillin resistance NOT DETECTED NOT DETECTED   Streptococcus species NOT DETECTED NOT DETECTED   Streptococcus agalactiae NOT DETECTED NOT DETECTED   Streptococcus pneumoniae NOT DETECTED NOT DETECTED   Streptococcus pyogenes NOT DETECTED NOT DETECTED   Acinetobacter baumannii NOT DETECTED NOT DETECTED   Enterobacteriaceae species NOT DETECTED NOT DETECTED   Enterobacter cloacae complex NOT DETECTED NOT DETECTED   Escherichia coli NOT DETECTED NOT DETECTED   Klebsiella oxytoca NOT DETECTED NOT DETECTED   Klebsiella pneumoniae NOT DETECTED NOT DETECTED   Proteus species NOT DETECTED NOT DETECTED   Serratia marcescens NOT DETECTED NOT DETECTED   Haemophilus influenzae NOT DETECTED NOT DETECTED   Neisseria meningitidis NOT DETECTED NOT DETECTED   Pseudomonas aeruginosa NOT DETECTED NOT DETECTED   Candida albicans NOT DETECTED NOT DETECTED   Candida glabrata NOT DETECTED NOT DETECTED   Candida krusei NOT DETECTED NOT DETECTED   Candida parapsilosis NOT DETECTED NOT DETECTED   Candida tropicalis NOT DETECTED NOT DETECTED    Comment: Performed at [facility], [address] [place], [place] [address]  Lactic acid, plasma     Status: None   Collection Time: 12/31/17  5:03 PM  Result Value Ref Range   Lactic Acid, Venous 0.7 0.5 - 1.9 mmol/L    Comment: Performed at [facility], [address] [place], [place] [address]  Procalcitonin - Baseline     Status: None   Collection Time: 12/31/17  5:03 PM  Result Value Ref Range   Procalcitonin 0.12 ng/mL    Comment:        Interpretation: PCT (Procalcitonin) <= 0.5  ng/mL: Systemic infection (sepsis) is not likely. Local bacterial infection is possible. (NOTE)       Sepsis PCT Algorithm           Lower Respiratory Tract                                      Infection PCT Algorithm    ----------------------------     ----------------------------         PCT < 0.25 ng/mL                PCT < 0.10 ng/mL         Strongly encourage             Strongly discourage   discontinuation of antibiotics    initiation of antibiotics    ----------------------------     -----------------------------       PCT 0.25 - 0.50 ng/mL            PCT 0.10 - 0.25 ng/mL               OR       >80% decrease in PCT            Discourage initiation of                                            antibiotics      Encourage discontinuation           of antibiotics    ----------------------------     -----------------------------         PCT >= 0.50 ng/mL              PCT 0.26 - 0.50 ng/mL               AND        <80% decrease in PCT             Encourage initiation of                                             antibiotics       Encourage continuation           of antibiotics    ----------------------------     -----------------------------        PCT >= 0.50 ng/mL                  PCT > 0.50 ng/mL               AND         increase in PCT  Strongly encourage                                      initiation of antibiotics    Strongly encourage escalation           of antibiotics                                     -----------------------------                                           PCT <= 0.25 ng/mL                                                 OR                                        > 80% decrease in PCT                                     Discontinue / Do not initiate                                             antibiotics Performed at [facility], [address] [place], [place] [address]   MRSA PCR Screening     Status: None    Collection Time: 12/31/17  6:06 PM  Result Value Ref Range   MRSA by PCR NEGATIVE NEGATIVE    Comment:        The GeneXpert MRSA Assay (FDA approved for NASAL specimens only), is one component of a comprehensive MRSA colonization surveillance program. It is not intended to diagnose MRSA infection nor to guide or monitor treatment for MRSA infections. Performed at [facility], [address] [place], [place] [address]   Basic metabolic panel     Status: Abnormal   Collection Time: 01/01/18  7:35 AM  Result Value Ref Range   Sodium 137 135 - 145 mmol/L   Potassium 4.0 3.5 - 5.1 mmol/L   Chloride 108 101 - 111 mmol/L   CO2 19 (L) 22 - 32 mmol/L   Glucose, Bld 157 (H) 65 - 99 mg/dL   BUN 25 (H) 6 - 20 mg/dL   Creatinine, Ser 1.75 (H) 0.44 - 1.00 mg/dL   Calcium 7.9 (L) 8.9 - 10.3 mg/dL   GFR calc non Af Amer 28 (L) >60 mL/min   GFR calc Af Amer 32 (L) >60 mL/min    Comment: (NOTE) The eGFR has been calculated using the CKD EPI equation. This calculation has not been validated in all clinical situations. eGFR's persistently <60 mL/min signify possible Chronic Kidney Disease.    Anion gap 10 5 - 15    Comment: Performed at [facility], [place], [place]  [address]  CBC     Status: Abnormal   Collection Time: 01/01/18  7:35 AM  Result Value Ref Range   WBC 7.1 3.6 - 11.0 K/uL   RBC 3.74 (L) 3.80 - 5.20 MIL/uL   Hemoglobin 11.3 (L) 12.0 - 16.0 g/dL   HCT 33.9 (L) 35.0 - 47.0 %   MCV 90.7 80.0 - 100.0 fL   MCH 30.2 26.0 - 34.0 pg   MCHC 33.3 32.0 - 36.0 g/dL   RDW 16.6 (H) 11.5 - 14.5 %   Platelets 205 150 - 440 K/uL    Comment: Performed at [facility], [address] [place], [place] [address]  Procalcitonin     Status: None   Collection Time: 01/01/18  7:35 AM  Result Value Ref Range   Procalcitonin 0.18 ng/mL    Comment:        Interpretation: PCT (Procalcitonin) <= 0.5 ng/mL: Systemic infection (sepsis) is not  likely. Local bacterial infection is possible. (NOTE)       Sepsis PCT Algorithm           Lower Respiratory Tract                                      Infection PCT Algorithm    ----------------------------     ----------------------------         PCT < 0.25 ng/mL                PCT < 0.10 ng/mL         Strongly encourage             Strongly discourage   discontinuation of antibiotics    initiation of antibiotics    ----------------------------     -----------------------------       PCT 0.25 - 0.50 ng/mL            PCT 0.10 - 0.25 ng/mL               OR       >80% decrease in PCT            Discourage initiation of                                            antibiotics      Encourage discontinuation           of antibiotics    ----------------------------     -----------------------------         PCT >= 0.50 ng/mL              PCT 0.26 - 0.50 ng/mL               AND        <80% decrease in PCT             Encourage initiation of                                             antibiotics       Encourage continuation           of antibiotics    ----------------------------     -----------------------------  PCT >= 0.50 ng/mL                  PCT > 0.50 ng/mL               AND         increase in PCT                  Strongly encourage                                      initiation of antibiotics    Strongly encourage escalation           of antibiotics                                     -----------------------------                                           PCT <= 0.25 ng/mL                                                 OR                                        > 80% decrease in PCT                                     Discontinue / Do not initiate                                             antibiotics Performed at [facility], [address] [place], [place] [address]    No components found for: ESR, C REACTIVE PROTEIN MICRO: Recent Results (from the  past 720 hour(s))  Urine Culture     Status: None   Collection Time: 12/14/17 10:57 AM  Result Value Ref Range Status   Specimen Description   Final    URINE, RANDOM Performed at [facility], [address] [place], [place] [address]    Special Requests   Final    NONE Performed at [facility], [address], [place], [place] [address]    Culture   Final    NO GROWTH Performed at [facility], [place] [address] [place], [place] [address]    Report Status 12/15/2017 FINAL  Final  Blood Culture (routine x 2)     Status: None (Preliminary result)   Collection Time: 12/31/17  1:57 PM  Result Value Ref Range Status   Specimen Description   Final    BLOOD 1 LEFT WRIST Performed at [facility], [address], [place],  [address]    Special Requests   Final    BOTTLES DRAWN AEROBIC AND ANAEROBIC Performed at [facility], [place], [place] [address]    Culture  Setup Time   Final    GRAM POSITIVE COCCI AEROBIC BOTTLE ONLY CRITICAL RESULT CALLED TO, READ BACK BY AND VERIFIED WITH: [place] 01/01/18 SDR Performed at [facility], [place] [address] [place], [place] [address]    Culture GRAM POSITIVE COCCI  Final   Report Status PENDING  Incomplete  Blood Culture (routine x 2)     Status: None (Preliminary result)   Collection Time: 12/31/17  1:57 PM  Result Value Ref Range Status   Specimen Description BLOOD 2 C FA  Final   Special Requests BOTTLES DRAWN AEROBIC AND ANAEROBIC  Final   Culture   Final    NO GROWTH < 24 HOURS Performed at [facility], [address], [place], [place] [address]    Report Status PENDING  Incomplete  Blood Culture ID Panel (Reflexed)     Status: Abnormal   Collection Time: 12/31/17  1:57 PM  Result Value Ref Range Status   Enterococcus species NOT DETECTED NOT DETECTED Final   Listeria monocytogenes NOT DETECTED NOT DETECTED Final   Staphylococcus  species DETECTED (A) NOT DETECTED Final    Comment: CRITICAL RESULT CALLED TO, READ BACK BY AND VERIFIED WITH:  DAVID BESANTI AT 0603 01/01/18 SDR    Staphylococcus aureus DETECTED (A) NOT DETECTED Final    Comment: Methicillin (oxacillin) susceptible Staphylococcus aureus (MSSA). Preferred therapy is anti staphylococcal beta lactam antibiotic (Cefazolin or Nafcillin), unless clinically contraindicated. CRITICAL RESULT CALLED TO, READ BACK BY AND VERIFIED WITH: [place] 01/01/18 SDR    Methicillin resistance NOT DETECTED NOT DETECTED Final   Streptococcus species NOT DETECTED NOT DETECTED Final   Streptococcus agalactiae NOT DETECTED NOT DETECTED Final   Streptococcus pneumoniae NOT DETECTED NOT DETECTED Final   Streptococcus pyogenes NOT DETECTED NOT DETECTED Final   Acinetobacter baumannii NOT DETECTED NOT DETECTED Final   Enterobacteriaceae species NOT DETECTED NOT DETECTED Final   Enterobacter cloacae complex NOT DETECTED NOT DETECTED Final   Escherichia coli NOT DETECTED NOT DETECTED Final   Klebsiella oxytoca NOT DETECTED NOT DETECTED Final   Klebsiella pneumoniae NOT DETECTED NOT DETECTED Final   Proteus species NOT DETECTED NOT DETECTED Final   Serratia marcescens NOT DETECTED NOT DETECTED Final   Haemophilus influenzae NOT DETECTED NOT DETECTED Final   Neisseria meningitidis NOT DETECTED NOT DETECTED Final   Pseudomonas aeruginosa NOT DETECTED NOT DETECTED Final   Candida albicans NOT DETECTED NOT DETECTED Final   Candida glabrata NOT DETECTED NOT DETECTED Final   Candida krusei NOT DETECTED NOT DETECTED Final   Candida parapsilosis NOT DETECTED NOT DETECTED Final   Candida tropicalis NOT DETECTED NOT DETECTED Final    Comment: Performed at [facility], [address] [place], [place] [address]  MRSA PCR Screening     Status: None   Collection Time: 12/31/17  6:06 PM  Result Value Ref Range Status   MRSA by PCR NEGATIVE NEGATIVE Final    Comment:         The GeneXpert MRSA Assay (FDA approved for NASAL specimens only), is one component of a comprehensive MRSA colonization surveillance program. It is not intended to diagnose MRSA infection nor to guide or monitor treatment for MRSA infections. Performed at [facility], [address] [place], [place] [address]     IMAGING: Ct Head Wo Contrast  Result Date: 12/13/2017 CLINICAL DATA:  Syncopal episode.  Decreased  responsiveness. EXAM: CT HEAD WITHOUT CONTRAST TECHNIQUE: Contiguous axial images were obtained from the base of the skull through the vertex without intravenous contrast. COMPARISON:  MR head 10/29/2017.  CT head 10/29/2017. FINDINGS: Brain: No evidence for acute infarction, hemorrhage, mass lesion, hydrocephalus, or extra-axial fluid. Generalized atrophy. Extensive white matter hypoattenuation favored to represent small vessel disease. Vascular: Calcification of the cavernous internal carotid arteries consistent with cerebrovascular atherosclerotic disease. No signs of intracranial large vessel occlusion. Skull: Normal. Negative for fracture or focal lesion. Sinuses/Orbits: No acute finding. Other: Compared with prior CT and MR, the acute infarcts of the RIGHT internal and external capsule, and LEFT external capsule, demonstrated previously are observed as areas of hypoattenuation on today's examination. IMPRESSION: Atrophy and small vessel disease. Sequelae of recent lacunar infarcts, now subacute to chronic. No acute intracranial findings are evident. Electronically Signed   By: Staci Righter M.D.   On: 12/13/2017 14:55   Mr Angiogram Head Wo Contrast  Result Date: 12/13/2017 CLINICAL DATA:  Syncopal episode today. EXAM: MRI HEAD WITHOUT CONTRAST MRA HEAD WITHOUT CONTRAST TECHNIQUE: Multiplanar, multiecho pulse sequences of the brain and surrounding structures were obtained without intravenous contrast. Angiographic images of the head were obtained using MRA technique  without contrast. COMPARISON:  Head CT 12/13/2017.  Head MRI/MRA 10/29/2017. FINDINGS: MRI HEAD FINDINGS Brain: There is a 1.7 cm acute infarct in the posteromedial left occipital lobe. An acute curvilinear infarct is noted in the left parietal lobe extending from the periventricular to the subcortical white matter. There are also acute subcentimeter periventricular white matter infarcts in the right temporal lobe. A 2 mm focus of mild trace diffusion hyperintensity in the left corona radiata is new from the prior MRI but is without clearly reduced ADC to confirm an acute infarct though evaluation is limited by its small size. The small acute infarcts on the prior MRI are now largely late subacute in appearance with some early developing encephalomalacia. There is mild residual trace diffusion signal abnormality in the left centrum semiovale and anterior right external capsule corresponding to old infarcts which were present on the prior MRI. No intracranial hemorrhage, mass, midline shift, or extra-axial fluid collection is identified. Chronic lacunar infarcts are noted in the right corona radiata, left cerebellum, and left paramedian pons. Patchy T2 hyperintensities in the cerebral white matter and pons are nonspecific but compatible with moderately extensive chronic small vessel ischemic disease, similar to the prior MRI. There is mild cerebral atrophy. Vascular: Major intracranial vascular flow voids are preserved. Skull and upper cervical spine: Unremarkable bone marrow signal. Sinuses/Orbits: Unremarkable orbits. Trace left mastoid fluid. Minimal paranasal sinus mucosal thickening. Other: None. MRA HEAD FINDINGS The study is moderately motion degraded. There is near complete signal loss in both V4 segments which may be artifactual given the symmetry as well as preserved flow related enhancement in both distal V3 segments, however reduced flow from underlying stenoses is not excluded. Signal loss extends to the  vertebrobasilar junction, partially obscuring the proximal most basilar artery. The remainder of the basilar artery is widely patent. There is mild fullness of the basilar tip which is favored to reflect the confluence of the PCA and SCA origins, without a saccular aneurysm identified. Posterior communicating arteries are present bilaterally, right larger than left. The right P1 segment is likely very hypoplastic, and there is milder hypoplasia of the left P1 segment. The P2 segments are grossly patent, however motion limits detailed assessment. The distal right cervical ICA is tortuous. The intracranial ICAs are  widely patent. M1 segments and MCA bifurcations are grossly patent, however motion artifact results in signal loss particularly notable in the right greater than left proximal M2 branches which limits assessment. The ACAs are patent with motion artifact through the A1 and proximal A2 segments limiting evaluation for stenosis. No large aneurysm is identified. IMPRESSION: 1. Small acute infarcts in the left occipital lobe and white matter of the left parietal and right temporal lobes. 2. Multiple late subacute and chronic infarcts as above. 3. Extensive chronic small vessel ischemic disease. 4. Motion degraded head MRA without large vessel occlusion in the anterior circulation. Signal loss throughout both V4 segments through the vertebrobasilar junction is favored to be artifactual but results in nondiagnostic assessment of these vessels. Electronically Signed   By: Logan Bores M.D.   On: 12/13/2017 17:26   Mr Brain Wo Contrast  Result Date: 12/13/2017 CLINICAL DATA:  Syncopal episode today. EXAM: MRI HEAD WITHOUT CONTRAST MRA HEAD WITHOUT CONTRAST TECHNIQUE: Multiplanar, multiecho pulse sequences of the brain and surrounding structures were obtained without intravenous contrast. Angiographic images of the head were obtained using MRA technique without contrast. COMPARISON:  Head CT 12/13/2017.  Head  MRI/MRA 10/29/2017. FINDINGS: MRI HEAD FINDINGS Brain: There is a 1.7 cm acute infarct in the posteromedial left occipital lobe. An acute curvilinear infarct is noted in the left parietal lobe extending from the periventricular to the subcortical white matter. There are also acute subcentimeter periventricular white matter infarcts in the right temporal lobe. A 2 mm focus of mild trace diffusion hyperintensity in the left corona radiata is new from the prior MRI but is without clearly reduced ADC to confirm an acute infarct though evaluation is limited by its small size. The small acute infarcts on the prior MRI are now largely late subacute in appearance with some early developing encephalomalacia. There is mild residual trace diffusion signal abnormality in the left centrum semiovale and anterior right external capsule corresponding to old infarcts which were present on the prior MRI. No intracranial hemorrhage, mass, midline shift, or extra-axial fluid collection is identified. Chronic lacunar infarcts are noted in the right corona radiata, left cerebellum, and left paramedian pons. Patchy T2 hyperintensities in the cerebral white matter and pons are nonspecific but compatible with moderately extensive chronic small vessel ischemic disease, similar to the prior MRI. There is mild cerebral atrophy. Vascular: Major intracranial vascular flow voids are preserved. Skull and upper cervical spine: Unremarkable bone marrow signal. Sinuses/Orbits: Unremarkable orbits. Trace left mastoid fluid. Minimal paranasal sinus mucosal thickening. Other: None. MRA HEAD FINDINGS The study is moderately motion degraded. There is near complete signal loss in both V4 segments which may be artifactual given the symmetry as well as preserved flow related enhancement in both distal V3 segments, however reduced flow from underlying stenoses is not excluded. Signal loss extends to the vertebrobasilar junction, partially obscuring the  proximal most basilar artery. The remainder of the basilar artery is widely patent. There is mild fullness of the basilar tip which is favored to reflect the confluence of the PCA and SCA origins, without a saccular aneurysm identified. Posterior communicating arteries are present bilaterally, right larger than left. The right P1 segment is likely very hypoplastic, and there is milder hypoplasia of the left P1 segment. The P2 segments are grossly patent, however motion limits detailed assessment. The distal right cervical ICA is tortuous. The intracranial ICAs are widely patent. M1 segments and MCA bifurcations are grossly patent, however motion artifact results in signal loss particularly  notable in the right greater than left proximal M2 branches which limits assessment. The ACAs are patent with motion artifact through the A1 and proximal A2 segments limiting evaluation for stenosis. No large aneurysm is identified. IMPRESSION: 1. Small acute infarcts in the left occipital lobe and white matter of the left parietal and right temporal lobes. 2. Multiple late subacute and chronic infarcts as above. 3. Extensive chronic small vessel ischemic disease. 4. Motion degraded head MRA without large vessel occlusion in the anterior circulation. Signal loss throughout both V4 segments through the vertebrobasilar junction is favored to be artifactual but results in nondiagnostic assessment of these vessels. Electronically Signed   By: Logan Bores M.D.   On: 12/13/2017 17:26   Dg Chest Port 1 View  Result Date: 12/31/2017 CLINICAL DATA:  72 year old female with a history confusion EXAM: PORTABLE CHEST 1 VIEW COMPARISON:  10/29/2017, 03/02/2007 FINDINGS: New airspace opacity at the right lower lung. Cardiomediastinal silhouette unchanged in size and contour. No pneumothorax.  No pleural effusion. Degenerative changes of the shoulders.  No acute displaced fracture IMPRESSION: New airspace opacity at the right lung base  suggesting lobar pneumonia. Followup PA and lateral chest X-ray is recommended in 3-4 weeks following trial of therapy to assure resolution. Electronically Signed   By: Corrie Mckusick D.O.   On: 12/31/2017 14:23    Assessment:   DEBBORAH ALONGE is a 72 y.o. female admitted with SOB and fevers. Found to have R sided PNA. MSSA bacteremia as well. 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  She needs evaluation for possible endocarditis.  This could explain her multifocal showering of emboli causing her stroke last month.  She has had multiple admissions multiple since March 4th.  Recommendations Repeat bcx - ordered TTE ordered - will likely need TEE if TTE negative- I discussed this with the patient- could plan for Thursday morning if TTE is negative.  Change to ancef . Once bcx neg x 48 hours can place PICC - likely Thursday afternoon Will need min 2 weeks IV ancef if TEE negative.  Will need HH at dc Thank you very much for allowing me to participate in the care of this patient. Please call with questions.   Cheral Marker. Ola Spurr, MD

## 2018-01-02 LAB — BASIC METABOLIC PANEL
ANION GAP: 7 (ref 5–15)
BUN: 29 mg/dL — ABNORMAL HIGH (ref 6–20)
CALCIUM: 8.1 mg/dL — AB (ref 8.9–10.3)
CO2: 20 mmol/L — ABNORMAL LOW (ref 22–32)
Chloride: 112 mmol/L — ABNORMAL HIGH (ref 101–111)
Creatinine, Ser: 1.68 mg/dL — ABNORMAL HIGH (ref 0.44–1.00)
GFR, EST AFRICAN AMERICAN: 34 mL/min — AB (ref >60)
GFR, EST NON AFRICAN AMERICAN: 29 mL/min — AB (ref >60)
GLUCOSE: 150 mg/dL — AB (ref 65–99)
Potassium: 4 mmol/L (ref 3.5–5.1)
Sodium: 139 mmol/L (ref 135–145)

## 2018-01-02 LAB — ECHOCARDIOGRAM COMPLETE
Height: 64 in
Weight: 2128 oz

## 2018-01-02 LAB — PROCALCITONIN: Procalcitonin: 0.13 ng/mL

## 2018-01-02 MED ORDER — METHYLPREDNISOLONE SODIUM SUCC 40 MG IJ SOLR
40.0000 mg | INTRAMUSCULAR | Status: DC
  Administered 2018-01-02: 18:00:00 40 mg via INTRAVENOUS
  Filled 2018-01-02: qty 1

## 2018-01-02 MED ORDER — AMLODIPINE BESYLATE 5 MG PO TABS
5.0000 mg | ORAL_TABLET | Freq: Every day | ORAL | Status: DC
  Administered 2018-01-02 – 2018-01-03 (×2): 5 mg via ORAL
  Filled 2018-01-02 (×2): qty 1

## 2018-01-02 NOTE — Progress Notes (Signed)
[facility] CLINIC INFECTIOUS DISEASE PROGRESS NOTE Date of Admission:  12/31/2017     ID: Destiny Mack is a 72 y.o. female with MSSA bactremia, PNA Active Problems:   Pneumonia   Malnutrition of moderate degree   Subjective: No fevers, fu bcx done. Echo Pending  ROS  Eleven systems are reviewed and negative except per hpi  Medications:  Antibiotics Given (last 72 hours)    Date/Time Action Medication Dose Rate   12/31/17 1410 New Bag/Given   cefTRIAXone (ROCEPHIN) 1 g in sodium chloride 0.9 % 100 mL IVPB 1 g 200 mL/hr   12/31/17 1845 New Bag/Given   ceFEPIme (MAXIPIME) 2 g in sodium chloride 0.9 % 100 mL IVPB 2 g 200 mL/hr   12/31/17 1858 New Bag/Given   vancomycin (VANCOCIN) IVPB 1000 mg/200 mL premix 1,000 mg 200 mL/hr   01/01/18 1730 New Bag/Given   ceFAZolin (ANCEF) IVPB 2g/100 mL premix 2 g 200 mL/hr   01/02/18 0528 New Bag/Given   ceFAZolin (ANCEF) IVPB 2g/100 mL premix 2 g 200 mL/hr     . amLODipine  5 mg Oral Daily  . apixaban  5 mg Oral BID  . atorvastatin  20 mg Oral QHS  . feeding supplement (ENSURE ENLIVE)  237 mL Oral BID BM  . ipratropium-albuterol  3 mL Nebulization TID  . methylPREDNISolone (SOLU-MEDROL) injection  40 mg Intravenous Q24H  . multivitamin with minerals  1 tablet Oral Daily    Objective: Vital signs in last 24 hours: Temp:  [97.8 F (36.6 C)-98.4 F (36.9 C)] 97.8 F (36.6 C) (05/08 1415) Pulse Rate:  [78-85] 78 (05/08 1415) Resp:  [18-20] 20 (05/08 1415) BP: (154-168)/(57-75) 167/75 (05/08 1415) SpO2:  [95 %-98 %] 98 % (05/08 1415) Weight:  [60.7 kg (133 lb 14.4 oz)] 60.7 kg (133 lb 14.4 oz) (05/08 0505) Constitutional:  oriented to person, place, and time. appears well-developed and well-nourished. Disheveled, tremors HENT: [place]/AT, PERRLA, no scleral icterus Mouth/Throat: Oropharynx is clear and moist. No oropharyngeal exudate.  Cardiovascular: 2/6 sm Pulmonary/Chest:poor air movement, rhonchi bil bases Neck = supple, no nuchal  rigidity Abdominal: Soft. Bowel sounds are normal.  exhibits no distension. There is no tenderness.  Lymphadenopathy: no cervical adenopathy. No axillary adenopathy Neurological: alert and oriented to person, place, and time.  Skin: Skin is warm and dry. Multiple scratches and scabs. Psychiatric: a normal mood and affect.  behavior is normal.  Ext no cce.    Lab Results Recent Labs    12/31/17 1357 01/01/18 0735 01/02/18 0540  WBC 11.6* 7.1  --   HGB 12.0 11.3*  --   HCT 36.0 33.9*  --   NA 135 137 139  K 3.9 4.0 4.0  CL 104 108 112*  CO2 20* 19* 20*  BUN 23* 25* 29*  CREATININE 1.90* 1.75* 1.68*    Microbiology: Results for orders placed or performed during the hospital encounter of 12/31/17  Blood Culture (routine x 2)     Status: Abnormal (Preliminary result)   Collection Time: 12/31/17  1:57 PM  Result Value Ref Range Status   Specimen Description   Final    BLOOD 1 LEFT WRIST Performed at [facility], [address], [place], [place] [address]    Special Requests   Final    BOTTLES DRAWN AEROBIC AND ANAEROBIC Performed at [facility], [address] [place], [place] [address]    Culture  Setup Time   Final    GRAM POSITIVE COCCI AEROBIC BOTTLE  ONLY CRITICAL RESULT CALLED TO, READ BACK BY AND VERIFIED WITH: Cash Duce BESANTI AT 0603 01/01/18 SDR    Culture (A)  Final    STAPHYLOCOCCUS AUREUS SUSCEPTIBILITIES TO FOLLOW Performed at [facility], [address], [place], [place] [address]    Report Status PENDING  Incomplete  Blood Culture (routine x 2)     Status: None (Preliminary result)   Collection Time: 12/31/17  1:57 PM  Result Value Ref Range Status   Specimen Description BLOOD 2 C FA  Final   Special Requests BOTTLES DRAWN AEROBIC AND ANAEROBIC  Final   Culture   Final    NO GROWTH 2 DAYS Performed at [facility], [address] [place], [place] [address]    Report Status PENDING  Incomplete  Urine culture      Status: None   Collection Time: 12/31/17  1:57 PM  Result Value Ref Range Status   Specimen Description   Final    URINE, RANDOM Performed at [facility], [address], [place], [place] [address]    Special Requests   Final    NONE Performed at [facility], [address], [place], [place] [address]    Culture   Final    NO GROWTH Performed at [facility], [address] [place], [place] [address]    Report Status 01/01/2018 FINAL  Final  Blood Culture ID Panel (Reflexed)     Status: Abnormal   Collection Time: 12/31/17  1:57 PM  Result Value Ref Range Status   Enterococcus species NOT DETECTED NOT DETECTED Final   Listeria monocytogenes NOT DETECTED NOT DETECTED Final   Staphylococcus species DETECTED (A) NOT DETECTED Final    Comment: CRITICAL RESULT CALLED TO, READ BACK BY AND VERIFIED WITH:  Julya Alioto BESANTI AT 0603 01/01/18 SDR    Staphylococcus aureus DETECTED (A) NOT DETECTED Final    Comment: Methicillin (oxacillin) susceptible Staphylococcus aureus (MSSA). Preferred therapy is anti staphylococcal beta lactam antibiotic (Cefazolin or Nafcillin), unless clinically contraindicated. CRITICAL RESULT CALLED TO, READ BACK BY AND VERIFIED WITH: Keiona Jenison BESANTI AT 0603 01/01/18 SDR    Methicillin resistance NOT DETECTED NOT DETECTED Final   Streptococcus species NOT DETECTED NOT DETECTED Final   Streptococcus agalactiae NOT DETECTED NOT DETECTED Final   Streptococcus pneumoniae NOT DETECTED NOT DETECTED Final   Streptococcus pyogenes NOT DETECTED NOT DETECTED Final   Acinetobacter baumannii NOT DETECTED NOT DETECTED Final   Enterobacteriaceae species NOT DETECTED NOT DETECTED Final   Enterobacter cloacae complex NOT DETECTED NOT DETECTED Final   Escherichia coli NOT DETECTED NOT DETECTED Final   Klebsiella oxytoca NOT DETECTED NOT DETECTED Final   Klebsiella pneumoniae NOT DETECTED NOT DETECTED Final   Proteus species NOT DETECTED NOT DETECTED Final    Serratia marcescens NOT DETECTED NOT DETECTED Final   Haemophilus influenzae NOT DETECTED NOT DETECTED Final   Neisseria meningitidis NOT DETECTED NOT DETECTED Final   Pseudomonas aeruginosa NOT DETECTED NOT DETECTED Final   Candida albicans NOT DETECTED NOT DETECTED Final   Candida glabrata NOT DETECTED NOT DETECTED Final   Candida krusei NOT DETECTED NOT DETECTED Final   Candida parapsilosis NOT DETECTED NOT DETECTED Final   Candida tropicalis NOT DETECTED NOT DETECTED Final    Comment: Performed at [facility], [address] [place], [place] [address]  MRSA PCR Screening     Status: None   Collection Time: 12/31/17  6:06 PM  Result Value Ref Range Status   MRSA  by PCR NEGATIVE NEGATIVE Final    Comment:        The GeneXpert MRSA Assay (FDA approved for NASAL specimens only), is one component of a comprehensive MRSA colonization surveillance program. It is not intended to diagnose MRSA infection nor to guide or monitor treatment for MRSA infections. Performed at [facility], [address] [place], [place] [address]   Culture, blood (single) w Reflex to ID Panel     Status: None (Preliminary result)   Collection Time: 01/01/18  2:59 PM  Result Value Ref Range Status   Specimen Description BLOOD BLOOD RIGHT WRIST  Final   Special Requests   Final    BOTTLES DRAWN AEROBIC AND ANAEROBIC Blood Culture adequate volume   Culture   Final    NO GROWTH < 24 HOURS Performed at [facility], [address] [place], [place] [address]    Report Status PENDING  Incomplete    Studies/Results: No results found.  Assessment/Plan: Destiny Mack is a 72 y.o. female admitted with SOB and fevers. Found to have R sided PNA. MSSA bacteremia as well. 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  She needs evaluation for possible endocarditis.  This could explain her multifocal showering of emboli causing her stroke last month.  She has had multiple admissions multiple  since March 4th. 5/8 - fu bcx pending, TTE pending  Recommendations TTE ordered - will likely need TEE if TTE negative- I discussed this with the patient- could plan for Thursday or Friday morning if TTE is negative.  Cont  ancef . Once bcx neg x 48 hours can place PICC - likely Thursday afternoon Will need min 2 weeks IV ancef if TEE negative.  Will need HH at dc Thank you very much for the consult. Will follow with you.  Mick Sell   01/02/2018, 3:22 PM

## 2018-01-02 NOTE — Plan of Care (Signed)
  Problem: Respiratory: Goal: Ability to maintain a clear airway will improve Outcome: Progressing   Problem: Education: Goal: Knowledge of General Education information will improve Outcome: Progressing   Problem: Elimination: Goal: Will not experience complications related to bowel motility Outcome: Progressing   Problem: Safety: Goal: Ability to remain free from injury will improve Outcome: Progressing

## 2018-01-02 NOTE — Progress Notes (Signed)
[employer] [facility] - [place] [facility]   PATIENT NAME: Destiny Mack    MR#:  [id number]  DATE OF BIRTH:  1945-11-07  SUBJECTIVE:  seen at bedside, patient has lots of cough and phlegm.  CHIEF COMPLAINT:   Chief Complaint  Patient presents with  . Altered Mental Status  . Recurrent UTI    REVIEW OF SYSTEMS:  Review of Systems  Constitutional: Positive for chills. Negative for fever and malaise/fatigue.  HENT: Negative for sore throat.   Eyes: Negative for blurred vision and double vision.  Respiratory: Negative for cough, hemoptysis, shortness of breath, wheezing and stridor.   Cardiovascular: Negative for chest pain, palpitations, orthopnea and leg swelling.  Gastrointestinal: Negative for abdominal pain, blood in stool, diarrhea, melena, nausea and vomiting.  Genitourinary: Negative for dysuria, flank pain and hematuria.  Musculoskeletal: Negative for back pain and joint pain.  Skin: Negative for rash.  Neurological: Negative for dizziness, sensory change, focal weakness, seizures, loss of consciousness, weakness and headaches.  Endo/Heme/Allergies: Negative for polydipsia.  Psychiatric/Behavioral: Negative for depression. The patient is not nervous/anxious.     DRUG ALLERGIES:  No Known Allergies VITALS:  Blood pressure (!) 167/72, pulse 82, temperature 98.4 F (36.9 C), temperature source Oral, resp. rate 18, height  (1.626 m), weight 60.7 kg (133 lb 14.4 oz), SpO2 95 %. PHYSICAL EXAMINATION:  Physical Exam  Constitutional: She is oriented to person, place, and time.  HENT:  Head: Normocephalic.  Mouth/Throat: Oropharynx is clear and moist.  Eyes: Pupils are equal, round, and reactive to light. Conjunctivae and EOM are normal. No scleral icterus.  Neck: Normal range of motion. Neck supple. No JVD present. No tracheal deviation present.  Cardiovascular: Normal rate, regular rhythm and normal heart sounds. Exam reveals no gallop.  No murmur  heard. Pulmonary/Chest: Effort normal and breath sounds normal. No respiratory distress. She has no wheezes. She has no rales.  Abdominal: Soft. Bowel sounds are normal. She exhibits no distension. There is no tenderness. There is no rebound.  Musculoskeletal: Normal range of motion. She exhibits no edema or tenderness.  Neurological: She is alert and oriented to person, place, and time. No cranial nerve deficit.  Skin: No rash noted. No erythema.  Psychiatric: She has a normal mood and affect.   LABORATORY PANEL:  Female CBC Recent Labs  Lab 01/01/18 0735  WBC 7.1  HGB 11.3*  HCT 33.9*  PLT 205   ------------------------------------------------------------------------------------------------------------------ Chemistries  Recent Labs  Lab 12/31/17 1357  01/02/18 0540  NA 135   < > 139  K 3.9   < > 4.0  CL 104   < > 112*  CO2 20*   < > 20*  GLUCOSE 113*   < > 150*  BUN 23*   < > 29*  CREATININE 1.90*   < > 1.68*  CALCIUM 8.2*   < > 8.1*  AST 28  --   --   ALT 14  --   --   ALKPHOS 176*  --   --   BILITOT 1.0  --   --    < > = values in this interval not displayed.   RADIOLOGY:  No results found. ASSESSMENT AND PLAN:   *Right lower lobe healthcare acquired pneumonia and MSSA bacteremia. Sepsis present on admission.,  WBC normalized.on Ancef now, patient had echo done but results are pending to evaluate for his vegetation.  If echo is negative patient needs TEE. Will need min 2 weeks IV ancef  if TEE negative per Dr. Sampson Goon. Cultures out to be negative for 48 hours, pick needs to be placed likely tomorrow afternoon.  Rpt  blood cultures from 5/7/ have been negative for 24 hours. *Acute metabolic encephalopathy due to above, Improved.  ARF on CKD stage 3.  Discontinue IV fluids at this time.  Paroxysmal atrial fibrillation.  Patient is not on any rate control medications.  On Eliquis. History of embolic CVAs.  On Eliquis.  Moderate Malnutrition: Seen by dietitian,  continue Ensure Enlive Frequent falls, CVA, A. fib' get PT evaluation for disposition planning  All the records are reviewed and case discussed with Care Management/Social Worker. Management plans discussed with the patient, family and they are in agreement.  CODE STATUS: DNR  TOTAL TIME TAKING CARE OF THIS PATIENT: 36 minutes.   More than 50% of the time was spent in counseling/coordination of care: YES  POSSIBLE D/C IN 3 DAYS, DEPENDING ON CLINICAL CONDITION.   Katha Hamming M.D on 01/02/2018 at 1:55 PM  Between 7am to 6pm - Pager - [phone]  After 6pm go to [email or web address] - Therapist, nutritional Hospitalists

## 2018-01-03 ENCOUNTER — Inpatient Hospital Stay: Payer: Self-pay

## 2018-01-03 LAB — CULTURE, BLOOD (ROUTINE X 2)

## 2018-01-03 MED ORDER — IPRATROPIUM-ALBUTEROL 0.5-2.5 (3) MG/3ML IN SOLN: 3.0000 mL | Freq: Four times a day (QID) | RESPIRATORY_TRACT | Status: DC | PRN

## 2018-01-03 MED ORDER — SODIUM CHLORIDE 0.9% FLUSH
10.0000 mL | INTRAVENOUS | Status: DC | PRN
  Administered 2018-01-03 (×2): 10 mL
  Filled 2018-01-03 (×2): qty 40

## 2018-01-03 MED ORDER — SODIUM CHLORIDE 0.9% FLUSH
10.0000 mL | Freq: Two times a day (BID) | INTRAVENOUS | Status: DC
  Administered 2018-01-03 – 2018-01-04 (×2): 10 mL

## 2018-01-03 NOTE — Progress Notes (Signed)
ID E note No fevers. FU BCX 5/7 NGTD. TTE Negative   Impression Destiny Mack a  71y.o.femaleadmitted with SOB and fevers. Found to have R sided PNA. MSSA bacteremia as well. 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 She needs evaluation for possible endocarditis. This could explain her multifocal showering of emboli causing her stroke last month. She has had multipleadmissions multiple since March4th. 5/8 - fu bcx pending, TTE negative  Recommendations Needs TEE - cards cs ordered Cont  ancef. Can place PICC  Will need min 2 weeks IV ancef if TEE negative. Will need HH at dc

## 2018-01-03 NOTE — Care Management Important Message (Signed)
Important Message  Patient Details  Name: MAYZEE REICHENBACH MRN: [id number] Date of Birth: 1946-03-21   Medicare Important Message Given:  Yes    [name] A Chanteria Haggard 01/03/2018, 11:34 AM

## 2018-01-03 NOTE — Consult Note (Signed)
[facility] Cardiology Consultation Note  Patient ID: Destiny Mack, MRN: [id number], DOB/AGE: 12-02-1945 72 y.o. Admit date: 12/31/2017   Date of Consult: 01/03/2018 Primary Physician: Oswaldo Conroy, MD Primary Cardiologist: None  Chief Complaint:  Chief Complaint  Patient presents with  . Altered Mental Status  . Recurrent UTI   Reason for Consult: Atrial fibrillation previous stroke and possible endocarditis  HPI: 72 y.o. female with known essential hypertension mixed hyperlipidemia and paroxysmal nonvalvular atrial fibrillation on appropriate medication management including amlodipine for hypertension and Eliquis for previous stroke and paroxysmal atrial fibrillation as well as high intensity cholesterol therapy with atorvastatin.  The patient had mission to the hospital for acute mental status changes and now has improved after appropriate treatment of methicillin sensitive Staphylococcus aureus bacteremia.  The patient did have an echocardiogram showing normal LV systolic function and no evidence of endocarditis although there is concerns for potential source.  Therefore the patient may need further evaluation and treatment options including a transesophageal echocardiogram  Past Medical History:  Diagnosis Date  . A-fib (HCC)   . Depression   . Frequent falls   . Hypertension   . Leaking of urine   . Mixed incontinence   . Stroke (HCC)   . Tremor of hands and face       Surgical History:  Past Surgical History:  Procedure Laterality Date  . APPENDECTOMY    . TUBAL LIGATION       Home Meds: Prior to Admission medications   Medication Sig Start Date End Date Taking? Authorizing Provider  amLODipine (NORVASC) 5 MG tablet Take 5 mg by mouth daily.   Yes [provider]  apixaban (ELIQUIS) 5 MG TABS tablet Take 1 tablet (5 mg total) by mouth 2 (two) times daily. 12/15/17 01/14/18 Yes Pyreddy, Vivien Rota, MD  atorvastatin (LIPITOR) 20 MG tablet Take 20 mg by mouth  at bedtime.    Yes [provider]  lisinopril (PRINIVIL,ZESTRIL) 30 MG tablet Take 30 mg by mouth daily.   Yes [provider]  aspirin 325 MG tablet Take 1 tablet (325 mg total) by mouth daily. Patient not taking: Reported on 12/31/2017 12/16/17   Ihor Austin, MD    Inpatient Medications:  . amLODipine  5 mg Oral Daily  . apixaban  5 mg Oral BID  . atorvastatin  20 mg Oral QHS  . feeding supplement (ENSURE ENLIVE)  237 mL Oral BID BM  . multivitamin with minerals  1 tablet Oral Daily  . sodium chloride flush  10-40 mL Intracatheter Q12H   .  ceFAZolin (ANCEF) IV Stopped (01/03/18 0544)    Allergies: No Known Allergies  Social History   Socioeconomic History  . Marital status: Widowed    Spouse name: Not on file  . Number of children: Not on file  . Years of education: Not on file  . Highest education level: Not on file  Occupational History  . Not on file  Social Needs  . Financial resource strain: Not on file  . Food insecurity:    Worry: Not on file    Inability: Not on file  . Transportation needs:    Medical: Not on file    Non-medical: Not on file  Tobacco Use  . Smoking status: Current Every Day Smoker    Packs/day: 0.50    Years: 56.00    Pack years: 28.00    Last attempt to quit: 11/22/2017    Years since quitting: 0.1  . Smokeless  tobacco: Never Used  Substance and Sexual Activity  . Alcohol use: No    Frequency: Never  . Drug use: No  . Sexual activity: Not Currently  Lifestyle  . Physical activity:    Days per week: Not on file    Minutes per session: Not on file  . Stress: Not on file  Relationships  . Social connections:    Talks on phone: Not on file    Gets together: Not on file    Attends religious service: Not on file    Active member of club or organization: Not on file    Attends meetings of clubs or organizations: Not on file    Relationship status: Not on file  . Intimate partner violence:    Fear of current or ex  partner: Not on file    Emotionally abused: Not on file    Physically abused: Not on file    Forced sexual activity: Not on file  Other Topics Concern  . Not on file  Social History Narrative  . Not on file     Family History  Problem Relation Age of Onset  . Dementia Mother   . Heart failure Mother   . Heart failure Father   . CAD Father      Review of Systems Positive for none Negative for: General:  chills, fever, night sweats or weight changes.  Cardiovascular: PND orthopnea syncope dizziness  Dermatological skin lesions rashes Respiratory: Cough congestion Urologic: Frequent urination urination at night and hematuria Abdominal: negative for nausea, vomiting, diarrhea, bright red blood per rectum, melena, or hematemesis Neurologic: negative for visual changes, and/or hearing changes  All other systems reviewed and are otherwise negative except as noted above.  Labs: No results for input(s): CKTOTAL, CKMB, TROPONINI in the last 72 hours. Lab Results  Component Value Date   WBC 7.1 01/01/2018   HGB 11.3 (L) 01/01/2018   HCT 33.9 (L) 01/01/2018   MCV 90.7 01/01/2018   PLT 205 01/01/2018    Recent Labs  Lab 12/31/17 1357  01/02/18 0540  NA 135   < > 139  K 3.9   < > 4.0  CL 104   < > 112*  CO2 20*   < > 20*  BUN 23*   < > 29*  CREATININE 1.90*   < > 1.68*  CALCIUM 8.2*   < > 8.1*  PROT 7.2  --   --   BILITOT 1.0  --   --   ALKPHOS 176*  --   --   ALT 14  --   --   AST 28  --   --   GLUCOSE 113*   < > 150*   < > = values in this interval not displayed.   Lab Results  Component Value Date   CHOL 128 10/31/2017   HDL 37 (L) 10/31/2017   LDLCALC 75 10/31/2017   TRIG 81 10/31/2017   No results found for: DDIMER  Radiology/Studies:  Ct Head Wo Contrast  Result Date: 12/13/2017 CLINICAL DATA:  Syncopal episode.  Decreased responsiveness. EXAM: CT HEAD WITHOUT CONTRAST TECHNIQUE: Contiguous axial images were obtained from the base of the skull through  the vertex without intravenous contrast. COMPARISON:  MR head 10/29/2017.  CT head 10/29/2017. FINDINGS: Brain: No evidence for acute infarction, hemorrhage, mass lesion, hydrocephalus, or extra-axial fluid. Generalized atrophy. Extensive white matter hypoattenuation favored to represent small vessel disease. Vascular: Calcification of the cavernous internal carotid arteries consistent with cerebrovascular  atherosclerotic disease. No signs of intracranial large vessel occlusion. Skull: Normal. Negative for fracture or focal lesion. Sinuses/Orbits: No acute finding. Other: Compared with prior CT and MR, the acute infarcts of the RIGHT internal and external capsule, and LEFT external capsule, demonstrated previously are observed as areas of hypoattenuation on today's examination. IMPRESSION: Atrophy and small vessel disease. Sequelae of recent lacunar infarcts, now subacute to chronic. No acute intracranial findings are evident. Electronically Signed   By: Elsie Stain M.D.   On: 12/13/2017 14:55   Mr Angiogram Head Wo Contrast  Result Date: 12/13/2017 CLINICAL DATA:  Syncopal episode today. EXAM: MRI HEAD WITHOUT CONTRAST MRA HEAD WITHOUT CONTRAST TECHNIQUE: Multiplanar, multiecho pulse sequences of the brain and surrounding structures were obtained without intravenous contrast. Angiographic images of the head were obtained using MRA technique without contrast. COMPARISON:  Head CT 12/13/2017.  Head MRI/MRA 10/29/2017. FINDINGS: MRI HEAD FINDINGS Brain: There is a 1.7 cm acute infarct in the posteromedial left occipital lobe. An acute curvilinear infarct is noted in the left parietal lobe extending from the periventricular to the subcortical white matter. There are also acute subcentimeter periventricular white matter infarcts in the right temporal lobe. A 2 mm focus of mild trace diffusion hyperintensity in the left corona radiata is new from the prior MRI but is without clearly reduced ADC to confirm an acute  infarct though evaluation is limited by its small size. The small acute infarcts on the prior MRI are now largely late subacute in appearance with some early developing encephalomalacia. There is mild residual trace diffusion signal abnormality in the left centrum semiovale and anterior right external capsule corresponding to old infarcts which were present on the prior MRI. No intracranial hemorrhage, mass, midline shift, or extra-axial fluid collection is identified. Chronic lacunar infarcts are noted in the right corona radiata, left cerebellum, and left paramedian pons. Patchy T2 hyperintensities in the cerebral white matter and pons are nonspecific but compatible with moderately extensive chronic small vessel ischemic disease, similar to the prior MRI. There is mild cerebral atrophy. Vascular: Major intracranial vascular flow voids are preserved. Skull and upper cervical spine: Unremarkable bone marrow signal. Sinuses/Orbits: Unremarkable orbits. Trace left mastoid fluid. Minimal paranasal sinus mucosal thickening. Other: None. MRA HEAD FINDINGS The study is moderately motion degraded. There is near complete signal loss in both V4 segments which may be artifactual given the symmetry as well as preserved flow related enhancement in both distal V3 segments, however reduced flow from underlying stenoses is not excluded. Signal loss extends to the vertebrobasilar junction, partially obscuring the proximal most basilar artery. The remainder of the basilar artery is widely patent. There is mild fullness of the basilar tip which is favored to reflect the confluence of the PCA and SCA origins, without a saccular aneurysm identified. Posterior communicating arteries are present bilaterally, right larger than left. The right P1 segment is likely very hypoplastic, and there is milder hypoplasia of the left P1 segment. The P2 segments are grossly patent, however motion limits detailed assessment. The distal right cervical  ICA is tortuous. The intracranial ICAs are widely patent. M1 segments and MCA bifurcations are grossly patent, however motion artifact results in signal loss particularly notable in the right greater than left proximal M2 branches which limits assessment. The ACAs are patent with motion artifact through the A1 and proximal A2 segments limiting evaluation for stenosis. No large aneurysm is identified. IMPRESSION: 1. Small acute infarcts in the left occipital lobe and white matter of the left  parietal and right temporal lobes. 2. Multiple late subacute and chronic infarcts as above. 3. Extensive chronic small vessel ischemic disease. 4. Motion degraded head MRA without large vessel occlusion in the anterior circulation. Signal loss throughout both V4 segments through the vertebrobasilar junction is favored to be artifactual but results in nondiagnostic assessment of these vessels. Electronically Signed   By: Sebastian Ache M.D.   On: 12/13/2017 17:26   Mr Brain Wo Contrast  Result Date: 12/13/2017 CLINICAL DATA:  Syncopal episode today. EXAM: MRI HEAD WITHOUT CONTRAST MRA HEAD WITHOUT CONTRAST TECHNIQUE: Multiplanar, multiecho pulse sequences of the brain and surrounding structures were obtained without intravenous contrast. Angiographic images of the head were obtained using MRA technique without contrast. COMPARISON:  Head CT 12/13/2017.  Head MRI/MRA 10/29/2017. FINDINGS: MRI HEAD FINDINGS Brain: There is a 1.7 cm acute infarct in the posteromedial left occipital lobe. An acute curvilinear infarct is noted in the left parietal lobe extending from the periventricular to the subcortical white matter. There are also acute subcentimeter periventricular white matter infarcts in the right temporal lobe. A 2 mm focus of mild trace diffusion hyperintensity in the left corona radiata is new from the prior MRI but is without clearly reduced ADC to confirm an acute infarct though evaluation is limited by its small size. The  small acute infarcts on the prior MRI are now largely late subacute in appearance with some early developing encephalomalacia. There is mild residual trace diffusion signal abnormality in the left centrum semiovale and anterior right external capsule corresponding to old infarcts which were present on the prior MRI. No intracranial hemorrhage, mass, midline shift, or extra-axial fluid collection is identified. Chronic lacunar infarcts are noted in the right corona radiata, left cerebellum, and left paramedian pons. Patchy T2 hyperintensities in the cerebral white matter and pons are nonspecific but compatible with moderately extensive chronic small vessel ischemic disease, similar to the prior MRI. There is mild cerebral atrophy. Vascular: Major intracranial vascular flow voids are preserved. Skull and upper cervical spine: Unremarkable bone marrow signal. Sinuses/Orbits: Unremarkable orbits. Trace left mastoid fluid. Minimal paranasal sinus mucosal thickening. Other: None. MRA HEAD FINDINGS The study is moderately motion degraded. There is near complete signal loss in both V4 segments which may be artifactual given the symmetry as well as preserved flow related enhancement in both distal V3 segments, however reduced flow from underlying stenoses is not excluded. Signal loss extends to the vertebrobasilar junction, partially obscuring the proximal most basilar artery. The remainder of the basilar artery is widely patent. There is mild fullness of the basilar tip which is favored to reflect the confluence of the PCA and SCA origins, without a saccular aneurysm identified. Posterior communicating arteries are present bilaterally, right larger than left. The right P1 segment is likely very hypoplastic, and there is milder hypoplasia of the left P1 segment. The P2 segments are grossly patent, however motion limits detailed assessment. The distal right cervical ICA is tortuous. The intracranial ICAs are widely patent. M1  segments and MCA bifurcations are grossly patent, however motion artifact results in signal loss particularly notable in the right greater than left proximal M2 branches which limits assessment. The ACAs are patent with motion artifact through the A1 and proximal A2 segments limiting evaluation for stenosis. No large aneurysm is identified. IMPRESSION: 1. Small acute infarcts in the left occipital lobe and white matter of the left parietal and right temporal lobes. 2. Multiple late subacute and chronic infarcts as above. 3. Extensive chronic small  vessel ischemic disease. 4. Motion degraded head MRA without large vessel occlusion in the anterior circulation. Signal loss throughout both V4 segments through the vertebrobasilar junction is favored to be artifactual but results in nondiagnostic assessment of these vessels. Electronically Signed   By: Sebastian Ache M.D.   On: 12/13/2017 17:26   Dg Chest Port 1 View  Result Date: 12/31/2017 CLINICAL DATA:  72 year old female with a history confusion EXAM: PORTABLE CHEST 1 VIEW COMPARISON:  10/29/2017, 03/02/2007 FINDINGS: New airspace opacity at the right lower lung. Cardiomediastinal silhouette unchanged in size and contour. No pneumothorax.  No pleural effusion. Degenerative changes of the shoulders.  No acute displaced fracture IMPRESSION: New airspace opacity at the right lung base suggesting lobar pneumonia. Followup PA and lateral chest X-ray is recommended in 3-4 weeks following trial of therapy to assure resolution. Electronically Signed   By: Gilmer Mor D.O.   On: 12/31/2017 14:23   [place] Ekg Site Rite  Result Date: 01/03/2018 If Site Rite image not attached, placement could not be confirmed due to current cardiac rhythm.   EKG: Normal sinus rhythm  Weights: Filed Weights   12/31/17 1718 01/01/18 0517 01/02/18 0505  Weight: 132 lb 3.2 oz (60 kg) 133 lb (60.3 kg) 133 lb 14.4 oz (60.7 kg)     Physical Exam: Blood pressure (!) 159/77, pulse 77,  temperature 98 F (36.7 C), temperature source Axillary, resp. rate 18, height  (1.626 m), weight 133 lb 14.4 oz (60.7 kg), SpO2 100 %. Body mass index is 22.98 kg/m. General: Well developed, well nourished, in no acute distress. Head eyes ears nose throat: Normocephalic, atraumatic, sclera non-icteric, no xanthomas, nares are without discharge. No apparent thyromegaly and/or mass  Lungs: Normal respiratory effort.  no wheezes, no rales, no rhonchi.  Heart: RRR with normal S1 S2. no murmur gallop, no rub, PMI is normal size and placement, carotid upstroke normal without bruit, jugular venous pressure is normal Abdomen: Soft, non-tender, non-distended with normoactive bowel sounds. No hepatomegaly. No rebound/guarding. No obvious abdominal masses. Abdominal aorta is normal size without bruit Extremities: No edema. no cyanosis, no clubbing, no ulcers  Peripheral : 2+ bilateral upper extremity pulses, 2+ bilateral femoral pulses, 2+ bilateral dorsal pedal pulse Neuro: Alert and oriented. No facial asymmetry. No focal deficit. Moves all extremities spontaneously. Musculoskeletal: Normal muscle tone without kyphosis Psych:  Responds to questions appropriately with a normal affect.    Assessment: 72 year old female with essential hypertension mixed hyperlipidemia paroxysmal nonvalvular atrial fibrillation with methicillin sensitive Staphylococcus aureus bacteremia concerning for endocarditis  Plan: 1.  Continue antibiotic treatment for infection 2.  Proceed to transesophageal echocardiogram for further evaluation of possible endocarditis.  The patient understands the risks and benefits of transesophageal echocardiogram.  This includes the possibility of esophageal perforation sore throat rhythm disturbances stroke or side effects of medication management.  The patient is at low risk for conscious sedation  Signed, Lamar Blinks M.D. [facility] Cardiology 01/03/2018, 6:14 PM

## 2018-01-03 NOTE — Evaluation (Signed)
Physical Therapy Evaluation Patient Details Name: Destiny Mack MRN: [id number] DOB: 18-Sep-1945 Today's Date: 01/03/2018   History of Present Illness  72 y.o. female admitted with pneumonia.  She has been here multiple times in the last few months including multiple small scale strokes, prolonged diarrhea, nausea, and others. PMHx includes: AKI, A-Fib, Depression, Frequent Falls, HTN, mixed incontinence, CVA, Tremor of hands, and face, Appendectomy, and tubal Ligation.  Clinical Impression  Pt showed good effort and willingness to participate with PT but ultimately was very unsafe with standing/ambulation.  She veered to the R with walker t/o the 15 ft of ambulation and leaned heavily to the L with 2 significant LOBs needing PT to keep her upright.  She reports she spends most of her day in w/c that she can self propel even since most recent CVA.  Pt is limited but with home assist should be able to go home with HHPT.    Follow Up Recommendations Home health PT    Equipment Recommendations  None recommended by PT    Recommendations for Other Services       Precautions / Restrictions Precautions Precautions: Fall Restrictions Weight Bearing Restrictions: No      Mobility  Bed Mobility Overal bed mobility: Needs Assistance Bed Mobility: Supine to Sit     Supine to sit: Min assist     General bed mobility comments: Pt showed good effort with getting to EOB, did not need excessive assist  Transfers Overall transfer level: Needs assistance Equipment used: Rolling walker (2 wheeled) Transfers: Sit to/from Stand Sit to Stand: Min assist         General transfer comment: Pt leaning to the R with getting to standing, assist and cues to use walker appropriate and maintain safety  Ambulation/Gait Ambulation/Gait assistance: Mod assist Ambulation Distance (Feet): 15 Feet Assistive device: Rolling walker (2 wheeled)       General Gait Details: Pt with heavy lean to the L  during ambulation, she had 2 LOBs that needed considerable assist from PT to keep from falling.  Pt was more confident with her walking that her safety justified.    Stairs            Wheelchair Mobility    Modified Rankin (Stroke Patients Only)       Balance Overall balance assessment: Needs assistance Sitting-balance support: Feet supported;Bilateral upper extremity supported Sitting balance-Leahy Scale: Fair Sitting balance - Comments: Pt able to maintain upright but overall did not appear overly steady     Standing balance-Leahy Scale: Poor Standing balance comment: Pt with heavy lean to the L at time, unable to use walker effectively to maintain upright.                             Pertinent Vitals/Pain Pain Assessment: No/denies pain    Home Living Family/patient expects to be discharged to:: Private residence Living Arrangements: Children Available Help at Discharge: Family;Available 24 hours/day Type of Home: Apartment Home Access: Level entry       Home Equipment: Bedside commode;Wheelchair - [employer] - 2 wheels      Prior Function Level of Independence: Needs assistance   Gait / Transfers Assistance Needed: Pt. uses a w/c, transfers to and from the w/c.  Pt reports her 24 year old grand son picks her up and takes her to the car if she is going to see the MD  Hand Dominance        Extremity/Trunk Assessment   Upper Extremity Assessment Upper Extremity Assessment: Generalized weakness(R grossly 2+/5, L with more functional AROM)    Lower Extremity Assessment Lower Extremity Assessment: Generalized weakness(grossly 4-/5 t/o, roughly equal bilaterally)       Communication   Communication: No difficulties  Cognition Arousal/Alertness: Awake/alert Behavior During Therapy: WFL for tasks assessed/performed;Flat affect Overall Cognitive Status: No family/caregiver present to determine baseline cognitive functioning                                  General Comments: Pt with some apparent confusion, inconsistent answers to so mobility/function/etc questions      General Comments      Exercises     Assessment/Plan    PT Assessment Patient needs continued PT services  PT Problem List Decreased strength;Decreased mobility;Decreased balance;Decreased activity tolerance;Decreased cognition       PT Treatment Interventions DME instruction;Therapeutic activities;Gait training;Therapeutic exercise;Patient/family education;Cognitive remediation;Stair training;Balance training;Functional mobility training;Neuromuscular re-education    PT Goals (Current goals can be found in the Care Plan section)  Acute Rehab PT Goals Patient Stated Goal: Go home PT Goal Formulation: With patient Time For Goal Achievement: 01/17/18 Potential to Achieve Goals: Fair    Frequency Min 2X/week   Barriers to discharge        Co-evaluation               AM-PAC PT "6 Clicks" Daily Activity  Outcome Measure Difficulty turning over in bed (including adjusting bedclothes, sheets and blankets)?: A Little Difficulty moving from lying on back to sitting on the side of the bed? : Unable Difficulty sitting down on and standing up from a chair with arms (e.g., wheelchair, bedside commode, etc,.)?: Unable Help needed moving to and from a bed to chair (including a wheelchair)?: A Little Help needed walking in hospital room?: A Lot Help needed climbing 3-5 steps with a railing? : Total 6 Click Score: 11    End of Session Equipment Utilized During Treatment: Gait belt Activity Tolerance: Patient limited by fatigue Patient left: with chair alarm set;with call bell/phone within reach Nurse Communication: Mobility status PT Visit Diagnosis: Unsteadiness on feet (R26.81);Muscle weakness (generalized) (M62.81)    Time: 1610-9604 PT Time Calculation (min) (ACUTE ONLY): 27 min   Charges:   PT Evaluation $PT  Eval Low Complexity: 1 Low     PT G CodesMalachi Pro, DPT 01/03/2018, 11:09 AM

## 2018-01-03 NOTE — Progress Notes (Signed)
[facility] [place] at [facility]   PATIENT NAME: Destiny Mack    MR#:  [id number]  DATE OF BIRTH:  February 12, 1946  SUBJECTIVE:   Patient here due to altered mental status and noted to have bacteremia secondary to MSSA.  The source is thought to be pneumonia.  Clinically asymptomatic now.  No complaints or events overnight.  REVIEW OF SYSTEMS:    Review of Systems  Constitutional: Negative for chills and fever.  HENT: Negative for congestion and tinnitus.   Eyes: Negative for blurred vision and double vision.  Respiratory: Negative for cough, shortness of breath and wheezing.   Cardiovascular: Negative for chest pain, orthopnea and PND.  Gastrointestinal: Negative for abdominal pain, diarrhea, nausea and vomiting.  Genitourinary: Negative for dysuria and hematuria.  Neurological: Negative for dizziness, sensory change and focal weakness.  All other systems reviewed and are negative.   Nutrition: 2 GM sodium Tolerating Diet: Yes Tolerating PT:  Eval noted.   DRUG ALLERGIES:  No Known Allergies  VITALS:  Blood pressure (!) 159/77, pulse 77, temperature 98 F (36.7 C), temperature source Axillary, resp. rate 18, height  (1.626 m), weight 60.7 kg (133 lb 14.4 oz), SpO2 100 %.  PHYSICAL EXAMINATION:   Physical Exam  GENERAL:  72 y.o.-year-old patient sitting up in chair in no acute distress.  EYES: Pupils equal, round, reactive to light and accommodation. No scleral icterus. Extraocular muscles intact.  HEENT: Head atraumatic, normocephalic. Oropharynx and nasopharynx clear.  NECK:  Supple, no jugular venous distention. No thyroid enlargement, no tenderness.  LUNGS: Normal breath sounds bilaterally, no wheezing, rales, rhonchi. No use of accessory muscles of respiration.  CARDIOVASCULAR: S1, S2 normal. No murmurs, rubs, or gallops.  ABDOMEN: Soft, nontender, nondistended. Bowel sounds present. No organomegaly or mass.  EXTREMITIES: No cyanosis, clubbing or  edema b/l.    NEUROLOGIC: Cranial nerves II through XII are intact. No focal Motor or sensory deficits b/l.   PSYCHIATRIC: The patient is alert and oriented x 3.  SKIN: No obvious rash, lesion, or ulcer.    LABORATORY PANEL:   CBC Recent Labs  Lab 01/01/18 0735  WBC 7.1  HGB 11.3*  HCT 33.9*  PLT 205   ------------------------------------------------------------------------------------------------------------------  Chemistries  Recent Labs  Lab 12/31/17 1357  01/02/18 0540  NA 135   < > 139  K 3.9   < > 4.0  CL 104   < > 112*  CO2 20*   < > 20*  GLUCOSE 113*   < > 150*  BUN 23*   < > 29*  CREATININE 1.90*   < > 1.68*  CALCIUM 8.2*   < > 8.1*  AST 28  --   --   ALT 14  --   --   ALKPHOS 176*  --   --   BILITOT 1.0  --   --    < > = values in this interval not displayed.   ------------------------------------------------------------------------------------------------------------------  Cardiac Enzymes Recent Labs  Lab 12/31/17 1357  TROPONINI 0.03*   ------------------------------------------------------------------------------------------------------------------  RADIOLOGY:  [place] Ekg Site Rite  Result Date: 01/03/2018 If Site Rite image not attached, placement could not be confirmed due to current cardiac rhythm.    ASSESSMENT AND PLAN:   72 year old female with past medical history of atrial fibrillation, hypertension, previous CVA, depression who presented to the hospital due to altered mental status, shortness of breath and noted to have pneumonia along with MSSA bacteremia.  1.  Sepsis- secondary to pneumonia  with MSSA bacteremia. - Continue IV Ancef for now.  Repeat blood cultures are negative and therefore order for PICC line placed as per infectious disease. - We will still need TEE to rule out endocarditis as patient's TTE was negative.  2.  MSSA bacteremia- source thought to be secondary to pneumonia. -Need to rule out endocarditis.  TTE  negative.  Cardiology consult to get TEE. -PICC line to be placed today, will need 2 weeks of IV Ancef as per infectious disease.  3.  Altered mental status- metabolic encephalopathy secondary to sepsis/bacteremia. -Much improved mental status back to baseline.  4.  History of paroxysmal atrial fibrillation-rate controlled. -Continue Eliquis.  5. Hx of Previous CVA - cont. Eliquis  6. Hyperlipidemia - cont. Atorvastatin  7. Essential HTN - cont. Norvasc.    All the records are reviewed and case discussed with Care Management/Social Worker. Management plans discussed with the patient, family and they are in agreement.  CODE STATUS: DNR  DVT Prophylaxis: Eliquis  TOTAL TIME TAKING CARE OF THIS PATIENT: 30 minutes.   POSSIBLE D/C IN 2-3 DAYS, DEPENDING ON CLINICAL CONDITION.   Houston Siren M.D on 01/03/2018 at 3:05 PM  Between 7am to 6pm - Pager - [phone]  After 6pm go to [email or web address] - Social research officer, government  [facility] [place] [facility]  Office  [phone]  CC: Primary care physician; Oswaldo Conroy, MD

## 2018-01-03 NOTE — Plan of Care (Signed)
  Problem: Respiratory: Goal: Ability to maintain adequate ventilation will improve Outcome: Progressing Goal: Ability to maintain a clear airway will improve Outcome: Progressing   Problem: Education: Goal: Knowledge of General Education information will improve Outcome: Progressing   Problem: Safety: Goal: Ability to remain free from injury will improve Outcome: Progressing

## 2018-01-03 NOTE — Plan of Care (Signed)
VSS. Denies pain. Pt had a PICC line placed today. Pt scheduled for TEE tomorrow. Informed consent not signed, pt wants cardiologist to notify pt's daughter, Natalia Leatherwood about the procedure.

## 2018-01-03 NOTE — Progress Notes (Signed)
Peripherally Inserted Central Catheter/Midline Placement  The IV Nurse has discussed with the patient and/or persons authorized to consent for the patient, the purpose of this procedure and the potential benefits and risks involved with this procedure.  The benefits include less needle sticks, lab draws from the catheter, and the patient may be discharged home with the catheter. Risks include, but not limited to, infection, bleeding, blood clot (thrombus formation), and puncture of an artery; nerve damage and irregular heartbeat and possibility to perform a PICC exchange if needed/ordered by physician.  Alternatives to this procedure were also discussed.  Bard Power PICC patient education guide, fact sheet on infection prevention and patient information card has been provided to patient /or left at bedside.    PICC/Midline Placement Documentation  PICC Single Lumen 01/03/18 PICC Left Basilic 44 cm 2 cm (Active)  Indication for Insertion or Continuance of Line Home intravenous therapies (PICC only) 01/03/2018  3:00 PM  Exposed Catheter (cm) 2 cm 01/03/2018  3:00 PM  Site Assessment Clean;Dry;Intact 01/03/2018  3:00 PM  Line Status Flushed;Blood return noted 01/03/2018  3:00 PM  Dressing Type Transparent 01/03/2018  3:00 PM  Dressing Status Clean;Dry;Intact;Antimicrobial disc in place 01/03/2018  3:00 PM  Dressing Change Due 01/10/18 01/03/2018  3:00 PM       Stacie Glaze Horton 01/03/2018, 3:45 PM

## 2018-01-04 ENCOUNTER — Encounter
Admission: EM | Disposition: A | Discharge status: Home Health | Payer: Self-pay | Source: Home / Self Care | Attending: Specialist

## 2018-01-04 ENCOUNTER — Inpatient Hospital Stay
Admit: 2018-01-04 | Discharge: 2018-01-04 | Disposition: A | Discharge status: Home / Self Care | Payer: Medicare HMO | Attending: Internal Medicine | Admitting: Internal Medicine

## 2018-01-04 ENCOUNTER — Inpatient Hospital Stay: Admit: 2018-01-04 | Payer: Medicare HMO

## 2018-01-04 HISTORY — PX: TEE WITHOUT CARDIOVERSION: SHX5443

## 2018-01-04 LAB — CBC
HCT: 30.6 % — ABNORMAL LOW (ref 35.0–47.0)
HEMOGLOBIN: 10.2 g/dL — AB (ref 12.0–16.0)
MCH: 30.1 pg (ref 26.0–34.0)
MCHC: 33.4 g/dL (ref 32.0–36.0)
MCV: 90 fL (ref 80.0–100.0)
Platelets: 250 10*3/uL (ref 150–440)
RBC: 3.4 MIL/uL — ABNORMAL LOW (ref 3.80–5.20)
RDW: 16.6 % — ABNORMAL HIGH (ref 11.5–14.5)
WBC: 9.5 10*3/uL (ref 3.6–11.0)

## 2018-01-04 LAB — CREATININE, SERUM
CREATININE: 1.2 mg/dL — AB (ref 0.44–1.00)
GFR, EST AFRICAN AMERICAN: 51 mL/min — AB (ref >60)
GFR, EST NON AFRICAN AMERICAN: 44 mL/min — AB (ref >60)

## 2018-01-04 SURGERY — ECHOCARDIOGRAM, TRANSESOPHAGEAL
Anesthesia: Moderate Sedation

## 2018-01-04 MED ORDER — SODIUM CHLORIDE FLUSH 0.9 % IV SOLN
INTRAVENOUS | Status: AC
Start: 2018-01-04 — End: 2018-01-04
  Administered 2018-01-04: 15:00:00
  Filled 2018-01-04: qty 10

## 2018-01-04 MED ORDER — FENTANYL CITRATE (PF) 100 MCG/2ML IJ SOLN
INTRAMUSCULAR | Status: AC | PRN
  Administered 2018-01-04: 50 ug via INTRAVENOUS

## 2018-01-04 MED ORDER — MIDAZOLAM HCL 2 MG/2ML IJ SOLN
INTRAMUSCULAR | Status: AC | PRN
  Administered 2018-01-04: 2 mg via INTRAVENOUS

## 2018-01-04 MED ORDER — CEFAZOLIN SODIUM-DEXTROSE 2-4 GM/100ML-% IV SOLN: 2.0000 g | Freq: Three times a day (TID) | INTRAVENOUS | Status: AC

## 2018-01-04 MED ORDER — ONDANSETRON HCL 4 MG/2ML IJ SOLN
INTRAMUSCULAR | Status: AC
  Administered 2018-01-04: 08:00:00
  Filled 2018-01-04: qty 2

## 2018-01-04 MED ORDER — FENTANYL CITRATE (PF) 100 MCG/2ML IJ SOLN
INTRAMUSCULAR | Status: AC
  Administered 2018-01-04: 15:00:00
  Filled 2018-01-04: qty 2

## 2018-01-04 MED ORDER — MIDAZOLAM HCL 5 MG/5ML IJ SOLN
INTRAMUSCULAR | Status: AC
  Administered 2018-01-04: 15:00:00
  Filled 2018-01-04: qty 5

## 2018-01-04 MED ORDER — LIDOCAINE VISCOUS 2 % MT SOLN
OROMUCOSAL | Status: AC
  Administered 2018-01-04: 15:00:00
  Filled 2018-01-04: qty 15

## 2018-01-04 MED ORDER — CEFAZOLIN IV (FOR PTA / DISCHARGE USE ONLY): 2.0000 g | Freq: Two times a day (BID) | INTRAVENOUS | 0 refills | Status: AC

## 2018-01-04 MED ORDER — MIDAZOLAM HCL 5 MG/5ML IJ SOLN
INTRAMUSCULAR | Status: AC | PRN
  Administered 2018-01-04: 1 mg via INTRAVENOUS

## 2018-01-04 MED ORDER — SODIUM CHLORIDE 0.9 % IV SOLN: INTRAVENOUS | Status: DC

## 2018-01-04 MED ORDER — BUTAMBEN-TETRACAINE-BENZOCAINE 2-2-14 % EX AERO
INHALATION_SPRAY | CUTANEOUS | Status: AC
  Administered 2018-01-04: 15:00:00
  Filled 2018-01-04: qty 5

## 2018-01-04 MED ORDER — CEFAZOLIN SODIUM-DEXTROSE 2-4 GM/100ML-% IV SOLN
2.0000 g | Freq: Three times a day (TID) | INTRAVENOUS | Status: DC
  Administered 2018-01-04: 2 g via INTRAVENOUS
  Filled 2018-01-04 (×4): qty 100

## 2018-01-04 NOTE — Progress Notes (Signed)
*  PRELIMINARY RESULTS* Echocardiogram Echocardiogram Transesophageal has been performed.  Destiny Mack 01/04/2018, 4:29 PM

## 2018-01-04 NOTE — Discharge Summary (Signed)
[place] at [place] NAME: Destiny Mack    MR#:  [id number]  DATE OF BIRTH:  September 22, 1945  DATE OF ADMISSION:  12/31/2017 ADMITTING PHYSICIAN: Hillary Bow, MD  DATE OF DISCHARGE: 01/04/2018  PRIMARY CARE PHYSICIAN: Letta Median, MD    ADMISSION DIAGNOSIS:  Delirium [R41.0] Acute kidney injury ([place]) [N17.9] Hospital acquired PNA [J18.9] Sepsis, due to unspecified organism ([place]) [A41.9]  DISCHARGE DIAGNOSIS:  Active Problems:   Pneumonia   Malnutrition of moderate degree   SECONDARY DIAGNOSIS:   Past Medical History:  Diagnosis Date  . A-fib ([place])   . Depression   . Frequent falls   . Hypertension   . Leaking of urine   . Mixed incontinence   . Stroke ([place])   . Tremor of hands and face     HOSPITAL COURSE:   72 year old female with past medical history of atrial fibrillation, hypertension, previous CVA, depression who presented to the hospital due to altered mental status, shortness of breath and noted to have pneumonia along with MSSA bacteremia.  1.  Sepsis- secondary to pneumonia with MSSA bacteremia. -Initially patient was treated with broad-spectrum IV antibiotics with vancomycin/Zosyn.  Patient's blood cultures came back to be secondary to MSSA and therefore patient was narrowed down to just IV Ancef. -The source of the MSSA bacteremia is likely pneumonia.  Patient underwent a TTE and a TEE which are negative for endocarditis.  Patient's repeat blood cultures remain negative. - She is now being discharged on IV Ancef for total of 2-week course.  She has a PICC line in place and will have home health nursing arranged for her.  2.  MSSA bacteremia- source is secondary to pneumonia. -Endocarditis has been ruled out with a negative TTE and also a TEE.  Repeat blood cultures are negative.  Patient is status post PICC line placement and now being discharged on 2 weeks of IV Ancef.  3.  Altered mental status- metabolic  encephalopathy secondary to sepsis/bacteremia. -Much improved mental status back to baseline.  4.  History of paroxysmal atrial fibrillation-rate controlled. -Continue Eliquis.  5. Hx of Previous CVA - cont. Eliquis  6. Hyperlipidemia - she will cont. Atorvastatin  7. Essential HTN - she will cont. Norvasc, Lisinopril.    DISCHARGE CONDITIONS:   Stable  CONSULTS OBTAINED:  Treatment Team:  Leonel Ramsay, MD Corey Skains, MD  DRUG ALLERGIES:  No Known Allergies  DISCHARGE MEDICATIONS:   Allergies as of 01/04/2018   No Known Allergies     Medication List    STOP taking these medications   aspirin 325 MG tablet     TAKE these medications   amLODipine 5 MG tablet Commonly known as:  NORVASC Take 5 mg by mouth daily.   apixaban 5 MG Tabs tablet Commonly known as:  ELIQUIS Take 1 tablet (5 mg total) by mouth 2 (two) times daily.   atorvastatin 20 MG tablet Commonly known as:  LIPITOR Take 20 mg by mouth at bedtime.   ceFAZolin 2-4 GM/100ML-% IVPB Commonly known as:  ANCEF Inject 100 mLs (2 g total) into the vein every 8 (eight) hours for 14 days.   ceFAZolin IVPB Commonly known as:  ANCEF Inject 2 g into the vein every 12 (twelve) hours for 14 days. Indication:  Bacteremia Last Day of Therapy:  May 24 Labs - Once weekly:  CBC/D and BMP, Labs - Every other week:  ESR and CRP   lisinopril 30 MG  tablet Commonly known as:  PRINIVIL,ZESTRIL Take 30 mg by mouth daily.            Home Infusion Instuctions  (From admission, onward)        Start     Ordered   01/04/18 0000  Home infusion instructions [facility] May follow [place] Dosing Protocol; May administer Cathflo as needed to maintain patency of vascular access device.; Flushing of vascular access device: per [facility] Protocol: 0.9% NaCl pre/post medica...    Question Answer Comment  Instructions May follow [place] Dosing Protocol   Instructions May administer Cathflo as  needed to maintain patency of vascular access device.   Instructions Flushing of vascular access device: per [facility] Protocol: 0.9% NaCl pre/post medication administration and prn patency; Heparin 100 u/ml, 36m for implanted ports and Heparin 10u/ml, 589mfor all other central venous catheters.   Instructions May follow [facility] Anaphylaxis Protocol for First Dose Administration in the home: 0.9% NaCl at 25-50 ml/hr to maintain IV access for protocol meds. Epinephrine 0.3 ml IV/IM PRN and Benadryl 25-50 IV/IM PRN s/s of anaphylaxis.   Instructions [facility] Infusion Coordinator (RN) to assist per patient IV care needs in the home PRN.      01/04/18 1604        DISCHARGE INSTRUCTIONS:   DIET:  Cardiac diet  DISCHARGE CONDITION:  Stable  ACTIVITY:  Activity as tolerated  OXYGEN:  Home Oxygen: No.   Oxygen Delivery: room air  DISCHARGE LOCATION:  Home with Home health Nursing.    If you experience worsening of your admission symptoms, develop shortness of breath, life threatening emergency, suicidal or homicidal thoughts you must seek medical attention immediately by calling 911 or calling your MD immediately  if symptoms less severe.  You Must read complete instructions/literature along with all the possible adverse reactions/side effects for all the Medicines you take and that have been prescribed to you. Take any new Medicines after you have completely understood and accpet all the possible adverse reactions/side effects.   Please note  You were cared for by a hospitalist during your hospital stay. If you have any questions about your discharge medications or the care you received while you were in the hospital after you are discharged, you can call the unit and asked to speak with the hospitalist on call if the hospitalist that took care of you is not available. Once you are discharged, your primary care physician will handle any further medical issues. Please note that NO REFILLS  for any discharge medications will be authorized once you are discharged, as it is imperative that you return to your primary care physician (or establish a relationship with a primary care physician if you do not have one) for your aftercare needs so that they can reassess your need for medications and monitor your lab values.     Today   No acute events overnight.  Status post TEE today showing no acute vegetations.  Afebrile.  Hemodynamically stable.  Will discharge home with IV antibiotics and home health nursing.  VITAL SIGNS:  Blood pressure (!) 141/80, pulse 73, temperature 98.2 F (36.8 C), temperature source Oral, resp. rate 16, height '5\' 4"'  (1.626 m), weight 61.2 kg (135 lb), SpO2 100 %.  I/O:  No intake or output data in the 24 hours ending 01/04/18 1614  PHYSICAL EXAMINATION:   GENERAL:  [age].o.-year-old patient sitting up in chair in no acute distress.  EYES: Pupils equal, round, reactive to  light and accommodation. No scleral icterus. Extraocular muscles intact.  HEENT: Head atraumatic, normocephalic. Oropharynx and nasopharynx clear.  NECK:  Supple, no jugular venous distention. No thyroid enlargement, no tenderness.  LUNGS: Normal breath sounds bilaterally, no wheezing, rales, rhonchi. No use of accessory muscles of respiration.  CARDIOVASCULAR: S1, S2 normal. No murmurs, rubs, or gallops.  ABDOMEN: Soft, nontender, nondistended. Bowel sounds present. No organomegaly or mass.  EXTREMITIES: No cyanosis, clubbing or edema b/l.    NEUROLOGIC: Cranial nerves II through XII are intact. No focal Motor or sensory deficits b/l.   PSYCHIATRIC: The patient is alert and oriented x 3.  SKIN: No obvious rash, lesion, or ulcer.    DATA REVIEW:   CBC Recent Labs  Lab 01/04/18 0604  WBC 9.5  HGB 10.2*  HCT 30.6*  PLT 250    Chemistries  Recent Labs  Lab 12/31/17 1357  01/02/18 0540 01/04/18 0604  NA 135   < > 139  --   K 3.9   < > 4.0  --   CL 104   < > 112*  --    CO2 20*   < > 20*  --   GLUCOSE 113*   < > 150*  --   BUN 23*   < > 29*  --   CREATININE 1.90*   < > 1.68* 1.20*  CALCIUM 8.2*   < > 8.1*  --   AST 28  --   --   --   ALT 14  --   --   --   ALKPHOS 176*  --   --   --   BILITOT 1.0  --   --   --    < > = values in this interval not displayed.    Cardiac Enzymes Recent Labs  Lab 12/31/17 1357  TROPONINI 0.03*    Microbiology Results  Results for orders placed or performed during the hospital encounter of 12/31/17  Blood Culture (routine x 2)     Status: Abnormal   Collection Time: 12/31/17  1:57 PM  Result Value Ref Range Status   Specimen Description   Final    BLOOD 1 LEFT WRIST Performed at [facility], [address] [place], [place] [address]    Special Requests   Final    BOTTLES DRAWN AEROBIC AND ANAEROBIC Performed at [facility], [address], [place], [place] [address]    Culture  Setup Time   Final    GRAM POSITIVE COCCI AEROBIC BOTTLE ONLY CRITICAL RESULT CALLED TO, READ BACK BY AND VERIFIED WITH: [place] 01/01/18 SDR Performed at [facility], [place] [address] [place], [place] [address]    Culture STAPHYLOCOCCUS AUREUS (A)  Final   Report Status 01/03/2018 FINAL  Final   Organism ID, Bacteria STAPHYLOCOCCUS AUREUS  Final      Susceptibility   Staphylococcus aureus - MIC*    CIPROFLOXACIN <=0.5 SENSITIVE Sensitive     ERYTHROMYCIN <=0.25 SENSITIVE Sensitive     GENTAMICIN <=0.5 SENSITIVE Sensitive     OXACILLIN 0.5 SENSITIVE Sensitive     TETRACYCLINE <=1 SENSITIVE Sensitive     VANCOMYCIN 1 SENSITIVE Sensitive     TRIMETH/SULFA <=10 SENSITIVE Sensitive     CLINDAMYCIN <=0.25 SENSITIVE Sensitive     RIFAMPIN <=0.5 SENSITIVE Sensitive     Inducible Clindamycin NEGATIVE Sensitive     * STAPHYLOCOCCUS AUREUS  Blood Culture (routine x 2)     Status: None (Preliminary  result)   Collection Time: 12/31/17  1:57 PM  Result Value Ref Range Status   Specimen  Description BLOOD 2 C FA  Final   Special Requests BOTTLES DRAWN AEROBIC AND ANAEROBIC  Final   Culture   Final    NO GROWTH 4 DAYS Performed at [facility], [address], [place], [place] [address]    Report Status PENDING  Incomplete  Urine culture     Status: None   Collection Time: 12/31/17  1:57 PM  Result Value Ref Range Status   Specimen Description   Final    URINE, RANDOM Performed at [facility], [address] [place], [place] [address]    Special Requests   Final    NONE Performed at [facility], [address] [place], [place] [address]    Culture   Final    NO GROWTH Performed at [facility], [place] [address], [place], [place] [address]    Report Status 01/01/2018 FINAL  Final  Blood Culture ID Panel (Reflexed)     Status: Abnormal   Collection Time: 12/31/17  1:57 PM  Result Value Ref Range Status   Enterococcus species NOT DETECTED NOT DETECTED Final   Listeria monocytogenes NOT DETECTED NOT DETECTED Final   Staphylococcus species DETECTED (A) NOT DETECTED Final    Comment: CRITICAL RESULT CALLED TO, READ BACK BY AND VERIFIED WITH:  DAVID BESANTI AT 0603 01/01/18 SDR    Staphylococcus aureus DETECTED (A) NOT DETECTED Final    Comment: Methicillin (oxacillin) susceptible Staphylococcus aureus (MSSA). Preferred therapy is anti staphylococcal beta lactam antibiotic (Cefazolin or Nafcillin), unless clinically contraindicated. CRITICAL RESULT CALLED TO, READ BACK BY AND VERIFIED WITH: [place] 01/01/18 SDR    Methicillin resistance NOT DETECTED NOT DETECTED Final   Streptococcus species NOT DETECTED NOT DETECTED Final   Streptococcus agalactiae NOT DETECTED NOT DETECTED Final   Streptococcus pneumoniae NOT DETECTED NOT DETECTED Final   Streptococcus pyogenes NOT DETECTED NOT DETECTED Final   Acinetobacter baumannii NOT DETECTED NOT DETECTED Final   Enterobacteriaceae species NOT DETECTED NOT DETECTED Final    Enterobacter cloacae complex NOT DETECTED NOT DETECTED Final   Escherichia coli NOT DETECTED NOT DETECTED Final   Klebsiella oxytoca NOT DETECTED NOT DETECTED Final   Klebsiella pneumoniae NOT DETECTED NOT DETECTED Final   Proteus species NOT DETECTED NOT DETECTED Final   Serratia marcescens NOT DETECTED NOT DETECTED Final   Haemophilus influenzae NOT DETECTED NOT DETECTED Final   Neisseria meningitidis NOT DETECTED NOT DETECTED Final   Pseudomonas aeruginosa NOT DETECTED NOT DETECTED Final   Candida albicans NOT DETECTED NOT DETECTED Final   Candida glabrata NOT DETECTED NOT DETECTED Final   Candida krusei NOT DETECTED NOT DETECTED Final   Candida parapsilosis NOT DETECTED NOT DETECTED Final   Candida tropicalis NOT DETECTED NOT DETECTED Final    Comment: Performed at [facility], [address] [place], [place] [address]  MRSA PCR Screening     Status: None   Collection Time: 12/31/17  6:06 PM  Result Value Ref Range Status   MRSA by PCR NEGATIVE NEGATIVE Final    Comment:        The GeneXpert MRSA Assay (FDA approved for NASAL specimens only), is one component of a comprehensive MRSA colonization surveillance program. It is not intended to diagnose MRSA infection nor to guide or monitor treatment for MRSA infections. Performed at [facility], [address] [place], [place] [address]  Culture, blood (single) w Reflex to ID Panel     Status: None (Preliminary result)   Collection Time: 01/01/18  2:59 PM  Result Value Ref Range Status   Specimen Description BLOOD BLOOD RIGHT WRIST  Final   Special Requests   Final    BOTTLES DRAWN AEROBIC AND ANAEROBIC Blood Culture adequate volume   Culture   Final    NO GROWTH 3 DAYS Performed at [facility], [address], [place], [place] [address]    Report Status PENDING  Incomplete    RADIOLOGY:  [place] Ekg Site Rite  Result Date: 01/03/2018 If Site Rite image not attached, placement could not be  confirmed due to current cardiac rhythm.     Management plans discussed with the patient, family and they are in agreement.  CODE STATUS:     Code Status Orders  (From admission, onward)        Start     Ordered   12/31/17 1537  Do not attempt resuscitation (DNR)  Continuous    Question Answer Comment  In the event of cardiac or respiratory ARREST Do not call a "code blue"   In the event of cardiac or respiratory ARREST Do not perform Intubation, CPR, defibrillation or ACLS   In the event of cardiac or respiratory ARREST Use medication by any route, position, wound care, and other measures to relive pain and suffering. May use oxygen, suction and manual treatment of airway obstruction as needed for comfort.      12/31/17 1538    TOTAL TIME TAKING CARE OF THIS PATIENT: 40 minutes.    Henreitta Leber M.D on 01/04/2018 at 4:14 PM  Between 7am to 6pm - Pager - [phone]  After 6pm go to [email or web address] - Proofreader  [facility] [place] [facility]  Office  [phone]  CC: Primary care physician; Letta Median, MD

## 2018-01-04 NOTE — Progress Notes (Signed)
Pt. Vomited approx. 30 ml clear emesis. Able to suction out fluids for pt. Pt. Med. With zofran 4 mg IV at 07:55. Second attempt made to reach daughter. Message left on cell phone now. Dr. Gwen Pounds by pt. Bedside again now and made aware of both situations. No orders at present.

## 2018-01-04 NOTE — CV Procedure (Signed)
Transesophageal echocardiogram Patient underwent a transesophageal echocardiogram showing normal LV systolic function with ejection fraction of 55 to 60%. Mitral tricuspid aortic and pulmonic valve function normally with minimal and/or trivial regurgitation There is no evidence of vegetation There is no evidence of patent foramen ovale or atrial septal defect There is moderate to severe aortic atherosclerosis

## 2018-01-04 NOTE — Progress Notes (Signed)
[facility] Cardiology [facility] Encounter Note  Patient: Destiny Mack / Admit Date: 12/31/2017 / Date of Encounter: 01/04/2018, 8:18 AM   Subjective: The patient is not overall oriented today and unable to give consent for transesophageal echocardiogram for further evaluation of possibility of endocarditis with methicillin sensitive Staphylococcus aureus bacteremia.  Family members unable to be obtained for further evaluation and consent Patient somewhat nauseated this morning but no other significant symptoms  Review of Systems: Positive for: Nausea Negative for: Vision change, hearing change, syncope, dizziness,  vomiting,diarrhea, bloody stool, stomach pain, cough, congestion, diaphoresis, urinary frequency, urinary pain,skin lesions, skin rashes Others previously listed  Objective: Telemetry: Normal sinus rhythm Physical Exam: Blood pressure (!) 157/74, pulse 75, temperature 98.1 F (36.7 C), temperature source Oral, resp. rate 18, height  (1.626 m), weight 135 lb (61.2 kg), SpO2 95 %. Body mass index is 23.17 kg/m. General: Well developed, well nourished, in no acute distress. Head: Normocephalic, atraumatic, sclera non-icteric, no xanthomas, nares are without discharge. Neck: No apparent masses Lungs: Normal respirations with no wheezes, no rhonchi, no rales , no crackles   Heart: Regular rate and rhythm, normal S1 S2, no murmur, no rub, no gallop, PMI is normal size and placement, carotid upstroke normal without bruit, jugular venous pressure normal Abdomen: Soft, non-tender, non-distended with normoactive bowel sounds. No hepatosplenomegaly. Abdominal aorta is normal size without bruit Extremities: Trace edema, no clubbing, no cyanosis, no ulcers,  Peripheral: 2+ radial, 2+ femoral, 2+ dorsal pedal pulses Neuro: Not alert and oriented. Moves all extremities spontaneously. Psych: Does not responds to questions appropriately with a normal affect.  No intake or output data in  the 24 hours ending 01/04/18 0818  Inpatient Medications:  . [MAR Hold] amLODipine  5 mg Oral Daily  . [MAR Hold] apixaban  5 mg Oral BID  . [MAR Hold] atorvastatin  20 mg Oral QHS  . [MAR Hold] feeding supplement (ENSURE ENLIVE)  237 mL Oral BID BM  . [MAR Hold] multivitamin with minerals  1 tablet Oral Daily  . ondansetron      . [MAR Hold] sodium chloride flush  10-40 mL Intracatheter Q12H   Infusions:  . sodium chloride    . [MAR Hold]  ceFAZolin (ANCEF) IV Stopped (01/04/18 0640)    Labs: Recent Labs    01/02/18 0540 01/04/18 0604  NA 139  --   K 4.0  --   CL 112*  --   CO2 20*  --   GLUCOSE 150*  --   BUN 29*  --   CREATININE 1.68* 1.20*  CALCIUM 8.1*  --    No results for input(s): AST, ALT, ALKPHOS, BILITOT, PROT, ALBUMIN in the last 72 hours. Recent Labs    01/04/18 0604  WBC 9.5  HGB 10.2*  HCT 30.6*  MCV 90.0  PLT 250   No results for input(s): CKTOTAL, CKMB, TROPONINI in the last 72 hours. Invalid input(s): POCBNP No results for input(s): HGBA1C in the last 72 hours.   Weights: Filed Weights   01/02/18 0505 01/04/18 0500 01/04/18 0730  Weight: 133 lb 14.4 oz (60.7 kg) 135 lb 3.2 oz (61.3 kg) 135 lb (61.2 kg)     Radiology/Studies:  Ct Head Wo Contrast  Result Date: 12/13/2017 CLINICAL DATA:  Syncopal episode.  Decreased responsiveness. EXAM: CT HEAD WITHOUT CONTRAST TECHNIQUE: Contiguous axial images were obtained from the base of the skull through the vertex without intravenous contrast. COMPARISON:  MR head 10/29/2017.  CT head 10/29/2017. FINDINGS:  Brain: No evidence for acute infarction, hemorrhage, mass lesion, hydrocephalus, or extra-axial fluid. Generalized atrophy. Extensive white matter hypoattenuation favored to represent small vessel disease. Vascular: Calcification of the cavernous internal carotid arteries consistent with cerebrovascular atherosclerotic disease. No signs of intracranial large vessel occlusion. Skull: Normal. Negative for  fracture or focal lesion. Sinuses/Orbits: No acute finding. Other: Compared with prior CT and MR, the acute infarcts of the RIGHT internal and external capsule, and LEFT external capsule, demonstrated previously are observed as areas of hypoattenuation on today's examination. IMPRESSION: Atrophy and small vessel disease. Sequelae of recent lacunar infarcts, now subacute to chronic. No acute intracranial findings are evident. Electronically Signed   By: Elsie Stain M.D.   On: 12/13/2017 14:55   Mr Angiogram Head Wo Contrast  Result Date: 12/13/2017 CLINICAL DATA:  Syncopal episode today. EXAM: MRI HEAD WITHOUT CONTRAST MRA HEAD WITHOUT CONTRAST TECHNIQUE: Multiplanar, multiecho pulse sequences of the brain and surrounding structures were obtained without intravenous contrast. Angiographic images of the head were obtained using MRA technique without contrast. COMPARISON:  Head CT 12/13/2017.  Head MRI/MRA 10/29/2017. FINDINGS: MRI HEAD FINDINGS Brain: There is a 1.7 cm acute infarct in the posteromedial left occipital lobe. An acute curvilinear infarct is noted in the left parietal lobe extending from the periventricular to the subcortical white matter. There are also acute subcentimeter periventricular white matter infarcts in the right temporal lobe. A 2 mm focus of mild trace diffusion hyperintensity in the left corona radiata is new from the prior MRI but is without clearly reduced ADC to confirm an acute infarct though evaluation is limited by its small size. The small acute infarcts on the prior MRI are now largely late subacute in appearance with some early developing encephalomalacia. There is mild residual trace diffusion signal abnormality in the left centrum semiovale and anterior right external capsule corresponding to old infarcts which were present on the prior MRI. No intracranial hemorrhage, mass, midline shift, or extra-axial fluid collection is identified. Chronic lacunar infarcts are noted in  the right corona radiata, left cerebellum, and left paramedian pons. Patchy T2 hyperintensities in the cerebral white matter and pons are nonspecific but compatible with moderately extensive chronic small vessel ischemic disease, similar to the prior MRI. There is mild cerebral atrophy. Vascular: Major intracranial vascular flow voids are preserved. Skull and upper cervical spine: Unremarkable bone marrow signal. Sinuses/Orbits: Unremarkable orbits. Trace left mastoid fluid. Minimal paranasal sinus mucosal thickening. Other: None. MRA HEAD FINDINGS The study is moderately motion degraded. There is near complete signal loss in both V4 segments which may be artifactual given the symmetry as well as preserved flow related enhancement in both distal V3 segments, however reduced flow from underlying stenoses is not excluded. Signal loss extends to the vertebrobasilar junction, partially obscuring the proximal most basilar artery. The remainder of the basilar artery is widely patent. There is mild fullness of the basilar tip which is favored to reflect the confluence of the PCA and SCA origins, without a saccular aneurysm identified. Posterior communicating arteries are present bilaterally, right larger than left. The right P1 segment is likely very hypoplastic, and there is milder hypoplasia of the left P1 segment. The P2 segments are grossly patent, however motion limits detailed assessment. The distal right cervical ICA is tortuous. The intracranial ICAs are widely patent. M1 segments and MCA bifurcations are grossly patent, however motion artifact results in signal loss particularly notable in the right greater than left proximal M2 branches which limits assessment. The ACAs are  patent with motion artifact through the A1 and proximal A2 segments limiting evaluation for stenosis. No large aneurysm is identified. IMPRESSION: 1. Small acute infarcts in the left occipital lobe and white matter of the left parietal and  right temporal lobes. 2. Multiple late subacute and chronic infarcts as above. 3. Extensive chronic small vessel ischemic disease. 4. Motion degraded head MRA without large vessel occlusion in the anterior circulation. Signal loss throughout both V4 segments through the vertebrobasilar junction is favored to be artifactual but results in nondiagnostic assessment of these vessels. Electronically Signed   By: Sebastian Ache M.D.   On: 12/13/2017 17:26   Mr Brain Wo Contrast  Result Date: 12/13/2017 CLINICAL DATA:  Syncopal episode today. EXAM: MRI HEAD WITHOUT CONTRAST MRA HEAD WITHOUT CONTRAST TECHNIQUE: Multiplanar, multiecho pulse sequences of the brain and surrounding structures were obtained without intravenous contrast. Angiographic images of the head were obtained using MRA technique without contrast. COMPARISON:  Head CT 12/13/2017.  Head MRI/MRA 10/29/2017. FINDINGS: MRI HEAD FINDINGS Brain: There is a 1.7 cm acute infarct in the posteromedial left occipital lobe. An acute curvilinear infarct is noted in the left parietal lobe extending from the periventricular to the subcortical white matter. There are also acute subcentimeter periventricular white matter infarcts in the right temporal lobe. A 2 mm focus of mild trace diffusion hyperintensity in the left corona radiata is new from the prior MRI but is without clearly reduced ADC to confirm an acute infarct though evaluation is limited by its small size. The small acute infarcts on the prior MRI are now largely late subacute in appearance with some early developing encephalomalacia. There is mild residual trace diffusion signal abnormality in the left centrum semiovale and anterior right external capsule corresponding to old infarcts which were present on the prior MRI. No intracranial hemorrhage, mass, midline shift, or extra-axial fluid collection is identified. Chronic lacunar infarcts are noted in the right corona radiata, left cerebellum, and left  paramedian pons. Patchy T2 hyperintensities in the cerebral white matter and pons are nonspecific but compatible with moderately extensive chronic small vessel ischemic disease, similar to the prior MRI. There is mild cerebral atrophy. Vascular: Major intracranial vascular flow voids are preserved. Skull and upper cervical spine: Unremarkable bone marrow signal. Sinuses/Orbits: Unremarkable orbits. Trace left mastoid fluid. Minimal paranasal sinus mucosal thickening. Other: None. MRA HEAD FINDINGS The study is moderately motion degraded. There is near complete signal loss in both V4 segments which may be artifactual given the symmetry as well as preserved flow related enhancement in both distal V3 segments, however reduced flow from underlying stenoses is not excluded. Signal loss extends to the vertebrobasilar junction, partially obscuring the proximal most basilar artery. The remainder of the basilar artery is widely patent. There is mild fullness of the basilar tip which is favored to reflect the confluence of the PCA and SCA origins, without a saccular aneurysm identified. Posterior communicating arteries are present bilaterally, right larger than left. The right P1 segment is likely very hypoplastic, and there is milder hypoplasia of the left P1 segment. The P2 segments are grossly patent, however motion limits detailed assessment. The distal right cervical ICA is tortuous. The intracranial ICAs are widely patent. M1 segments and MCA bifurcations are grossly patent, however motion artifact results in signal loss particularly notable in the right greater than left proximal M2 branches which limits assessment. The ACAs are patent with motion artifact through the A1 and proximal A2 segments limiting evaluation for stenosis. No large aneurysm  is identified. IMPRESSION: 1. Small acute infarcts in the left occipital lobe and white matter of the left parietal and right temporal lobes. 2. Multiple late subacute and  chronic infarcts as above. 3. Extensive chronic small vessel ischemic disease. 4. Motion degraded head MRA without large vessel occlusion in the anterior circulation. Signal loss throughout both V4 segments through the vertebrobasilar junction is favored to be artifactual but results in nondiagnostic assessment of these vessels. Electronically Signed   By: Sebastian Ache M.D.   On: 12/13/2017 17:26   Dg Chest Port 1 View  Result Date: 12/31/2017 CLINICAL DATA:  72 year old female with a history confusion EXAM: PORTABLE CHEST 1 VIEW COMPARISON:  10/29/2017, 03/02/2007 FINDINGS: New airspace opacity at the right lower lung. Cardiomediastinal silhouette unchanged in size and contour. No pneumothorax.  No pleural effusion. Degenerative changes of the shoulders.  No acute displaced fracture IMPRESSION: New airspace opacity at the right lung base suggesting lobar pneumonia. Followup PA and lateral chest X-ray is recommended in 3-4 weeks following trial of therapy to assure resolution. Electronically Signed   By: Gilmer Mor D.O.   On: 12/31/2017 14:23   [place] Ekg Site Rite  Result Date: 01/03/2018 If Site Rite image not attached, placement could not be confirmed due to current cardiac rhythm.    Assessment and Recommendation  72 y.o. female with essential hypertension mixed hyperlipidemia paroxysmal nonvalvular atrial fibrillation having methicillin sensitive Staphylococcus aureus bacteremia with an echocardiogram showing no evidence of endocarditis although may have possible endocarditis needing transesophageal echocardiogram.  Patient is not oriented enough today to give full consent and family not available so will defer at this time   1.  No further cardiac intervention at this time until further consent able to be obtained 2.  Continue amlodipine for hypertension control 3.  Eliquis for risk reduction of stroke with atrial fibrillation 4.  Further treatment options after above Signed, Arnoldo Hooker M.D.  FACC

## 2018-01-04 NOTE — Progress Notes (Signed)
Unable to sign consent for TEE at present as pt. Has altered mental status. Attempted to call duaghter [name]. Left message on phone. Dr. Gwen Pounds made aware. Daughter is pt. POA.

## 2018-01-04 NOTE — Care Management Note (Addendum)
Case Management Note  Patient Details  Name: Destiny Mack MRN: [id number] Date of Birth: [birth year]  Subjective/Objective:  Case discussed with Dr.Sainani. Patient will discharge today if TEE negative. Will need home IV antibiotics via PICC line.  Per [name] with [facility] patient not open to them. Referral to [facility] for RN, PT and SW. [name] made aware of IV antibiotic orders. Start of care will be at 8 am Saturday morning. Will need ambulance home. Medical necessity completed. Primary nurse updated. TC to daughter, [name], updated her on POC. She is agreeable. Made her aware that some one from [facility] would be coming out in the am to statrt IV antibiotics                       Action/Plan: [facility] for IV antibiotics, RN, PT and SW.   Expected Discharge Date:  01/02/18               Expected Discharge Plan:  Home w Home Health Services  In-House Referral:     Discharge planning Services  CM Consult  Post Acute Care Choice:  Home Health, Resumption of Svcs/PTA Provider Choice offered to:     DME Arranged:    DME Agency:     HH Arranged:  RN, PT, Social Work [employer] Agency:  [facility]  Status of Service:  Completed, signed off  If discussed at [employer], dates discussed:    Additional Comments:  [name], RN 01/04/2018, 3:14 PM

## 2018-01-04 NOTE — Progress Notes (Signed)
Infectious Disease Long Term IV Antibiotic Orders Destiny Mack August 06, 1946  Diagnosis: MSSA bacteremia  Culture results Specimen Description BLOOD 1 LEFT WRIST  Performed at [facility], [address] [place], [place] [address]     Special Requests BOTTLES DRAWN AEROBIC AND ANAEROBIC  Performed at [facility], [address] [place], [place] [address]     Culture Setup Time GRAM POSITIVE COCCI  AEROBIC BOTTLE ONLY  CRITICAL RESULT CALLED TO, READ BACK BY AND VERIFIED WITH:  DAVID BESANTI AT 1308 01/01/18 SDR  Performed at [facility], [address] [place], [place] [address]     Culture STAPHYLOCOCCUS AUREUSAbnormal    Report Status 01/03/2018 FINAL   Organism ID, Bacteria STAPHYLOCOCCUS AUREUS   Resulting Agency [facility]  Susceptibility    Staphylococcus aureus    MIC    CIPROFLOXACIN <=0.5 SENSI... Sensitive    CLINDAMYCIN <=0.25 SENS... Sensitive    ERYTHROMYCIN <=0.25 SENS... Sensitive    GENTAMICIN <=0.5 SENSI... Sensitive    Inducible Clindamycin NEGATIVE  Sensitive    OXACILLIN 0.5 SENSITIVE  Sensitive    RIFAMPIN <=0.5 SENSI... Sensitive    TETRACYCLINE <=1 SENSITIVE  Sensitive    TRIMETH/SULFA <=10 SENSIT... Sensitive    VANCOMYCIN 1 SENSITIVE  Sensitive         Susceptibility Comments       LABS Lab Results  Component Value Date   CREATININE 1.20 (H) 01/04/2018   Lab Results  Component Value Date   WBC 9.5 01/04/2018   HGB 10.2 (L) 01/04/2018   HCT 30.6 (L) 01/04/2018   MCV 90.0 01/04/2018   PLT 250 01/04/2018   No results found for: ESRSEDRATE, POCTSEDRATE No results found for: CRP  Allergies: No Known Allergies  Discharge antibiotics Cefazolin      2 grams every  12 hours   PICC Care per protocol Labs weekly while on IV antibiotics -FAX weekly labs to [phone] CBC w diff   cr   Planned duration of antibiotics 2 wk  Stop date May 22   Follow up clinic date7- 10 days   Mick Sell, MD

## 2018-01-04 NOTE — Progress Notes (Signed)
PHARMACY CONSULT NOTE FOR:  OUTPATIENT  PARENTERAL ANTIBIOTIC THERAPY (OPAT)  Indication: Bacteremia  Regimen: Cefazolin 2g IV q12h x 14 days  End date: May 24   IV antibiotic discharge orders are pended. To discharging provider:  please sign these orders via discharge navigator,  Select New Orders & click on the button choice - Manage This Unsigned Work.     Thank you for allowing pharmacy to be a part of this patient's care.  Cleopatra Cedar, PharmD Pharmacy Resident  01/04/2018, 3:16 PM

## 2018-01-04 NOTE — Consult Note (Signed)
Pharmacy Antibiotic Note  Destiny Mack is a 72 y.o. female admitted on 12/31/2017 with bacteremia.  Pharmacy has been consulted for cefazolin dosing.  Plan: Pt currently on  cefazolin 2g q 12 hr. Renal function has improved. Increase to cefazolin 2 g IV q8h   Height:  (162.6 cm) Weight: 135 lb (61.2 kg) IBW/kg (Calculated) : 54.7  Temp (24hrs), Avg:98.1 F (36.7 C), Min:98 F (36.7 C), Max:98.1 F (36.7 C)  Recent Labs  Lab 12/31/17 1357 12/31/17 1703 01/01/18 0735 01/02/18 0540 01/04/18 0604  WBC 11.6*  --  7.1  --  9.5  CREATININE 1.90*  --  1.75* 1.68* 1.20*  LATICACIDVEN 1.1 0.7  --   --   --     Estimated Creatinine Clearance: 36.6 mL/min (A) (by C-G formula based on SCr of 1.2 mg/dL (H)).    No Known Allergies  Antimicrobials this admission: cefepime 5/6 >> 5/7 vancomycin 5/6 >> 5/7 Cefazolin 5/7>>  Dose adjustments this admission:   Microbiology results: 5/6 BCx: 1/2 MSSA 5/6 UCx: NG  5/7 BCx x1 NGTD   Thank you for allowing pharmacy to be a part of this patient's care.  Crist Fat, PharmD, BCPS Clinical Pharmacist 01/04/2018 1:46 PM

## 2018-01-05 ENCOUNTER — Encounter: Payer: Self-pay | Admitting: Internal Medicine

## 2018-01-05 LAB — CULTURE, BLOOD (ROUTINE X 2): Culture: NO GROWTH

## 2018-01-06 LAB — CULTURE, BLOOD (SINGLE)
CULTURE: NO GROWTH
Special Requests: ADEQUATE

## 2018-01-09 ENCOUNTER — Emergency Department
Admission: EM | Admit: 2018-01-09 | Discharge: 2018-01-11 | Disposition: A | Discharge status: Home / Self Care | Payer: Medicare HMO | Attending: Emergency Medicine | Admitting: Emergency Medicine

## 2018-01-09 ENCOUNTER — Other Ambulatory Visit: Payer: Self-pay

## 2018-01-09 DIAGNOSIS — I10 Essential (primary) hypertension: Secondary | ICD-10-CM | POA: Insufficient documentation

## 2018-01-09 DIAGNOSIS — Z0000 Encounter for general adult medical examination without abnormal findings: Secondary | ICD-10-CM

## 2018-01-09 DIAGNOSIS — Z9181 History of falling: Secondary | ICD-10-CM | POA: Insufficient documentation

## 2018-01-09 DIAGNOSIS — Z8673 Personal history of transient ischemic attack (TIA), and cerebral infarction without residual deficits: Secondary | ICD-10-CM | POA: Insufficient documentation

## 2018-01-09 DIAGNOSIS — Z742 Need for assistance at home and no other household member able to render care: Secondary | ICD-10-CM | POA: Diagnosis present

## 2018-01-09 DIAGNOSIS — Z79899 Other long term (current) drug therapy: Secondary | ICD-10-CM | POA: Diagnosis not present

## 2018-01-09 DIAGNOSIS — Z7901 Long term (current) use of anticoagulants: Secondary | ICD-10-CM | POA: Insufficient documentation

## 2018-01-09 DIAGNOSIS — F17210 Nicotine dependence, cigarettes, uncomplicated: Secondary | ICD-10-CM | POA: Diagnosis not present

## 2018-01-09 DIAGNOSIS — M6281 Muscle weakness (generalized): Secondary | ICD-10-CM | POA: Diagnosis not present

## 2018-01-09 LAB — URINALYSIS, ROUTINE W REFLEX MICROSCOPIC
Bacteria, UA: NONE SEEN
Bilirubin Urine: NEGATIVE
Glucose, UA: NEGATIVE mg/dL
Ketones, ur: NEGATIVE mg/dL
Nitrite: NEGATIVE
Protein, ur: NEGATIVE mg/dL
Specific Gravity, Urine: 1.01 (ref 1.005–1.030)
pH: 7 (ref 5.0–8.0)

## 2018-01-09 LAB — CBC
HCT: 33 % — ABNORMAL LOW (ref 35.0–47.0)
HEMOGLOBIN: 11 g/dL — AB (ref 12.0–16.0)
MCH: 30.1 pg (ref 26.0–34.0)
MCHC: 33.5 g/dL (ref 32.0–36.0)
MCV: 90 fL (ref 80.0–100.0)
Platelets: 211 10*3/uL (ref 150–440)
RBC: 3.66 MIL/uL — AB (ref 3.80–5.20)
RDW: 16.7 % — ABNORMAL HIGH (ref 11.5–14.5)
WBC: 9.2 10*3/uL (ref 3.6–11.0)

## 2018-01-09 LAB — BASIC METABOLIC PANEL
Anion gap: 7 (ref 5–15)
BUN: 18 mg/dL (ref 6–20)
CHLORIDE: 107 mmol/L (ref 101–111)
CO2: 23 mmol/L (ref 22–32)
Calcium: 7.8 mg/dL — ABNORMAL LOW (ref 8.9–10.3)
Creatinine, Ser: 1.22 mg/dL — ABNORMAL HIGH (ref 0.44–1.00)
GFR calc Af Amer: 50 mL/min — ABNORMAL LOW (ref >60)
GFR calc non Af Amer: 43 mL/min — ABNORMAL LOW (ref >60)
GLUCOSE: 80 mg/dL (ref 65–99)
Potassium: 3.7 mmol/L (ref 3.5–5.1)
Sodium: 137 mmol/L (ref 135–145)

## 2018-01-09 MED ORDER — FOSFOMYCIN TROMETHAMINE 3 G PO PACK
3.0000 g | PACK | Freq: Once | ORAL | Status: AC
  Administered 2018-01-09: 3 g via ORAL
  Filled 2018-01-09: qty 3

## 2018-01-09 NOTE — ED Notes (Signed)
Pt cleaned up and given peri care, new diaper applied

## 2018-01-09 NOTE — Progress Notes (Signed)
LCSW consulted with EDP and he will put in a care management consult for home care.   Yolonda Purtle LCSW

## 2018-01-09 NOTE — ED Notes (Signed)
Pt offered sandwich tray which she denied, requested crackers which was given. Pt denies wanting a drink.

## 2018-01-09 NOTE — ED Triage Notes (Addendum)
Pt arrived via EMS from home with complaint from family of needing an evaluation. Family cannot take care of pt properly. Pt has no complaints of pain at this time. Pt is alert and oriented x 4. Pt was admitted to the hospital on the 5th for pneumonia and was given antibiotics. VS per EMS O2sat-98% RA BP-145/71 HR-84. Pt DNR at bedside. Pt has PICC in left inner arm

## 2018-01-09 NOTE — ED Notes (Signed)
Pt voided, pt given peri care and new diaper applied.

## 2018-01-09 NOTE — ED Notes (Signed)
Pt voided, given peri care and changed diaper. Pt states she is going to fall back asleep, lights dimmed, pt in NAD at this time.

## 2018-01-09 NOTE — ED Notes (Signed)
Pt gave permission for this RN to contact pt's daughter Alroy Dust and give an update: [phone]
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
## 2018-01-09 NOTE — ED Notes (Signed)
Pt states she is here "because my daughter can't take care of me anymore." Pt states she wants to go back home but states "I guess I don't have that choice." Pt states her daughter has been taking care of her, per pt "she cooks and cleans and fusses at me." Pt states last time she was changed was yesterday. Pt states her and her daughter share the home. Pt denies any medical complaints at this time. Pt A&Ox4, able to answer questions. Pt states she was admitted for pneumonia on the 5th and got IV antibiotics via PICC line in left upper arm which is still present.

## 2018-01-10 NOTE — Evaluation (Signed)
Physical Therapy Evaluation Patient Details Name: Destiny Mack MRN: [id number] DOB: [birth year] Today's Date: 01/10/2018   History of Present Illness  Pt is a 72 y/o F who arrived via EMS reporting, "my daughter can't take care of me anymore".  Pt was recently admitted for PNA on 5/5 and has PICC line in LUE.  Pt's PMH includes tremor of hands and face, stroke, mixed incontinence, a-fib, depression.    Clinical Impression  Pt admitted with above diagnosis. Pt currently with functional limitations due to the deficits listed below (see PT Problem List). Destiny Mack presents with generalized weakness and fatigues quickly with mobility.  However, she puts forth good effort.  She does require min assist with bed mobility, sit<>stand transfers, and mod assist to ambulate around bed using RW.  Pt endorses ~8-9 falls in the past 6 months and at a high risk of falling due to weakness and poor balance. Pt's daughter is no longer able to provide assist for the pt and the pt denies any other family or friends who can provide assist.  Given pt's current mobility status, recommending SNF at d/c.   Pt will benefit from skilled PT to increase their independence and safety with mobility to allow discharge to the venue listed below.      Follow Up Recommendations SNF    Equipment Recommendations  Rolling walker with 5" wheels(pt reporting that she does not have RW)    Recommendations for Other Services       Precautions / Restrictions Precautions Precautions: Fall Restrictions Weight Bearing Restrictions: No      Mobility  Bed Mobility Overal bed mobility: Needs Assistance Bed Mobility: Supine to Sit;Sit to Supine     Supine to sit: Min assist Sit to supine: Min assist   General bed mobility comments: Pt requires assist to guide LEs to EOB and to elevate trunk for supine>sit.  Pt demonstrates a posterior lean.  Assist provided to bring LEs back into bed for sit>supine.   Transfers Overall transfer  level: Needs assistance Equipment used: Rolling walker (2 wheeled) Transfers: Sit to/from Stand Sit to Stand: Min assist         General transfer comment: Pt requires min assist to boost to standing and to remain steady.  Pt performed sit>stand from bed x2.  Pt with poorly controlled descent to sit.   Ambulation/Gait Ambulation/Gait assistance: Mod assist Ambulation Distance (Feet): 15 Feet Assistive device: Rolling walker (2 wheeled) Gait Pattern/deviations: Decreased step length - left;Decreased step length - right Gait velocity: decreased   General Gait Details: Pt ambulated around the bed with min>mod assist to remain steady.  Pt repetitively reaching out to hold onto bed and required max verbal cues to keep both hands on RW for stability.    Stairs            Wheelchair Mobility    Modified Rankin (Stroke Patients Only)       Balance Overall balance assessment: Needs assistance Sitting-balance support: Feet unsupported;Single extremity supported Sitting balance-Leahy Scale: Poor Sitting balance - Comments: Pt relied on at least 1UE support for static sitting EOB Postural control: Posterior lean Standing balance support: Bilateral upper extremity supported;During functional activity Standing balance-Leahy Scale: Poor Standing balance comment: Pt relies on BUE support for static and dynamic activities                             Pertinent Vitals/Pain Pain Assessment: No/denies pain  Home Living Family/patient expects to be discharged to:: Private residence Living Arrangements: Children Available Help at Discharge: Other (Comment)(daughter no longer able to provide assist) Type of Home: Apartment Home Access: Level entry     Home Layout: Two level Home Equipment: Bedside commode;Wheelchair - manual;Hospital bed(pt denies having a RW)      Prior Function Level of Independence: Needs assistance   Gait / Transfers Assistance Needed: Pt  performs stand pivot transfers only, at times independently, at other times she has assist from her daughter.  Pt reports ~8-9 falls in the past 6 months.  Pt denies ambulating at baseline but does endorse working with HHPT and ambulated a very short distance with HHPT 1 wk prior.    ADL's / Homemaking Assistance Needed: Daughter provides assist for sponge bathing, dressing.  Daughter does the cooking, cleaning.  Neighbor does the grocery shopping as daughter does not drive.          Hand Dominance        Extremity/Trunk Assessment   Upper Extremity Assessment Upper Extremity Assessment: Generalized weakness(Strength grossly 3/5 BUE)    Lower Extremity Assessment Lower Extremity Assessment: Generalized weakness(Strength grossly 3+/5 BLE)       Communication   Communication: No difficulties  Cognition Arousal/Alertness: Awake/alert Behavior During Therapy: WFL for tasks assessed/performed;Flat affect Overall Cognitive Status: No family/caregiver present to determine baseline cognitive functioning                                 General Comments: Pt with inconsistent answers to questions.        General Comments General comments (skin integrity, edema, etc.): SpO2 96% with supine>sit on RA.  Pt reports feeling feeling lightheaded after ambulating but then says "I have felt this way all morning".  BP taken in supine 146/63 and SpO2 99% on RA.     Exercises     Assessment/Plan    PT Assessment Patient needs continued PT services  PT Problem List Decreased strength;Decreased activity tolerance;Decreased balance;Decreased mobility;Decreased cognition;Decreased knowledge of use of DME;Decreased safety awareness       PT Treatment Interventions DME instruction;Gait training;Functional mobility training;Therapeutic activities;Therapeutic exercise;Balance training;Neuromuscular re-education;Cognitive remediation;Patient/family education;Wheelchair mobility training     PT Goals (Current goals can be found in the Care Plan section)  Acute Rehab PT Goals Patient Stated Goal: to go home with assist PT Goal Formulation: With patient Time For Goal Achievement: 01/24/18 Potential to Achieve Goals: Fair    Frequency Min 2X/week   Barriers to discharge Decreased caregiver support No assist available from family or friends    Co-evaluation               AM-PAC PT "6 Clicks" Daily Activity  Outcome Measure Difficulty turning over in bed (including adjusting bedclothes, sheets and blankets)?: Unable Difficulty moving from lying on back to sitting on the side of the bed? : Unable Difficulty sitting down on and standing up from a chair with arms (e.g., wheelchair, bedside commode, etc,.)?: Unable Help needed moving to and from a bed to chair (including a wheelchair)?: A Little Help needed walking in hospital room?: A Lot Help needed climbing 3-5 steps with a railing? : Total 6 Click Score: 9    End of Session Equipment Utilized During Treatment: Gait belt Activity Tolerance: Patient limited by fatigue Patient left: in bed;with call bell/phone within reach Nurse Communication: Mobility status;Other (comment)(pt needs to be cleaned, +BM) PT Visit  Diagnosis: Muscle weakness (generalized) (M62.81);History of falling (Z91.81);Difficulty in walking, not elsewhere classified (R26.2);Unsteadiness on feet (R26.81)    Time: 0981-1914 PT Time Calculation (min) (ACUTE ONLY): 17 min   Charges:   PT Evaluation $PT Eval Low Complexity: 1 Low PT Treatments $Gait Training: 8-22 mins   PT G Codes:        Encarnacion Chu PT, DPT 01/10/2018, 9:42 AM

## 2018-01-10 NOTE — Progress Notes (Addendum)
Patient has agreed to go to [employer]. Faxing over clinicals to [facility] to get authorization .Awaiting Call back left voice mail message.  [employer] LCSW [phone]

## 2018-01-10 NOTE — ED Notes (Signed)
Pt states she prefers to have blanket covering head when she sleeps.

## 2018-01-10 NOTE — ED Notes (Signed)
Social Services and the Social worker are working on placement at this time.

## 2018-01-10 NOTE — NC FL2 (Signed)
  [place] LEVEL OF CARE SCREENING TOOL     IDENTIFICATION  Patient Name: Destiny Mack Birthdate: [birth year] Sex: female Admission Date (Current Location): 01/09/2018  [facility] and [place] Number:  Engineering geologist and Address:  [facility], [address] [place], [place] [address]      Provider Number: [id number]  Attending Physician Name and Address:  No att. providers found  Relative Name and Phone Number:       Current Level of Care: Hospital Recommended Level of Care: [place] Prior Approval Number:   Date Approved/Denied:   PASRR Number:     [id number]  Discharge Plan: SNF    Current Diagnoses: Patient Active Problem List   Diagnosis Date Noted  . Malnutrition of moderate degree 01/01/2018  . Pneumonia 12/31/2017  . Hypotension 12/13/2017  . Diarrhea 11/22/2017  . Campylobacter diarrhea 11/22/2017  . Acute encephalopathy 10/29/2017  . Pressure injury of skin 10/29/2017    Orientation RESPIRATION BLADDER Height & Weight     Self, Time, Situation, Place  Normal Incontinent Weight: 170 lb (77.1 kg) Height:  '5\' 4"'$  (162.6 cm)  BEHAVIORAL SYMPTOMS/MOOD NEUROLOGICAL BOWEL NUTRITION STATUS      Incontinent Diet(Normal)  AMBULATORY STATUS COMMUNICATION OF NEEDS Skin   Limited Assist Verbally Normal                       Personal Care Assistance Level of Assistance  Bathing, Feeding, Dressing, Total care Bathing Assistance: Limited assistance Feeding assistance: Independent Dressing Assistance: Limited assistance Total Care Assistance: Limited assistance   Functional Limitations Info  Sight, Hearing, Speech Sight Info: Adequate Hearing Info: Adequate Speech Info: Adequate    SPECIAL CARE FACTORS FREQUENCY  PT (By licensed PT), OT (By licensed OT)     PT Frequency: x5 OT Frequency: x5            Contractures Contractures Info: Not present    Additional Factors Info                  Current Medications (01/10/2018):  This is the current hospital active medication list No current facility-administered medications for this encounter.    Current Outpatient Medications  Medication Sig Dispense Refill  . amLODipine (NORVASC) 5 MG tablet Take 5 mg by mouth daily.    Marland Kitchen apixaban (ELIQUIS) 5 MG TABS tablet Take 1 tablet (5 mg total) by mouth 2 (two) times daily. 60 tablet 1  . atorvastatin (LIPITOR) 20 MG tablet Take 20 mg by mouth at bedtime.     Marland Kitchen ceFAZolin (ANCEF) 2-4 GM/100ML-% IVPB Inject 100 mLs (2 g total) into the vein every 8 (eight) hours for 14 days. 1 each   . ceFAZolin (ANCEF) IVPB Inject 2 g into the vein every 12 (twelve) hours for 14 days. Indication:  Bacteremia Last Day of Therapy:  May 24 Labs - Once weekly:  CBC/D and BMP, Labs - Every other week:  ESR and CRP 28 Units 0  . lisinopril (PRINIVIL,ZESTRIL) 30 MG tablet Take 30 mg by mouth daily.       Discharge Medications: Please see discharge summary for a list of discharge medications.  Relevant Imaging Results:  Relevant Lab Results:   Additional Information [id number], [place], [place]

## 2018-01-10 NOTE — ED Notes (Signed)
Pt had stool on sheets and hands. Pt asked why she had stool on her hands and had no explanation. Pt changed into clean brief and clean linen.

## 2018-01-10 NOTE — ED Notes (Signed)
Pt at 75% of lunch meal tray.

## 2018-01-10 NOTE — ED Notes (Addendum)
Pt provided with dinner tray. Pt states she doesn't want to eat right now and wants to rest.

## 2018-01-10 NOTE — Progress Notes (Signed)
LCSW awaiting authorization from [employer]-This process may take 24 hours EDP and EDRN informed. Patient is  agreeable with this plan.  Once bed offer made patient will require transport by EMS  Destiny Quillin LCSW

## 2018-01-10 NOTE — Progress Notes (Signed)
Destiny Mack APS social worker came to visit with patient. She reports that APS has involved for the last 2 weeks. DSS became involved with daughter Treatment compliance case and felt mother needed additional supports in home.  As per APS worker this patient has capacity.  Patient has in home health care but SW from DSS that will arrange home health aid.  LCSW reviewed facilities that could accept patient.  [employer] LCSW [phone]
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
## 2018-01-10 NOTE — ED Notes (Signed)
Pt sleeping with blanket over her head. Call light within reach. Will continue to monitor.

## 2018-01-10 NOTE — Progress Notes (Signed)
LCSW received call from PT and they are recommending SNF for patient.   [employer] LCSW [phone]

## 2018-01-10 NOTE — ED Notes (Signed)
Pt changed at this time.  Pt put on external urinary catheter, hooked to wall suction at 45.  Pt resting comfortably at this time.

## 2018-01-10 NOTE — Care Management (Addendum)
RNCM was notified of patient needing RNCM assistance as per CSW patient requests to return home.  Patient has had multiple presentations to hospital. She does not appear to meet criteria for [facility]/outpatient CM.  CSW shared with me that daughter may have medical issues also however daughter did share that "she was not going to wipe her/patient's butt any longer".  Per last discharge note, patient went home followed by [facility] for home IV antibiotics.  Message has been sent to [facility] with [facility] to check this status.  Per PT note 01/03/18:   Pt showed good effort and willingness to participate with PT but ultimately was very unsafe with standing/ambulation.  She veered to the R with walker t/o the 15 ft of ambulation and leaned heavily to the L with 2 significant LOBs needing PT to keep her upright.  She reports she spends most of her day in w/c that she can self propel even since most recent CVA. Pt is limited but with home assist should be able to go home with HHPT.   Update from home health: APS report has been made.  HHRN found patient to be covered in urine.  Cigarette buns in her bed sheets. [facility] may not be able to take this patient back due to living conditions and caregiver limitations. This patient discharged to home with IV PICC in place however I understood  home health agency to say that daughter was delivering IV antibiotics to patient but not willing to keep patient clean urine/stool. Patient is bedbound-smokes.  This RNCM has reached out to APS to let them know patient is back in ED-message left on hotline.

## 2018-01-10 NOTE — ED Provider Notes (Signed)
-----------------------------------------   6:37 AM on 01/10/2018 -----------------------------------------   Blood pressure 131/78, pulse 82, temperature 98.5 F (36.9 C), resp. rate 16, height 1.626 m ( ), weight 77.1 kg (170 lb), SpO2 99 %.  The patient had no acute events since last update.  Calm and cooperative at this time.  Disposition is pending PT consult and social work assistance.     Loleta Rose, MD 01/10/18 [phone]

## 2018-01-11 LAB — URINE CULTURE: CULTURE: NO GROWTH

## 2018-01-11 MED ORDER — CEFAZOLIN SODIUM-DEXTROSE 2-4 GM/100ML-% IV SOLN
2.0000 g | Freq: Two times a day (BID) | INTRAVENOUS | Status: DC
  Administered 2018-01-11 (×2): 2 g via INTRAVENOUS
  Filled 2018-01-11 (×4): qty 100

## 2018-01-11 NOTE — ED Provider Notes (Addendum)
Our astute nurse Candise Bowens has noted that the patient has a PICC line for unclear reasons.  On chart review the patient has MSSA bacteremia and is supposed to be getting 2 g of Ancef every 12 hours for a total of 2 weeks.  We will restart her antibiotics now.   Merrily Brittle, MD 01/11/18 0131  ----------------------------------------- 5:50 AM on 01/11/2018 -----------------------------------------   Blood pressure (!) 164/75, pulse 64, temperature 97.8 F (36.6 C), temperature source Oral, resp. rate 17, height  (1.626 m), weight 77.1 kg (170 lb), SpO2 99 %.  The patient had no acute events since last update.  Calm and cooperative at this time.  Disposition is pending Psychiatry/Behavioral Medicine team recommendations.      Merrily Brittle, MD 01/11/18 [phone]

## 2018-01-11 NOTE — Care Management (Signed)
RNCM received call from APS and report was made by this RNCM.

## 2018-01-11 NOTE — ED Notes (Signed)
Pt completed 50% of breakfast tray at this time.

## 2018-01-11 NOTE — ED Notes (Signed)
Daughter contacted and informed that patient is being transported via ems

## 2018-01-11 NOTE — ED Notes (Signed)
RN spoke to pts daughter and updated her on the discharge and care situation. Pt updated that daughter was updated. Pt in agreance. Daughter also reports pt does not have a walker at home. RN will send a walker home with patient.

## 2018-01-11 NOTE — ED Notes (Signed)
Daughter [phone] secondary phone

## 2018-01-11 NOTE — Progress Notes (Signed)
LCSW consulted with EDP and EDRN and ED Secretary that patient is to be transported by EMS and will have in home PT and follow up care with [facility].  Consulted with care manager about patients plan of care. No further needs.  Feige Lowdermilk LCSW [phone]

## 2018-01-11 NOTE — ED Provider Notes (Signed)
 -----------------------------------------   9:28 AM on 01/11/2018 -----------------------------------------  Case discussed with the social worker after her evaluation of the patient. Patient has been offered skilled nursing facility placement in the past but refuses. Patient does not allow one to be placed today and wants to go home. She has medical decision-making capacity and her tonic we will be respected. She already has home health services by [facility] set up for maintenance of her PICC line and IV antibiotics at home.   She will require continued home health support.  she is receiving her scheduled dose of IV antibiotics at this time after which she'll be stable for discharge home.   Sharman Cheek, MD 01/11/18 [phone]

## 2018-01-11 NOTE — Care Management (Signed)
RNCM received notification from CSW in ED that ADCSW has suggested that patient return to home with skilled care. I have notified jason with [facility]. EMS will be needed to home. Medicaid/financial counselor is supposed to see patient regarding Medicaid for long-term placement. I understand that patient does not currently have a payer for long term care. APS notified of discharge back to home today.

## 2018-01-11 NOTE — ED Notes (Signed)
PT changed and cleaned. New pad paced on pt. Pt refused to have teeth brushed and refused needing a bath. PT in NAD at this time and was able to eat 90% of lunch independently. EMS has been notified for pts transfer home.

## 2018-01-11 NOTE — Progress Notes (Signed)
LCSW called and DSS doing her medicaid LTC application once she is done she will transport home  [employer] LCSW [phone]

## 2018-01-11 NOTE — ED Notes (Signed)
Pt sitting in bed and eating her breakfast with no difficulty. Pt drinking grape juice with the TV on at this time. NAD noted.

## 2018-01-11 NOTE — Progress Notes (Signed)
LCSW refaxed authorization to [facility]- awaiting authorization. Patient is agreeable to go to [facility].  [employer] LCSW [phone]

## 2018-01-11 NOTE — Discharge Instructions (Signed)
Continue to follow up with your doctor and home health providers.

## 2018-01-11 NOTE — ED Notes (Signed)
RN called and updated daughter completely on care at home after discharge. Daughter verbalized understanding and has agreed with this plan.

## 2018-01-11 NOTE — ED Notes (Addendum)
Note entered in error

## 2018-01-11 NOTE — Progress Notes (Signed)
LCSW consulted with Destiny Mack and patient is to DC home as she has capacity and we are respecting her wishes. LCSW will call care manager to DC patient home with home health.  [employer] LCSW [phone]

## 2018-01-11 NOTE — Progress Notes (Signed)
LCSW spoke with patient and she appeared to be pleased that she will return home. She is agreeable with this plan. Patient will be transported by EMS once her antibiotics are completed.   Care manager will make arrangements with [facility] and that patient has Pic IV for antibiotics.

## 2018-05-20 ENCOUNTER — Inpatient Hospital Stay
Admission: EM | Admit: 2018-05-20 | Discharge: 2018-05-21 | DRG: 689 | Disposition: A | Discharge status: Home Health | Payer: Medicare Other | Attending: Internal Medicine | Admitting: Internal Medicine

## 2018-05-20 ENCOUNTER — Emergency Department: Payer: Medicare Other

## 2018-05-20 ENCOUNTER — Other Ambulatory Visit: Payer: Self-pay

## 2018-05-20 ENCOUNTER — Inpatient Hospital Stay: Payer: Medicare Other

## 2018-05-20 ENCOUNTER — Encounter: Payer: Self-pay | Admitting: Emergency Medicine

## 2018-05-20 DIAGNOSIS — I129 Hypertensive chronic kidney disease with stage 1 through stage 4 chronic kidney disease, or unspecified chronic kidney disease: Secondary | ICD-10-CM | POA: Diagnosis present

## 2018-05-20 DIAGNOSIS — Z7901 Long term (current) use of anticoagulants: Secondary | ICD-10-CM | POA: Diagnosis not present

## 2018-05-20 DIAGNOSIS — Z23 Encounter for immunization: Secondary | ICD-10-CM | POA: Diagnosis not present

## 2018-05-20 DIAGNOSIS — R05 Cough: Secondary | ICD-10-CM

## 2018-05-20 DIAGNOSIS — N183 Chronic kidney disease, stage 3 (moderate): Secondary | ICD-10-CM | POA: Diagnosis present

## 2018-05-20 DIAGNOSIS — Z79899 Other long term (current) drug therapy: Secondary | ICD-10-CM | POA: Diagnosis not present

## 2018-05-20 DIAGNOSIS — N3946 Mixed incontinence: Secondary | ICD-10-CM | POA: Diagnosis present

## 2018-05-20 DIAGNOSIS — Z7401 Bed confinement status: Secondary | ICD-10-CM

## 2018-05-20 DIAGNOSIS — Z716 Tobacco abuse counseling: Secondary | ICD-10-CM

## 2018-05-20 DIAGNOSIS — F17210 Nicotine dependence, cigarettes, uncomplicated: Secondary | ICD-10-CM | POA: Diagnosis present

## 2018-05-20 DIAGNOSIS — I6623 Occlusion and stenosis of bilateral posterior cerebral arteries: Secondary | ICD-10-CM | POA: Diagnosis not present

## 2018-05-20 DIAGNOSIS — N179 Acute kidney failure, unspecified: Secondary | ICD-10-CM | POA: Diagnosis not present

## 2018-05-20 DIAGNOSIS — E860 Dehydration: Secondary | ICD-10-CM | POA: Diagnosis not present

## 2018-05-20 DIAGNOSIS — R251 Tremor, unspecified: Secondary | ICD-10-CM | POA: Diagnosis present

## 2018-05-20 DIAGNOSIS — I482 Chronic atrial fibrillation: Secondary | ICD-10-CM | POA: Diagnosis present

## 2018-05-20 DIAGNOSIS — I6529 Occlusion and stenosis of unspecified carotid artery: Secondary | ICD-10-CM | POA: Diagnosis present

## 2018-05-20 DIAGNOSIS — F329 Major depressive disorder, single episode, unspecified: Secondary | ICD-10-CM | POA: Diagnosis not present

## 2018-05-20 DIAGNOSIS — R296 Repeated falls: Secondary | ICD-10-CM | POA: Diagnosis not present

## 2018-05-20 DIAGNOSIS — I639 Cerebral infarction, unspecified: Secondary | ICD-10-CM

## 2018-05-20 DIAGNOSIS — Z8249 Family history of ischemic heart disease and other diseases of the circulatory system: Secondary | ICD-10-CM

## 2018-05-20 DIAGNOSIS — G9341 Metabolic encephalopathy: Secondary | ICD-10-CM | POA: Diagnosis present

## 2018-05-20 DIAGNOSIS — R059 Cough, unspecified: Secondary | ICD-10-CM

## 2018-05-20 DIAGNOSIS — N390 Urinary tract infection, site not specified: Secondary | ICD-10-CM | POA: Diagnosis not present

## 2018-05-20 LAB — CBC WITH DIFFERENTIAL/PLATELET
BASOS PCT: 1 %
Basophils Absolute: 0.1 10*3/uL (ref 0–0.1)
EOS ABS: 0.1 10*3/uL (ref 0–0.7)
EOS PCT: 2 %
HCT: 37.7 % (ref 35.0–47.0)
Hemoglobin: 12.4 g/dL (ref 12.0–16.0)
LYMPHS ABS: 1.2 10*3/uL (ref 1.0–3.6)
Lymphocytes Relative: 14 %
MCH: 31.7 pg (ref 26.0–34.0)
MCHC: 33 g/dL (ref 32.0–36.0)
MCV: 96.1 fL (ref 80.0–100.0)
MONO ABS: 0.7 10*3/uL (ref 0.2–0.9)
MONOS PCT: 8 %
Neutro Abs: 6.8 10*3/uL — ABNORMAL HIGH (ref 1.4–6.5)
Neutrophils Relative %: 75 %
PLATELETS: 174 10*3/uL (ref 150–440)
RBC: 3.93 MIL/uL (ref 3.80–5.20)
RDW: 14.8 % — AB (ref 11.5–14.5)
WBC: 8.9 10*3/uL (ref 3.6–11.0)

## 2018-05-20 LAB — URINALYSIS, COMPLETE (UACMP) WITH MICROSCOPIC
BILIRUBIN URINE: NEGATIVE
Glucose, UA: NEGATIVE mg/dL
KETONES UR: NEGATIVE mg/dL
Nitrite: POSITIVE — AB
PH: 7 (ref 5.0–8.0)
Protein, ur: 100 mg/dL — AB
RBC / HPF: >50 RBC/hpf — ABNORMAL HIGH (ref 0–5)
Specific Gravity, Urine: 1.014 (ref 1.005–1.030)

## 2018-05-20 LAB — BASIC METABOLIC PANEL
Anion gap: 7 (ref 5–15)
BUN: 39 mg/dL — AB (ref 8–23)
CALCIUM: 9.2 mg/dL (ref 8.9–10.3)
CO2: 18 mmol/L — AB (ref 22–32)
CREATININE: 1.74 mg/dL — AB (ref 0.44–1.00)
Chloride: 115 mmol/L — ABNORMAL HIGH (ref 98–111)
GFR calc non Af Amer: 28 mL/min — ABNORMAL LOW (ref >60)
GFR, EST AFRICAN AMERICAN: 33 mL/min — AB (ref >60)
Glucose, Bld: 115 mg/dL — ABNORMAL HIGH (ref 70–99)
Potassium: 4 mmol/L (ref 3.5–5.1)
Sodium: 140 mmol/L (ref 135–145)

## 2018-05-20 MED ORDER — INFLUENZA VAC SPLIT HIGH-DOSE 0.5 ML IM SUSY
0.5000 mL | PREFILLED_SYRINGE | INTRAMUSCULAR | Status: DC
  Filled 2018-05-20: qty 0.5

## 2018-05-20 MED ORDER — SODIUM CHLORIDE 0.9 % IV SOLN
1.0000 g | INTRAVENOUS | Status: DC
  Administered 2018-05-20: 1 g via INTRAVENOUS
  Filled 2018-05-20: qty 10
  Filled 2018-05-20: qty 1

## 2018-05-20 MED ORDER — SODIUM CHLORIDE 0.9 % IV SOLN
INTRAVENOUS | Status: DC
  Administered 2018-05-20: 20:00:00 via INTRAVENOUS

## 2018-05-20 MED ORDER — STROKE: EARLY STAGES OF RECOVERY BOOK
Freq: Once | Status: AC
  Administered 2018-05-20: 20:00:00

## 2018-05-20 NOTE — ED Notes (Signed)
Pt taken to floor in stretcher. VSS. NAD. Report called to floor.  

## 2018-05-20 NOTE — ED Notes (Signed)
Destiny Mack [phone] Call her anytime no matter what you need. (Daughter)

## 2018-05-20 NOTE — H&P (Signed)
[facility] [place] at [facility]   PATIENT NAME: Destiny Mack    MR#:  [id number]  DATE OF BIRTH:  [birth year]  DATE OF ADMISSION:  05/20/2018  PRIMARY CARE PHYSICIAN: Oswaldo Conroy, MD   REQUESTING/REFERRING PHYSICIAN: LORD  CHIEF COMPLAINT:  No left upper extremity weakness  HISTORY OF PRESENT ILLNESS:  Destiny Mack  is a 72 y.o. female with a known history of stroke with right-sided weakness patient is bedbound from last year, hypertension, chronic atrial fibrillation on Eliquis, depression, other medical problems is living with her daughter and daughter has noticed new left upper extremity weakness through the weekend which was confirmed by the caretaker today and patient is brought into the emergency department CT head is revealed subacute stroke.  Patient is very lethargic and seems to be dehydrated with abnormal urinalysis unable to give any history to me.  History is obtained from patient's daughter Destiny Mack who takes care of her mom  PAST MEDICAL HISTORY:   Past Medical History:  Diagnosis Date  . A-fib (HCC)   . Depression   . Frequent falls   . Hypertension   . Leaking of urine   . Mixed incontinence   . Stroke (HCC)   . Tremor of hands and face     PAST SURGICAL HISTOIRY:   Past Surgical History:  Procedure Laterality Date  . APPENDECTOMY    . TEE WITHOUT CARDIOVERSION N/A 01/04/2018   Procedure: TRANSESOPHAGEAL ECHOCARDIOGRAM (TEE);  Surgeon: Lamar Blinks, MD;  Location: [facility] ORS;  Service: Cardiovascular;  Laterality: N/A;  . TUBAL LIGATION      SOCIAL HISTORY:   Social History   Tobacco Use  . Smoking status: Current Every Day Smoker    Packs/day: 0.50    Years: 56.00    Pack years: 28.00    Last attempt to quit: 11/22/2017    Years since quitting: 0.4  . Smokeless tobacco: Never Used  Substance Use Topics  . Alcohol use: No    Frequency: Never    FAMILY HISTORY:   Family History  Problem Relation Age  of Onset  . Dementia Mother   . Heart failure Mother   . Heart failure Father   . CAD Father     DRUG ALLERGIES:  No Known Allergies  REVIEW OF SYSTEMS:  Review of system unobtainable from delirium  MEDICATIONS AT HOME:   Prior to Admission medications   Medication Sig Start Date End Date Taking? Authorizing Provider  amLODipine (NORVASC) 5 MG tablet Take 5 mg by mouth daily.   Yes [provider]  apixaban (ELIQUIS) 5 MG TABS tablet Take 1 tablet (5 mg total) by mouth 2 (two) times daily. 12/15/17 05/20/18 Yes Pyreddy, Vivien Rota, MD  atorvastatin (LIPITOR) 20 MG tablet Take 20 mg by mouth at bedtime.    Yes [provider]  lisinopril (PRINIVIL,ZESTRIL) 20 MG tablet Take 30 mg by mouth daily.    Yes [provider]      VITAL SIGNS:  Blood pressure (!) 146/66, pulse (!) 58, temperature 98 F (36.7 C), temperature source Oral, resp. rate 20, height 5\' 2"  (1.575 m), weight 61.8 kg, SpO2 96 %.  PHYSICAL EXAMINATION:  GENERAL:  72 y.o.-year-old patient lying in the bed with no acute distress.  EYES: Pupils equal, round, reactive to light and accommodation. No scleral icterus. Extraocular muscles intact.  Dry mucous membranes HEENT: Head atraumatic, normocephalic. Oropharynx and nasopharynx clear.  NECK:  Supple, no jugular venous distention. No  thyroid enlargement, no tenderness.  LUNGS: Normal breath sounds bilaterally, no wheezing, rales,rhonchi or crepitation. No use of accessory muscles of respiration.  CARDIOVASCULAR: S1, S2 normal. No murmurs, rubs, or gallops.  ABDOMEN: Soft, nontender, nondistended. Bowel sounds present.  EXTREMITIES: No pedal edema, cyanosis, or clubbing.  NEUROLOGIC: Arousable but disoriented  pSYCHIATRIC: The patient is arousable but disoriented.  SKIN: No obvious rash, lesion, or ulcer.   LABORATORY PANEL:   CBC Recent Labs  Lab 05/20/18 1224  WBC 8.9  HGB 12.4  HCT 37.7  PLT 174    ------------------------------------------------------------------------------------------------------------------  Chemistries  Recent Labs  Lab 05/20/18 1224  NA 140  K 4.0  CL 115*  CO2 18*  GLUCOSE 115*  BUN 39*  CREATININE 1.74*  CALCIUM 9.2   ------------------------------------------------------------------------------------------------------------------  Cardiac Enzymes No results for input(s): TROPONINI in the last 168 hours. ------------------------------------------------------------------------------------------------------------------  RADIOLOGY:  Ct Head Wo Contrast  Result Date: 05/20/2018 CLINICAL DATA:  72 year old female with a history of weakness and history of prior clinical stroke EXAM: CT HEAD WITHOUT CONTRAST TECHNIQUE: Contiguous axial images were obtained from the base of the skull through the vertex without intravenous contrast. COMPARISON:  MR 12/13/2017, CT 12/13/2017, CT 10/29/2017 FINDINGS: Brain: No acute intracranial hemorrhage. No midline shift or mass effect. Since the prior CT there is new hypodense defect extending through the cortex in the left frontal region (image 10 of series 3). This finding was not present on prior CT or MRI. Redemonstration of confluent hypodensity in the periventricular white matter. Unchanged configuration of the ventricles. Hypodensity in the paramedian left occipital lobe, at the cuneus, which is likely representative of encephalomalacia given the prior diffusion abnormality at this location in April 2019. Vascular: Calcifications of the intracranial vasculature. Skull: No displaced fracture. Sinuses/Orbits: No significant paranasal sinus disease. Unremarkable orbits. Other: None IMPRESSION: New hypodensity of the left frontal region, most compatible with subacute/chronic infarction. Encephalomalacia in the left occipital lobe, correlating to findings on prior MRI and compatible with chronic infarction. Chronic microvascular  ischemic disease. Electronically Signed   By: Gilmer Mor D.O.   On: 05/20/2018 13:01    EKG:   Orders placed or performed during the hospital encounter of 12/31/17  . ED EKG 12-Lead  . ED EKG 12-Lead    IMPRESSION AND PLAN:     Acute metabolic encephalopathy from dehydration, possible UTI and subacute CVA Admit to MedSurg unit Gentle hydration with IV fluids CT head with subacute CVA Check urine culture and start antibiotics  Subacute CVA with new left upper extremity weakness CT head with subacute CVA N.p.o. and Endo permissive hypertension MRI of the brain Neurochecks neurology consult placed Carotid Dopplers were done in March 2019 less than 50% ICA stenosis May 2019 TEE with 55 to 60% ejection fraction no vegetations Bedside swallow evaluation Speech therapy PT OT consult  Abnormal urinalysis with possible UTI Urine culture and sensitivity and IV Rocephin  Essential hypertension allow permissive hypertension Hold antihypertensives which are home medications  Chronic atrial fibrillation-rate controlled Resume home medication Eliquis after swallow evaluation by speech therapy   All the records are reviewed and case discussed with ED provider. Management plans discussed with the patient, family and they are in agreement.  CODE STATUS: fc ,daughter HCPOA  TOTAL TIME TAKING CARE OF THIS PATIENT: 43  minutes.   Note: This dictation was prepared with Dragon dictation along with smaller phrase technology. Any transcriptional errors that result from this process are unintentional.  Ramonita Lab M.D on 05/20/2018 at 4:27  PM  Between 7am to 6pm - Pager - [phone]  After 6pm go to [email or web address] - password [email or web address] [facility]  [facility]  Office  [phone]  CC: Primary care physician; Oswaldo ConroyBender, [name], MD

## 2018-05-20 NOTE — Consult Note (Signed)
ANTICOAGULATION CONSULT NOTE - Initial Consult  Pharmacy Consult for Apixaban Dosing  Indication: atrial fibrillation   Patient Measurements: Height: 5\' 4"  (162.6 cm) Weight: 130 lb 9.6 oz (59.2 kg) IBW/kg (Calculated) : 54.7  Vital Signs: Temp: 98.9 F (37.2 C) (09/23 1829) Temp Source: Oral (09/23 1829) BP: 140/58 (09/23 1829) Pulse Rate: 55 (09/23 1829)  Labs: Recent Labs    05/20/18 1224  HGB 12.4  HCT 37.7  PLT 174  CREATININE 1.74*    Estimated Creatinine Clearance: 25.2 mL/min (A) (by C-G formula based on SCr of 1.74 mg/dL (H)).  Medical History: Past Medical History:  Diagnosis Date  . A-fib (HCC)   . Depression   . Frequent falls   . Hypertension   . Leaking of urine   . Mixed incontinence   . Stroke (HCC)   . Tremor of hands and face     Assessment: Pharmacy consulted for apixaban dosing in 72 yo female with PMH of A. Fib. Patient admitted with CVA.  Patient NPO currently until swallow evaluation complete.  PTA patient was taking Apixaban 5mg  twice daily.     Plan:  Per admitting physicians instructions, pharmacy will resume apixaban after patient passes bedside swallow evaluation. Patient NPO at this time.   Gardner CandleSheema M Kingston Guiles, PharmD, BCPS Clinical Pharmacist 05/20/2018 7:26 PM

## 2018-05-20 NOTE — Progress Notes (Signed)
Family Meeting Note  [facility] Directive:yes  Today a meeting took place with the Patient,daughter Destiny Mack  Patient is unable to participate due ZO:[id number]:Lacked capacity delerium   The following clinical team members were present during this meeting:MD  The following were discussed:Patient's diagnosis: Delirium subacute CVA, dehydration possible UTI, other comorbidities as documented below, treatment plan of care discussed in detail with the patient's daughter  A-fib [facility](HCC)    . Depression   . Frequent falls   . Hypertension   . Leaking of urine   . Mixed incontinence   . Stroke (HCC)   . Tremor of hands and face       patient's progosis: Unable to determine and Goals for treatment: Full Code, daughtet [name]  Additional follow-up to be provided: hospitalist,neurology   Time spent during discussion:9817min  [name], MD

## 2018-05-20 NOTE — ED Notes (Signed)
Pt brought back from CT and had an episode of emesis.

## 2018-05-20 NOTE — ED Provider Notes (Signed)
[facility] Emergency Department Provider Note ____________________________________________   I have reviewed the triage vital signs and the triage nursing note.  HISTORY  Chief Complaint Weakness   Historian Level 5 Caveat History Limited by poor historian  HPI Destiny Mack is a 72 y.o. female with a history of stroke leaving right sided weakness and patient bedbound since last year, living at home, presents after PT was working with patient this morning, concerned about left arm weakness and drawn up this morning.  Patient not complaining of pain, but seems to be poor historian.   Daughter came later and was able to provide additional history.  She is the caretaker.  She noticed on Friday that the patient was not using her left arm as typical and it was drawn up. She states over the weekend the patient was not eating and drinking as normal.   Past Medical History:  Diagnosis Date  . A-fib (HCC)   . Depression   . Frequent falls   . Hypertension   . Leaking of urine   . Mixed incontinence   . Stroke (HCC)   . Tremor of hands and face     Patient Active Problem List   Diagnosis Date Noted  . Malnutrition of moderate degree 01/01/2018  . Pneumonia 12/31/2017  . Hypotension 12/13/2017  . Diarrhea 11/22/2017  . Campylobacter diarrhea 11/22/2017  . Acute encephalopathy 10/29/2017  . Pressure injury of skin 10/29/2017    Past Surgical History:  Procedure Laterality Date  . APPENDECTOMY    . TEE WITHOUT CARDIOVERSION N/A 01/04/2018   Procedure: TRANSESOPHAGEAL ECHOCARDIOGRAM (TEE);  Surgeon: Lamar BlinksKowalski, Bruce J, MD;  Location: [facility] ORS;  Service: Cardiovascular;  Laterality: N/A;  . TUBAL LIGATION      Prior to Admission medications   Medication Sig Start Date End Date Taking? Authorizing Provider  amLODipine (NORVASC) 5 MG tablet Take 5 mg by mouth daily.   Yes [provider]  apixaban (ELIQUIS) 5 MG TABS tablet Take 1 tablet (5 mg  total) by mouth 2 (two) times daily. 12/15/17 05/20/18 Yes Pyreddy, Vivien RotaPavan, MD  atorvastatin (LIPITOR) 20 MG tablet Take 20 mg by mouth at bedtime.    Yes [provider]  lisinopril (PRINIVIL,ZESTRIL) 20 MG tablet Take 30 mg by mouth daily.    Yes [provider]    No Known Allergies  Family History  Problem Relation Age of Onset  . Dementia Mother   . Heart failure Mother   . Heart failure Father   . CAD Father     Social History Social History   Tobacco Use  . Smoking status: Current Every Day Smoker    Packs/day: 0.50    Years: 56.00    Pack years: 28.00    Last attempt to quit: 11/22/2017    Years since quitting: 0.4  . Smokeless tobacco: Never Used  Substance Use Topics  . Alcohol use: No    Frequency: Never  . Drug use: No    Review of Systems  Constitutional: Negative for fever. Eyes: Negative for red eyes. ENT: Negative for sore throat. Cardiovascular: Negative for chest pain. Respiratory: Negative for shortness of breath. Gastrointestinal: Negative for abdominal pain. Genitourinary: Negative for dysuria. Musculoskeletal: Negative for back pain. Skin: Negative for rash. Neurological: Negative for headache.  ____________________________________________   PHYSICAL EXAM:  VITAL SIGNS: ED Triage Vitals  Enc Vitals Group     BP      Pulse      Resp  Temp      Temp src      SpO2      Weight      Height      Head Circumference      Peak Flow      Pain Score      Pain Loc      Pain Edu?      Excl. in GC?      Constitutional: Alert and cooperative, pleasant, poor historian.  HEENT      Head: Normocephalic and atraumatic.      Eyes: Conjunctivae are normal. Pupils equal and round.       Ears:         Nose: No congestion/rhinnorhea.      Mouth/Throat: Mucous membranes are moist.      Neck: No stridor. Cardiovascular/Chest: Normal rate, regular rhythm.  No murmurs, rubs, or gallops. Respiratory: Normal respiratory effort  without tachypnea nor retractions. Breath sounds are clear and equal bilaterally. No wheezes/rales/rhonchi. Gastrointestinal: Soft. No distention, no guarding, no rebound. Nontender.    Genitourinary/rectal:Deferred Musculoskeletal: Nontender with normal range of motion in all extremities. No joint effusions.  No lower extremity tenderness.  No edema. Neurologic: No facial droop.  Normal speech and language. Right arm 3/5, left arm 4/5, right leg 3/5, left leg 4/5.  Denies sensory changes. Skin:  Skin is warm, dry and intact. No rash noted. Psychiatric: Cooperative.   ____________________________________________  LABS (pertinent positives/negatives) I, Governor Rooks, MD the attending physician have reviewed the labs noted below.  Labs Reviewed  URINALYSIS, COMPLETE (UACMP) WITH MICROSCOPIC - Abnormal; Notable for the following components:      Result Value   Color, Urine YELLOW (*)    APPearance CLOUDY (*)    Hgb urine dipstick MODERATE (*)    Protein, ur 100 (*)    Nitrite POSITIVE (*)    Leukocytes, UA MODERATE (*)    RBC / HPF >50 (*)    WBC, UA >50 (*)    Bacteria, UA MANY (*)    All other components within normal limits  BASIC METABOLIC PANEL - Abnormal; Notable for the following components:   Chloride 115 (*)    CO2 18 (*)    Glucose, Bld 115 (*)    BUN 39 (*)    Creatinine, Ser 1.74 (*)    GFR calc non Af Amer 28 (*)    GFR calc Af Amer 33 (*)    All other components within normal limits  CBC WITH DIFFERENTIAL/PLATELET - Abnormal; Notable for the following components:   RDW 14.8 (*)    Neutro Abs 6.8 (*)    All other components within normal limits    ____________________________________________    EKG I, Governor Rooks, MD, the attending physician have personally viewed and interpreted all ECGs.  None ____________________________________________  RADIOLOGY   CT head without contrast - radiologist interpretation: IMPRESSION: New hypodensity of the left  frontal region, most compatible with subacute/chronic infarction.  Encephalomalacia in the left occipital lobe, correlating to findings on prior MRI and compatible with chronic infarction.  Chronic microvascular ischemic disease. __________________________________________  PROCEDURES  Procedure(s) performed: None  Procedures  Critical Care performed: None   ____________________________________________  ED COURSE / ASSESSMENT AND PLAN  Pertinent labs & imaging results that were available during my care of the patient were reviewed by me and considered in my medical decision making (see chart for details).     Patient initially arrived with a complaint about weakness on the left  side, and she is certainly weak, but without knowing her baseline, uncertain what was going on.  Daughter did come to the bedside and was able to state that there was an acute change on Friday.  This is consistent with CT scan of the head which is showing subacute stroke.  Concerned because she really has not been eating and drinking well enough and is caused acute kidney injury/dehydration.  She is going to need speech eval and PT eval.  Apparently she has been on Eliquis, including on Friday when she sustained a stroke likely.    CONSULTATIONS: Hospitalist for admission   Patient / Family / Caregiver informed of clinical course, medical decision-making process, and agree with plan.    ___________________________________________   FINAL CLINICAL IMPRESSION(S) / ED DIAGNOSES   Final diagnoses:  Cerebrovascular accident (CVA), unspecified mechanism (HCC)  AKI (acute kidney injury) (HCC)      ___________________________________________         Note: This dictation was prepared with Office manager. Any transcriptional errors that result from this process are unintentional    Governor Rooks, MD 05/20/18 1537

## 2018-05-20 NOTE — ED Triage Notes (Signed)
Pt ro ED via [facility] with complaints of increased weakness. Pt had a stroke last year and has deficits on right side. Pt has Physical Therapy come to house and they reported to family that she has increased weakness and needs to be evaluated.

## 2018-05-20 NOTE — ED Notes (Signed)
Pts daughter is here and reports that on Friday after PT left her mother was unable to swallow her medication or food, this am she had to get thick secretions out of her mouth. Dr. Shaune PollackLord aware and in speaking with daughter at this time.

## 2018-05-21 ENCOUNTER — Inpatient Hospital Stay: Payer: Medicare Other

## 2018-05-21 DIAGNOSIS — N390 Urinary tract infection, site not specified: Secondary | ICD-10-CM | POA: Diagnosis not present

## 2018-05-21 DIAGNOSIS — Z23 Encounter for immunization: Secondary | ICD-10-CM | POA: Diagnosis not present

## 2018-05-21 DIAGNOSIS — R05 Cough: Secondary | ICD-10-CM | POA: Diagnosis present

## 2018-05-21 LAB — HEMOGLOBIN A1C
Hgb A1c MFr Bld: 5.5 % (ref 4.8–5.6)
MEAN PLASMA GLUCOSE: 111.15 mg/dL

## 2018-05-21 LAB — LIPID PANEL
CHOL/HDL RATIO: 4.9 ratio
Cholesterol: 162 mg/dL (ref 0–200)
HDL: 33 mg/dL — AB (ref >40)
LDL CALC: 91 mg/dL (ref 0–99)
Triglycerides: 191 mg/dL — ABNORMAL HIGH (ref <150)
VLDL: 38 mg/dL (ref 0–40)

## 2018-05-21 LAB — BASIC METABOLIC PANEL
Anion gap: 8 (ref 5–15)
BUN: 37 mg/dL — AB (ref 8–23)
CALCIUM: 8.9 mg/dL (ref 8.9–10.3)
CHLORIDE: 115 mmol/L — AB (ref 98–111)
CO2: 18 mmol/L — AB (ref 22–32)
CREATININE: 1.56 mg/dL — AB (ref 0.44–1.00)
GFR calc Af Amer: 37 mL/min — ABNORMAL LOW (ref >60)
GFR calc non Af Amer: 32 mL/min — ABNORMAL LOW (ref >60)
GLUCOSE: 82 mg/dL (ref 70–99)
Potassium: 4 mmol/L (ref 3.5–5.1)
Sodium: 141 mmol/L (ref 135–145)

## 2018-05-21 LAB — CREATININE, SERUM
CREATININE: 1.63 mg/dL — AB (ref 0.44–1.00)
GFR calc Af Amer: 35 mL/min — ABNORMAL LOW (ref >60)
GFR, EST NON AFRICAN AMERICAN: 30 mL/min — AB (ref >60)

## 2018-05-21 MED ORDER — APIXABAN 2.5 MG PO TABS
2.5000 mg | ORAL_TABLET | Freq: Two times a day (BID) | ORAL | Status: DC
  Administered 2018-05-21: 2.5 mg via ORAL
  Filled 2018-05-21: qty 1

## 2018-05-21 MED ORDER — CEPHALEXIN 250 MG PO CAPS: 250.0000 mg | ORAL_CAPSULE | Freq: Two times a day (BID) | ORAL | 0 refills | Status: AC

## 2018-05-21 MED ORDER — SODIUM CHLORIDE 0.9% FLUSH: 3.0000 mL | Freq: Two times a day (BID) | INTRAVENOUS | Status: DC

## 2018-05-21 MED ORDER — ATORVASTATIN CALCIUM 20 MG PO TABS: 20.0000 mg | ORAL_TABLET | Freq: Every day | ORAL | Status: DC

## 2018-05-21 MED ORDER — INFLUENZA VAC SPLIT QUAD 0.5 ML IM SUSY
0.5000 mL | PREFILLED_SYRINGE | Freq: Once | INTRAMUSCULAR | Status: AC
  Administered 2018-05-21: 0.5 mL via INTRAMUSCULAR
  Filled 2018-05-21: qty 0.5

## 2018-05-21 MED ORDER — APIXABAN 2.5 MG PO TABS: 2.5000 mg | ORAL_TABLET | Freq: Two times a day (BID) | ORAL | Status: DC

## 2018-05-21 NOTE — Discharge Summary (Addendum)
[employer] [facility] - [place] [facility]   PATIENT NAME: Destiny Mack    MR#:  [id number]  DATE OF BIRTH:  Apr 14, 1946  DATE OF ADMISSION:  05/20/2018   ADMITTING PHYSICIAN: Ramonita Lab, MD  DATE OF DISCHARGE: 05/21/2018  PRIMARY CARE PHYSICIAN: Oswaldo Conroy, MD   ADMISSION DIAGNOSIS:  Cough [R05] AKI (acute kidney injury) (HCC) [N17.9] Cerebrovascular accident (CVA), unspecified mechanism (HCC) [I63.9] DISCHARGE DIAGNOSIS:  Active Problems:   CVA (cerebral vascular accident) (HCC)  SECONDARY DIAGNOSIS:   Past Medical History:  Diagnosis Date  . A-fib (HCC)   . Depression   . Frequent falls   . Hypertension   . Leaking of urine   . Mixed incontinence   . Stroke (HCC)   . Tremor of hands and face    HOSPITAL COURSE:  Acute metabolic encephalopathy from dehydration, UTI The patient's mental status improved.  ARF on CKD stage 3.  Improving with IV fluids.  New left upper extremity weakness CT head: subacute CVA. But MRI of the brain: No acute/subacute CVA. Carotid Dopplers were done in March 2019 less than 50% ICA stenosis May 2019 TEE with 55 to 60% ejection fraction no vegetations Swallow evaluation: Dysphagia 3 diet.  UTI Patient is treated with IV Rocephin, changed to Keflex p.o. and follow-up urine culture with PCP.  Essential hypertension.  Continue Norvasc but hold lisinopril due to renal failure.  Chronic atrial fibrillation-rate controlled Resumed home medication Eliquis.  Tobacco abuse.  Smoking cessation was counseled for 3 to 4 minutes.  The patient wants to quit.  Generalized weakness.  The patient need home health. DISCHARGE CONDITIONS:  Stable, discharge to home with home health today. CONSULTS OBTAINED:   DRUG ALLERGIES:  No Known Allergies DISCHARGE MEDICATIONS:   Allergies as of 05/21/2018   No Known Allergies     Medication List    STOP taking these medications   lisinopril 20 MG tablet Commonly known as:   PRINIVIL,ZESTRIL     TAKE these medications   amLODipine 5 MG tablet Commonly known as:  NORVASC Take 5 mg by mouth daily.   apixaban 5 MG Tabs tablet Commonly known as:  ELIQUIS Take 1 tablet (5 mg total) by mouth 2 (two) times daily.   atorvastatin 20 MG tablet Commonly known as:  LIPITOR Take 20 mg by mouth at bedtime.   cephALEXin 250 MG capsule Commonly known as:  KEFLEX Take 1 capsule (250 mg total) by mouth 2 (two) times daily for 3 days.        DISCHARGE INSTRUCTIONS:  See AVS. If you experience worsening of your admission symptoms, develop shortness of breath, life threatening emergency, suicidal or homicidal thoughts you must seek medical attention immediately by calling 911 or calling your MD immediately  if symptoms less severe.  You Must read complete instructions/literature along with all the possible adverse reactions/side effects for all the Medicines you take and that have been prescribed to you. Take any new Medicines after you have completely understood and accpet all the possible adverse reactions/side effects.   Please note  You were cared for by a hospitalist during your hospital stay. If you have any questions about your discharge medications or the care you received while you were in the hospital after you are discharged, you can call the unit and asked to speak with the hospitalist on call if the hospitalist that took care of you is not available. Once you are discharged, your primary care physician will handle any  further medical issues. Please note that NO REFILLS for any discharge medications will be authorized once you are discharged, as it is imperative that you return to your primary care physician (or establish a relationship with a primary care physician if you do not have one) for your aftercare needs so that they can reassess your need for medications and monitor your lab values.    On the day of Discharge:  VITAL SIGNS:  Blood pressure (!)  126/59, pulse 60, temperature 98.3 F (36.8 C), temperature source Oral, resp. rate 18, height 5\' 4"  (1.626 m), weight 59.2 kg, SpO2 99 %. PHYSICAL EXAMINATION:  GENERAL:  72 y.o.-year-old patient lying in the bed with no acute distress.  EYES: Pupils equal, round, reactive to light and accommodation. No scleral icterus. Extraocular muscles intact.  HEENT: Head atraumatic, normocephalic. Oropharynx and nasopharynx clear.  NECK:  Supple, no jugular venous distention. No thyroid enlargement, no tenderness.  LUNGS: Normal breath sounds bilaterally, no wheezing, rales,rhonchi or crepitation. No use of accessory muscles of respiration.  CARDIOVASCULAR: S1, S2 normal. No murmurs, rubs, or gallops.  ABDOMEN: Soft, non-tender, non-distended. Bowel sounds present. No organomegaly or mass.  EXTREMITIES: No pedal edema, cyanosis, or clubbing.  NEUROLOGIC: Cranial nerves II through XII are intact. Muscle strength 4/5 in all extremities. Sensation intact. Gait not checked.  PSYCHIATRIC: The patient is alert and oriented x 3.  SKIN: No obvious rash, lesion, or ulcer.  DATA REVIEW:   CBC Recent Labs  Lab 05/20/18 1224  WBC 8.9  HGB 12.4  HCT 37.7  PLT 174    Chemistries  Recent Labs  Lab 05/21/18 0321  NA 141  K 4.0  CL 115*  CO2 18*  GLUCOSE 82  BUN 37*  CREATININE 1.56*  1.63*  CALCIUM 8.9     Microbiology Results  Results for orders placed or performed during the hospital encounter of 01/09/18  Urine Culture     Status: None   Collection Time: 01/09/18  4:08 PM  Result Value Ref Range Status   Specimen Description   Final    URINE, RANDOM Performed at [facility], [address] [place], [place] [address]    Special Requests   Final    NONE Performed at [facility], [address] [place], [place] [address]    Culture   Final    NO GROWTH Performed at [facility], [address] [place], [place] [address]    Report Status 01/11/2018 FINAL   Final    RADIOLOGY:  Mr Brain Wo Contrast  Result Date: 05/21/2018 CLINICAL DATA:  72 y/o  F; new left upper extremity weakness. EXAM: MRI HEAD WITHOUT CONTRAST MRA HEAD WITHOUT CONTRAST TECHNIQUE: Multiplanar, multiecho pulse sequences of the brain and surrounding structures were obtained without intravenous contrast. Angiographic images of the head were obtained using MRA technique without contrast. COMPARISON:  05/20/2018 CT head. 12/13/2017 MRI and MRA of the head. FINDINGS: MRI HEAD FINDINGS Brain: No acute infarction, hemorrhage, hydrocephalus, extra-axial collection or mass lesion. Small chronic infarcts in the left occipital lobe, right lateral temporal lobe, left anterior frontal lobe. The left anterior frontal lobe infarct is new from the prior MRI of the brain and correspond to lucency on CT. Stable advanced chronic microvascular ischemic changes of white matter and volume loss of the brain. Several foci of diffusion hyperintensity demonstrate increased diffusion on ADC compatible with T2 shine through. Vascular: Normal flow voids. Skull and upper cervical spine: Normal marrow signal. Sinuses/Orbits:  Mucosal thickening within the right sphenoid and left maxillary sinuses. Additional paranasal sinuses and the mastoid air cells demonstrate normal signal. Orbits are unremarkable. Other: None. MRA HEAD FINDINGS Internal carotid arteries:  Patent. Anterior cerebral arteries:  Patent. Middle cerebral arteries: Patent. Anterior communicating artery: Not identified, likely hypoplastic or absent. Posterior communicating arteries:  Patent.  Bilateral fetal PCA. Posterior cerebral arteries: Patent. Mild-to-moderate right stenosis. Basilar artery:  Patent. Vertebral arteries:  Patent. No evidence of high-grade stenosis, large vessel occlusion, or aneurysm unless noted above. IMPRESSION: MRI head: 1. No acute intracranial abnormality identified. 2. Small chronic infarcts are present in left occipital lobe,  right lateral temporal lobe, and left anterior frontal lobe. The left anterior frontal lobe chronic infarction is new from prior MRI. 3. Stable advanced chronic microvascular ischemic change and volume loss of the brain. MRA head: 1. Mild motion artifact. 2. Right P2 mild-to-moderate stenosis. 3. No high-grade stenosis, large vessel occlusion, or aneurysm identified. Electronically Signed   By: Mitzi Hansen M.D.   On: 05/21/2018 05:25   Dg Chest Port 1 View  Result Date: 05/20/2018 CLINICAL DATA:  Increased weakness. Stroke last year with RIGHT-sided deficits. EXAM: PORTABLE CHEST 1 VIEW COMPARISON:  Chest x-ray dated 12/31/2017. FINDINGS: Heart size and mediastinal contours are within normal limits. Lungs are clear. No pleural effusion or pneumothorax seen. Mild elevation of the LEFT hemidiaphragm is new, possibly related to patient positioning. Old healed rib fractures on the RIGHT. No acute or suspicious osseous finding. IMPRESSION: No active disease. No evidence of pneumonia or pulmonary edema. Slight elevation of the LEFT hemidiaphragm, of uncertain significance and possibly positional. Electronically Signed   By: Bary Richard M.D.   On: 05/20/2018 16:49   Mr Maxine Glenn Head/brain ZO Cm  Result Date: 05/21/2018 CLINICAL DATA:  72 y/o  F; new left upper extremity weakness. EXAM: MRI HEAD WITHOUT CONTRAST MRA HEAD WITHOUT CONTRAST TECHNIQUE: Multiplanar, multiecho pulse sequences of the brain and surrounding structures were obtained without intravenous contrast. Angiographic images of the head were obtained using MRA technique without contrast. COMPARISON:  05/20/2018 CT head. 12/13/2017 MRI and MRA of the head. FINDINGS: MRI HEAD FINDINGS Brain: No acute infarction, hemorrhage, hydrocephalus, extra-axial collection or mass lesion. Small chronic infarcts in the left occipital lobe, right lateral temporal lobe, left anterior frontal lobe. The left anterior frontal lobe infarct is new from the prior  MRI of the brain and correspond to lucency on CT. Stable advanced chronic microvascular ischemic changes of white matter and volume loss of the brain. Several foci of diffusion hyperintensity demonstrate increased diffusion on ADC compatible with T2 shine through. Vascular: Normal flow voids. Skull and upper cervical spine: Normal marrow signal. Sinuses/Orbits: Mucosal thickening within the right sphenoid and left maxillary sinuses. Additional paranasal sinuses and the mastoid air cells demonstrate normal signal. Orbits are unremarkable. Other: None. MRA HEAD FINDINGS Internal carotid arteries:  Patent. Anterior cerebral arteries:  Patent. Middle cerebral arteries: Patent. Anterior communicating artery: Not identified, likely hypoplastic or absent. Posterior communicating arteries:  Patent.  Bilateral fetal PCA. Posterior cerebral arteries: Patent. Mild-to-moderate right stenosis. Basilar artery:  Patent. Vertebral arteries:  Patent. No evidence of high-grade stenosis, large vessel occlusion, or aneurysm unless noted above. IMPRESSION: MRI head: 1. No acute intracranial abnormality identified. 2. Small chronic infarcts are present in left occipital lobe, right lateral temporal lobe, and left anterior frontal lobe. The left anterior frontal lobe chronic infarction is new from prior MRI. 3. Stable advanced chronic microvascular ischemic change and volume loss  of the brain. MRA head: 1. Mild motion artifact. 2. Right P2 mild-to-moderate stenosis. 3. No high-grade stenosis, large vessel occlusion, or aneurysm identified. Electronically Signed   By: Mitzi Hansen M.D.   On: 05/21/2018 05:25     Management plans discussed with the patient, family and they are in agreement.  CODE STATUS: Full Code   TOTAL TIME TAKING CARE OF THIS PATIENT: 35 minutes.    Shaune Pollack M.D on 05/21/2018 at 3:23 PM  Between 7am to 6pm - Pager - [phone]  After 6pm go to [email or web address] - Social research officer, government  [facility] [place] [facility]  Office  [phone]  CC: Primary care physician; Oswaldo Conroy, MD   Note: This dictation was prepared with Dragon dictation along with smaller phrase technology. Any transcriptional errors that result from this process are unintentional.

## 2018-05-21 NOTE — Evaluation (Addendum)
Clinical/Bedside Swallow Evaluation Patient Details  Name: Destiny Mack MRN: [id number] Date of Birth: 1946-07-17  Today's Date: 05/21/2018 Time: SLP Start Time (ACUTE ONLY): 0815 SLP Stop Time (ACUTE ONLY): 0915 SLP Time Calculation (min) (ACUTE ONLY): 60 min  Past Medical History:  Past Medical History:  Diagnosis Date  . A-fib (HCC)   . Depression   . Frequent falls   . Hypertension   . Leaking of urine   . Mixed incontinence   . Stroke (HCC)   . Tremor of hands and face    Past Surgical History:  Past Surgical History:  Procedure Laterality Date  . APPENDECTOMY    . TEE WITHOUT CARDIOVERSION N/A 01/04/2018   Procedure: TRANSESOPHAGEAL ECHOCARDIOGRAM (TEE);  Surgeon: Lamar Blinks, MD;  Location: [facility] ORS;  Service: Cardiovascular;  Laterality: N/A;  . TUBAL LIGATION     HPI:  Pt  is a 72 y.o. female with a known history of strokes with right-sided weakness patient is bedbound from last year, hypertension, chronic atrial fibrillation on Eliquis, depression, incontinence and other medical problems is living with her daughter.  Per previous MRI last admission in 10/2017, advanced atrophy and white matter disease consistent with chronic microvascular ischemia; remote lacunar infarcts involving the left paramedian pons and left cerebellum noted.  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  Recently, daughter has noticed new left upper extremity weakness through the weekend which was confirmed by the caretaker today and patient is brought into the emergency department MRI/CT head revealed Subacute stroke.  Patient is very lethargic and seems to be dehydrated with abnormal urinalysis.  Today, pt is much improved in her alertness per NSG post IV fluids, medication.  Pt is eager for breakfast; needed full assistance placing Dentures.    Assessment / Plan / Recommendation Clinical Impression  Pt appears to present w/ adequate oropharyngeal phase swallow function w/ no  overt s/s of aspiration noted during po trials at this evaluation. Pt had been NPO d/t presentation at admission of lethargy; weakness. Pt was much improved at eval w/ full alertness and engaged in conversation w/ SLP; min distracted at times. She need full assistance to place Dentures. Pt also presents w/ bilat. UE tremors and needed assistance w/ self-feeding. Pt is at reduced risk for aspiration following general aspiration precautions. Pt consumed po trials of thin liquids via Cup/Straw(straw worked best for pt d/t UE tremors and she was able to hold the cup), purees. and soft solids w/ no overt s/s of aspiration; no decline in vocal quality or respiratory status during/post trials. Pt's oral phase was [facility] w/ the trials given c/b adequate bolus management and timely A-P transfer and appropriate oral clearing post trials. Pt required min increased time for mastication of increased texture/solids. Pt assisted w/ tray setup and positioning; reducing distractions. Recommend a dysphagia level 3 w/ thin liquids(meats cut small); general aspiration precautions; Pills in Puree for safer, easier swallowing as needed. Pt appears at her baseline w/ regard to swallowing. NSG to reconsult ST services if any decline in swallowing status noted while admitted. Rec. f/u w/ Cognitive-linguistic assessment at discharge if decline in status is suspected.   SLP Visit Diagnosis: Dysphagia, unspecified (R13.10)(no significant deficits noted; wears Dentures baseline)    Aspiration Risk  (reduced following general aspiration precautions)    Diet Recommendation  Dysphagia level 3 (mech soft foods moistened) w/ Thin liquids; general aspiration precautions  Medication Administration: Whole meds with puree(as needed for easier swallowing)  Other  Recommendations Recommended Consults: (Dietician f/u) Oral Care Recommendations: Oral care BID;Staff/trained caregiver to provide oral care Other Recommendations: (n/a)   Follow up  Recommendations None      Frequency and Duration (n/a)  (n/a)       Prognosis Prognosis for Safe Diet Advancement: Good Barriers to Reach Goals: (baseline co-morbidities)      Swallow Study   General Date of Onset: 05/20/18 HPI: Pt  is a 72 y.o. female with a known history of strokes with right-sided weakness patient is bedbound from last year, hypertension, chronic atrial fibrillation on Eliquis, depression, incontinence and other medical problems is living with her daughter.  Per previous MRI last admission in 10/2017, advanced atrophy and white matter disease consistent with chronic microvascular ischemia; remote lacunar infarcts involving the left paramedian pons and left cerebellum noted.  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  Recently, daughter has noticed new left upper extremity weakness through the weekend which was confirmed by the caretaker today and patient is brought into the emergency department MRI/CT head revealed Subacute stroke.  Patient is very lethargic and seems to be dehydrated with abnormal urinalysis.  Today, pt is much improved in her alertness per NSG post IV fluids, medication.  Pt is eager for breakfast; needed full assistance placing Dentures.  Type of Study: Bedside Swallow Evaluation Previous Swallow Assessment: 10/2017 Diet Prior to this Study: Dysphagia 3 (soft);Thin liquids Temperature Spikes Noted: No(wbc 8.9) Respiratory Status: Room air History of Recent Intubation: No Behavior/Cognition: Alert;Cooperative;Pleasant mood;Distractible;Requires cueing Oral Cavity Assessment: Dry(Dentures placed) Oral Care Completed by SLP: Yes Oral Cavity - Dentition: Dentures, top;Dentures, bottom(placed) Vision: Functional for self-feeding Self-Feeding Abilities: Needs assist;Needs set up;Total assist(d/t weakness and tremors in UEs bilat.) Patient Positioning: Upright in bed(needed positioning) Baseline Vocal Quality: Normal Volitional Cough:  Strong Volitional Swallow: Able to elicit    Oral/Motor/Sensory Function Overall Oral Motor/Sensory Function: Within functional limits   Ice Chips Ice chips: Within functional limits Presentation: Spoon(fed; 2 trials)   Thin Liquid Thin Liquid: Within functional limits Presentation: Cup;Self Fed;Straw(~8 ozs total; best w/ straw d/t tremors)    Nectar Thick Nectar Thick Liquid: Not tested   Honey Thick Honey Thick Liquid: Not tested   Puree Puree: Within functional limits Presentation: Spoon(fed; 8 trials)   Solid     Solid: Impaired(mech soft trials moistened well) Presentation: Self Fed;Spoon(min assistance; 6 trials) Oral Phase Impairments: (mech soft was easier for mastication w/ dentures) Oral Phase Functional Implications: (needed min time for full mastication) Pharyngeal Phase Impairments: (none)       Jerilynn SomKatherine Avelyn Touch, MS, CCC-SLP Jamekia Gannett 05/21/2018,4:30 PM

## 2018-05-21 NOTE — Progress Notes (Signed)
OT Cancellation Note  Patient Details Name: Destiny BeringLinda M Mack MRN: [id number] DOB: 11/06/1945   Cancelled Treatment:    Reason Eval/Treat Not Completed: Other (comment). Per discussion with care manager (RN unavailable), patient prepared for discharge this date.  Bedbound at baseline; [facility].  No imminent OT needs prior to scheduled discharge.   Richrd PrimeJamie Stiller, MPH, MS, OTR/L ascom [phone] 05/21/18, 2:48 PM

## 2018-05-21 NOTE — Care Management Note (Signed)
Case Management Note  Patient Details  Name: Destiny BeringLinda M Code MRN: [id number] Date of Birth: 08/24/1946  Subjective/Objective:      Patient from home, lives with daughter.  Patient is alert and oriented.  Admitted with CVA.  Discharging today with home health RN and PT.  [facility] was patients choice as she has used them in the past.   Denies difficulties with obtaining medications.  No issues with transportation or housing.  Current with PCP.              Action/Plan:   Expected Discharge Date:  05/21/18               Expected Discharge Plan:  Home w Home Health Services  In-House Referral:     Discharge planning Services  CM Consult  Post Acute Care Choice:    Choice offered to:  Patient  DME Arranged:    DME Agency:     HH Arranged:  RN, PT HH Agency:  [facility]  Status of Service:  Completed, signed off  If discussed at Long Length of Stay Meetings, dates discussed:    Additional Comments:  Sherren KernsJennifer L Napoleon Monacelli, RN 05/21/2018, 1:36 PM

## 2018-05-21 NOTE — Progress Notes (Signed)
PT Cancellation Note  Patient Details Name: Destiny BeringLinda M Mack MRN: [id number] DOB: 30-Jan-1946   Cancelled Treatment:    Reason Eval/Treat Not Completed: (Per discussion with care manager (RN unavailable), patient prepared for discharge this date.  Bedbound at baseline; [facility].  No imminent PT needs prior to scheduled discharge.)   Buelah Rennie H. Manson PasseyBrown, PT, DPT, NCS 05/21/18, 2:41 PM [phone]

## 2018-05-21 NOTE — Discharge Instructions (Signed)
Fall and aspiration precaution. HHPT Smoking cessation.  Please have your kidney numbers checked at PCP follow up to make sure they have improved back to baseline.

## 2018-05-21 NOTE — Consult Note (Addendum)
ANTICOAGULATION CONSULT NOTE - Initial Consult  Pharmacy Consult for Apixaban Dosing  Indication: atrial fibrillation   Patient Measurements: Height: 5\' 4"  (162.6 cm) Weight: 130 lb 9.6 oz (59.2 kg) IBW/kg (Calculated) : 54.7  Vital Signs: Temp: 97.9 F (36.6 C) (09/24 0333) Temp Source: Oral (09/23 1829) BP: 123/52 (09/24 0333) Pulse Rate: 51 (09/24 0333)  Labs: Recent Labs    05/20/18 1224  HGB 12.4  HCT 37.7  PLT 174  CREATININE 1.74*    Estimated Creatinine Clearance: 25.2 mL/min (A) (by C-G formula based on SCr of 1.74 mg/dL (H)).  Medical History: Past Medical History:  Diagnosis Date  . A-fib (HCC)   . Depression   . Frequent falls   . Hypertension   . Leaking of urine   . Mixed incontinence   . Stroke (HCC)   . Tremor of hands and face     Assessment: Pharmacy consulted for apixaban dosing in 72 yo female with PMH of A. Fib. Patient admitted with CVA.  Patient NPO currently until swallow evaluation complete.  PTA patient was taking Apixaban 5mg  twice daily.     Plan:  Patient passed bedside swallow; however, per RN patient was having problems swallowing some water, but believes patient can take in thick liquids. Will restart anticoagulation considering patient suffered a TIA. Considering patient is in AKI (Scr 1.71 baseline 1.22) and wt. < 60 kg (ht 5'4"), Will start her on a dose of apixaban 2.5 mg twice daily, until Scr can recover to < 1.5 mg/dL. Advised RN that tablet can be crushed and given in applesauce. Baseline CBC WNL.  Thomasene Rippleavid  Laquan Beier, PharmD, BCPS Clinical Pharmacist 05/21/2018 4:07 AM

## 2018-05-21 NOTE — Progress Notes (Signed)
EMS called for transport home.

## 2018-05-23 LAB — URINE CULTURE: SPECIAL REQUESTS: NORMAL

## 2018-06-24 ENCOUNTER — Emergency Department: Payer: Medicare HMO

## 2018-06-24 ENCOUNTER — Other Ambulatory Visit: Payer: Self-pay

## 2018-06-24 ENCOUNTER — Inpatient Hospital Stay
Admission: EM | Admit: 2018-06-24 | Discharge: 2018-07-03 | DRG: 870 | Disposition: A | Discharge status: Home Health | Payer: Medicare HMO | Attending: Internal Medicine | Admitting: Internal Medicine

## 2018-06-24 ENCOUNTER — Inpatient Hospital Stay: Payer: Medicare HMO

## 2018-06-24 DIAGNOSIS — J181 Lobar pneumonia, unspecified organism: Secondary | ICD-10-CM | POA: Diagnosis present

## 2018-06-24 DIAGNOSIS — Z9049 Acquired absence of other specified parts of digestive tract: Secondary | ICD-10-CM

## 2018-06-24 DIAGNOSIS — J9602 Acute respiratory failure with hypercapnia: Secondary | ICD-10-CM | POA: Diagnosis present

## 2018-06-24 DIAGNOSIS — R4182 Altered mental status, unspecified: Secondary | ICD-10-CM | POA: Diagnosis present

## 2018-06-24 DIAGNOSIS — Z79899 Other long term (current) drug therapy: Secondary | ICD-10-CM

## 2018-06-24 DIAGNOSIS — I482 Chronic atrial fibrillation, unspecified: Secondary | ICD-10-CM | POA: Diagnosis present

## 2018-06-24 DIAGNOSIS — B9620 Unspecified Escherichia coli [E. coli] as the cause of diseases classified elsewhere: Secondary | ICD-10-CM | POA: Diagnosis present

## 2018-06-24 DIAGNOSIS — I161 Hypertensive emergency: Secondary | ICD-10-CM | POA: Diagnosis present

## 2018-06-24 DIAGNOSIS — R402432 Glasgow coma scale score 3-8, at arrival to emergency department: Secondary | ICD-10-CM | POA: Diagnosis present

## 2018-06-24 DIAGNOSIS — R0602 Shortness of breath: Secondary | ICD-10-CM

## 2018-06-24 DIAGNOSIS — N179 Acute kidney failure, unspecified: Secondary | ICD-10-CM | POA: Diagnosis present

## 2018-06-24 DIAGNOSIS — Z8673 Personal history of transient ischemic attack (TIA), and cerebral infarction without residual deficits: Secondary | ICD-10-CM | POA: Diagnosis not present

## 2018-06-24 DIAGNOSIS — N289 Disorder of kidney and ureter, unspecified: Secondary | ICD-10-CM

## 2018-06-24 DIAGNOSIS — I129 Hypertensive chronic kidney disease with stage 1 through stage 4 chronic kidney disease, or unspecified chronic kidney disease: Secondary | ICD-10-CM | POA: Diagnosis present

## 2018-06-24 DIAGNOSIS — G92 Toxic encephalopathy: Secondary | ICD-10-CM | POA: Diagnosis present

## 2018-06-24 DIAGNOSIS — N183 Chronic kidney disease, stage 3 (moderate): Secondary | ICD-10-CM | POA: Diagnosis present

## 2018-06-24 DIAGNOSIS — A419 Sepsis, unspecified organism: Secondary | ICD-10-CM | POA: Diagnosis present

## 2018-06-24 DIAGNOSIS — E869 Volume depletion, unspecified: Secondary | ICD-10-CM | POA: Diagnosis present

## 2018-06-24 DIAGNOSIS — J9601 Acute respiratory failure with hypoxia: Secondary | ICD-10-CM

## 2018-06-24 DIAGNOSIS — J9600 Acute respiratory failure, unspecified whether with hypoxia or hypercapnia: Secondary | ICD-10-CM

## 2018-06-24 DIAGNOSIS — D649 Anemia, unspecified: Secondary | ICD-10-CM | POA: Diagnosis present

## 2018-06-24 DIAGNOSIS — J90 Pleural effusion, not elsewhere classified: Secondary | ICD-10-CM

## 2018-06-24 DIAGNOSIS — I472 Ventricular tachycardia: Secondary | ICD-10-CM | POA: Diagnosis present

## 2018-06-24 DIAGNOSIS — Z7901 Long term (current) use of anticoagulants: Secondary | ICD-10-CM | POA: Diagnosis not present

## 2018-06-24 DIAGNOSIS — F17210 Nicotine dependence, cigarettes, uncomplicated: Secondary | ICD-10-CM | POA: Diagnosis present

## 2018-06-24 DIAGNOSIS — J189 Pneumonia, unspecified organism: Secondary | ICD-10-CM

## 2018-06-24 DIAGNOSIS — N390 Urinary tract infection, site not specified: Secondary | ICD-10-CM | POA: Diagnosis present

## 2018-06-24 DIAGNOSIS — Z4659 Encounter for fitting and adjustment of other gastrointestinal appliance and device: Secondary | ICD-10-CM

## 2018-06-24 DIAGNOSIS — I4891 Unspecified atrial fibrillation: Secondary | ICD-10-CM | POA: Diagnosis not present

## 2018-06-24 LAB — BLOOD GAS, ARTERIAL
Acid-base deficit: 5.8 mmol/L — ABNORMAL HIGH (ref 0.0–2.0)
Bicarbonate: 18.5 mmol/L — ABNORMAL LOW (ref 20.0–28.0)
FIO2: 0.4
LHR: 16 {breaths}/min
MECHVT: 450 mL
O2 Saturation: 99 %
PATIENT TEMPERATURE: 37
PEEP: 5 cmH2O
pCO2 arterial: 32 mmHg (ref 32.0–48.0)
pH, Arterial: 7.37 (ref 7.350–7.450)
pO2, Arterial: 136 mmHg — ABNORMAL HIGH (ref 83.0–108.0)

## 2018-06-24 LAB — URINALYSIS, COMPLETE (UACMP) WITH MICROSCOPIC
Bacteria, UA: NONE SEEN
RBC / HPF: >50 RBC/hpf — ABNORMAL HIGH (ref 0–5)
RBC / HPF: >50 RBC/hpf (ref 0–5)
Specific Gravity, Urine: 1.014 (ref 1.005–1.030)
Specific Gravity, Urine: 1.016 (ref 1.005–1.030)
Squamous Epithelial / LPF: NONE SEEN (ref 0–5)
Squamous Epithelial / LPF: NONE SEEN (ref 0–5)

## 2018-06-24 LAB — COMPREHENSIVE METABOLIC PANEL
ALBUMIN: 3.7 g/dL (ref 3.5–5.0)
ALBUMIN: 3.6 g/dL (ref 3.5–5.0)
ALK PHOS: 103 U/L (ref 38–126)
ALT: 12 U/L (ref 0–44)
ALT: 11 U/L (ref 0–44)
ANION GAP: 8 (ref 5–15)
ANION GAP: 10 (ref 5–15)
AST: 15 U/L (ref 15–41)
AST: 18 U/L (ref 15–41)
Alkaline Phosphatase: 97 U/L (ref 38–126)
BUN: 39 mg/dL — ABNORMAL HIGH (ref 8–23)
BUN: 36 mg/dL — ABNORMAL HIGH (ref 8–23)
CALCIUM: 9.1 mg/dL (ref 8.9–10.3)
CHLORIDE: 116 mmol/L — AB (ref 98–111)
CO2: 19 mmol/L — ABNORMAL LOW (ref 22–32)
CO2: 18 mmol/L — AB (ref 22–32)
CREATININE: 2.03 mg/dL — AB (ref 0.44–1.00)
Calcium: 8.9 mg/dL (ref 8.9–10.3)
Chloride: 116 mmol/L — ABNORMAL HIGH (ref 98–111)
Creatinine, Ser: 2.03 mg/dL — ABNORMAL HIGH (ref 0.44–1.00)
GFR calc non Af Amer: 23 mL/min — ABNORMAL LOW (ref >60)
GFR calc non Af Amer: 23 mL/min — ABNORMAL LOW (ref >60)
GFR, EST AFRICAN AMERICAN: 27 mL/min — AB (ref >60)
GFR, EST AFRICAN AMERICAN: 27 mL/min — AB (ref >60)
GLUCOSE: 140 mg/dL — AB (ref 70–99)
Glucose, Bld: 120 mg/dL — ABNORMAL HIGH (ref 70–99)
POTASSIUM: 5 mmol/L (ref 3.5–5.1)
Potassium: 5 mmol/L (ref 3.5–5.1)
SODIUM: 144 mmol/L (ref 135–145)
Sodium: 143 mmol/L (ref 135–145)
Total Bilirubin: 0.5 mg/dL (ref 0.3–1.2)
Total Bilirubin: 0.6 mg/dL (ref 0.3–1.2)
Total Protein: 7.3 g/dL (ref 6.5–8.1)
Total Protein: 7.1 g/dL (ref 6.5–8.1)

## 2018-06-24 LAB — CBC WITH DIFFERENTIAL/PLATELET
ABS IMMATURE GRANULOCYTES: 0.11 10*3/uL — AB (ref 0.00–0.07)
Basophils Absolute: 0.1 10*3/uL (ref 0.0–0.1)
Basophils Relative: 0 %
EOS ABS: 0 10*3/uL (ref 0.0–0.5)
Eosinophils Relative: 0 %
HEMATOCRIT: 38.4 % (ref 36.0–46.0)
Hemoglobin: 12 g/dL (ref 12.0–15.0)
IMMATURE GRANULOCYTES: 1 %
LYMPHS ABS: 0.7 10*3/uL (ref 0.7–4.0)
Lymphocytes Relative: 5 %
MCH: 30.2 pg (ref 26.0–34.0)
MCHC: 31.3 g/dL (ref 30.0–36.0)
MCV: 96.7 fL (ref 80.0–100.0)
MONOS PCT: 5 %
Monocytes Absolute: 0.8 10*3/uL (ref 0.1–1.0)
NEUTROS PCT: 89 %
Neutro Abs: 13 10*3/uL — ABNORMAL HIGH (ref 1.7–7.7)
Platelets: 236 10*3/uL (ref 150–400)
RBC: 3.97 MIL/uL (ref 3.87–5.11)
RDW: 14.8 % (ref 11.5–15.5)
WBC: 14.6 10*3/uL — ABNORMAL HIGH (ref 4.0–10.5)
nRBC: 0 % (ref 0.0–0.2)

## 2018-06-24 LAB — PROTIME-INR
INR: 1.43
INR: 1.44
PROTHROMBIN TIME: 17.4 s — AB (ref 11.4–15.2)
Prothrombin Time: 17.3 seconds — ABNORMAL HIGH (ref 11.4–15.2)

## 2018-06-24 LAB — BLOOD GAS, VENOUS
Acid-base deficit: 9.7 mmol/L — ABNORMAL HIGH (ref 0.0–2.0)
Bicarbonate: 16.6 mmol/L — ABNORMAL LOW (ref 20.0–28.0)
O2 Saturation: 94.9 %
PATIENT TEMPERATURE: 37
pCO2, Ven: 37 mmHg — ABNORMAL LOW (ref 44.0–60.0)
pH, Ven: 7.26 (ref 7.250–7.430)
pO2, Ven: 86 mmHg — ABNORMAL HIGH (ref 32.0–45.0)

## 2018-06-24 LAB — GLUCOSE, CAPILLARY: GLUCOSE-CAPILLARY: 184 mg/dL — AB (ref 70–99)

## 2018-06-24 LAB — CBC
HCT: 39.6 % (ref 36.0–46.0)
Hemoglobin: 12.2 g/dL (ref 12.0–15.0)
MCH: 30.6 pg (ref 26.0–34.0)
MCHC: 30.8 g/dL (ref 30.0–36.0)
MCV: 99.2 fL (ref 80.0–100.0)
NRBC: 0 % (ref 0.0–0.2)
PLATELETS: 225 10*3/uL (ref 150–400)
RBC: 3.99 MIL/uL (ref 3.87–5.11)
RDW: 15 % (ref 11.5–15.5)
WBC: 13.6 10*3/uL — AB (ref 4.0–10.5)

## 2018-06-24 LAB — APTT: aPTT: 43 seconds — ABNORMAL HIGH (ref 24–36)

## 2018-06-24 LAB — LACTIC ACID, PLASMA
LACTIC ACID, VENOUS: 1.4 mmol/L (ref 0.5–1.9)
Lactic Acid, Venous: 1.4 mmol/L (ref 0.5–1.9)

## 2018-06-24 LAB — CK: Total CK: 25 U/L — ABNORMAL LOW (ref 38–234)

## 2018-06-24 LAB — HEPARIN LEVEL (UNFRACTIONATED): Heparin Unfractionated: 2.32 IU/mL — ABNORMAL HIGH (ref 0.30–0.70)

## 2018-06-24 MED ORDER — SODIUM CHLORIDE 0.9 % IV SOLN
INTRAVENOUS | Status: DC
  Administered 2018-06-24: 17:00:00 via INTRAVENOUS

## 2018-06-24 MED ORDER — HEPARIN (PORCINE) IN NACL 100-0.45 UNIT/ML-% IJ SOLN
950.0000 [IU]/h | INTRAMUSCULAR | Status: DC
  Administered 2018-06-24: 950 [IU]/h via INTRAVENOUS
  Filled 2018-06-24: qty 250

## 2018-06-24 MED ORDER — ATORVASTATIN CALCIUM 20 MG PO TABS: 20.0000 mg | ORAL_TABLET | Freq: Every day | ORAL | Status: DC

## 2018-06-24 MED ORDER — ACETAMINOPHEN 325 MG PO TABS
650.0000 mg | ORAL_TABLET | Freq: Four times a day (QID) | ORAL | Status: DC | PRN
  Administered 2018-06-29: 650 mg via ORAL
  Filled 2018-06-24: qty 2

## 2018-06-24 MED ORDER — ONDANSETRON HCL 4 MG/2ML IJ SOLN: 4.0000 mg | Freq: Four times a day (QID) | INTRAMUSCULAR | Status: DC | PRN

## 2018-06-24 MED ORDER — VANCOMYCIN HCL IN DEXTROSE 1-5 GM/200ML-% IV SOLN
1000.0000 mg | Freq: Once | INTRAVENOUS | Status: AC
  Administered 2018-06-24: 1000 mg via INTRAVENOUS
  Filled 2018-06-24: qty 200

## 2018-06-24 MED ORDER — ENOXAPARIN SODIUM 40 MG/0.4ML ~~LOC~~ SOLN: 40.0000 mg | SUBCUTANEOUS | Status: DC

## 2018-06-24 MED ORDER — FAMOTIDINE IN NACL 20-0.9 MG/50ML-% IV SOLN: 20.0000 mg | INTRAVENOUS | Status: DC

## 2018-06-24 MED ORDER — HEPARIN BOLUS VIA INFUSION
4000.0000 [IU] | Freq: Once | INTRAVENOUS | Status: AC
  Administered 2018-06-24: 4000 [IU] via INTRAVENOUS
  Filled 2018-06-24: qty 4000

## 2018-06-24 MED ORDER — APIXABAN 5 MG PO TABS: 5.0000 mg | ORAL_TABLET | Freq: Two times a day (BID) | ORAL | Status: DC

## 2018-06-24 MED ORDER — PROPOFOL 1000 MG/100ML IV EMUL
5.0000 ug/kg/min | INTRAVENOUS | Status: DC
  Administered 2018-06-24: 5 ug/kg/min via INTRAVENOUS
  Administered 2018-06-25: 15 ug/kg/min via INTRAVENOUS
  Administered 2018-06-25: 25 ug/kg/min via INTRAVENOUS
  Administered 2018-06-25: 30 ug/kg/min via INTRAVENOUS
  Administered 2018-06-26: 65 ug/kg/min via INTRAVENOUS
  Administered 2018-06-26: 15 ug/kg/min via INTRAVENOUS
  Administered 2018-06-26: 35 ug/kg/min via INTRAVENOUS
  Administered 2018-06-27: 30 ug/kg/min via INTRAVENOUS
  Filled 2018-06-24 (×9): qty 100

## 2018-06-24 MED ORDER — PROPOFOL 1000 MG/100ML IV EMUL
5.0000 ug/kg/min | Freq: Once | INTRAVENOUS | Status: AC
  Administered 2018-06-24: 5 ug/kg/min via INTRAVENOUS
  Filled 2018-06-24: qty 100

## 2018-06-24 MED ORDER — ORAL CARE MOUTH RINSE
15.0000 mL | OROMUCOSAL | Status: DC
  Administered 2018-06-24 – 2018-06-29 (×46): 15 mL via OROMUCOSAL

## 2018-06-24 MED ORDER — SODIUM CHLORIDE 0.9 % IV SOLN
Freq: Once | INTRAVENOUS | Status: AC
  Administered 2018-06-24: 14:00:00 via INTRAVENOUS

## 2018-06-24 MED ORDER — AMLODIPINE BESYLATE 5 MG PO TABS
5.0000 mg | ORAL_TABLET | Freq: Every day | ORAL | Status: DC
  Administered 2018-06-24 – 2018-06-28 (×5): 5 mg
  Filled 2018-06-24 (×5): qty 1

## 2018-06-24 MED ORDER — PIPERACILLIN-TAZOBACTAM 3.375 G IVPB: 3.3750 g | Freq: Three times a day (TID) | INTRAVENOUS | Status: DC

## 2018-06-24 MED ORDER — SODIUM CHLORIDE 0.9 % IV SOLN
INTRAVENOUS | Status: DC | PRN
  Administered 2018-06-24 – 2018-06-27 (×2): 1000 mL via INTRAVENOUS

## 2018-06-24 MED ORDER — ONDANSETRON HCL 4 MG PO TABS: 4.0000 mg | ORAL_TABLET | Freq: Four times a day (QID) | ORAL | Status: DC | PRN

## 2018-06-24 MED ORDER — VANCOMYCIN VARIABLE DOSE PER UNSTABLE RENAL FUNCTION (PHARMACIST DOSING): Status: DC

## 2018-06-24 MED ORDER — SENNOSIDES-DOCUSATE SODIUM 8.6-50 MG PO TABS: 1.0000 | ORAL_TABLET | Freq: Every evening | ORAL | Status: DC | PRN

## 2018-06-24 MED ORDER — AMLODIPINE BESYLATE 5 MG PO TABS: 5.0000 mg | ORAL_TABLET | Freq: Every day | ORAL | Status: DC

## 2018-06-24 MED ORDER — PIPERACILLIN-TAZOBACTAM 3.375 G IVPB 30 MIN
3.3750 g | Freq: Once | INTRAVENOUS | Status: AC
  Administered 2018-06-24: 3.375 g via INTRAVENOUS
  Filled 2018-06-24: qty 50

## 2018-06-24 MED ORDER — SODIUM BICARBONATE 8.4 % IV SOLN
50.0000 meq | Freq: Once | INTRAVENOUS | Status: AC
  Administered 2018-06-25: 50 meq via INTRAVENOUS
  Filled 2018-06-24: qty 50

## 2018-06-24 MED ORDER — ACETAMINOPHEN 650 MG RE SUPP: 650.0000 mg | Freq: Four times a day (QID) | RECTAL | Status: DC | PRN

## 2018-06-24 MED ORDER — SODIUM CHLORIDE 0.9 % IV SOLN
1.0000 g | INTRAVENOUS | Status: DC
  Administered 2018-06-24 – 2018-06-26 (×3): 1 g via INTRAVENOUS
  Filled 2018-06-24 (×4): qty 1

## 2018-06-24 MED ORDER — SODIUM CHLORIDE 0.45 % IV SOLN
INTRAVENOUS | Status: DC
  Administered 2018-06-24 – 2018-06-27 (×7): via INTRAVENOUS

## 2018-06-24 MED ORDER — HYDROCODONE-ACETAMINOPHEN 5-325 MG PO TABS: 1.0000 | ORAL_TABLET | ORAL | Status: DC | PRN

## 2018-06-24 MED ORDER — VANCOMYCIN HCL IN DEXTROSE 1-5 GM/200ML-% IV SOLN
1000.0000 mg | INTRAVENOUS | Status: DC
  Administered 2018-06-25: 1000 mg via INTRAVENOUS
  Filled 2018-06-24: qty 200

## 2018-06-24 MED ORDER — CHLORHEXIDINE GLUCONATE 0.12% ORAL RINSE (MEDLINE KIT)
15.0000 mL | Freq: Two times a day (BID) | OROMUCOSAL | Status: DC
  Administered 2018-06-24 – 2018-06-29 (×10): 15 mL via OROMUCOSAL

## 2018-06-24 MED ORDER — ATORVASTATIN CALCIUM 20 MG PO TABS
20.0000 mg | ORAL_TABLET | Freq: Every day | ORAL | Status: DC
  Administered 2018-06-24 – 2018-07-02 (×6): 20 mg
  Filled 2018-06-24 (×7): qty 1

## 2018-06-24 MED ORDER — SUCCINYLCHOLINE CHLORIDE 20 MG/ML IJ SOLN
100.0000 mg | Freq: Once | INTRAMUSCULAR | Status: AC
  Administered 2018-06-24: 100 mg via INTRAVENOUS

## 2018-06-24 MED ORDER — ETOMIDATE 2 MG/ML IV SOLN
20.0000 mg | Freq: Once | INTRAVENOUS | Status: AC
  Administered 2018-06-24: 20 mg via INTRAVENOUS

## 2018-06-24 NOTE — ED Notes (Signed)
Pt to CT scan at this time.

## 2018-06-24 NOTE — ED Notes (Signed)
Back from CT scan

## 2018-06-24 NOTE — ED Notes (Signed)
20 mg Etomidate given in LFA by Aundra Millet, RN.

## 2018-06-24 NOTE — ED Triage Notes (Signed)
Pt brought in by [facility] coming from home due to altered mental status x 2 hours per family. Family reports she could have had a seizure, although she does not have hx of same. Pt is covered in feces and EMS reports pt lives in poor living conditions. Possible bed bug infestation at pt's home. Pt was hypoxic when EMS arrived. Sats were 80s on room air. She was placed on 15 lpm via NRB and sats are 100% on arrival to ER.

## 2018-06-24 NOTE — Progress Notes (Signed)
Pharmacy Antibiotic Note  Destiny Mack is a 72 y.o. female admitted on 06/24/2018 with sepsis.  Pharmacy has been consulted for Zosyn and vancomycin dosing.  Plan: 1. Zosyn 3.375 gm IV Q8H EI 2. Vancomycin 1 gm IV x 1 in ED followed in approximately 20 hours (stacked dosing) by vancomycin 1 gm IV Q36H, predicted trough 17 mcg/ml. Pharmacy will continue to follow and adjust as needed to maintain trough 15 to 20 mcg/ml.   Vd 41.7 L, Ke 0.024 hr-1, T1/2 28.9 hr  Height: 5\' 4"  (162.6 cm) Weight: 147 lb 14.4 oz (67.1 kg) IBW/kg (Calculated) : 54.7  Temp (24hrs), Avg:98.3 F (36.8 C), Min:98.3 F (36.8 C), Max:98.3 F (36.8 C)  Recent Labs  Lab 06/24/18 1327  WBC 14.6*  CREATININE 2.03*  LATICACIDVEN 1.4    Estimated Creatinine Clearance: 23.6 mL/min (A) (by C-G formula based on SCr of 2.03 mg/dL (H)).    No Known Allergies  Antimicrobials this admission: Vancomycin and Zosyn x 1 in ED  Dose adjustments this admission:   Microbiology results: 10/28 BCx: pending 10/28 UCx: pending   Sputum:    MRSA PCR:   Thank you for allowing pharmacy to be a part of this patient's care.  Carola Frost, Pharm.D., BCPS Clinical Pharmacist 06/24/2018 3:36 PM

## 2018-06-24 NOTE — Consult Note (Signed)
Name: Destiny Mack MRN: [id number] DOB: 11/22/1945     CONSULTATION DATE: 06/24/2018  HISTORY OF PRESENT ILLNESS:  72 years old lady with history of chronic atrial fibrillation on anticoagulation, chronic kidney disease stage III depression, hypertension, recent subacute stroke and frequent falls.  Patient presented to the ED after she was found unresponsive at her residence.  He was intubated in the emergency room for airway protection and CT head did not show any acute intracranial abnormalities.  Patient was recently discharged from the hospital after she was treated for subacute stroke.  She was found in a very poor hygienic state and living condition at her residence.  He was found to have UTI and met criteria for sepsis.  Patient received vancomycin and Zosyn and cefepime while in the ER. All history was obtained from admitting physician and EMR. Patient arrived to ice unit intubated on ventilator sedated with propofol and having normal saline maintenance fluid running at 75 mL/h.  PAST MEDICAL HISTORY :   has a past medical history of A-fib ([place]), Depression, Frequent falls, Hypertension, Leaking of urine, Mixed incontinence, Stroke ([place]), and Tremor of hands and face.  has a past surgical history that includes Tubal ligation; Appendectomy; and TEE without cardioversion (N/A, 01/04/2018). Prior to Admission medications   Medication Sig Start Date End Date Taking? Authorizing Provider  amLODipine (NORVASC) 5 MG tablet Take 5 mg by mouth daily.   Yes [provider]  apixaban (ELIQUIS) 5 MG TABS tablet Take 1 tablet (5 mg total) by mouth 2 (two) times daily. 12/15/17 06/24/18 Yes Pyreddy, Reatha Harps, MD  atorvastatin (LIPITOR) 20 MG tablet Take 20 mg by mouth at bedtime.    Yes [provider]   No Known Allergies  FAMILY HISTORY:  family history includes CAD in her father; Dementia in her mother; Heart failure in her father and mother. SOCIAL HISTORY:  reports that she  has been smoking. She has a 28.00 pack-year smoking history. She has never used smokeless tobacco. She reports that she does not drink alcohol or use drugs.  REVIEW OF SYSTEMS:   Unable to obtain due to critical illness   VITAL SIGNS: Temp:  [98.3 F (36.8 C)-100.2 F (37.9 C)] 100.2 F (37.9 C) (10/28 1635) Pulse Rate:  [65-90] 66 (10/28 1700) Resp:  [16-23] 16 (10/28 1700) BP: (141-217)/(51-91) 141/62 (10/28 1700) SpO2:  [96 %-100 %] 100 % (10/28 1700) FiO2 (%):  [40 %] 40 % (10/28 1633) Weight:  [61.9 kg-67.1 kg] 61.9 kg (10/28 1635)  Physical Examination:  Sedated with propofol to RASS o -3 On vent, no distress, bilateral equal air entry with no adventitious sounds S1 & S2 are audible with no murmur Benign abdominal exam with feeble peristalsis Wasted extremities and no edema  ASSESSMENT / PLAN: Acute respiratory failure intubated for airway protection with altered mental status. -Monitor ABG, optimize ventilator settings and continuous vent support  Altered mental status with toxic metabolic encephalopathy/hypertensive emergency.  No acute intracranial abnormalities on the CT head, bilateral frontal lobe infarct left larger than the right stable multifocal paranasal sinus disease -Monitor neuro status  Hypertensive emergency with altered mental status -Optimize antihypertensives and monitor hemodynamics  Atelectasis and pneumonia.  Left lower airspace disease -Cefepime + vancomycin to cover for H CAP considering recent hospital admission. -Monitor CXR + CBC + FiO2 requirement.  Follow with MRSA PCR and cultures  UTI -Cefepime + vancomycin.  Monitor renal culture  Sepsis -Empiric antimicrobials, monitor procalcitonin and consider de-escalation of antimicrobial's  final culture results.  chronic atrial fibrillation with controlled ventricular rate.  Was on Eliquis.  TEE 12/2017 LVEF 55 to 59% normal systolic function -Rate control and start on heparin for therapeutic  anticoagulation while intubated  AKI on CKD stage III with intravascular volume depletion -Optimize hydration, avoid nephrotoxins, monitor renal panel and urine output.  Full code  DVT & GI prophylaxis.  Continue with supportive care. Family was updated at the bedside and they agreed to the plan of management.  Critical care time 50 minutes

## 2018-06-24 NOTE — ED Notes (Addendum)
Pt intubated with 7.5 ET tube, 21 cm @ lip, +ETCo2 change. Breath sounds heard bilaterally with auscultation.

## 2018-06-24 NOTE — ED Notes (Signed)
16 French OG tube placed by MD. 65 cm at lip. Verified by auscultation.

## 2018-06-24 NOTE — ED Notes (Addendum)
[name], daughter, advised of pt's admission to ICU.

## 2018-06-24 NOTE — Progress Notes (Signed)
ANTICOAGULATION CONSULT NOTE - Initial Consult  Pharmacy Consult for heparin Indication: atrial fibrillation  No Known Allergies  Patient Measurements: Height: 5\' 4"  (162.6 cm) Weight: 136 lb 7.4 oz (61.9 kg) IBW/kg (Calculated) : 54.7 Heparin Dosing Weight: 61.9 kg  Vital Signs: Temp: 100.2 F (37.9 C) (10/28 1635) Temp Source: Oral (10/28 1635) BP: 149/55 (10/28 1800) Pulse Rate: 61 (10/28 1800)  Labs: Recent Labs    06/24/18 1327  HGB 12.0  HCT 38.4  PLT 236  LABPROT 17.3*  INR 1.43  CREATININE 2.03*    Estimated Creatinine Clearance: 21.6 mL/min (A) (by C-G formula based on SCr of 2.03 mg/dL (H)).   Medical History: Past Medical History:  Diagnosis Date  . A-fib (HCC)   . Depression   . Frequent falls   . Hypertension   . Leaking of urine   . Mixed incontinence   . Stroke (HCC)   . Tremor of hands and face     Medications:  Infusions:  . sodium chloride    . ceFEPime (MAXIPIME) IV    . famotidine (PEPCID) IV    . heparin    . propofol (DIPRIVAN) infusion    . [START ON 06/25/2018] vancomycin      Assessment: 71 yof cc unresponsive with PMH chronic AF, CKD 3, recent discharge for subacute stroke. Patient takes apixaban PTA but last dose is unknown. Pharmacy consulted to transition from apixaban to heparin. Will check baseline aPTT/INR/HL before starting.   Goal of Therapy:  Heparin level 0.3-0.7 units/ml Monitor platelets by anticoagulation protocol: Yes   Plan:  Give 4000 units bolus x 1 Start heparin infusion at 950 units/hr Check aPTT in 6 hours and daily while on heparin, monitor daily HL until aPTT and HL correlate Continue to monitor H&H and platelets  Carola Frost, Pharm.D., BCPS Clinical Pharmacist 06/24/2018,6:43 PM

## 2018-06-24 NOTE — ED Notes (Signed)
100 mg Succinylcholine given in LFA by Aundra Millet, RN.

## 2018-06-24 NOTE — ED Provider Notes (Signed)
[facility]  Department of Emergency Medicine    Chief Complaint: Cardiac arrest/unresponsive   Level V Caveat: Unresponsive  History of present illness: Patient was brought in by EMS unresponsive.  She has had altered mental status for the last 2 hours according to family.  Family reports she could have had a seizure but does not have a history of same.  She is covered in vomitus EMS reports extremely poor living conditions possible bedbug infestation.  She was hypoxic on room air with poor or no gag reflex and minimal responsiveness.  ROS: Unable to obtain, Level V caveat  Scheduled Meds: Continuous Infusions: . propofol (DIPRIVAN) infusion     PRN Meds:. Past Medical History:  Diagnosis Date  . A-fib (HCC)   . Depression   . Frequent falls   . Hypertension   . Leaking of urine   . Mixed incontinence   . Stroke (HCC)   . Tremor of hands and face    Past Surgical History:  Procedure Laterality Date  . APPENDECTOMY    . TEE WITHOUT CARDIOVERSION N/A 01/04/2018   Procedure: TRANSESOPHAGEAL ECHOCARDIOGRAM (TEE);  Surgeon: Lamar Blinks, MD;  Location: [facility] ORS;  Service: Cardiovascular;  Laterality: N/A;  . TUBAL LIGATION     Social History   Socioeconomic History  . Marital status: Widowed    Spouse name: Not on file  . Number of children: Not on file  . Years of education: Not on file  . Highest education level: Not on file  Occupational History  . Not on file  Social Needs  . Financial resource strain: Not on file  . Food insecurity:    Worry: Not on file    Inability: Not on file  . Transportation needs:    Medical: Not on file    Non-medical: Not on file  Tobacco Use  . Smoking status: Current Every Day Smoker    Packs/day: 0.50    Years: 56.00    Pack years: 28.00    Last attempt to quit: 11/22/2017    Years since quitting: 0.5  . Smokeless tobacco: Never Used  Substance and Sexual Activity  . Alcohol use: No    Frequency: Never  .  Drug use: No  . Sexual activity: Not Currently  Lifestyle  . Physical activity:    Days per week: Not on file    Minutes per session: Not on file  . Stress: Not on file  Relationships  . Social connections:    Talks on phone: Not on file    Gets together: Not on file    Attends religious service: Not on file    Active member of club or organization: Not on file    Attends meetings of clubs or organizations: Not on file    Relationship status: Not on file  . Intimate partner violence:    Fear of current or ex partner: Not on file    Emotionally abused: Not on file    Physically abused: Not on file    Forced sexual activity: Not on file  Other Topics Concern  . Not on file  Social History Narrative  . Not on file   No Known Allergies  Last set of Vital Signs (not current) Vitals:   06/24/18 1318 06/24/18 1322  BP:  (!) 147/55  Pulse:  85  Resp:  16  Temp:  98.3 F (36.8 C)  SpO2: 100% 100%      Physical Exam  Gen: unresponsive  Cardiovascular: Normal pulse Resp: apneic.  Wheezing and rhonchi bilaterally on a nonrebreather Abd: nondistended  Neuro: GCS 4, minimally responsive to pain HEENT: No blood in posterior pharynx, gag reflex absent  Neck: No crepitus  Musculoskeletal: No deformity  Skin: warm  Procedures  INTUBATION Performed by: Ulice Dash Required items: required blood products, implants, devices, and special equipment available Patient identity confirmed: provided demographic data and hospital-assigned identification number Time out: Immediately prior to procedure a "time out" was called to verify the correct patient, procedure, equipment, support staff and site/side marked as required. Indications: Acute respiratory failure, altered mental status Intubation method: Glide scope Preoxygenation: 100% BVM Sedatives: 20 mg of etomidate Paralytic: 100 mg succinylcholine Tube Size: 7.5 cuffed Post-procedure assessment: chest rise and ETCO2  monitor Breath sounds: equal and absent over the epigastrium Tube secured by Respiratory Therapy Patient tolerated the procedure well with no immediate complications.  CRITICAL CARE Performed by: Ulice Dash Total critical care time: 30 Critical care time was exclusive of separately billable procedures and treating other patients. Critical care was necessary to treat or prevent imminent or life-threatening deterioration. Critical care was time spent personally by me on the following activities: development of treatment plan with patient and/or surrogate as well as nursing, discussions with consultants, evaluation of patient's response to treatment, examination of patient, obtaining history from patient or surrogate, ordering and performing treatments and interventions, ordering and review of laboratory studies, ordering and review of radiographic studies, pulse oximetry and re-evaluation of patient's condition.  Labs Reviewed  COMPREHENSIVE METABOLIC PANEL - Abnormal; Notable for the following components:      Result Value   Chloride 116 (*)    CO2 19 (*)    Glucose, Bld 140 (*)    BUN 39 (*)    Creatinine, Ser 2.03 (*)    GFR calc non Af Amer 23 (*)    GFR calc Af Amer 27 (*)    All other components within normal limits  CBC WITH DIFFERENTIAL/PLATELET - Abnormal; Notable for the following components:   WBC 14.6 (*)    Neutro Abs 13.0 (*)    Abs Immature Granulocytes 0.11 (*)    All other components within normal limits  PROTIME-INR - Abnormal; Notable for the following components:   Prothrombin Time 17.3 (*)    All other components within normal limits  URINALYSIS, COMPLETE (UACMP) WITH MICROSCOPIC - Abnormal; Notable for the following components:   Color, Urine RED (*)    APPearance CLOUDY (*)    Glucose, UA   (*)    Value: TEST NOT REPORTED DUE TO COLOR INTERFERENCE OF URINE PIGMENT   Hgb urine dipstick   (*)    Value: TEST NOT REPORTED DUE TO COLOR INTERFERENCE OF  URINE PIGMENT   Bilirubin Urine   (*)    Value: TEST NOT REPORTED DUE TO COLOR INTERFERENCE OF URINE PIGMENT   Ketones, ur   (*)    Value: TEST NOT REPORTED DUE TO COLOR INTERFERENCE OF URINE PIGMENT   Protein, ur   (*)    Value: TEST NOT REPORTED DUE TO COLOR INTERFERENCE OF URINE PIGMENT   Nitrite   (*)    Value: TEST NOT REPORTED DUE TO COLOR INTERFERENCE OF URINE PIGMENT   Leukocytes, UA   (*)    Value: TEST NOT REPORTED DUE TO COLOR INTERFERENCE OF URINE PIGMENT   RBC / HPF >50 (*)    WBC, UA >50 (*)    All other components within normal  limits  BLOOD GAS, VENOUS - Abnormal; Notable for the following components:   pCO2, Ven 37 (*)    pO2, Ven 86.0 (*)    Bicarbonate 16.6 (*)    Acid-base deficit 9.7 (*)    All other components within normal limits  CULTURE, BLOOD (ROUTINE X 2)  CULTURE, BLOOD (ROUTINE X 2)  URINE CULTURE  LACTIC ACID, PLASMA  LACTIC ACID, PLASMA   EKG: Interpreted by me, sinus tachycardia with a rate of 112 bpm, likely anterior septal infarct that is old, normal axis, normal QT  Radiology:  CXR, CT head IMPRESSION: 1. Mild pulmonary hyperinflation. 2. Satisfactory support line and tube positions. 3. Minimal aortic atherosclerosis.  Medical Decision making  Acute respiratory failure, hypercarbia, hypoxemia, sepsis, aspiration, PE  Assessment and Plan  Altered mental status, acute respiratory failure, urinary tract infection, acute renal insufficiency  Patient was emergently intubated because she was unresponsive with no gag reflex and had vomitus on her mouth and sure. She has been unresponsive for 2 hours according to family.  It is unclear if she had a seizure initially prior to arrival.  On arrival she was found to have acute renal insufficiency, we started IV fluids.  She also has leukocytosis and purulent urine.  This is been sent for culture, blood cultures have been sent as well.  We ordered broad-spectrum antibiotics.  No obvious abnormality  viewed on CT head   Emily Filbert, MD 06/24/18 [phone]

## 2018-06-24 NOTE — Progress Notes (Signed)
Pharmacy Antibiotic Note  Destiny Mack is a 72 y.o. female admitted on 06/24/2018 with sepsis. Patient was hospitalized in September with subacute stoke. Patient has history significant for a.fib and CKD stage III. Patient transferred to ICU from ED requiring mechanical ventilation. Pharmacy has been consulted for vancomycin and cefepime dosing.  Plan: Continue cefepime 1g IV Q24hr with next dose scheduled at 2200 on 10/28.   Continue vancomycin 1g IV Q36hr. Will obtain CMP with am labs and redose according to renal function. Will obtain trough as clinically indicated.   Procalcitonin and MRSA PCR are pending.   Height: 5\' 4"  (162.6 cm) Weight: 147 lb 14.4 oz (67.1 kg) IBW/kg (Calculated) : 54.7  Temp (24hrs), Avg:98.3 F (36.8 C), Min:98.3 F (36.8 C), Max:98.3 F (36.8 C)  Recent Labs  Lab 06/24/18 1327  WBC 14.6*  CREATININE 2.03*  LATICACIDVEN 1.4    Estimated Creatinine Clearance: 23.6 mL/min (A) (by C-G formula based on SCr of 2.03 mg/dL (H)).    No Known Allergies  Antimicrobials this admission: Zosyn 10/28 x 1 Vancomycin 10/28 >>  Cefepime 10/28 >>   Dose adjustments this admission: N/A  Microbiology results: 10/28 BCx: sent  10/28 UCx: sent  10/28 MRSA PCR: pending   Thank you for allowing pharmacy to be a part of this patient's care.  David Towson L 06/24/2018 4:38 PM

## 2018-06-24 NOTE — H&P (Addendum)
[facility] [place] [facility]   PATIENT NAME: Destiny Mack    MR#:  [id number]  DATE OF BIRTH:  11-25-1945  DATE OF ADMISSION:  06/24/2018  PRIMARY CARE PHYSICIAN: Oswaldo Conroy, MD   REQUESTING/REFERRING PHYSICIAN: Dr. Mayford Knife  CHIEF COMPLAINT:   Unresponsive HISTORY OF PRESENT ILLNESS:  Destiny Mack  is a 72 y.o. female with a known history of chronic atrial fibrillation on anticoagulation, chronic kidney disease stage III and recent discharge from the hospital at the end of September with subacute stroke Who presented to the emergency room with unresponsiveness.  Patient currently intubated and sedated on ventilator. HPI taken from nurse and ER MD  Apparently family found the patient with altered mental status/unresponsiveness.  They called EMS and she was brought to the ER for further evaluation.  EMS reports that patient is not extremely poor living condition with possible bedbug infestation. She was covered in feces when she arrived in the emergency room. She was apparently wearing the same gown she was discharged with at the end of September.  She was found to be hypoxic and hypercarbic with no gag reflex and therefore was intubated by ER physician. She is currently on propofol for sedation. She has been started on Zosyn and vancomycin for presumed sepsis due to UTI. CT of the head shows no acute pathology and chest x-ray shows no acute infection. PAST MEDICAL HISTORY:   Past Medical History:  Diagnosis Date  . A-fib (HCC)   . Depression   . Frequent falls   . Hypertension   . Leaking of urine   . Mixed incontinence   . Stroke (HCC)   . Tremor of hands and face     PAST SURGICAL HISTORY:   Past Surgical History:  Procedure Laterality Date  . APPENDECTOMY    . TEE WITHOUT CARDIOVERSION N/A 01/04/2018   Procedure: TRANSESOPHAGEAL ECHOCARDIOGRAM (TEE);  Surgeon: Lamar Blinks, MD;  Location: [facility] ORS;  Service: Cardiovascular;   Laterality: N/A;  . TUBAL LIGATION      SOCIAL HISTORY:   Social History   Tobacco Use  . Smoking status: Current Every Day Smoker    Packs/day: 0.50    Years: 56.00    Pack years: 28.00    Last attempt to quit: 11/22/2017    Years since quitting: 0.5  . Smokeless tobacco: Never Used  Substance Use Topics  . Alcohol use: No    Frequency: Never    FAMILY HISTORY:   Family History  Problem Relation Age of Onset  . Dementia Mother   . Heart failure Mother   . Heart failure Father   . CAD Father     DRUG ALLERGIES:  No Known Allergies  REVIEW OF SYSTEMS:   Review of Systems  Unable to perform ROS: Intubated    MEDICATIONS AT HOME:   Prior to Admission medications   Medication Sig Start Date End Date Taking? Authorizing Provider  amLODipine (NORVASC) 5 MG tablet Take 5 mg by mouth daily.    [provider]  apixaban (ELIQUIS) 5 MG TABS tablet Take 1 tablet (5 mg total) by mouth 2 (two) times daily. 12/15/17 05/20/18  Ihor Austin, MD  atorvastatin (LIPITOR) 20 MG tablet Take 20 mg by mouth at bedtime.     [provider]      VITAL SIGNS:  Blood pressure (!) 152/65, pulse 71, temperature 98.3 F (36.8 C), temperature source Oral, resp. rate (!) 21, height 5\' 4"  (1.626 m), weight  67.1 kg, SpO2 100 %.  PHYSICAL EXAMINATION:   Physical Exam  Constitutional: No distress.  Disheveled  HENT:  Head: Normocephalic.  Eyes: No scleral icterus.  Pupils 3 mm bilaterally and sluggish  Neck: Normal range of motion. Neck supple. No JVD present. No tracheal deviation present.  Cardiovascular: Normal rate, regular rhythm and normal heart sounds. Exam reveals no gallop and no friction rub.  No murmur heard. Pulmonary/Chest: Effort normal and breath sounds normal. No respiratory distress. She has no wheezes. She has no rales. She exhibits no tenderness.  Abdominal: Soft. Bowel sounds are normal. She exhibits no distension and no mass. There is no  tenderness. There is no rebound and no guarding.  Musculoskeletal: Normal range of motion. She exhibits no edema.  Neurological:  Sedated on vent  Skin: Skin is warm. No rash noted. No erythema.  Psychiatric:  Sedated on vent      LABORATORY PANEL:   CBC Recent Labs  Lab 06/24/18 1327  WBC 14.6*  HGB 12.0  HCT 38.4  PLT 236   ------------------------------------------------------------------------------------------------------------------  Chemistries  Recent Labs  Lab 06/24/18 1327  NA 143  K 5.0  CL 116*  CO2 19*  GLUCOSE 140*  BUN 39*  CREATININE 2.03*  CALCIUM 9.1  AST 15  ALT 12  ALKPHOS 103  BILITOT 0.5   ------------------------------------------------------------------------------------------------------------------  Cardiac Enzymes No results for input(s): TROPONINI in the last 168 hours. ------------------------------------------------------------------------------------------------------------------  RADIOLOGY:  Ct Head Wo Contrast  Result Date: 06/24/2018 CLINICAL DATA:  Altered mental status with questionable seizure EXAM: CT HEAD WITHOUT CONTRAST TECHNIQUE: Contiguous axial images were obtained from the base of the skull through the vertex without intravenous contrast. COMPARISON:  Head CT May 20, 2018 and brain MRI May 21, 2018 FINDINGS: Brain: Moderate diffuse atrophy is stable. There is no intracranial mass, hemorrhage, extra-axial fluid collection, or midline shift. There is evidence of a prior infarct in the anterior left frontal lobe, midportion, stable. There is extensive small vessel disease throughout the centra semiovale bilaterally. There is evidence of a prior infarct at the right gray-white compartment junction of the posterior right frontal lobe. No acute infarct is demonstrable. Vascular: There is no appreciable hyperdense vessel. There is calcification in each carotid siphon region. Skull: The bony calvarium appears intact.  Sinuses/Orbits: There is a retention cyst in the inferolateral left maxillary antrum. There is slight mucosal thickening in the right posterior maxillary sinus. There is extensive opacification in multiple ethmoid air cells. There is opacification in the posterior sphenoid sinus regions. Orbits appear symmetric bilaterally. Other: Mastoid air cells are clear. IMPRESSION: 1. Stable atrophy with supratentorial small vessel disease. Prior frontal lobe region infarcts bilaterally, larger on the left than on the right, stable. No acute infarct. No mass or hemorrhage. 2.  There are foci of arterial vascular calcification. 3.  There is multifocal paranasal sinus disease. Electronically Signed   By: Bretta Bang III M.D.   On: 06/24/2018 15:10   Dg Chest Portable 1 View  Result Date: 06/24/2018 CLINICAL DATA:  To mental status x2 hours. EXAM: PORTABLE CHEST 1 VIEW COMPARISON:  None. FINDINGS: Endotracheal tube tip terminates 4 cm above the carina in satisfactory position. Gastric tube with side port projects below the left hemidiaphragm in the expected location of the stomach. The tip is excluded on this study however. The heart size and mediastinal contours are within normal limits. Mild pulmonary hyperinflation. Minimal aortic atherosclerosis similar to prior. No alveolar consolidation. Remote right-sided rib fractures involving the sixth  and seventh ribs. IMPRESSION: 1. Mild pulmonary hyperinflation. 2. Satisfactory support line and tube positions. 3. Minimal aortic atherosclerosis. Electronically Signed   By: Tollie Eth M.D.   On: 06/24/2018 14:07    EKG:   Sinus tachycardia with right atrial enlargement no ST elevation or depression  IMPRESSION AND PLAN:   72 year old female with history of chronic atrial fibrillation and recent discharge from the hospital at the end of September with subacute CVA who presented to the ER due to unresponsiveness.  1.  Acute hypoxic respiratory failure: Patient is  now intubated and sedated on ventilator. Patient needs to continue ventilator due to unresponsiveness and hypoxia. Case discussed with intensivist Consider MRI of the brain if patient's mental status does not improve over the next 24 hours.  2.  Sepsis: Patient presents with leukocytosis and tachypnea Sepsis due to urinary tract infection Continue IV fluids, Zosyn and vancomycin  Follow-up on all blood and urine cultures  3.  Acute on chronic kidney disease due to sepsis: Continue IV fluids and repeat BMP in a.m.  4.  Chronic atrial fibrillation: Continue telemetry and monitoring of heart rate and rhythm Patient currently in sinus tachycardia Patient on Eliquis at home 5.  Recent CVA: Continue Eliquis and statin if possible while intubated.   She will need case management for social issues prior to discharge. All the records are reviewed and case discussed with ED provider.   CODE STATUS: FULL  CRITICAL CARE TOTAL TIME TAKING CARE OF THIS PATIENT: 60 minutes.    Salisha Bardsley M.D on 06/24/2018 at 3:31 PM  Between 7am to 6pm - Pager - [phone]  After 6pm go to [email or web address] - Social research officer, government  [facility]  Office  [phone]  CC: Primary care physician; Oswaldo Conroy, MD

## 2018-06-24 NOTE — ED Notes (Signed)
MD is preparing for intubation due to pt being unresponsive.

## 2018-06-24 NOTE — ED Notes (Signed)
Dentures removed and placed in bag at bedside.

## 2018-06-25 LAB — CBC
HCT: 37.9 % (ref 36.0–46.0)
Hemoglobin: 11.4 g/dL — ABNORMAL LOW (ref 12.0–15.0)
MCH: 29.9 pg (ref 26.0–34.0)
MCHC: 30.1 g/dL (ref 30.0–36.0)
MCV: 99.5 fL (ref 80.0–100.0)
NRBC: 0 % (ref 0.0–0.2)
Platelets: 213 10*3/uL (ref 150–400)
RBC: 3.81 MIL/uL — ABNORMAL LOW (ref 3.87–5.11)
RDW: 15 % (ref 11.5–15.5)
WBC: 13.7 10*3/uL — ABNORMAL HIGH (ref 4.0–10.5)

## 2018-06-25 LAB — BASIC METABOLIC PANEL
ANION GAP: 11 (ref 5–15)
BUN: 35 mg/dL — ABNORMAL HIGH (ref 8–23)
CALCIUM: 8.6 mg/dL — AB (ref 8.9–10.3)
CO2: 21 mmol/L — ABNORMAL LOW (ref 22–32)
Chloride: 114 mmol/L — ABNORMAL HIGH (ref 98–111)
Creatinine, Ser: 2.09 mg/dL — ABNORMAL HIGH (ref 0.44–1.00)
GFR, EST AFRICAN AMERICAN: 26 mL/min — AB (ref >60)
GFR, EST NON AFRICAN AMERICAN: 23 mL/min — AB (ref >60)
GLUCOSE: 111 mg/dL — AB (ref 70–99)
Potassium: 4.4 mmol/L (ref 3.5–5.1)
Sodium: 146 mmol/L — ABNORMAL HIGH (ref 135–145)

## 2018-06-25 LAB — APTT
aPTT: 131 seconds — ABNORMAL HIGH (ref 24–36)
aPTT: 110 seconds — ABNORMAL HIGH (ref 24–36)

## 2018-06-25 LAB — MRSA PCR SCREENING: MRSA by PCR: NEGATIVE

## 2018-06-25 LAB — CK: Total CK: 43 U/L (ref 38–234)

## 2018-06-25 LAB — PROCALCITONIN
Procalcitonin: <0.1 ng/mL
Procalcitonin: <0.1 ng/mL

## 2018-06-25 MED ORDER — VITAL AF 1.2 CAL PO LIQD
1000.0000 mL | ORAL | Status: DC
  Administered 2018-06-25 – 2018-06-28 (×3): 1000 mL

## 2018-06-25 MED ORDER — HEPARIN (PORCINE) IN NACL 100-0.45 UNIT/ML-% IJ SOLN
850.0000 [IU]/h | INTRAMUSCULAR | Status: DC
  Administered 2018-06-25: 750 [IU]/h via INTRAVENOUS
  Administered 2018-06-26 – 2018-06-28 (×2): 550 [IU]/h via INTRAVENOUS
  Administered 2018-06-29: 700 [IU]/h via INTRAVENOUS
  Administered 2018-07-01: 850 [IU]/h via INTRAVENOUS
  Filled 2018-06-25 (×4): qty 250

## 2018-06-25 MED ORDER — FAMOTIDINE 20 MG PO TABS
20.0000 mg | ORAL_TABLET | Freq: Every day | ORAL | Status: DC
  Administered 2018-06-25 – 2018-07-02 (×5): 20 mg
  Filled 2018-06-25 (×6): qty 1

## 2018-06-25 MED ORDER — MIDAZOLAM HCL 2 MG/2ML IJ SOLN
INTRAMUSCULAR | Status: AC
  Filled 2018-06-25: qty 2

## 2018-06-25 MED ORDER — FREE WATER
30.0000 mL | Status: DC
  Administered 2018-06-25 – 2018-06-28 (×14): 30 mL

## 2018-06-25 MED ORDER — ADULT MULTIVITAMIN LIQUID CH
15.0000 mL | Freq: Every day | ORAL | Status: DC
  Administered 2018-06-25 – 2018-06-29 (×4): 15 mL
  Filled 2018-06-25 (×5): qty 15

## 2018-06-25 MED ORDER — IPRATROPIUM-ALBUTEROL 0.5-2.5 (3) MG/3ML IN SOLN
3.0000 mL | Freq: Four times a day (QID) | RESPIRATORY_TRACT | Status: DC
  Administered 2018-06-25 – 2018-07-01 (×25): 3 mL via RESPIRATORY_TRACT
  Filled 2018-06-25 (×24): qty 3

## 2018-06-25 MED ORDER — VITAL HIGH PROTEIN PO LIQD: 1000.0000 mL | ORAL | Status: DC

## 2018-06-25 MED ORDER — MIDAZOLAM HCL 2 MG/2ML IJ SOLN: 2.0000 mg | Freq: Once | INTRAMUSCULAR | Status: DC

## 2018-06-25 MED ORDER — PRO-STAT SUGAR FREE PO LIQD
30.0000 mL | Freq: Every day | ORAL | Status: DC
  Administered 2018-06-25 – 2018-06-29 (×5): 30 mL

## 2018-06-25 MED ORDER — IPRATROPIUM-ALBUTEROL 0.5-2.5 (3) MG/3ML IN SOLN
RESPIRATORY_TRACT | Status: AC
  Administered 2018-06-25: 09:00:00
  Filled 2018-06-25: qty 3

## 2018-06-25 NOTE — Progress Notes (Signed)
ANTICOAGULATION CONSULT NOTE  Pharmacy Consult for heparin Indication: atrial fibrillation  No Known Allergies  Patient Measurements: Height: 5\' 4"  (162.6 cm) Weight: 136 lb 7.4 oz (61.9 kg) IBW/kg (Calculated) : 54.7 Heparin Dosing Weight: 61.9 kg  Vital Signs: Temp: 99.9 F (37.7 C) (10/29 2000) Temp Source: Axillary (10/29 2000) BP: 147/59 (10/29 2100) Pulse Rate: 104 (10/29 2100)  Labs: Recent Labs    06/24/18 1327  06/24/18 1942 06/24/18 2250 06/25/18 0334 06/25/18 0410 06/25/18 1310 06/25/18 2125  HGB 12.0  --   --  12.2 11.4*  --   --   --   HCT 38.4  --   --  39.6 37.9  --   --   --   PLT 236  --   --  225 213  --   --   --   APTT  --    < > 43*  --   --  >160* 131* 110*  LABPROT 17.3*  --  17.4*  --   --   --   --   --   INR 1.43  --  1.44  --   --   --   --   --   HEPARINUNFRC  --   --  2.32*  --   --   --   --   --   CREATININE 2.03*  --   --  2.03* 2.09*  --   --   --   CKTOTAL  --   --  25*  --   --  43  --   --    < > = values in this interval not displayed.    Estimated Creatinine Clearance: 21 mL/min (A) (by C-G formula based on SCr of 2.09 mg/dL (H)).   Medical History: Past Medical History:  Diagnosis Date  . A-fib (HCC)   . Depression   . Frequent falls   . Hypertension   . Leaking of urine   . Mixed incontinence   . Stroke (HCC)   . Tremor of hands and face     Medications:  Infusions:  . sodium chloride 125 mL/hr at 06/25/18 1933  . sodium chloride Stopped (06/24/18 2155)  . ceFEPime (MAXIPIME) IV Stopped (06/24/18 2234)  . heparin 650 Units/hr (06/25/18 2100)  . propofol (DIPRIVAN) infusion 30 mcg/kg/min (06/25/18 2100)    Assessment: Pharmacy consulted for heparin drip management for 72 yo female admitted to ICU on 10/28 for sepsis. Patient requiring mechanical ventilation and was transitioned to heparin drip from apixaban. Patient with past medical history significant for chronic Afib, CKD 3, and recent discharge for subacute  stroke. CHA2DS2-VASc Score of at least 6 (Age > 75, Gender, HTN, Stroke).   Patient with Heparin is currently infusing at 750 units/hr.   Goal of Therapy:  Heparin level 0.3-0.7 units/ml  APTT: 68-109  Monitor platelets by anticoagulation protocol: Yes   Will decrease rate by 100 units/hr to 650 units/hr. Will obtain next aPTT at 2130.   Will obtain anti-Xa level with am labs. Will obtain anti-Xa levels daily until aPTT and anti-Xa levels correlate.    Plan:  10/29:  APTT @ 21:30 = 110. Will decrease heparin gtt rate to 550 units/hr and recheck aPTT 6 hrs after rate change.   Pharmacy will continue to monitor and adjust per consult.   Marisela Line D 06/25/2018,10:11 PM

## 2018-06-25 NOTE — Progress Notes (Signed)
[facility] [place] [facility]   PATIENT NAME: Destiny Mack    MR#:  [id number]  DATE OF BIRTH:  Sep 02, 1945  SUBJECTIVE:  CHIEF COMPLAINT:   Chief Complaint  Patient presents with  . Altered Mental Status  remains on vent - sedated REVIEW OF SYSTEMS:  Review of Systems  Unable to perform ROS: Intubated    DRUG ALLERGIES:  No Known Allergies VITALS:  Blood pressure (!) 161/58, pulse (!) 103, temperature 99.9 F (37.7 C), temperature source Oral, resp. rate 18, height 5\' 4"  (1.626 m), weight 61.9 kg, SpO2 100 %. PHYSICAL EXAMINATION:  Physical Exam  HENT:  Head: Normocephalic and atraumatic.  Eyes: Pupils are equal, round, and reactive to light. Conjunctivae and EOM are normal.  Neck: Normal range of motion. Neck supple. No tracheal deviation present. No thyromegaly present.  Cardiovascular: Normal rate, regular rhythm and normal heart sounds.  Pulmonary/Chest: Effort normal and breath sounds normal. No respiratory distress. She has no wheezes. She exhibits no tenderness.  Abdominal: Soft. Bowel sounds are normal. She exhibits no distension. There is no tenderness.  Musculoskeletal: Normal range of motion.  Neurological: No cranial nerve deficit.  Sedated on vent  Skin: Skin is warm and dry. No rash noted.  Psychiatric:  Sedated on vent   LABORATORY PANEL:  Female CBC Recent Labs  Lab 06/25/18 0334  WBC 13.7*  HGB 11.4*  HCT 37.9  PLT 213   ------------------------------------------------------------------------------------------------------------------ Chemistries  Recent Labs  Lab 06/24/18 2250 06/25/18 0334  NA 144 146*  K 5.0 4.4  CL 116* 114*  CO2 18* 21*  GLUCOSE 120* 111*  BUN 36* 35*  CREATININE 2.03* 2.09*  CALCIUM 8.9 8.6*  AST 18  --   ALT 11  --   ALKPHOS 97  --   BILITOT 0.6  --    RADIOLOGY:  Dg Abd 1 View  Result Date: 06/24/2018 CLINICAL DATA:  72 year old female. Orogastric tube placement. Initial encounter.  EXAM: ABDOMEN - 1 VIEW COMPARISON:  06/24/2018 chest x-ray. 10/29/2017 abdominal plain film exam. FINDINGS: Nasogastric tube tip gastric antrum level with side hole gastric body level. Radiopaque structure left upper quadrant unchanged from prior exam of questionable etiology. No free air detected on this erect view. IMPRESSION: Nasogastric tube tip gastric antrum level with side hole gastric body level. Electronically Signed   By: Lacy Duverney M.D.   On: 06/24/2018 17:41   ASSESSMENT AND PLAN:  72 year old female with history of chronic atrial fibrillation and recent discharge from the hospital at the end of September with subacute CVA who presented to the ER due to unresponsiveness.  1.  Acute hypoxic respiratory failure: remains intubated and sedated on ventilator. - vent mgmt per intensivist - Consider MRI of the brain if patient's mental status does not improve over the next 24 hours.  2.  Sepsis: present on admission due to urinary tract infection Continue IV fluids, Cefepime and vancomycin Follow-up on all blood and urine cultures  3.  Acute on chronic kidney disease due to sepsis: Continue IV fluids and repeat BMP in a.m.  4.  Chronic atrial fibrillation: Continue telemetry and monitoring of heart rate and rhythm Patient currently in sinus tachycardia Patient on Eliquis at home  5.  Recent CVA: Continue Eliquis and statin if possible while intubated.  6. 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 Hypertensive emergency  with altered mental status -Optimize antihypertensives and monitor hemodynamics  8. LLL pneumonia - Cefepime + vancomycin to cover for HCAP  9. AKI on CKD stage III with intravascular volume depletion -avoid nephrotoxins, monitor renal panel and urine output.     All the records are  reviewed and case discussed with Care Management/Social Worker. Management plans discussed with the patient, nursing and they are in agreement.  CODE STATUS: Full Code  TOTAL TIME TAKING CARE OF THIS PATIENT: 25 minutes.   More than 50% of the time was spent in counseling/coordination of care: YES  POSSIBLE D/C IN 3-4 DAYS, DEPENDING ON CLINICAL CONDITION.   Delfino Lovett M.D on 06/25/2018 at 4:19 PM  Between 7am to 6pm - Pager - [phone]  After 6pm go to [email or web address] - Social research officer, government  [facility]  Office  [phone]  CC: Primary care physician; Oswaldo Conroy, MD  Note: This dictation was prepared with Dragon dictation along with smaller phrase technology. Any transcriptional errors that result from this process are unintentional.

## 2018-06-25 NOTE — Care Management Note (Signed)
Case Management Note  Patient Details  Name: Destiny Mack MRN: [id number] Date of Birth: 06-07-1946  Subjective/Objective:       RNCM following up on home health status and home living conditions.  [name] with [employer] reports that she spoke with the physical therapist that has been going out to the home- he and the other team members reports that there are bedbugs in the home and that the living conditions are unsanitary.  [facility] reports that daughter wants to take care of the patient at home, son gets paid for providing personal care services.  RNCM made a report to [facility] APS.   Previous APS report made in May of this past year.               Action/Plan:  Continue to follow  Expected Discharge Date:                  Expected Discharge Plan:     In-House Referral:     Discharge planning Services  CM Consult  Post Acute Care Choice:    Choice offered to:     DME Arranged:    DME Agency:     HH Arranged:    HH Agency:     Status of Service:  In process, will continue to follow  If discussed at Long Length of Stay Meetings, dates discussed:    Additional Comments:  Allayne Butcher, RN 06/25/2018, 1:48 PM

## 2018-06-25 NOTE — Progress Notes (Signed)
Discussed with Annabelle Harman NP at beginning of shift regarding urine color and characteristics, along with color and characteristics of the gastric fluid coming from OG tube (see flowsheet for details). Verified initiation of heparin drip after discussing urine and gastric fluid, aknowledgement was given by Annabelle Harman NP to start the heparin drip and watch for any new signs and symptoms of bleeding.   Heparin drip has been infusing (see EMAR) no changes in urine or gastric fluid color/characteristics. No new signs of bleeding assessed so far. Will closely follow.

## 2018-06-25 NOTE — Clinical Social Work Note (Signed)
[facility]PS caseworker: Frances Furbish, called CSW to inform that they are following patient and to keep her updated. CSW to complete assessment. York Spaniel MSW,LCSW [phone]

## 2018-06-25 NOTE — Progress Notes (Signed)
Initial Nutrition Assessment  DOCUMENTATION CODES:   Non-severe (moderate) malnutrition in context of chronic illness  INTERVENTION:  Initiate Vital AF 1.2 at 40 mL/hr (960 mL goal daily volume) + Pro-Stat 30 mL once daily per OGT. Provides 1252 kcal, 87 grams of protein, 778 mL H2O daily. With current propofol rate provides 1519 kcal daily.  Provide liquid MVI daily per tube as goal TF regimen does not meet 100% RDIs for vitamins/minerals.  Provide free water flush of 30 mL Q4hrs to maintain tube patency.  Monitor magnesium, potassium, and phosphorus daily for at least 3 days, MD to replete as needed, as pt is at risk for refeeding syndrome given moderate malnutrition.  NUTRITION DIAGNOSIS:   Moderate Malnutrition related to chronic illness(hx CVA, inadequate oral intake) as evidenced by moderate fat depletion, moderate muscle depletion.  GOAL:   Provide needs based on ASPEN/SCCM guidelines  MONITOR:   Vent status, Labs, Weight trends, TF tolerance, Skin, I & O's  REASON FOR ASSESSMENT:   Ventilator    ASSESSMENT:   72 year old female with PMHx of depression, frequent falls, hx CVA, CKD, HTN, A-fib who is admitted with AMS requiring intubation on 10/28 for airway protection, toxic metabolic encephalopathy, HTN emergency, atelectasis, PNA, UTI, sepsis, AKI on CKD III.   Patient intubated and sedated. On PRVC mode with FiO2 30% and PEEP 5 cmH2O. Abdomen soft. Urine output in collection bag was very dark. No family members present at time of RD assessment. Patient is actually known to this RD from an admission in 12/2017. At that time she had a decreased appetite for 6 months and was only eating one meal per day prepared by her daughter. Patient had reported that admission that her UBW was 180 lbs. She was 133.8 lbs on 01/01/2018. Patient is currently 61.9 kg (136.47 lbs).  Enteral Access: OGT placed 10/28; terminates in gastric antrum per abdominal x-ray 10/28; 65 cm at corner of  mouth  MAP: 75-100 mmHg  Patient is currently intubated on ventilator support Ve: 9 L/min Temp (24hrs), Avg:99.1 F (37.3 C), Min:97.8 F (36.6 C), Max:100.2 F (37.9 C)  Propofol: 10.1 mL/hr (267 kcal daily)  Medications reviewed and include: famotidine 20 mg daily per tube, 1/2NS at 125 mL/hr, cefepime, heparin gtt, propofol gtt, vancomycin.  Labs reviewed: Sodium 146, Chloride 114, CO2 21, BUN 35, Creatinine 2.09, eGFR 23.  I/O: 425 mL UOP last night  Discussed with RN and on rounds. Plan is to start tube feeds today.  NUTRITION - FOCUSED PHYSICAL EXAM:    Most Recent Value  Orbital Region  Moderate depletion  Upper Arm Region  Moderate depletion  Thoracic and Lumbar Region  Moderate depletion  Buccal Region  Unable to assess  Temple Region  Severe depletion  Clavicle Bone Region  Moderate depletion  Clavicle and Acromion Bone Region  Moderate depletion  Scapular Bone Region  Unable to assess  Dorsal Hand  Severe depletion  Patellar Region  Moderate depletion  Anterior Thigh Region  Moderate depletion  Posterior Calf Region  Moderate depletion  Edema (RD Assessment)  None  Hair  Reviewed  Eyes  Unable to assess  Mouth  Unable to assess  Skin  Reviewed  Nails  Reviewed     Diet Order:   Diet Order            Diet NPO time specified  Diet effective now              EDUCATION NEEDS:   Not  appropriate for education at this time  Skin:  Skin Assessment: Skin Integrity Issues:(stage II to buttocks; MSAD to bilateral breasts; petechiae to bilateral arms)  Last BM:  Unknown/PTA  Height:   Ht Readings from Last 1 Encounters:  06/24/18 '5\' 4"'  (1.626 m)    Weight:   Wt Readings from Last 1 Encounters:  06/24/18 61.9 kg    Ideal Body Weight:  54.5 kg  BMI:  Body mass index is 23.42 kg/m.  Estimated Nutritional Needs:   Kcal:  1471 (PSU 2003b w/ MSJ 1119, Ve 9, Tmax 37.9)  Protein:  80-95 grams (1.3-1.5 grams/kg)  Fluid:  1.5 L/day (25  mL/kg)  Willey Blade, MS, RD, LDN Office: [phone] Pager: [phone] After Hours/Weekend Pager: [phone]

## 2018-06-25 NOTE — Care Management Note (Signed)
Case Management Note  Patient Details  Name: JIMI GIZA MRN: [id number] Date of Birth: 1946/08/09  Subjective/Objective:         Patient is from home and lives with her daughter.  Patient was recently discharged after CVA to home with home health.  She is open with [facility] for PT, OT, RN, and speech.  Contacted [name] with [facility] to make aware of current admission.  Per the ED notes the patient came in from poor living conditions, possible bed bug infestation, and covered in stool.  [name] from [place] will check with staff that has been out to the home to see if there were any concerns noted.  Home Health cannot go out to a home with bed bugs and there has been no reports of that from anyone providing treatment in the home, and there has not been a CSW consult placed, which would be done in the case of safety concerns or neglect.      RNCM will cont to follow.            Action/Plan:  Consult CSW.    Expected Discharge Date:                  Expected Discharge Plan:     In-House Referral:     Discharge planning Services  CM Consult  Post Acute Care Choice:    Choice offered to:     DME Arranged:    DME Agency:     HH Arranged:    HH Agency:     Status of Service:  In process, will continue to follow  If discussed at Long Length of Stay Meetings, dates discussed:    Additional Comments:  Allayne Butcher, RN 06/25/2018, 11:03 AM

## 2018-06-25 NOTE — Progress Notes (Signed)
ANTICOAGULATION CONSULT NOTE  Pharmacy Consult for heparin Indication: atrial fibrillation  No Known Allergies  Patient Measurements: Height: 5\' 4"  (162.6 cm) Weight: 136 lb 7.4 oz (61.9 kg) IBW/kg (Calculated) : 54.7 Heparin Dosing Weight: 61.9 kg  Vital Signs: Temp: 99.9 F (37.7 C) (10/29 1200) Temp Source: Oral (10/29 1200) BP: 161/58 (10/29 1400) Pulse Rate: 103 (10/29 1400)  Labs: Recent Labs    06/24/18 1327 06/24/18 1942 06/24/18 2250 06/25/18 0334 06/25/18 0410 06/25/18 1310  HGB 12.0  --  12.2 11.4*  --   --   HCT 38.4  --  39.6 37.9  --   --   PLT 236  --  225 213  --   --   APTT  --  43*  --   --  >160* 131*  LABPROT 17.3* 17.4*  --   --   --   --   INR 1.43 1.44  --   --   --   --   HEPARINUNFRC  --  2.32*  --   --   --   --   CREATININE 2.03*  --  2.03* 2.09*  --   --   CKTOTAL  --  25*  --   --  43  --     Estimated Creatinine Clearance: 21 mL/min (A) (by C-G formula based on SCr of 2.09 mg/dL (H)).   Medical History: Past Medical History:  Diagnosis Date  . A-fib (HCC)   . Depression   . Frequent falls   . Hypertension   . Leaking of urine   . Mixed incontinence   . Stroke (HCC)   . Tremor of hands and face     Medications:  Infusions:  . sodium chloride 125 mL/hr at 06/25/18 1400  . sodium chloride Stopped (06/24/18 2155)  . ceFEPime (MAXIPIME) IV Stopped (06/24/18 2234)  . heparin 750 Units/hr (06/25/18 1400)  . propofol (DIPRIVAN) infusion 25 mcg/kg/min (06/25/18 1400)  . vancomycin Stopped (06/25/18 1223)    Assessment: Pharmacy consulted for heparin drip management for 72 yo female admitted to ICU on 10/28 for sepsis. Patient requiring mechanical ventilation and was transitioned to heparin drip from apixaban. Patient with past medical history significant for chronic Afib, CKD 3, and recent discharge for subacute stroke. CHA2DS2-VASc Score of at least 6 (Age > 75, Gender, HTN, Stroke).   Patient with Heparin is currently  infusing at 750 units/hr.   Goal of Therapy:  Heparin level 0.3-0.7 units/ml  APTT: 68-109  Monitor platelets by anticoagulation protocol: Yes   Plan:  Will decrease rate by 100 units/hr to 650 units/hr. Will obtain next aPTT at 2130.   Will obtain anti-Xa level with am labs. Will obtain anti-Xa levels daily until aPTT and anti-Xa levels correlate.   Pharmacy will continue to monitor and adjust per consult.   Luella Gardenhire L 06/25/2018,2:55 PM

## 2018-06-25 NOTE — Progress Notes (Signed)
   Name: Destiny Mack MRN: [id number] DOB: 11-19-1945     CONSULTATION DATE: 06/24/2018  Objective & objectives: Remains on the ventilator sedated with propofol.  On heparin for therapeutic anticoagulation with history of A. fib  PAST MEDICAL HISTORY :   has a past medical history of A-fib (HCC), Depression, Frequent falls, Hypertension, Leaking of urine, Mixed incontinence, Stroke (HCC), and Tremor of hands and face.  has a past surgical history that includes Tubal ligation; Appendectomy; and TEE without cardioversion (N/A, 01/04/2018). Prior to Admission medications   Medication Sig Start Date End Date Taking? Authorizing Provider  amLODipine (NORVASC) 5 MG tablet Take 5 mg by mouth daily.   Yes [provider]  apixaban (ELIQUIS) 5 MG TABS tablet Take 1 tablet (5 mg total) by mouth 2 (two) times daily. 12/15/17 06/24/18 Yes Pyreddy, Vivien Rota, MD  atorvastatin (LIPITOR) 20 MG tablet Take 20 mg by mouth at bedtime.    Yes [provider]   No Known Allergies  FAMILY HISTORY:  family history includes CAD in her father; Dementia in her mother; Heart failure in her father and mother. SOCIAL HISTORY:  reports that she has been smoking. She has a 28.00 pack-year smoking history. She has never used smokeless tobacco. She reports that she does not drink alcohol or use drugs.  REVIEW OF SYSTEMS:   Unable to obtain due to critical illness   VITAL SIGNS: Temp:  [97.8 F (36.6 C)-100.2 F (37.9 C)] 97.8 F (36.6 C) (10/29 0700) Pulse Rate:  [61-100] 100 (10/29 0900) Resp:  [14-30] 30 (10/29 0900) BP: (126-217)/(50-91) 126/82 (10/29 0900) SpO2:  [96 %-100 %] 100 % (10/29 0900) FiO2 (%):  [30 %-40 %] 30 % (10/29 0845) Weight:  [61.9 kg-67.1 kg] 61.9 kg (10/28 1635)  Physical Examination:  Sedated with propofol to RASS of -3.  Grimacing to pain and gagging was tapering of sedation On vent, no distress, bilateral equal air entry with no adventitious sounds S1 & S2 are  audible with no murmur Benign abdominal exam with feeble peristalsis Wasted extremities and no edema  ASSESSMENT / PLAN: Acute respiratory failure intubated for airway protection with altered mental status. -Monitor ABG, optimize ventilator settings and continuous vent support  Altered mental status with toxic metabolic encephalopathy/hypertensive emergency.  No acute intracranial abnormalities on the CT head, bilateral frontal lobe infarct left larger than the right stable multifocal paranasal sinus disease -Monitor neuro status  Hypertensive emergency with altered mental status -Optimize antihypertensives and monitor hemodynamics  Atelectasis and pneumonia.  Improved Left lower airspace disease -Cefepime + vancomycin to cover for HCAP considering recent hospital admission. -Monitor CXR + CBC + FiO2 requirement.  Follow with MRSA PCR and cultures  UTI -Cefepime + vancomycin.  Monitor renal culture  Sepsis is less likely was procalcitonin level less than 0.1. -Empiric antimicrobials, monitor procalcitonin and consider de-escalation of antimicrobial's final culture results.  chronic atrial fibrillation with controlled ventricular rate.  Was on Eliquis.  TEE 12/2017 LVEF 55 to 60% normal systolic function -Rate control and start on heparin for therapeutic anticoagulation while intubated  AKI on CKD stage III with intravascular volume depletion -Optimize hydration, avoid nephrotoxins, monitor renal panel and urine output.  Anemia -Keep hemoglobin more than 7 g/dL  Full code  DVT & GI prophylaxis.  Continue with supportive care. Family was updated at the bedside and they agreed to the plan of management.  Critical care time 40 minutes

## 2018-06-25 NOTE — Progress Notes (Signed)
ANTICOAGULATION CONSULT NOTE - Initial Consult  Pharmacy Consult for heparin Indication: atrial fibrillation  No Known Allergies  Patient Measurements: Height: 5\' 4"  (162.6 cm) Weight: 136 lb 7.4 oz (61.9 kg) IBW/kg (Calculated) : 54.7 Heparin Dosing Weight: 61.9 kg  Vital Signs: Temp: 99.3 F (37.4 C) (10/29 0330) Temp Source: Oral (10/29 0330) BP: 150/54 (10/29 0530) Pulse Rate: 85 (10/29 0530)  Labs: Recent Labs    06/24/18 1327 06/24/18 1942 06/24/18 2250 06/25/18 0334 06/25/18 0410  HGB 12.0  --  12.2 11.4*  --   HCT 38.4  --  39.6 37.9  --   PLT 236  --  225 213  --   APTT  --  43*  --   --  >160*  LABPROT 17.3* 17.4*  --   --   --   INR 1.43 1.44  --   --   --   HEPARINUNFRC  --  2.32*  --   --   --   CREATININE 2.03*  --  2.03* 2.09*  --   CKTOTAL  --  25*  --   --  43    Estimated Creatinine Clearance: 21 mL/min (A) (by C-G formula based on SCr of 2.09 mg/dL (H)).   Medical History: Past Medical History:  Diagnosis Date  . A-fib (HCC)   . Depression   . Frequent falls   . Hypertension   . Leaking of urine   . Mixed incontinence   . Stroke (HCC)   . Tremor of hands and face     Medications:  Infusions:  . sodium chloride 75 mL/hr at 06/25/18 0500  . sodium chloride Stopped (06/24/18 2155)  . ceFEPime (MAXIPIME) IV Stopped (06/24/18 2234)  . famotidine (PEPCID) IV    . heparin    . propofol (DIPRIVAN) infusion 25 mcg/kg/min (06/25/18 0500)  . vancomycin      Assessment: 71 yof cc unresponsive with PMH chronic AF, CKD 3, recent discharge for subacute stroke. Patient takes apixaban PTA but last dose is unknown. Pharmacy consulted to transition from apixaban to heparin. Will check baseline aPTT/INR/HL before starting.   Goal of Therapy:  Heparin level 0.3-0.7 units/ml Monitor platelets by anticoagulation protocol: Yes   Plan:  Give 4000 units bolus x 1 Start heparin infusion at 950 units/hr Check aPTT in 6 hours and daily while on  heparin, monitor daily HL until aPTT and HL correlate Continue to monitor H&H and platelets  10/29 AM aPTT >160. Hold drip x 1 hour and restart at 750 units/hr. Next aPTT 6 hours after restart  Kenichi Cassada S, Pharm.D., BCPS Clinical Pharmacist 06/25/2018,6:02 AM

## 2018-06-25 NOTE — Progress Notes (Addendum)
Social workers Scientist, research (physical sciences) [phone]) and Kallie Edward [phone]) stopped by and would like to be updated if any status change occurs with the patient and if any additional concerns are noticed.

## 2018-06-25 NOTE — Progress Notes (Signed)
Pharmacy Antibiotic Note  Destiny Mack is a 72 y.o. female admitted on 06/24/2018 with sepsis. Patient was hospitalized in September with subacute stoke. Patient has history significant for a.fib and CKD stage III. Patient transferred to ICU from ED requiring mechanical ventilation. Pharmacy has been consulted for vancomycin and cefepime dosing.  Plan: Continue cefepime 1g IV Q24hr.   Vancomycin discontinued secondary to negative MRSA PCR.   Height: 5\' 4"  (162.6 cm) Weight: 136 lb 7.4 oz (61.9 kg) IBW/kg (Calculated) : 54.7  Temp (24hrs), Avg:99.4 F (37.4 C), Min:97.8 F (36.6 C), Max:100.2 F (37.9 C)  Recent Labs  Lab 06/24/18 1327 06/24/18 1942 06/24/18 2250 06/25/18 0334  WBC 14.6*  --  13.6* 13.7*  CREATININE 2.03*  --  2.03* 2.09*  LATICACIDVEN 1.4 1.4  --   --     Estimated Creatinine Clearance: 21 mL/min (A) (by C-G formula based on SCr of 2.09 mg/dL (H)).    No Known Allergies  Antimicrobials this admission: Zosyn 10/28 x 1 Vancomycin 10/28 >> 10/29 Cefepime 10/28 >>   Dose adjustments this admission: N/A  Microbiology results: 10/28 BCx: no growth < 24 hours  10/28 UCx: sent  10/28 MRSA PCR: negative   Thank you for allowing pharmacy to be a part of this patient's care.  Arryn Terrones L 06/25/2018 4:08 PM

## 2018-06-26 ENCOUNTER — Inpatient Hospital Stay (HOSPITAL_COMMUNITY)
Admit: 2018-06-26 | Discharge: 2018-06-26 | Disposition: A | Discharge status: Home / Self Care | Payer: Medicare HMO | Attending: Pulmonary Disease | Admitting: Pulmonary Disease

## 2018-06-26 ENCOUNTER — Inpatient Hospital Stay: Payer: Medicare HMO

## 2018-06-26 DIAGNOSIS — I4891 Unspecified atrial fibrillation: Secondary | ICD-10-CM

## 2018-06-26 DIAGNOSIS — I472 Ventricular tachycardia: Secondary | ICD-10-CM

## 2018-06-26 LAB — CBC WITH DIFFERENTIAL/PLATELET
Abs Immature Granulocytes: 0.05 10*3/uL (ref 0.00–0.07)
Basophils Absolute: 0.1 10*3/uL (ref 0.0–0.1)
Basophils Relative: 0 %
Eosinophils Absolute: 0.1 10*3/uL (ref 0.0–0.5)
Eosinophils Relative: 1 %
HCT: 29.8 % — ABNORMAL LOW (ref 36.0–46.0)
Hemoglobin: 9.3 g/dL — ABNORMAL LOW (ref 12.0–15.0)
Immature Granulocytes: 0 %
Lymphocytes Relative: 10 %
Lymphs Abs: 1.2 10*3/uL (ref 0.7–4.0)
MCH: 30.7 pg (ref 26.0–34.0)
MCHC: 31.2 g/dL (ref 30.0–36.0)
MCV: 98.3 fL (ref 80.0–100.0)
Monocytes Absolute: 1.2 10*3/uL — ABNORMAL HIGH (ref 0.1–1.0)
Monocytes Relative: 9 %
Neutro Abs: 9.9 10*3/uL — ABNORMAL HIGH (ref 1.7–7.7)
Neutrophils Relative %: 80 %
Platelets: 172 10*3/uL (ref 150–400)
RBC: 3.03 MIL/uL — ABNORMAL LOW (ref 3.87–5.11)
RDW: 14.9 % (ref 11.5–15.5)
WBC: 12.5 10*3/uL — ABNORMAL HIGH (ref 4.0–10.5)
nRBC: 0 % (ref 0.0–0.2)

## 2018-06-26 LAB — BLOOD GAS, ARTERIAL
Acid-base deficit: 5.4 mmol/L — ABNORMAL HIGH (ref 0.0–2.0)
Bicarbonate: 20 mmol/L (ref 20.0–28.0)
FIO2: 0.3
MECHVT: 450 mL
O2 Saturation: 99 %
PEEP: 5 cmH2O
Patient temperature: 37
RATE: 16 resp/min
pCO2 arterial: 38 mmHg (ref 32.0–48.0)
pH, Arterial: 7.33 — ABNORMAL LOW (ref 7.350–7.450)
pO2, Arterial: 139 mmHg — ABNORMAL HIGH (ref 83.0–108.0)

## 2018-06-26 LAB — TROPONIN I
TROPONIN I: 0.03 ng/mL — AB (ref <0.03)
Troponin I: 0.05 ng/mL (ref <0.03)
Troponin I: 0.03 ng/mL (ref <0.03)
Troponin I: 0.03 ng/mL (ref <0.03)

## 2018-06-26 LAB — GLUCOSE, CAPILLARY
GLUCOSE-CAPILLARY: 113 mg/dL — AB (ref 70–99)
GLUCOSE-CAPILLARY: 108 mg/dL — AB (ref 70–99)
GLUCOSE-CAPILLARY: 93 mg/dL (ref 70–99)
GLUCOSE-CAPILLARY: 114 mg/dL — AB (ref 70–99)
Glucose-Capillary: 116 mg/dL — ABNORMAL HIGH (ref 70–99)
Glucose-Capillary: 122 mg/dL — ABNORMAL HIGH (ref 70–99)

## 2018-06-26 LAB — BASIC METABOLIC PANEL
Anion gap: 10 (ref 5–15)
BUN: 33 mg/dL — ABNORMAL HIGH (ref 8–23)
CALCIUM: 7.9 mg/dL — AB (ref 8.9–10.3)
CO2: 20 mmol/L — AB (ref 22–32)
CREATININE: 1.52 mg/dL — AB (ref 0.44–1.00)
Chloride: 109 mmol/L (ref 98–111)
GFR calc Af Amer: 38 mL/min — ABNORMAL LOW (ref >60)
GFR, EST NON AFRICAN AMERICAN: 33 mL/min — AB (ref >60)
GLUCOSE: 123 mg/dL — AB (ref 70–99)
Potassium: 3.5 mmol/L (ref 3.5–5.1)
Sodium: 139 mmol/L (ref 135–145)

## 2018-06-26 LAB — PHOSPHORUS: Phosphorus: 3.4 mg/dL (ref 2.5–4.6)

## 2018-06-26 LAB — APTT
APTT: 79 s — AB (ref 24–36)
aPTT: 76 seconds — ABNORMAL HIGH (ref 24–36)
aPTT: 80 seconds — ABNORMAL HIGH (ref 24–36)

## 2018-06-26 LAB — PROCALCITONIN: Procalcitonin: <0.1 ng/mL

## 2018-06-26 LAB — MAGNESIUM: Magnesium: 2 mg/dL (ref 1.7–2.4)

## 2018-06-26 LAB — ECHOCARDIOGRAM COMPLETE
Height: 64 in
WEIGHTICAEL: 2338.64 [oz_av]

## 2018-06-26 MED ORDER — MIDAZOLAM HCL 2 MG/2ML IJ SOLN
INTRAMUSCULAR | Status: AC
  Administered 2018-06-26: 2 mg via INTRAVENOUS
  Filled 2018-06-26: qty 2

## 2018-06-26 MED ORDER — FENTANYL CITRATE (PF) 100 MCG/2ML IJ SOLN
50.0000 ug | INTRAMUSCULAR | Status: DC | PRN
  Administered 2018-06-26 – 2018-06-29 (×6): 50 ug via INTRAVENOUS
  Filled 2018-06-26 (×5): qty 2

## 2018-06-26 MED ORDER — FENTANYL CITRATE (PF) 100 MCG/2ML IJ SOLN
50.0000 ug | INTRAMUSCULAR | Status: AC | PRN
  Administered 2018-06-28 (×3): 50 ug via INTRAVENOUS
  Filled 2018-06-26 (×4): qty 2

## 2018-06-26 MED ORDER — POTASSIUM CHLORIDE 20 MEQ PO PACK
20.0000 meq | PACK | Freq: Once | ORAL | Status: AC
  Administered 2018-06-26: 20 meq
  Filled 2018-06-26: qty 1

## 2018-06-26 MED ORDER — PHENYLEPHRINE HCL-NACL 10-0.9 MG/250ML-% IV SOLN
0.0000 ug/min | INTRAVENOUS | Status: DC
  Administered 2018-06-26: 20 ug/min via INTRAVENOUS
  Filled 2018-06-26 (×2): qty 250

## 2018-06-26 MED ORDER — MIDAZOLAM HCL 2 MG/2ML IJ SOLN
2.0000 mg | Freq: Once | INTRAMUSCULAR | Status: AC
  Administered 2018-06-26: 2 mg via INTRAVENOUS

## 2018-06-26 NOTE — Progress Notes (Signed)
Pt with a 26 beat run of non-stained V-tach. NP notified. STAT labs and EKG ordered. Will continue to monitor.

## 2018-06-26 NOTE — Progress Notes (Signed)
Pharmacy Antibiotic Note  Destiny Mack is a 72 y.o. female admitted on 06/24/2018 with sepsis. Patient was hospitalized in September with subacute stroke. Patient has history significant for a.fib and CKD stage III. Patient transferred to ICU from ED requiring mechanical ventilation, still currently requiring. Pharmacy has been consulted for cefepime dosing.  Plan: Continue cefepime 1g IV Q24hr. Discussed with CCM about narrowing therapy to Ceftriaxone due to culture results. Will continue cefepime therapy today, and reassess patient status on 10/31.   Height: 5\' 4"  (162.6 cm) Weight: 146 lb 2.6 oz (66.3 kg) IBW/kg (Calculated) : 54.7  Temp (24hrs), Avg:99 F (37.2 C), Min:96.4 F (35.8 C), Max:99.9 F (37.7 C)  Recent Labs  Lab 06/24/18 1327 06/24/18 1942 06/24/18 2250 06/25/18 0334 06/26/18 0122  WBC 14.6*  --  13.6* 13.7* 12.5*  CREATININE 2.03*  --  2.03* 2.09* 1.52*  LATICACIDVEN 1.4 1.4  --   --   --     Estimated Creatinine Clearance: 31.3 mL/min (A) (by C-G formula based on SCr of 1.52 mg/dL (H)).    No Known Allergies  Antimicrobials this admission: Zosyn 10/28 x 1 Vancomycin 10/28 >> 10/29 Cefepime 10/28 >>   Dose adjustments this admission: N/A  Microbiology results: 10/28 BCx: no growth x 2 days  10/28 UCx: 100,000 CFU E.coli 10/28 MRSA PCR: negative   Thank you for allowing pharmacy to be a part of this patient's care.   Mauri Reading, PharmD Pharmacy Resident  06/26/2018 1:50 PM

## 2018-06-26 NOTE — Progress Notes (Signed)
Pt with a non-sustained run of V-tach (approx. 26 beats) of which she converted spontaneously.  Will obtain STAT serum potassium, magnesium, troponin, and obtain STAT EKG.

## 2018-06-26 NOTE — Progress Notes (Signed)
EKG without changes concerning for ischemia.  Troponin is 0.05, likely demand ischemia in setting of sepsis.  Potassium is 3.5, and Magnesium is 2.0.  Will give 20 mEq PO Potassium x 1 dose, and will trend Troponin. Continue to monitor for further episodes of V-tach.   Harlon Ditty, AGACNP-BC [facility] Pager: [phone] Cell: [phone]

## 2018-06-26 NOTE — Progress Notes (Signed)
[facility] [place] [facility]   PATIENT NAME: Destiny Mack    MR#:  [id number]  DATE OF BIRTH:  [birth year]  SUBJECTIVE:  CHIEF COMPLAINT:   Chief Complaint  Patient presents with  . Altered Mental Status  remains on vent - sedated, no new issues REVIEW OF SYSTEMS:  Review of Systems  Unable to perform ROS: Intubated   DRUG ALLERGIES:  No Known Allergies VITALS:  Blood pressure (!) 156/86, pulse (!) 102, temperature 98.4 F (36.9 C), temperature source Oral, resp. rate (!) 23, height 5\' 4"  (1.626 m), weight 66.3 kg, SpO2 100 %. PHYSICAL EXAMINATION:  Physical Exam  HENT:  Head: Normocephalic and atraumatic.  Eyes: Pupils are equal, round, and reactive to light. Conjunctivae and EOM are normal.  Neck: Normal range of motion. Neck supple. No tracheal deviation present. No thyromegaly present.  Cardiovascular: Normal rate, regular rhythm and normal heart sounds.  Pulmonary/Chest: Effort normal and breath sounds normal. No respiratory distress. She has no wheezes. She exhibits no tenderness.  Abdominal: Soft. Bowel sounds are normal. She exhibits no distension. There is no tenderness.  Musculoskeletal: Normal range of motion.  Neurological: No cranial nerve deficit.  Sedated on vent  Skin: Skin is warm and dry. No rash noted.  Psychiatric:  Sedated on vent   LABORATORY PANEL:  Female CBC Recent Labs  Lab 06/26/18 0122  WBC 12.5*  HGB 9.3*  HCT 29.8*  PLT 172   ------------------------------------------------------------------------------------------------------------------ Chemistries  Recent Labs  Lab 06/24/18 2250  06/26/18 0122  NA 144   < > 139  K 5.0   < > 3.5  CL 116*   < > 109  CO2 18*   < > 20*  GLUCOSE 120*   < > 123*  BUN 36*   < > 33*  CREATININE 2.03*   < > 1.52*  CALCIUM 8.9   < > 7.9*  MG  --   --  2.0  AST 18  --   --   ALT 11  --   --   ALKPHOS 97  --   --   BILITOT 0.6  --   --    < > = values in this interval not  displayed.   RADIOLOGY:  Dg Chest Port 1 View  Result Date: 06/26/2018 CLINICAL DATA:  Acute onset of respiratory failure. EXAM: PORTABLE CHEST 1 VIEW COMPARISON:  Chest radiograph performed 06/24/2018 FINDINGS: The patient's endotracheal tube is seen ending 1-2 cm above the carina. This could be retracted 1-2 cm. A small left pleural effusion is noted. No pneumothorax is seen. The cardiomediastinal silhouette is normal in size. No acute osseous abnormalities are identified. The patient's enteric tube is noted extending below the carina. IMPRESSION: 1. Endotracheal tube seen ending 1-2 cm above the carina. This could be retracted 1-2 cm. 2. Small left pleural effusion noted. Lungs otherwise grossly clear. Electronically Signed   By: Roanna Raider M.D.   On: 06/26/2018 09:36   ASSESSMENT AND PLAN:  72 year old female with history of chronic atrial fibrillation and recent discharge from the hospital at the end of September with subacute CVA who presented to the ER due to unresponsiveness.  1.  Acute hypoxic respiratory failure: remains intubated and sedated on ventilator. - vent mgmt per intensivist - Consider MRI of the brain   2.  Sepsis: present on admission due to urinary tract infection Continue IV fluids, Cefepime and vancomycin Follow-up on all blood and urine cultures  3.  Acute on chronic kidney disease due to sepsis: Continue IV fluids and repeat BMP in a.m.  4.  Chronic atrial fibrillation: Continue telemetry and monitoring of heart rate and rhythm Patient currently in sinus tachycardia Patient on Eliquis at home  5.  Recent CVA: Continue Eliquis and statin if possible while intubated.  6. 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 Hypertensive emergency with altered mental status -Optimize  antihypertensives and monitor hemodynamics  8. LLL pneumonia - continue Cefepime, stop vancomycin per PCCM  9. AKI on CKD stage III with intravascular volume depletion -avoid nephrotoxins, monitor renal panel and urine output.     All the records are reviewed and case discussed with Care Management/Social Worker. Management plans discussed with the patient, nursing and they are in agreement.  CODE STATUS: Full Code  TOTAL TIME TAKING CARE OF THIS PATIENT: 15 minutes.   More than 50% of the time was spent in counseling/coordination of care: YES  POSSIBLE D/C IN 3-4 DAYS, DEPENDING ON CLINICAL CONDITION.   Delfino Lovett M.D on 06/26/2018 at 1:50 PM  Between 7am to 6pm - Pager - [phone]  After 6pm go to [email or web address] - Social research officer, government  [facility]  Office  [phone]  CC: Primary care physician; Oswaldo Conroy, MD  Note: This dictation was prepared with Dragon dictation along with smaller phrase technology. Any transcriptional errors that result from this process are unintentional.

## 2018-06-26 NOTE — Progress Notes (Signed)
*  PRELIMINARY RESULTS* Echocardiogram 2D Echocardiogram has been performed.  Destiny Mack 06/26/2018, 2:32 PM

## 2018-06-26 NOTE — Progress Notes (Addendum)
ANTICOAGULATION CONSULT NOTE  Pharmacy Consult for heparin Indication: atrial fibrillation  No Known Allergies  Patient Measurements: Height: 5\' 4"  (162.6 cm) Weight: 146 lb 2.6 oz (66.3 kg) IBW/kg (Calculated) : 54.7 Heparin Dosing Weight: 61.9 kg  Vital Signs: Temp: 97.9 F (36.6 C) (10/30 0559) Temp Source: Axillary (10/30 0559) BP: 100/45 (10/30 1000) Pulse Rate: 77 (10/30 1000)  Labs: Recent Labs    06/24/18 1327  06/24/18 1942 06/24/18 2250 06/25/18 0334 06/25/18 0410  06/25/18 2125 06/26/18 0122 06/26/18 0709 06/26/18 0927  HGB 12.0  --   --  12.2 11.4*  --   --   --  9.3*  --   --   HCT 38.4  --   --  39.6 37.9  --   --   --  29.8*  --   --   PLT 236  --   --  225 213  --   --   --  172  --   --   APTT  --    < > 43*  --   --  >160*   < > 110* 76*  --  80*  LABPROT 17.3*  --  17.4*  --   --   --   --   --   --   --   --   INR 1.43  --  1.44  --   --   --   --   --   --   --   --   HEPARINUNFRC  --   --  2.32*  --   --   --   --   --   --   --   --   CREATININE 2.03*  --   --  2.03* 2.09*  --   --   --  1.52*  --   --   CKTOTAL  --   --  25*  --   --  43  --   --   --   --   --   TROPONINI  --   --   --   --   --   --   --   --  0.05* 0.03*  --    < > = values in this interval not displayed.    Estimated Creatinine Clearance: 31.3 mL/min (A) (by C-G formula based on SCr of 1.52 mg/dL (H)).   Medical History: Past Medical History:  Diagnosis Date  . A-fib (HCC)   . Depression   . Frequent falls   . Hypertension   . Leaking of urine   . Mixed incontinence   . Stroke (HCC)   . Tremor of hands and face     Medications:  Infusions:  . sodium chloride 125 mL/hr at 06/26/18 0323  . sodium chloride Stopped (06/24/18 2155)  . ceFEPime (MAXIPIME) IV Stopped (06/25/18 2257)  . heparin 550 Units/hr (06/26/18 0559)  . phenylephrine (NEO-SYNEPHRINE) Adult infusion Stopped (06/26/18 0604)  . propofol (DIPRIVAN) infusion 20 mcg/kg/min (06/26/18 1610)     Assessment: Pharmacy consulted for heparin drip management for 72 yo female admitted to ICU on 10/28 for sepsis. Patient requiring mechanical ventilation and was transitioned to heparin drip from apixaban. Patient with past medical history significant for chronic Afib, CKD 3, and recent discharge for subacute stroke. CHA2DS2-VASc Score of at least 6 (Age > 75, Gender, HTN, Stroke).   Patient with Heparin is currently infusing at 750 units/hr.   Goal of Therapy:  Heparin  level 0.3-0.7 units/ml  APTT: 68-109  Monitor platelets by anticoagulation protocol: Yes   Plan:  Continue heparin @ 550 units/hr. Past 3 APTTs have been within range. Will obtain next aPTT with AM labs.  Will also obtain anti-Xa level with am labs. Will obtain anti-Xa levels daily until aPTT and anti-Xa levels correlate.   Pharmacy will continue to monitor and adjust per consult.    Mauri Reading, PharmD Pharmacy Resident  06/26/2018 11:32 AM

## 2018-06-26 NOTE — Progress Notes (Signed)
   Name: Destiny Mack MRN: [id number] DOB: [birth year]     CONSULTATION DATE: 06/24/2018 Subjective & objectives: Vent, propofol and heparin drip.  One episode of nonsustained V. tach last night   PAST MEDICAL HISTORY :   has a past medical history of A-fib (HCC), Depression, Frequent falls, Hypertension, Leaking of urine, Mixed incontinence, Stroke (HCC), and Tremor of hands and face.  has a past surgical history that includes Tubal ligation; Appendectomy; and TEE without cardioversion (N/A, 01/04/2018). Prior to Admission medications   Medication Sig Start Date End Date Taking? Authorizing Provider  amLODipine (NORVASC) 5 MG tablet Take 5 mg by mouth daily.   Yes [provider]  apixaban (ELIQUIS) 5 MG TABS tablet Take 1 tablet (5 mg total) by mouth 2 (two) times daily. 12/15/17 06/24/18 Yes Pyreddy, Vivien Rota, MD  atorvastatin (LIPITOR) 20 MG tablet Take 20 mg by mouth at bedtime.    Yes [provider]   No Known Allergies  FAMILY HISTORY:  family history includes CAD in her father; Dementia in her mother; Heart failure in her father and mother. SOCIAL HISTORY:  reports that she has been smoking. She has a 28.00 pack-year smoking history. She has never used smokeless tobacco. She reports that she does not drink alcohol or use drugs.  REVIEW OF SYSTEMS:   Unable to obtain due to critical illness   VITAL SIGNS: Temp:  [96.4 F (35.8 C)-99.9 F (37.7 C)] 97.9 F (36.6 C) (10/30 0559) Pulse Rate:  [71-149] 107 (10/30 0800) Resp:  [16-39] 25 (10/30 0800) BP: (93-187)/(40-126) 135/71 (10/30 0800) SpO2:  [97 %-100 %] 99 % (10/30 0800) FiO2 (%):  [30 %] 30 % (10/30 0800) Weight:  [66.3 kg] 66.3 kg (10/30 0500)  Physical Examination: Sedated with propofol toRASS of -3.  Grimacing to pain and gagging  tapering of sedation On vent, no distress, bilateral equal air entry with no adventitious sounds S1 & S2 are audible with no murmur Benign abdominal exam with  feeble peristalsis Wasted extremities and no edema  ASSESSMENT / PLAN: Acute respiratory failure intubated for airway protection with altered mental status. 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  ImprovedLeft lower airspace disease -c/w Cefepime + d/c vancomycin. MRSA PCR -ve -Monitor CXR + CBC + FiO2 requirement. Follow with MRSA PCR and cultures  UTI GNR -c/w Cefepime +d/c vancomycin. Monitor renal culture  Sepsis is less likely was procalcitonin level less than 0.1. -Empiric antimicrobials, monitor procalcitonin and consider de-escalation of antimicrobial's final culture results.  chronic atrial fibrillation with controlled ventricular rate.Was on Eliquis. TEE 12/2017 LVEF 55 to 60% normal systolic function -Rate control and start on heparin for therapeutic anticoagulation while intubated  AKI ( improved) on CKD stage III with intravascular volume depletion -Optimize hydration, avoid nephrotoxins, monitor renal panel and urine output.  Anemia -Keep hemoglobin more than 7 g/dL  Full code  DVT &GI prophylaxis. Continue with supportive care. Family was updated at the bedside and they agreed to the plan of management.  Critical care time 40 minutes

## 2018-06-27 ENCOUNTER — Inpatient Hospital Stay: Payer: Self-pay

## 2018-06-27 ENCOUNTER — Inpatient Hospital Stay: Payer: Medicare HMO

## 2018-06-27 LAB — BASIC METABOLIC PANEL
Anion gap: 5 (ref 5–15)
BUN: 31 mg/dL — ABNORMAL HIGH (ref 8–23)
CO2: 20 mmol/L — ABNORMAL LOW (ref 22–32)
Calcium: 7.3 mg/dL — ABNORMAL LOW (ref 8.9–10.3)
Chloride: 110 mmol/L (ref 98–111)
Creatinine, Ser: 1.23 mg/dL — ABNORMAL HIGH (ref 0.44–1.00)
GFR calc Af Amer: 50 mL/min — ABNORMAL LOW (ref >60)
GFR calc non Af Amer: 43 mL/min — ABNORMAL LOW (ref >60)
GLUCOSE: 90 mg/dL (ref 70–99)
Potassium: 3.6 mmol/L (ref 3.5–5.1)
SODIUM: 135 mmol/L (ref 135–145)

## 2018-06-27 LAB — CBC WITH DIFFERENTIAL/PLATELET
Abs Immature Granulocytes: 0.03 10*3/uL (ref 0.00–0.07)
BASOS ABS: 0.1 10*3/uL (ref 0.0–0.1)
Basophils Relative: 1 %
EOS PCT: 3 %
Eosinophils Absolute: 0.2 10*3/uL (ref 0.0–0.5)
HEMATOCRIT: 23.3 % — AB (ref 36.0–46.0)
HEMOGLOBIN: 7 g/dL — AB (ref 12.0–15.0)
IMMATURE GRANULOCYTES: 0 %
LYMPHS ABS: 1.1 10*3/uL (ref 0.7–4.0)
LYMPHS PCT: 14 %
MCH: 29.8 pg (ref 26.0–34.0)
MCHC: 30 g/dL (ref 30.0–36.0)
MCV: 99.1 fL (ref 80.0–100.0)
Monocytes Absolute: 0.8 10*3/uL (ref 0.1–1.0)
Monocytes Relative: 10 %
NEUTROS ABS: 5.7 10*3/uL (ref 1.7–7.7)
NEUTROS PCT: 72 %
NRBC: 0 % (ref 0.0–0.2)
Platelets: 125 10*3/uL — ABNORMAL LOW (ref 150–400)
RBC: 2.35 MIL/uL — ABNORMAL LOW (ref 3.87–5.11)
RDW: 15.5 % (ref 11.5–15.5)
WBC: 7.9 10*3/uL (ref 4.0–10.5)

## 2018-06-27 LAB — GLUCOSE, CAPILLARY
GLUCOSE-CAPILLARY: 113 mg/dL — AB (ref 70–99)
GLUCOSE-CAPILLARY: 93 mg/dL (ref 70–99)
GLUCOSE-CAPILLARY: 94 mg/dL (ref 70–99)
Glucose-Capillary: 78 mg/dL (ref 70–99)
Glucose-Capillary: 124 mg/dL — ABNORMAL HIGH (ref 70–99)
Glucose-Capillary: 95 mg/dL (ref 70–99)
Glucose-Capillary: 107 mg/dL — ABNORMAL HIGH (ref 70–99)

## 2018-06-27 LAB — URINE CULTURE
Culture: >=100000 — AB
Special Requests: NORMAL

## 2018-06-27 LAB — HEMOGLOBIN AND HEMATOCRIT, BLOOD
HCT: 26.9 % — ABNORMAL LOW (ref 36.0–46.0)
HEMATOCRIT: 27.1 % — AB (ref 36.0–46.0)
HEMOGLOBIN: 8.4 g/dL — AB (ref 12.0–15.0)
Hemoglobin: 8.1 g/dL — ABNORMAL LOW (ref 12.0–15.0)

## 2018-06-27 LAB — PHOSPHORUS: Phosphorus: 3.1 mg/dL (ref 2.5–4.6)

## 2018-06-27 LAB — APTT: aPTT: 71 seconds — ABNORMAL HIGH (ref 24–36)

## 2018-06-27 LAB — HEPARIN LEVEL (UNFRACTIONATED): HEPARIN UNFRACTIONATED: 0.91 [IU]/mL — AB (ref 0.30–0.70)

## 2018-06-27 LAB — MAGNESIUM: Magnesium: 2.1 mg/dL (ref 1.7–2.4)

## 2018-06-27 LAB — TYPE AND SCREEN
ABO/RH(D): A NEG
ANTIBODY SCREEN: NEGATIVE

## 2018-06-27 LAB — TRIGLYCERIDES: Triglycerides: 92 mg/dL (ref <150)

## 2018-06-27 LAB — ABO/RH: ABO/RH(D): A NEG

## 2018-06-27 MED ORDER — NOREPINEPHRINE 16 MG/250ML-% IV SOLN
0.0000 ug/min | INTRAVENOUS | Status: DC
  Administered 2018-06-27: 2 ug/min via INTRAVENOUS
  Filled 2018-06-27: qty 250

## 2018-06-27 MED ORDER — SODIUM CHLORIDE 0.9% IV SOLUTION: Freq: Once | INTRAVENOUS | Status: DC

## 2018-06-27 MED ORDER — SODIUM CHLORIDE 0.9% FLUSH: 10.0000 mL | INTRAVENOUS | Status: DC | PRN

## 2018-06-27 MED ORDER — POTASSIUM CHLORIDE 20 MEQ PO PACK
40.0000 meq | PACK | Freq: Once | ORAL | Status: AC
  Administered 2018-06-27: 40 meq
  Filled 2018-06-27: qty 2

## 2018-06-27 MED ORDER — CALCIUM GLUCONATE-NACL 1-0.675 GM/50ML-% IV SOLN
1.0000 g | Freq: Once | INTRAVENOUS | Status: AC
  Administered 2018-06-27: 1000 mg via INTRAVENOUS
  Filled 2018-06-27: qty 50

## 2018-06-27 MED ORDER — SODIUM CHLORIDE 0.9% FLUSH
10.0000 mL | Freq: Two times a day (BID) | INTRAVENOUS | Status: DC
  Administered 2018-06-27 – 2018-06-29 (×6): 10 mL
  Administered 2018-06-29: 20 mL
  Administered 2018-06-30 (×2): 10 mL
  Administered 2018-07-01: 30 mL
  Administered 2018-07-01: 10 mL
  Administered 2018-07-02: 30 mL
  Administered 2018-07-02 – 2018-07-03 (×2): 10 mL

## 2018-06-27 MED ORDER — CEFAZOLIN SODIUM-DEXTROSE 1-4 GM/50ML-% IV SOLN
1.0000 g | Freq: Three times a day (TID) | INTRAVENOUS | Status: DC
  Administered 2018-06-27 – 2018-06-28 (×2): 1 g via INTRAVENOUS
  Filled 2018-06-27 (×4): qty 50

## 2018-06-27 NOTE — Progress Notes (Signed)
Pharmacy Antibiotic Note  Destiny Mack is a 72 y.o. female admitted on 06/24/2018 with sepsis. Patient was hospitalized in September with subacute stroke. Patient has history significant for a.fib and CKD stage III. Patient transferred to ICU from ED requiring mechanical ventilation, still currently requiring. Pharmacy has been consulted for cefepime dosing.  Plan: Discontinue Cefepime and initiate Cefazolin IV 1 g q8h  Per CCM discussion.   Height: 5\' 4"  (162.6 cm) Weight: 153 lb 10.6 oz (69.7 kg) IBW/kg (Calculated) : 54.7  Temp (24hrs), Avg:97.9 F (36.6 C), Min:96.9 F (36.1 C), Max:99.7 F (37.6 C)  Recent Labs  Lab 06/24/18 1327 06/24/18 1942 06/24/18 2250 06/25/18 0334 06/26/18 0122 06/27/18 0427  WBC 14.6*  --  13.6* 13.7* 12.5* 7.9  CREATININE 2.03*  --  2.03* 2.09* 1.52* 1.23*  LATICACIDVEN 1.4 1.4  --   --   --   --     Estimated Creatinine Clearance: 39.6 mL/min (A) (by C-G formula based on SCr of 1.23 mg/dL (H)).    No Known Allergies  Antimicrobials this admission: Zosyn 10/28 x 1 Vancomycin 10/28 >> 10/29 Cefepime 10/28 >>   Dose adjustments this admission: N/A  Microbiology results: 10/28 BCx: no growth x 3 days  10/28 UCx: 100,000 CFU E.coli resistant to ampicillin, ciprofloxacin, and unasyn 10/28 MRSA PCR: negative   Thank you for allowing pharmacy to be a part of this patient's care.   Mauri Reading, PharmD Pharmacy Resident  06/27/2018 4:37 PM

## 2018-06-27 NOTE — Progress Notes (Signed)
[facility] - [place] [facility]   PATIENT NAME: Destiny Mack    MR#:  [id number]  DATE OF BIRTH:  May 24, 1946  SUBJECTIVE:  CHIEF COMPLAINT:   Chief Complaint  Patient presents with  . Altered Mental Status  remains on vent - sedated with propofol REVIEW OF SYSTEMS:  Review of Systems  Unable to perform ROS: Intubated   DRUG ALLERGIES:  No Known Allergies VITALS:  Blood pressure (!) 133/52, pulse (!) 130, temperature 98 F (36.7 C), temperature source Axillary, resp. rate (!) 23, height 5\' 4"  (1.626 m), weight 69.7 kg, SpO2 99 %. PHYSICAL EXAMINATION:  Physical Exam  HENT:  Head: Normocephalic and atraumatic.  Eyes: Pupils are equal, round, and reactive to light. Conjunctivae and EOM are normal.  Neck: Normal range of motion. Neck supple. No tracheal deviation present. No thyromegaly present.  Cardiovascular: Normal rate, regular rhythm and normal heart sounds.  Pulmonary/Chest: Effort normal and breath sounds normal. No respiratory distress. She has no wheezes. She exhibits no tenderness.  Abdominal: Soft. Bowel sounds are normal. She exhibits no distension. There is no tenderness.  Musculoskeletal: Normal range of motion.  Neurological: No cranial nerve deficit.  Sedated on vent  Skin: Skin is warm and dry. No rash noted.  Psychiatric:  Sedated on vent   LABORATORY PANEL:  Female CBC Recent Labs  Lab 06/27/18 0427  06/27/18 1831  WBC 7.9  --   --   HGB 7.0*   < > 8.4*  HCT 23.3*   < > 26.9*  PLT 125*  --   --    < > = values in this interval not displayed.   ------------------------------------------------------------------------------------------------------------------ Chemistries  Recent Labs  Lab 06/24/18 2250  06/27/18 0427  NA 144   < > 135  K 5.0   < > 3.6  CL 116*   < > 110  CO2 18*   < > 20*  GLUCOSE 120*   < > 90  BUN 36*   < > 31*  CREATININE 2.03*   < > 1.23*  CALCIUM 8.9   < > 7.3*  MG  --    < > 2.1  AST 18  --   --    ALT 11  --   --   ALKPHOS 97  --   --   BILITOT 0.6  --   --    < > = values in this interval not displayed.   RADIOLOGY:  Dg Chest Port 1 View  Result Date: 06/27/2018 CLINICAL DATA:  Intubated patient.  Follow-up study. EXAM: PORTABLE CHEST 1 VIEW COMPARISON:  06/26/2018 FINDINGS: Endotracheal tube tip projects 2 cm above the Carina. Nasal/orogastric tube passes below the diaphragm into the stomach. New right PICC line tip projects just above the caval atrial junction. Left pleural effusion noted on the previous day's study is not well-defined. Are prominent bronchovascular markings. Lungs otherwise clear with no evidence of pulmonary edema. No pneumothorax. IMPRESSION: 1. Support apparatus is well positioned. 2. Small left pleural effusion defined on the current AP portable chest radiograph. No acute findings in the lungs. No pneumothorax. Electronically Signed   By: Amie Portland M.D.   On: 06/27/2018 06:44   [place] Ekg Site Rite  Result Date: 06/27/2018 If Site Rite image not attached, placement could not be confirmed due to current cardiac rhythm.  [place] Ekg Site Rite  Result Date: 06/27/2018 If Site Rite image not attached, placement could not be confirmed due to current cardiac  rhythm.  ASSESSMENT AND PLAN:  72 year old female with history of chronic atrial fibrillation and recent discharge from the hospital at the end of September with subacute CVA who presented to the ER due to unresponsiveness.  1.  Acute hypoxic respiratory failure: remains intubated and sedated on ventilator. - vent mgmt per intensivist - spontaneous trial soon  2.  Sepsis: present on admission due to urinary tract infection Continue IV fluids, Ancef Follow-up on all blood and urine cultures  3.  Acute on chronic kidney disease due to sepsis: - improving with hydration  4.  Chronic atrial fibrillation: Continue telemetry and monitoring of heart rate and rhythm Patient currently in sinus  tachycardia Patient on Eliquis at home, on heparin drip here  5.  Recent CVA: Continue Eliquis and statin if possible once extubated  6. 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 Hypertensive emergency with altered mental status -Optimize antihypertensives and monitor hemodynamics  8. LLL pneumonia - on Ancef per PCCM  9. AKI on CKD stage III with intravascular volume depletion -avoid nephrotoxins, monitor renal panel and urine output.     All the records are reviewed and case discussed with Care Management/Social Worker. Management plans discussed with the patient, nursing and they are in agreement.  CODE STATUS: Full Code  TOTAL TIME TAKING CARE OF THIS PATIENT: 15 minutes.   More than 50% of the time was spent in counseling/coordination of care: YES  POSSIBLE D/C IN 3-4 DAYS, DEPENDING ON CLINICAL CONDITION.   Delfino Lovett M.D on 06/27/2018 at 7:00 PM  Between 7am to 6pm - Pager - [phone]  After 6pm go to [email or web address] - Social research officer, government  [facility] [place] [facility]  Office  [phone]  CC: Primary care physician; Oswaldo Conroy, MD  Note: This dictation was prepared with Dragon dictation along with smaller phrase technology. Any transcriptional errors that result from this process are unintentional.

## 2018-06-27 NOTE — Progress Notes (Signed)
Peripherally Inserted Central Catheter/Midline Placement  The IV Nurse has discussed with the patient and/or persons authorized to consent for the patient, the purpose of this procedure and the potential benefits and risks involved with this procedure.  The benefits include less needle sticks, lab draws from the catheter, and the patient may be discharged home with the catheter. Risks include, but not limited to, infection, bleeding, blood clot (thrombus formation), and puncture of an artery; nerve damage and irregular heartbeat and possibility to perform a PICC exchange if needed/ordered by physician.  Alternatives to this procedure were also discussed.  Bard Power PICC patient education guide, fact sheet on infection prevention and patient information card has been provided to patient /or left at bedside.    PICC/Midline Placement Documentation  PICC Triple Lumen 06/27/18 PICC Right Basilic 39 cm 0 cm (Active)  Indication for Insertion or Continuance of Line Vasoactive infusions 06/27/2018  2:05 AM  Exposed Catheter (cm) 0 cm 06/27/2018  2:05 AM  Site Assessment Clean;Dry;Intact 06/27/2018  2:05 AM  Lumen #1 Status Blood return noted;Flushed;Saline locked 06/27/2018  2:05 AM  Lumen #2 Status Blood return noted;Flushed;Saline locked 06/27/2018  2:05 AM  Lumen #3 Status Blood return noted;Flushed;Saline locked 06/27/2018  2:05 AM  Dressing Type Transparent;Securing device 06/27/2018  2:05 AM  Dressing Status Clean;Dry;Intact;Antimicrobial disc in place 06/27/2018  2:05 AM  Line Adjustment (NICU/IV Team Only) No 06/27/2018  2:05 AM  Dressing Intervention New dressing 06/27/2018  2:05 AM  Dressing Change Due 07/04/18 06/27/2018  2:05 AM       Netta Corrigan L 06/27/2018, 2:19 AM

## 2018-06-27 NOTE — Progress Notes (Addendum)
Pharmacy Electrolyte Monitoring Consult:  Pharmacy consulted to assist in monitoring and replacing electrolytes in this 72 y.o. female admitted on 06/24/2018 with sepsis. Patient is currently intubated.  Labs:  Sodium (mmol/L)  Date Value  06/27/2018 135   Potassium (mmol/L)  Date Value  06/27/2018 3.6   Magnesium (mg/dL)  Date Value  16/05/9603 2.1   Phosphorus (mg/dL)  Date Value  54/04/8118 3.1   Calcium (mg/dL)  Date Value  14/78/2956 7.3 (L)   Albumin (g/dL)  Date Value  21/30/8657 3.6   Corrected Calcium: 7.6  Assessment/Plan: Sodium Chloride 0.45% discontinued on AM ICU rounds.   Will order calcium gluconate 1g IV x 1. Will order potassium VT x 1.   Per ICU rounds will replace for goal potassium ~ 4, goal magnesium ~2, and goal corrected calcium > 8.5.   Will obtain follow up electrolytes with am labs.   Pharmacy will continue to monitor and adjust per consult.   Simpson,Michael L 06/27/2018 10:17 AM

## 2018-06-27 NOTE — Progress Notes (Signed)
   Name: Destiny Mack MRN: [id number] DOB: [birth year]     CONSULTATION DATE: 06/24/2018  Subjective & objectives: Sedated with propofol, heparin drip for A. fib and afebrile  PAST MEDICAL HISTORY :   has a past medical history of A-fib (HCC), Depression, Frequent falls, Hypertension, Leaking of urine, Mixed incontinence, Stroke (HCC), and Tremor of hands and face.  has a past surgical history that includes Tubal ligation; Appendectomy; and TEE without cardioversion (N/A, 01/04/2018). Prior to Admission medications   Medication Sig Start Date End Date Taking? Authorizing Provider  amLODipine (NORVASC) 5 MG tablet Take 5 mg by mouth daily.   Yes [provider]  apixaban (ELIQUIS) 5 MG TABS tablet Take 1 tablet (5 mg total) by mouth 2 (two) times daily. 12/15/17 06/24/18 Yes Pyreddy, Vivien Rota, MD  atorvastatin (LIPITOR) 20 MG tablet Take 20 mg by mouth at bedtime.    Yes [provider]   No Known Allergies  FAMILY HISTORY:  family history includes CAD in her father; Dementia in her mother; Heart failure in her father and mother. SOCIAL HISTORY:  reports that she has been smoking. She has a 28.00 pack-year smoking history. She has never used smokeless tobacco. She reports that she does not drink alcohol or use drugs.  REVIEW OF SYSTEMS:   Unable to obtain due to critical illness   VITAL SIGNS: Temp:  [96.9 F (36.1 C)-99.7 F (37.6 C)] 97.1 F (36.2 C) (10/31 0700) Pulse Rate:  [57-118] 60 (10/31 0800) Resp:  [16-40] 16 (10/31 0800) BP: (77-156)/(38-100) 126/47 (10/31 0800) SpO2:  [98 %-100 %] 100 % (10/31 0800) FiO2 (%):  [30 %] 30 % (10/31 0400) Weight:  [69.7 kg] 69.7 kg (10/31 0500)   Physical Examination: Sedated with propofol toRASS of-3.Grimacing to pain and gagging  tapering of sedation On vent, no distress, bilateral equal air entry with no adventitious sounds S1 & S2 are audible with no murmur Benign abdominal exam with feeble  peristalsis Wasted extremities and no edema  ASSESSMENT / PLAN: Acute respiratory failure intubated for airway protection with altered mental status.   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 MRSA PCR -ve -Monitor CXR + CBC + FiO2 requirement. Follow with MRSA PCR and cultures  UTI. E Coli -c/w Cefepime +d/c vancomycin. Monitor renal culture  Sepsisis less likely was procalcitonin level less than 0.1. -Empiric antimicrobials, monitor procalcitonin and consider de-escalation of antimicrobial's final culture results.  chronic atrial fibrillation with controlled ventricular rate.Was on Eliquis. TEE 12/2017 LVEF 55 to 60% normal systolic function -Rate control and start on heparin for therapeutic anticoagulation while intubated  AKI ( improved) on CKD stage III with intravascular volume depletion -Optimize hydration, avoid nephrotoxins, monitor renal panel and urine output.  Anemia -Keep hemoglobin more than 7 g/dL  Full code  DVT &GI prophylaxis. Continue with supportive care. Family was updated at the bedside and they agreed to the plan of management.  Critical care time60minutes

## 2018-06-27 NOTE — Progress Notes (Signed)
ANTICOAGULATION CONSULT NOTE  Pharmacy Consult for heparin Indication: atrial fibrillation  No Known Allergies  Patient Measurements: Height: 5\' 4"  (162.6 cm) Weight: 153 lb 10.6 oz (69.7 kg) IBW/kg (Calculated) : 54.7 Heparin Dosing Weight: 61.9 kg  Vital Signs: Temp: 96.9 F (36.1 C) (10/31 0400) Temp Source: Axillary (10/31 0400) BP: 107/44 (10/31 0630) Pulse Rate: 62 (10/31 0630)  Labs: Recent Labs    06/24/18 1327  06/24/18 1942  06/25/18 0334 06/25/18 0410  06/26/18 0122 06/26/18 0709 06/26/18 0927 06/26/18 1338 06/26/18 1500 06/26/18 1920 06/27/18 0427 06/27/18 0603  HGB 12.0  --   --    < > 11.4*  --   --  9.3*  --   --   --   --   --  7.0* 8.1*  HCT 38.4  --   --    < > 37.9  --   --  29.8*  --   --   --   --   --  23.3* 27.1*  PLT 236  --   --    < > 213  --   --  172  --   --   --   --   --  125*  --   APTT  --    < > 43*  --   --  >160*   < > 76*  --  80*  --  79*  --   --  71*  LABPROT 17.3*  --  17.4*  --   --   --   --   --   --   --   --   --   --   --   --   INR 1.43  --  1.44  --   --   --   --   --   --   --   --   --   --   --   --   HEPARINUNFRC  --   --  2.32*  --   --   --   --   --   --   --   --   --   --  0.91*  --   CREATININE 2.03*  --   --    < > 2.09*  --   --  1.52*  --   --   --   --   --  1.23*  --   CKTOTAL  --   --  25*  --   --  43  --   --   --   --   --   --   --   --   --   TROPONINI  --   --   --   --   --   --    < > 0.05* 0.03*  --  0.03*  --  0.03*  --   --    < > = values in this interval not displayed.    Estimated Creatinine Clearance: 39.6 mL/min (A) (by C-G formula based on SCr of 1.23 mg/dL (H)).   Medical History: Past Medical History:  Diagnosis Date  . A-fib (HCC)   . Depression   . Frequent falls   . Hypertension   . Leaking of urine   . Mixed incontinence   . Stroke (HCC)   . Tremor of hands and face     Medications:  Infusions:  . sodium chloride 125 mL/hr at 06/27/18 0610  .  sodium chloride  Stopped (06/27/18 0448)  . ceFEPime (MAXIPIME) IV Stopped (06/26/18 2239)  . heparin 550 Units/hr (06/27/18 0610)  . norepinephrine (LEVOPHED) Adult infusion 4 mcg/min (06/27/18 0444)  . propofol (DIPRIVAN) infusion 30 mcg/kg/min (06/27/18 1610)    Assessment: Pharmacy consulted for heparin drip management for 72 yo female admitted to ICU on 10/28 for sepsis. Patient requiring mechanical ventilation and was transitioned to heparin drip from apixaban. Patient with past medical history significant for chronic Afib, CKD 3, and recent discharge for subacute stroke. CHA2DS2-VASc Score of at least 6 (Age > 75, Gender, HTN, Stroke).   Patient with Heparin is currently infusing at 750 units/hr.   Goal of Therapy:  Heparin level 0.3-0.7 units/ml  APTT: 68-109  Monitor platelets by anticoagulation protocol: Yes   Plan:  Continue heparin @ 550 units/hr. Past 3 APTTs have been within range. Will obtain next aPTT with AM labs.  Will also obtain anti-Xa level with am labs. Will obtain anti-Xa levels daily until aPTT and anti-Xa levels correlate.   Pharmacy will continue to monitor and adjust per consult.   10/31 AM aPTT 71, heparin level 0.91. Continue current regimen. Recheck aPTT, heparin level, and CBC with tomorrow AM labs.   Fulton Reek, PharmD, BCPS  06/27/18 6:44 AM

## 2018-06-28 LAB — CBC WITH DIFFERENTIAL/PLATELET
ABS IMMATURE GRANULOCYTES: 0.06 10*3/uL (ref 0.00–0.07)
Basophils Absolute: 0.1 10*3/uL (ref 0.0–0.1)
Basophils Relative: 1 %
Eosinophils Absolute: 0.4 10*3/uL (ref 0.0–0.5)
Eosinophils Relative: 3 %
HCT: 28.7 % — ABNORMAL LOW (ref 36.0–46.0)
HEMOGLOBIN: 8.7 g/dL — AB (ref 12.0–15.0)
Immature Granulocytes: 1 %
LYMPHS PCT: 12 %
Lymphs Abs: 1.2 10*3/uL (ref 0.7–4.0)
MCH: 30.1 pg (ref 26.0–34.0)
MCHC: 30.3 g/dL (ref 30.0–36.0)
MCV: 99.3 fL (ref 80.0–100.0)
MONOS PCT: 10 %
Monocytes Absolute: 1 10*3/uL (ref 0.1–1.0)
NEUTROS ABS: 7.7 10*3/uL (ref 1.7–7.7)
Neutrophils Relative %: 73 %
Platelets: 171 10*3/uL (ref 150–400)
RBC: 2.89 MIL/uL — ABNORMAL LOW (ref 3.87–5.11)
RDW: 15.6 % — ABNORMAL HIGH (ref 11.5–15.5)
WBC: 10.5 10*3/uL (ref 4.0–10.5)
nRBC: 0 % (ref 0.0–0.2)

## 2018-06-28 LAB — BASIC METABOLIC PANEL
Anion gap: 9 (ref 5–15)
BUN: 36 mg/dL — AB (ref 8–23)
CALCIUM: 8.9 mg/dL (ref 8.9–10.3)
CO2: 20 mmol/L — AB (ref 22–32)
CREATININE: 1.32 mg/dL — AB (ref 0.44–1.00)
Chloride: 116 mmol/L — ABNORMAL HIGH (ref 98–111)
GFR calc Af Amer: 45 mL/min — ABNORMAL LOW (ref >60)
GFR calc non Af Amer: 39 mL/min — ABNORMAL LOW (ref >60)
GLUCOSE: 99 mg/dL (ref 70–99)
Potassium: 4.5 mmol/L (ref 3.5–5.1)
Sodium: 145 mmol/L (ref 135–145)

## 2018-06-28 LAB — HEPARIN LEVEL (UNFRACTIONATED)
Heparin Unfractionated: 0.85 IU/mL — ABNORMAL HIGH (ref 0.30–0.70)
Heparin Unfractionated: 0.72 IU/mL — ABNORMAL HIGH (ref 0.30–0.70)

## 2018-06-28 LAB — GLUCOSE, CAPILLARY
GLUCOSE-CAPILLARY: 90 mg/dL (ref 70–99)
GLUCOSE-CAPILLARY: 106 mg/dL — AB (ref 70–99)
GLUCOSE-CAPILLARY: 102 mg/dL — AB (ref 70–99)
Glucose-Capillary: 98 mg/dL (ref 70–99)
Glucose-Capillary: 109 mg/dL — ABNORMAL HIGH (ref 70–99)

## 2018-06-28 LAB — PHOSPHORUS: Phosphorus: 3.1 mg/dL (ref 2.5–4.6)

## 2018-06-28 LAB — APTT
APTT: 66 s — AB (ref 24–36)
APTT: 65 s — AB (ref 24–36)
aPTT: 90 seconds — ABNORMAL HIGH (ref 24–36)

## 2018-06-28 LAB — MAGNESIUM: Magnesium: 2.4 mg/dL (ref 1.7–2.4)

## 2018-06-28 MED ORDER — LABETALOL HCL 5 MG/ML IV SOLN
10.0000 mg | Freq: Once | INTRAVENOUS | Status: AC
  Administered 2018-06-28: 10 mg via INTRAVENOUS

## 2018-06-28 MED ORDER — CEFAZOLIN SODIUM-DEXTROSE 1-4 GM/50ML-% IV SOLN
1.0000 g | Freq: Two times a day (BID) | INTRAVENOUS | Status: DC
  Administered 2018-06-28: 1 g via INTRAVENOUS
  Filled 2018-06-28 (×3): qty 50

## 2018-06-28 MED ORDER — FREE WATER
100.0000 mL | Status: DC
  Administered 2018-06-28 – 2018-06-29 (×6): 100 mL

## 2018-06-28 MED ORDER — LABETALOL HCL 5 MG/ML IV SOLN
INTRAVENOUS | Status: AC
  Filled 2018-06-28: qty 4

## 2018-06-28 NOTE — Progress Notes (Signed)
[facility] [place] at [facility]   PATIENT NAME: Destiny Mack    MR#:  [id number]  DATE OF BIRTH:  [birth year]  SUBJECTIVE:  CHIEF COMPLAINT:   Chief Complaint  Patient presents with  . Altered Mental Status  off sedation, on SBT REVIEW OF SYSTEMS:  Review of Systems  Unable to perform ROS: Intubated   DRUG ALLERGIES:  No Known Allergies VITALS:  Blood pressure 130/60, pulse 78, temperature 98.7 F (37.1 C), temperature source Axillary, resp. rate 17, height 5\' 4"  (1.626 m), weight 63.8 kg, SpO2 99 %. PHYSICAL EXAMINATION:  Physical Exam  HENT:  Head: Normocephalic and atraumatic.  Eyes: Pupils are equal, round, and reactive to light. Conjunctivae and EOM are normal.  Neck: Normal range of motion. Neck supple. No tracheal deviation present. No thyromegaly present.  Cardiovascular: Normal rate, regular rhythm and normal heart sounds.  Pulmonary/Chest: Effort normal and breath sounds normal. No respiratory distress. She has no wheezes. She exhibits no tenderness.  Abdominal: Soft. Bowel sounds are normal. She exhibits no distension. There is no tenderness.  Musculoskeletal: Normal range of motion.  Neurological: She is disoriented. No cranial nerve deficit.   on vent  Skin: Skin is warm and dry. No rash noted.  Psychiatric:   on vent   LABORATORY PANEL:  Female CBC Recent Labs  Lab 06/28/18 0334  WBC 10.5  HGB 8.7*  HCT 28.7*  PLT 171   ------------------------------------------------------------------------------------------------------------------ Chemistries  Recent Labs  Lab 06/24/18 2250  06/28/18 0334  NA 144   < > 145  K 5.0   < > 4.5  CL 116*   < > 116*  CO2 18*   < > 20*  GLUCOSE 120*   < > 99  BUN 36*   < > 36*  CREATININE 2.03*   < > 1.32*  CALCIUM 8.9   < > 8.9  MG  --    < > 2.4  AST 18  --   --   ALT 11  --   --   ALKPHOS 97  --   --   BILITOT 0.6  --   --    < > = values in this interval not displayed.   RADIOLOGY:    No results found. ASSESSMENT AND PLAN:  72 year old female with history of chronic atrial fibrillation and recent discharge from the hospital at the end of September with subacute CVA who presented to the ER due to unresponsiveness.  * Acute hypoxic respiratory failure: remains on vent - vent mgmt per intensivist - spontaneous BT today, off sedation  * Acute metabolic encephalopathy-Neg CT head, -Monitor neuro status  *  Sepsis: present on admission due to urinary tract infection Continue IV fluids, Ancef  * Atelectasis and LLL pneumonia: Ancef  * E Coli UTI -Cefazolin   * chronic atrial fibrillation with controlled ventricular rate.Was on Eliquis. TEE 12/2017 LVEF 55 to 60% normal systolic function -Rate control and on heparin for therapeutic anticoagulation while intubated  * Acute on CKD stage III - due to sepsis: - improving with hydration  * Recent CVA: Continue Eliquis and statin if possible once extubated       All the records are reviewed and case discussed with Care Management/Social Worker. Management plans discussed with the patient, nursing and they are in agreement.  CODE STATUS: Full Code  TOTAL TIME TAKING CARE OF THIS PATIENT: 15 minutes.   More than 50% of the time was spent in counseling/coordination of care:  YES  POSSIBLE D/C IN 3-4 DAYS, DEPENDING ON CLINICAL CONDITION.   Delfino Lovett M.D on 06/28/2018 at 11:49 PM  Between 7am to 6pm - Pager - [phone]  After 6pm go to [email or web address] - Social research officer, government  [facility]  Office  [phone]  CC: Primary care physician; Oswaldo Conroy, MD  Note: This dictation was prepared with Dragon dictation along with smaller phrase technology. Any transcriptional errors that result from this process are unintentional.

## 2018-06-28 NOTE — Clinical Social Work Note (Signed)
CSW received a consult stating "patient cannot return home, has bed bugs." Currently, patient is on a ventilator. [employer] Adult Protective Services has an open case on patient. Treatment plan is still in progress. Patient is not appropriate for PT to be consulted at this time. York Spaniel MSW,LCSW [phone]

## 2018-06-28 NOTE — Progress Notes (Addendum)
ANTICOAGULATION CONSULT NOTE  Pharmacy Consult for heparin Indication: atrial fibrillation  No Known Allergies  Patient Measurements: Height: 5\' 4"  (162.6 cm) Weight: 140 lb 10.5 oz (63.8 kg) IBW/kg (Calculated) : 54.7 Heparin Dosing Weight: 63.8 kg  Vital Signs: Temp: 100.1 F (37.8 C) (11/01 1200) Temp Source: Oral (11/01 1200) BP: 149/61 (11/01 1300) Pulse Rate: 99 (11/01 1300)  Labs: Recent Labs    06/26/18 0122 06/26/18 0709  06/26/18 1338  06/26/18 1920 06/27/18 0427 06/27/18 0603 06/27/18 1831 06/28/18 0334 06/28/18 1248  HGB 9.3*  --   --   --   --   --  7.0* 8.1* 8.4* 8.7*  --   HCT 29.8*  --   --   --   --   --  23.3* 27.1* 26.9* 28.7*  --   PLT 172  --   --   --   --   --  125*  --   --  171  --   APTT 76*  --    < >  --    < >  --   --  71*  --  66* 65*  HEPARINUNFRC  --   --   --   --   --   --  0.91*  --   --  0.85*  --   CREATININE 1.52*  --   --   --   --   --  1.23*  --   --  1.32*  --   TROPONINI 0.05* 0.03*  --  0.03*  --  0.03*  --   --   --   --   --    < > = values in this interval not displayed.    Estimated Creatinine Clearance: 33.3 mL/min (A) (by C-G formula based on SCr of 1.32 mg/dL (H)).   Medical History: Past Medical History:  Diagnosis Date  . A-fib (HCC)   . Depression   . Frequent falls   . Hypertension   . Leaking of urine   . Mixed incontinence   . Stroke (HCC)   . Tremor of hands and face     Medications:  Infusions:  . sodium chloride Stopped (06/27/18 0448)  .  ceFAZolin (ANCEF) IV    . heparin 650 Units/hr (06/28/18 1300)  . norepinephrine (LEVOPHED) Adult infusion 4 mcg/min (06/27/18 0444)  . propofol (DIPRIVAN) infusion Stopped (06/28/18 0747)    Assessment: Pharmacy consulted for heparin drip management for 72 yo female admitted to ICU on 10/28 for sepsis. Patient requiring mechanical ventilation and was transitioned to heparin drip from apixaban (PTA medication). Patient did not receive apixaban inpatient.  Patient with past medical history significant for chronic Afib, CKD 3, and recent discharge for subacute stroke. CHA2DS2-VASc Score of at least 6 (Age > 75, Gender, HTN, Stroke).    Goal of Therapy:  Heparin level 0.3-0.7 units/ml  APTT: 66-102 s Monitor platelets by anticoagulation protocol: Yes   Plan:  Increase heparin infusion from 650 units/hr to 700 units/hr based on subtherapeutic aPTT of 65 @ 1248. Will check aPTT/HL in 7 hours as patient is > 70 years.  Pharmacy will continue to monitor.   Mauri Reading, PharmD Pharmacy Resident  06/28/2018 2:14 PM

## 2018-06-28 NOTE — Progress Notes (Signed)
Pharmacy Antibiotic Note  Destiny Mack is a 72 y.o. female admitted on 06/24/2018 with sepsis. Patient was hospitalized in September with subacute stroke. Patient has history significant for a.fib and CKD stage III. Patient transferred to ICU from ED requiring mechanical ventilation, still currently requiring. Pharmacy has been consulted for cefepime dosing.  Plan: Decreased Cefazolin IV from 1 g q8h to 1 g q12h.     Height: 5\' 4"  (162.6 cm) Weight: 140 lb 10.5 oz (63.8 kg) IBW/kg (Calculated) : 54.7  Temp (24hrs), Avg:98.2 F (36.8 C), Min:97.1 F (36.2 C), Max:99.5 F (37.5 C)  Recent Labs  Lab 06/24/18 1327 06/24/18 1942 06/24/18 2250 06/25/18 0334 06/26/18 0122 06/27/18 0427 06/28/18 0334  WBC 14.6*  --  13.6* 13.7* 12.5* 7.9 10.5  CREATININE 2.03*  --  2.03* 2.09* 1.52* 1.23* 1.32*  LATICACIDVEN 1.4 1.4  --   --   --   --   --     Estimated Creatinine Clearance: 33.3 mL/min (A) (by C-G formula based on SCr of 1.32 mg/dL (H)).    No Known Allergies  Antimicrobials this admission: Zosyn 10/28 x 1 Vancomycin 10/28 >> 10/29 Cefepime 10/28 >> 10/31  Dose adjustments this admission: 11/1 Decreased Cefazolin IV from 1 g q8h to 1 g q12h.    Microbiology results: 10/28 BCx: no growth x 4 days  10/28 UCx: 100,000 CFU E.coli resistant to ampicillin, ciprofloxacin, and unasyn 10/28 MRSA PCR: negative   Thank you for allowing pharmacy to be a part of this patient's care.   Mauri Reading, PharmD Pharmacy Resident  06/28/2018 11:33 AM

## 2018-06-28 NOTE — Progress Notes (Addendum)
Pharmacy Electrolyte Monitoring Consult:  Pharmacy consulted to assist in monitoring and replacing electrolytes in this 72 y.o. female admitted on 06/24/2018 with sepsis. Patient is currently intubated.  Labs:  Sodium (mmol/L)  Date Value  06/28/2018 145   Potassium (mmol/L)  Date Value  06/28/2018 4.5   Magnesium (mg/dL)  Date Value  96/11/5407 2.4   Phosphorus (mg/dL)  Date Value  81/19/1478 3.1   Calcium (mg/dL)  Date Value  29/56/2130 8.9   Albumin (g/dL)  Date Value  86/57/8469 3.6     Assessment/Plan: Per ICU rounds will replace for goal potassium ~ 4, goal magnesium ~2, and goal corrected calcium > 8.5.   Electrolyte replacement not warranted.  Will obtain follow up electrolytes with am labs.   Pharmacy will continue to monitor and adjust per consult.    Mauri Reading, PharmD Pharmacy Resident  06/28/2018 11:38 AM

## 2018-06-28 NOTE — Progress Notes (Signed)
ANTICOAGULATION CONSULT NOTE  Pharmacy Consult for heparin Indication: atrial fibrillation  No Known Allergies  Patient Measurements: Height: 5\' 4"  (162.6 cm) Weight: 140 lb 10.5 oz (63.8 kg) IBW/kg (Calculated) : 54.7 Heparin Dosing Weight: 61.9 kg  Vital Signs: Temp: 97.8 F (36.6 C) (11/01 0400) Temp Source: Axillary (11/01 0400) BP: 133/59 (11/01 0500) Pulse Rate: 98 (11/01 0500)  Labs: Recent Labs    06/26/18 0122 06/26/18 0709  06/26/18 1338 06/26/18 1500 06/26/18 1920 06/27/18 0427 06/27/18 0603 06/27/18 1831 06/28/18 0334  HGB 9.3*  --   --   --   --   --  7.0* 8.1* 8.4* 8.7*  HCT 29.8*  --   --   --   --   --  23.3* 27.1* 26.9* 28.7*  PLT 172  --   --   --   --   --  125*  --   --  171  APTT 76*  --    < >  --  79*  --   --  71*  --  66*  HEPARINUNFRC  --   --   --   --   --   --  0.91*  --   --  0.85*  CREATININE 1.52*  --   --   --   --   --  1.23*  --   --  1.32*  TROPONINI 0.05* 0.03*  --  0.03*  --  0.03*  --   --   --   --    < > = values in this interval not displayed.    Estimated Creatinine Clearance: 33.3 mL/min (A) (by C-G formula based on SCr of 1.32 mg/dL (H)).   Medical History: Past Medical History:  Diagnosis Date  . A-fib (HCC)   . Depression   . Frequent falls   . Hypertension   . Leaking of urine   . Mixed incontinence   . Stroke (HCC)   . Tremor of hands and face     Medications:  Infusions:  . sodium chloride Stopped (06/27/18 0448)  .  ceFAZolin (ANCEF) IV 1 g (06/28/18 0522)  . heparin 550 Units/hr (06/28/18 0328)  . norepinephrine (LEVOPHED) Adult infusion 4 mcg/min (06/27/18 0444)  . propofol (DIPRIVAN) infusion 10 mcg/kg/min (06/28/18 0300)    Assessment: Pharmacy consulted for heparin drip management for 72 yo female admitted to ICU on 10/28 for sepsis. Patient requiring mechanical ventilation and was transitioned to heparin drip from apixaban. Patient with past medical history significant for chronic Afib, CKD 3,  and recent discharge for subacute stroke. CHA2DS2-VASc Score of at least 6 (Age > 75, Gender, HTN, Stroke).   Patient with Heparin is currently infusing at 750 units/hr.   Goal of Therapy:  Heparin level 0.3-0.7 units/ml  APTT: 68-109  Monitor platelets by anticoagulation protocol: Yes   Plan:  Continue heparin @ 550 units/hr. Past 3 APTTs have been within range. Will obtain next aPTT with AM labs.  Will also obtain anti-Xa level with am labs. Will obtain anti-Xa levels daily until aPTT and anti-Xa levels correlate.   Pharmacy will continue to monitor and adjust per consult.   10/31 AM aPTT 71, heparin level 0.91. Continue current regimen. Recheck aPTT, heparin level, and CBC with tomorrow AM labs.  10/31 AM aPTT 66, heparin level 0.85. Increase rate to 650 units/hr and recheck aPTT in 6 hours.  Fulton Reek, PharmD, BCPS  06/28/18 6:38 AM

## 2018-06-28 NOTE — Progress Notes (Signed)
   Name: Destiny Mack MRN: [id number] DOB: [birth year]     CONSULTATION DATE: 06/24/2018  Subjective & objectives: Heparin drip for A. fib and norepinephrine.  PAST MEDICAL HISTORY :   has a past medical history of A-fib (HCC), Depression, Frequent falls, Hypertension, Leaking of urine, Mixed incontinence, Stroke (HCC), and Tremor of hands and face.  has a past surgical history that includes Tubal ligation; Appendectomy; and TEE without cardioversion (N/A, 01/04/2018). Prior to Admission medications   Medication Sig Start Date End Date Taking? Authorizing Provider  amLODipine (NORVASC) 5 MG tablet Take 5 mg by mouth daily.   Yes [provider]  apixaban (ELIQUIS) 5 MG TABS tablet Take 1 tablet (5 mg total) by mouth 2 (two) times daily. 12/15/17 06/24/18 Yes Pyreddy, Vivien Rota, MD  atorvastatin (LIPITOR) 20 MG tablet Take 20 mg by mouth at bedtime.    Yes [provider]   No Known Allergies  FAMILY HISTORY:  family history includes CAD in her father; Dementia in her mother; Heart failure in her father and mother. SOCIAL HISTORY:  reports that she has been smoking. She has a 28.00 pack-year smoking history. She has never used smokeless tobacco. She reports that she does not drink alcohol or use drugs.  REVIEW OF SYSTEMS:   Unable to obtain due to critical illness   VITAL SIGNS: Temp:  [97.7 F (36.5 C)-99.5 F (37.5 C)] 99.5 F (37.5 C) (11/01 0800) Pulse Rate:  [85-138] 98 (11/01 0900) Resp:  [17-45] 23 (11/01 0900) BP: (114-162)/(47-64) 137/63 (11/01 0951) SpO2:  [97 %-100 %] 100 % (11/01 0900) FiO2 (%):  [30 %] 30 % (11/01 1111) Weight:  [63.8 kg] 63.8 kg (11/01 0329)  Physical Examination: Off sedation, awake, tracks and not following commands. On vent, no distress, bilateral equal air entry with no adventitious sounds S1 & S2 are audible with no murmur Benign abdominal exam with feeble peristalsis Wasted extremities and no edema  ASSESSMENT /  PLAN: Acute respiratory failure intubated for airway protection with altered mental status.  tachycardia and hypertension on SBT andcontinues to have poor weaning parameters because of altered mental status  -Monitor ABG, optimize ventilator settings and continuous vent support  Altered mental status with toxic metabolic encephalopathy/hypertensive emergency.No acute intracranial abnormalities on the CT head,bilateral frontal lobe infarct left larger than the right stable multifocal paranasal sinus disease -Monitor neuro status  Hypertensive emergency with altered mental status -Optimize antihypertensives and monitor hemodynamics  Atelectasis and pneumonia.ImprovedLeft lower airspace disease -c/wCefepime + offvancomycin. MRSA PCR -ve -Monitor CXR + CBC + FiO2 requirement. Follow with MRSA PCR and cultures  UTI. E Coli.  -Cefazolin + d/cCefepime +d/cvancomycin. Monitor renal culture  Sepsisis less likely was procalcitonin level less than 0.1. -Empiric antimicrobials, monitor procalcitonin and consider de-escalation of antimicrobial's final culture results.  chronic atrial fibrillation with controlled ventricular rate.Was on Eliquis. TEE 12/2017 LVEF 55 to 60% normal systolic function -Rate control and start on heparin for therapeutic anticoagulation while intubated  AKIon CKD stage III with intravascular volume depletion -Optimize hydration, avoid nephrotoxins, monitor renal panel and urine output.  Anemia -Keep hemoglobin more than 7 g/dL  Full code  DVT &GI prophylaxis. Continue with supportive care. Family was updated at the bedside and they agreed to the plan of management.  Critical care time51minutes

## 2018-06-29 ENCOUNTER — Inpatient Hospital Stay: Payer: Medicare HMO

## 2018-06-29 LAB — BASIC METABOLIC PANEL
Anion gap: 6 (ref 5–15)
BUN: 36 mg/dL — ABNORMAL HIGH (ref 8–23)
CALCIUM: 8.6 mg/dL — AB (ref 8.9–10.3)
CO2: 24 mmol/L (ref 22–32)
CREATININE: 1.19 mg/dL — AB (ref 0.44–1.00)
Chloride: 112 mmol/L — ABNORMAL HIGH (ref 98–111)
GFR, EST AFRICAN AMERICAN: 52 mL/min — AB (ref >60)
GFR, EST NON AFRICAN AMERICAN: 44 mL/min — AB (ref >60)
Glucose, Bld: 112 mg/dL — ABNORMAL HIGH (ref 70–99)
Potassium: 4.2 mmol/L (ref 3.5–5.1)
SODIUM: 142 mmol/L (ref 135–145)

## 2018-06-29 LAB — CULTURE, BLOOD (ROUTINE X 2)
Culture: NO GROWTH
Culture: NO GROWTH
Special Requests: ADEQUATE

## 2018-06-29 LAB — CBC WITH DIFFERENTIAL/PLATELET
Abs Immature Granulocytes: 0.04 10*3/uL (ref 0.00–0.07)
BASOS ABS: 0.1 10*3/uL (ref 0.0–0.1)
BASOS PCT: 1 %
EOS ABS: 0.4 10*3/uL (ref 0.0–0.5)
EOS PCT: 4 %
HEMATOCRIT: 27.3 % — AB (ref 36.0–46.0)
Hemoglobin: 8.4 g/dL — ABNORMAL LOW (ref 12.0–15.0)
Immature Granulocytes: 0 %
Lymphocytes Relative: 13 %
Lymphs Abs: 1.3 10*3/uL (ref 0.7–4.0)
MCH: 30.5 pg (ref 26.0–34.0)
MCHC: 30.8 g/dL (ref 30.0–36.0)
MCV: 99.3 fL (ref 80.0–100.0)
Monocytes Absolute: 0.9 10*3/uL (ref 0.1–1.0)
Monocytes Relative: 10 %
NRBC: 0 % (ref 0.0–0.2)
Neutro Abs: 6.9 10*3/uL (ref 1.7–7.7)
Neutrophils Relative %: 72 %
Platelets: 161 10*3/uL (ref 150–400)
RBC: 2.75 MIL/uL — ABNORMAL LOW (ref 3.87–5.11)
RDW: 15.3 % (ref 11.5–15.5)
WBC: 9.6 10*3/uL (ref 4.0–10.5)

## 2018-06-29 LAB — GLUCOSE, CAPILLARY
GLUCOSE-CAPILLARY: 115 mg/dL — AB (ref 70–99)
Glucose-Capillary: 80 mg/dL (ref 70–99)
Glucose-Capillary: 91 mg/dL (ref 70–99)
Glucose-Capillary: 95 mg/dL (ref 70–99)
Glucose-Capillary: 99 mg/dL (ref 70–99)

## 2018-06-29 LAB — HEPARIN LEVEL (UNFRACTIONATED): Heparin Unfractionated: 0.61 IU/mL (ref 0.30–0.70)

## 2018-06-29 LAB — PHOSPHORUS: Phosphorus: 3 mg/dL (ref 2.5–4.6)

## 2018-06-29 LAB — MAGNESIUM: Magnesium: 2.3 mg/dL (ref 1.7–2.4)

## 2018-06-29 LAB — APTT: aPTT: 83 seconds — ABNORMAL HIGH (ref 24–36)

## 2018-06-29 MED ORDER — AMLODIPINE BESYLATE 10 MG PO TABS: 10.0000 mg | ORAL_TABLET | Freq: Every day | ORAL | Status: DC

## 2018-06-29 MED ORDER — BUDESONIDE 0.25 MG/2ML IN SUSP
0.2500 mg | Freq: Two times a day (BID) | RESPIRATORY_TRACT | Status: DC
  Administered 2018-06-29 – 2018-07-03 (×7): 0.25 mg via RESPIRATORY_TRACT
  Filled 2018-06-29 (×8): qty 2

## 2018-06-29 MED ORDER — AMLODIPINE BESYLATE 10 MG PO TABS
10.0000 mg | ORAL_TABLET | Freq: Every day | ORAL | Status: DC
  Administered 2018-06-29 – 2018-07-03 (×2): 10 mg
  Filled 2018-06-29 (×3): qty 1

## 2018-06-29 MED ORDER — CEFAZOLIN SODIUM-DEXTROSE 1-4 GM/50ML-% IV SOLN
1.0000 g | Freq: Three times a day (TID) | INTRAVENOUS | Status: DC
  Administered 2018-06-29 – 2018-07-01 (×6): 1 g via INTRAVENOUS
  Filled 2018-06-29 (×8): qty 50

## 2018-06-29 MED ORDER — ORAL CARE MOUTH RINSE
15.0000 mL | Freq: Two times a day (BID) | OROMUCOSAL | Status: DC
  Administered 2018-06-29 – 2018-07-03 (×6): 15 mL via OROMUCOSAL

## 2018-06-29 MED ORDER — LACTATED RINGERS IV SOLN
INTRAVENOUS | Status: DC
  Administered 2018-06-29 – 2018-07-03 (×6): via INTRAVENOUS

## 2018-06-29 MED ORDER — LABETALOL HCL 5 MG/ML IV SOLN
10.0000 mg | Freq: Four times a day (QID) | INTRAVENOUS | Status: DC | PRN
  Administered 2018-06-29 – 2018-07-01 (×2): 10 mg via INTRAVENOUS
  Filled 2018-06-29 (×2): qty 4

## 2018-06-29 MED ORDER — LABETALOL HCL 5 MG/ML IV SOLN: 10.0000 mg | Freq: Four times a day (QID) | INTRAVENOUS | Status: DC | PRN

## 2018-06-29 NOTE — Progress Notes (Signed)
ANTICOAGULATION CONSULT NOTE  Pharmacy Consult for heparin Indication: atrial fibrillation  No Known Allergies  Patient Measurements: Height: 5\' 4"  (162.6 cm) Weight: 140 lb 10.5 oz (63.8 kg) IBW/kg (Calculated) : 54.7 Heparin Dosing Weight: 63.8 kg  Vital Signs: Temp: 97.8 F (36.6 C) (11/02 0000) Temp Source: Axillary (11/02 0000) BP: 128/60 (11/02 0500) Pulse Rate: 76 (11/02 0500)  Labs: Recent Labs    06/26/18 0709  06/26/18 1338  06/26/18 1920  06/27/18 0427  06/27/18 1831 06/28/18 0334 06/28/18 1248 06/28/18 2257 06/29/18 0455  HGB  --   --   --   --   --   --  7.0*   < > 8.4* 8.7*  --   --  8.4*  HCT  --   --   --   --   --   --  23.3*   < > 26.9* 28.7*  --   --  27.3*  PLT  --   --   --   --   --   --  125*  --   --  171  --   --  161  APTT  --    < >  --    < >  --   --   --    < >  --  66* 65* 90* 83*  HEPARINUNFRC  --   --   --   --   --    < > 0.91*  --   --  0.85*  --  0.72* 0.61  CREATININE  --   --   --   --   --   --  1.23*  --   --  1.32*  --   --  1.19*  TROPONINI 0.03*  --  0.03*  --  0.03*  --   --   --   --   --   --   --   --    < > = values in this interval not displayed.    Estimated Creatinine Clearance: 36.9 mL/min (A) (by C-G formula based on SCr of 1.19 mg/dL (H)).   Medical History: Past Medical History:  Diagnosis Date  . A-fib (HCC)   . Depression   . Frequent falls   . Hypertension   . Leaking of urine   . Mixed incontinence   . Stroke (HCC)   . Tremor of hands and face     Medications:  Infusions:  . sodium chloride Stopped (06/27/18 0448)  .  ceFAZolin (ANCEF) IV Stopped (06/28/18 1728)  . heparin 700 Units/hr (06/29/18 0500)  . norepinephrine (LEVOPHED) Adult infusion 4 mcg/min (06/27/18 0444)  . propofol (DIPRIVAN) infusion Stopped (06/28/18 0747)    Assessment: Pharmacy consulted for heparin drip management for 72 yo female admitted to ICU on 10/28 for sepsis. Patient requiring mechanical ventilation and was  transitioned to heparin drip from apixaban (PTA medication). Patient did not receive apixaban inpatient. Patient with past medical history significant for chronic Afib, CKD 3, and recent discharge for subacute stroke. CHA2DS2-VASc Score of at least 6 (Age > 75, Gender, HTN, Stroke).    Goal of Therapy:  Heparin level 0.3-0.7 units/ml  APTT: 66-102 s Monitor platelets by anticoagulation protocol: Yes   Plan:  Increase heparin infusion from 650 units/hr to 700 units/hr based on subtherapeutic aPTT of 65 @ 1248. Will check aPTT/HL in 7 hours as patient is > 70 years.  11/1 PM heparin level 0.72, aPTT 90. Continue current regimen  and recheck with AM labs.  11/2 AM heparin level 0.61, aPTT 83. Now correlating in therapeutic range. Will monitor and adjust by heparin level only. Recheck heparin level and CBC with tomorrow AM labs.  Pharmacy will continue to monitor.  Fulton Reek, PharmD, BCPS  06/29/18 5:37 AM

## 2018-06-29 NOTE — Progress Notes (Signed)
ANTICOAGULATION CONSULT NOTE  Pharmacy Consult for heparin Indication: atrial fibrillation  No Known Allergies  Patient Measurements: Height: 5\' 4"  (162.6 cm) Weight: 140 lb 10.5 oz (63.8 kg) IBW/kg (Calculated) : 54.7 Heparin Dosing Weight: 63.8 kg  Vital Signs: Temp: 98.7 F (37.1 C) (11/01 2000) Temp Source: Axillary (11/01 2000) BP: 132/66 (11/02 0000) Pulse Rate: 81 (11/02 0000)  Labs: Recent Labs    06/26/18 0122 06/26/18 0709  06/26/18 1338  06/26/18 1920 06/27/18 0427 06/27/18 0603 06/27/18 1831 06/28/18 0334 06/28/18 1248 06/28/18 2257  HGB 9.3*  --   --   --   --   --  7.0* 8.1* 8.4* 8.7*  --   --   HCT 29.8*  --   --   --   --   --  23.3* 27.1* 26.9* 28.7*  --   --   PLT 172  --   --   --   --   --  125*  --   --  171  --   --   APTT 76*  --    < >  --    < >  --   --  71*  --  66* 65* 90*  HEPARINUNFRC  --   --   --   --   --   --  0.91*  --   --  0.85*  --  0.72*  CREATININE 1.52*  --   --   --   --   --  1.23*  --   --  1.32*  --   --   TROPONINI 0.05* 0.03*  --  0.03*  --  0.03*  --   --   --   --   --   --    < > = values in this interval not displayed.    Estimated Creatinine Clearance: 33.3 mL/min (A) (by C-G formula based on SCr of 1.32 mg/dL (H)).   Medical History: Past Medical History:  Diagnosis Date  . A-fib (HCC)   . Depression   . Frequent falls   . Hypertension   . Leaking of urine   . Mixed incontinence   . Stroke (HCC)   . Tremor of hands and face     Medications:  Infusions:  . sodium chloride Stopped (06/27/18 0448)  .  ceFAZolin (ANCEF) IV Stopped (06/28/18 1728)  . heparin 700 Units/hr (06/28/18 2100)  . norepinephrine (LEVOPHED) Adult infusion 4 mcg/min (06/27/18 0444)  . propofol (DIPRIVAN) infusion Stopped (06/28/18 0747)    Assessment: Pharmacy consulted for heparin drip management for 72 yo female admitted to ICU on 10/28 for sepsis. Patient requiring mechanical ventilation and was transitioned to heparin drip  from apixaban (PTA medication). Patient did not receive apixaban inpatient. Patient with past medical history significant for chronic Afib, CKD 3, and recent discharge for subacute stroke. CHA2DS2-VASc Score of at least 6 (Age > 75, Gender, HTN, Stroke).    Goal of Therapy:  Heparin level 0.3-0.7 units/ml  APTT: 66-102 s Monitor platelets by anticoagulation protocol: Yes   Plan:  Increase heparin infusion from 650 units/hr to 700 units/hr based on subtherapeutic aPTT of 65 @ 1248. Will check aPTT/HL in 7 hours as patient is > 70 years.  11/1 PM heparin level 0.72, aPTT 90. Continue current regimen and recheck with AM labs.  Pharmacy will continue to monitor.  Fulton Reek, PharmD, BCPS  06/29/18 12:24 AM

## 2018-06-29 NOTE — Progress Notes (Signed)
Pharmacy Antibiotic Note  Destiny Mack is a 72 y.o. female admitted on 06/24/2018 with sepsis. Patient was hospitalized in September with subacute stroke. Patient has history significant for a.fib and CKD stage III. Patient transferred to ICU from ED requiring mechanical ventilation, still currently requiring. Pharmacy has been consulted for cefazolin dosing.  Plan: Will adjust cefazolin dose to Cefazolin IV from 1 g q8h based on CrCl > 37mL/min    Height: 5\' 4"  (162.6 cm) Weight: 143 lb 8.3 oz (65.1 kg) IBW/kg (Calculated) : 54.7  Temp (24hrs), Avg:98.9 F (37.2 C), Min:97.8 F (36.6 C), Max:100.1 F (37.8 C)  Recent Labs  Lab 06/24/18 1327 06/24/18 1942  06/25/18 0334 06/26/18 0122 06/27/18 0427 06/28/18 0334 06/29/18 0455  WBC 14.6*  --    < > 13.7* 12.5* 7.9 10.5 9.6  CREATININE 2.03*  --    < > 2.09* 1.52* 1.23* 1.32* 1.19*  LATICACIDVEN 1.4 1.4  --   --   --   --   --   --    < > = values in this interval not displayed.    Estimated Creatinine Clearance: 36.9 mL/min (A) (by C-G formula based on SCr of 1.19 mg/dL (H)).    No Known Allergies  Antimicrobials this admission: Zosyn 10/28 x 1 Vancomycin 10/28 >> 10/29 Cefepime 10/28 >> 10/31 Cefazolin 10/31>>   Dose adjustments this admission: 11/1 Decreased Cefazolin IV from 1 g q8h to 1 g q12h. 11/2 increased cefazolin to 1g IV every 8 hours     Microbiology results: 10/28 BCx: no growth x 4 days  10/28 UCx: 100,000 CFU E.coli resistant to ampicillin, ciprofloxacin, and unasyn 10/28 MRSA PCR: negative   Thank you for allowing pharmacy to be a part of this patient's care.  Gardner Candle, PharmD, BCPS Clinical Pharmacist 06/29/2018 8:21 AM

## 2018-06-29 NOTE — Progress Notes (Signed)
Patient gets restless easily and tries to bite ventilator. Had to be medicated with fentanyl 50 mcg IV push x 4 this shift. FBS in 90s to low 100.No other concerns this shift.

## 2018-06-29 NOTE — Progress Notes (Signed)
Pharmacy Electrolyte Monitoring Consult:  Pharmacy consulted to assist in monitoring and replacing electrolytes in this 72 y.o. female admitted on 06/24/2018 with sepsis. Patient is currently intubated.  Labs:  Sodium (mmol/L)  Date Value  06/29/2018 142   Potassium (mmol/L)  Date Value  06/29/2018 4.2   Magnesium (mg/dL)  Date Value  08/65/7846 2.3   Phosphorus (mg/dL)  Date Value  96/29/5284 3.0   Calcium (mg/dL)  Date Value  13/24/4010 8.6 (L)   Albumin (g/dL)  Date Value  27/25/3664 3.6     Assessment/Plan: Per ICU rounds will replace for goal potassium ~ 4, goal magnesium ~2, and goal corrected calcium > 8.5.   Electrolyte replacement not warranted.  Will obtain follow up electrolytes with am labs.   Pharmacy will continue to monitor and adjust per consult.   Gardner Candle, PharmD, BCPS Clinical Pharmacist 06/29/2018 8:13 AM

## 2018-06-29 NOTE — Progress Notes (Signed)
Pt. Extubated and placed on 3l [place],hr 87,rr 20,sat 97. No apparent distress noted at this time.

## 2018-06-29 NOTE — Progress Notes (Signed)
   Name: Destiny Mack MRN: [id number] DOB: 02-Apr-1946     CONSULTATION DATE: 06/24/2018  Subjective & objectives: Afebrile, heparin drip, off norepinephrine.  PAST MEDICAL HISTORY :   has a past medical history of A-fib (HCC), Depression, Frequent falls, Hypertension, Leaking of urine, Mixed incontinence, Stroke (HCC), and Tremor of hands and face.  has a past surgical history that includes Tubal ligation; Appendectomy; and TEE without cardioversion (N/A, 01/04/2018). Prior to Admission medications   Medication Sig Start Date End Date Taking? Authorizing Provider  amLODipine (NORVASC) 5 MG tablet Take 5 mg by mouth daily.   Yes [provider]  apixaban (ELIQUIS) 5 MG TABS tablet Take 1 tablet (5 mg total) by mouth 2 (two) times daily. 12/15/17 06/24/18 Yes Pyreddy, Vivien Rota, MD  atorvastatin (LIPITOR) 20 MG tablet Take 20 mg by mouth at bedtime.    Yes [provider]   No Known Allergies  FAMILY HISTORY:  family history includes CAD in her father; Dementia in her mother; Heart failure in her father and mother. SOCIAL HISTORY:  reports that she has been smoking. She has a 28.00 pack-year smoking history. She has never used smokeless tobacco. She reports that she does not drink alcohol or use drugs.  REVIEW OF SYSTEMS:   Unable to obtain due to critical illness   VITAL SIGNS: Temp:  [97.8 F (36.6 C)-100.1 F (37.8 C)] 99.7 F (37.6 C) (11/02 0900) Pulse Rate:  [68-108] 104 (11/02 0900) Resp:  [11-46] 24 (11/02 0900) BP: (114-154)/(53-66) 142/65 (11/02 0900) SpO2:  [98 %-100 %] 98 % (11/02 1000) FiO2 (%):  [28 %-30 %] 28 % (11/02 1000) Weight:  [65.1 kg] 65.1 kg (11/02 0500)  Physical Examination: Off sedation, awake, tracks, moving all extremities and following commands as per family. On vent, no distress, bilateral equal air entry with no adventitious sounds S1 & S2 are audible with no murmur Benign abdominal exam with feeble peristalsis Wasted  extremities and no edema  ASSESSMENT / PLAN: Acute respiratory failure intubated for airway protection with altered mental status.Tolerating CPAP pressure support. -Weaning parameters -Monitor ABG, optimize ventilator settings and continuous vent support  Altered mental status with toxic metabolic encephalopathy/hypertensive emergency.No acute intracranial abnormalities on the CT head,bilateral frontal lobe infarct left larger than the right stable multifocal paranasal sinus disease -Monitor neuro status  Hypertensive emergency with altered mental status -Optimize antihypertensives and monitor hemodynamics  Atelectasis and pneumonia.ImprovedLeft lower airspace disease -c/wCefepime +offvancomycin. MRSA PCR -ve -Monitor CXR + CBC + FiO2 requirement. Follow with MRSA PCR and cultures  UTI. E Coli.  -Cefazolin + d/cCefepime +d/cvancomycin. Monitor renal culture  Sepsisis less likely was procalcitonin level less than 0.1. -Empiric antimicrobials, monitor procalcitonin and consider de-escalation of antimicrobial's final culture results.  chronic atrial fibrillation with controlled ventricular rate.Was on Eliquis. TEE 12/2017 LVEF 55 to 60% normal systolic function -Rate control and start on heparin for therapeutic anticoagulation while intubated  AKI(improved)on CKD stage III with intravascular volume depletion -Optimize hydration, avoid nephrotoxins, monitor renal panel and urine output.  Anemia -Keep hemoglobin more than 7 g/dL  Full code  DVT &GI prophylaxis. Continue with supportive care. Family was updated at the bedside and they agreed to the plan of management.  Critical care time13minutes

## 2018-06-29 NOTE — Progress Notes (Signed)
[place] at [facility]                                                                                                                                                                                  Patient Demographics   Destiny Mack, is a 72 y.o. female, DOB - 1945-12-18, VXB:[id number]  Admit date - 06/24/2018   Admitting Physician Bettey Costa, MD  Outpatient Primary MD for the patient is Bender, Durene Cal, MD   LOS - 5  Subjective: Patient is on the vent weaning trials are being attempted     Review of Systems:   CONSTITUTIONAL unable to provide due to patient being intubated   Vitals:   Vitals:   06/29/18 1200 06/29/18 1300 06/29/18 1330 06/29/18 1421  BP: 140/63 (!) 146/60 104/66   Pulse: 77 81 89   Resp: (!) 25 (!) 23 16   Temp: 100.2 F (37.9 C) 100.2 F (37.9 C) 100 F (37.8 C)   TempSrc:      SpO2: 98% 99% 98% 98%  Weight:      Height:        Wt Readings from Last 3 Encounters:  06/29/18 65.1 kg  05/20/18 59.2 kg  01/09/18 77.1 kg     Intake/Output Summary (Last 24 hours) at 06/29/2018 1431 Last data filed at 06/29/2018 1000 Gross per 24 hour  Intake 1598.59 ml  Output 1295 ml  Net 303.59 ml    Physical Exam:   GENERAL: Critically ill on the vent HEAD, EYES, EARS, NOSE AND THROAT: Atraumatic, normocephalic. Extraocular muscles are intact. Pupils equal and reactive to light. Sclerae anicteric. No conjunctival injection. No oro-pharyngeal erythema.  NECK: Supple. There is no jugular venous distention. No bruits, no lymphadenopathy, no thyromegaly.  HEART: Regular rate and rhythm,. No murmurs, no rubs, no clicks.  LUNGS: Clear to auscultation bilaterally. No rales or rhonchi. No wheezes.  ABDOMEN: Soft, flat, nontender, nondistended. Has good bowel sounds. No hepatosplenomegaly appreciated.  EXTREMITIES: No evidence of any cyanosis, clubbing, or peripheral edema.  +2 pedal and radial pulses bilaterally.  NEUROLOGIC:  Sedated on the vent SKIN: Moist and warm with no rashes appreciated.  Psych: Not anxious, depressed LN: No inguinal LN enlargement    Antibiotics   Anti-infectives (From admission, onward)   Start     Dose/Rate Route Frequency Ordered Stop   06/29/18 1400  ceFAZolin (ANCEF) IVPB 1 g/50 mL premix     1 g 100 mL/hr over 30 Minutes Intravenous Every 8 hours 06/29/18 0819     06/28/18 1700  ceFAZolin (ANCEF) IVPB 1 g/50 mL premix  Status:  Discontinued     1 g 100  mL/hr over 30 Minutes Intravenous Every 12 hours 06/28/18 1135 06/29/18 0819   06/27/18 2200  ceFAZolin (ANCEF) IVPB 1 g/50 mL premix  Status:  Discontinued     1 g 100 mL/hr over 30 Minutes Intravenous Every 8 hours 06/27/18 1642 06/28/18 1135   06/25/18 1200  vancomycin (VANCOCIN) IVPB 1000 mg/200 mL premix  Status:  Discontinued     1,000 mg 200 mL/hr over 60 Minutes Intravenous Every 36 hours 06/24/18 1535 06/25/18 1551   06/24/18 2200  piperacillin-tazobactam (ZOSYN) IVPB 3.375 g  Status:  Discontinued     3.375 g 12.5 mL/hr over 240 Minutes Intravenous Every 8 hours 06/24/18 1535 06/24/18 1635   06/24/18 2200  ceFEPIme (MAXIPIME) 1 g in sodium chloride 0.9 % 100 mL IVPB  Status:  Discontinued     1 g 200 mL/hr over 30 Minutes Intravenous Every 24 hours 06/24/18 1637 06/27/18 1642   06/24/18 1535  vancomycin variable dose per unstable renal function (pharmacist dosing)  Status:  Discontinued      Does not apply See admin instructions 06/24/18 1535 06/25/18 1039   06/24/18 1445  vancomycin (VANCOCIN) IVPB 1000 mg/200 mL premix     1,000 mg 200 mL/hr over 60 Minutes Intravenous  Once 06/24/18 1441 06/24/18 1653   06/24/18 1445  piperacillin-tazobactam (ZOSYN) IVPB 3.375 g     3.375 g 100 mL/hr over 30 Minutes Intravenous  Once 06/24/18 1441 06/24/18 1545      Medications   Scheduled Meds: . sodium chloride   Intravenous Once  . amLODipine  10 mg Per Tube Daily  . atorvastatin  20 mg Per Tube QHS  . chlorhexidine  gluconate (MEDLINE KIT)  15 mL Mouth Rinse BID  . famotidine  20 mg Per Tube QHS  . feeding supplement (PRO-STAT SUGAR FREE 64)  30 mL Per Tube Daily  . feeding supplement (VITAL AF 1.2 CAL)  1,000 mL Per Tube Q24H  . free water  100 mL Per Tube Q4H  . ipratropium-albuterol  3 mL Nebulization Q6H  . mouth rinse  15 mL Mouth Rinse 10 times per day  . multivitamin  15 mL Per Tube Daily  . sodium chloride flush  10-40 mL Intracatheter Q12H   Continuous Infusions: . sodium chloride Stopped (06/27/18 0448)  .  ceFAZolin (ANCEF) IV    . heparin 700 Units/hr (06/29/18 1000)  . norepinephrine (LEVOPHED) Adult infusion 4 mcg/min (06/27/18 0444)  . propofol (DIPRIVAN) infusion Stopped (06/28/18 0747)   PRN Meds:.sodium chloride, acetaminophen **OR** acetaminophen, fentaNYL (SUBLIMAZE) injection, labetalol, senna-docusate, sodium chloride flush   Data Review:   Micro Results Recent Results (from the past 240 hour(s))  Culture, blood (Routine x 2)     Status: None   Collection Time: 06/24/18  1:20 PM  Result Value Ref Range Status   Specimen Description BLOOD BLOOD LEFT ARM  Final   Special Requests   Final    BOTTLES DRAWN AEROBIC AND ANAEROBIC Blood Culture adequate volume   Culture   Final    NO GROWTH 5 DAYS Performed at [facility], [address], [place], [place] [address]    Report Status 06/29/2018 FINAL  Final  Culture, blood (Routine x 2)     Status: None   Collection Time: 06/24/18  1:20 PM  Result Value Ref Range Status   Specimen Description BLOOD BLOOD RIGHT HAND  Final   Special Requests   Final    BOTTLES DRAWN AEROBIC AND ANAEROBIC Blood Culture results may not  be optimal due to an excessive volume of blood received in culture bottles   Culture   Final    NO GROWTH 5 DAYS Performed at [facility], [address] [place], [place] [address]    Report Status 06/29/2018 FINAL  Final  Urine Culture     Status: Abnormal   Collection Time: 06/24/18   1:54 PM  Result Value Ref Range Status   Specimen Description   Final    URINE, RANDOM Performed at [facility], [address] [place], [place] [address]    Special Requests   Final    Normal Performed at [facility], [address] [place], [place] [address]    Culture >=100,000 COLONIES/mL ESCHERICHIA COLI (A)  Final   Report Status 06/27/2018 FINAL  Final   Organism ID, Bacteria ESCHERICHIA COLI (A)  Final      Susceptibility   Escherichia coli - MIC*    AMPICILLIN >=32 RESISTANT Resistant     CEFAZOLIN <=4 SENSITIVE Sensitive     CEFTRIAXONE <=1 SENSITIVE Sensitive     CIPROFLOXACIN >=4 RESISTANT Resistant     GENTAMICIN <=1 SENSITIVE Sensitive     IMIPENEM <=0.25 SENSITIVE Sensitive     NITROFURANTOIN <=16 SENSITIVE Sensitive     TRIMETH/SULFA <=20 SENSITIVE Sensitive     AMPICILLIN/SULBACTAM 16 INTERMEDIATE Intermediate     PIP/TAZO <=4 SENSITIVE Sensitive     Extended ESBL NEGATIVE Sensitive     * >=100,000 COLONIES/mL ESCHERICHIA COLI  MRSA PCR Screening     Status: None   Collection Time: 06/25/18 10:56 AM  Result Value Ref Range Status   MRSA by PCR NEGATIVE NEGATIVE Final    Comment:        The GeneXpert MRSA Assay (FDA approved for NASAL specimens only), is one component of a comprehensive MRSA colonization surveillance program. It is not intended to diagnose MRSA infection nor to guide or monitor treatment for MRSA infections. Performed at [facility], [address] [place], [place] [address]     Radiology Reports Dg Abd 1 View  Result Date: 06/24/2018 CLINICAL DATA:  73 year old female. Orogastric tube placement. Initial encounter. EXAM: ABDOMEN - 1 VIEW COMPARISON:  06/24/2018 chest x-ray. 10/29/2017 abdominal plain film exam. FINDINGS: Nasogastric tube tip gastric antrum level with side hole gastric body level. Radiopaque structure left upper quadrant unchanged from prior exam of questionable etiology. No free air  detected on this erect view. IMPRESSION: Nasogastric tube tip gastric antrum level with side hole gastric body level. Electronically Signed   By: Genia Del M.D.   On: 06/24/2018 17:41   Ct Head Wo Contrast  Result Date: 06/24/2018 CLINICAL DATA:  Altered mental status with questionable seizure EXAM: CT HEAD WITHOUT CONTRAST TECHNIQUE: Contiguous axial images were obtained from the base of the skull through the vertex without intravenous contrast. COMPARISON:  Head CT May 20, 2018 and brain MRI May 21, 2018 FINDINGS: Brain: Moderate diffuse atrophy is stable. There is no intracranial mass, hemorrhage, extra-axial fluid collection, or midline shift. There is evidence of a prior infarct in the anterior left frontal lobe, midportion, stable. There is extensive small vessel disease throughout the centra semiovale bilaterally. There is evidence of a prior infarct at the right gray-white compartment junction of the posterior right frontal lobe. No acute infarct is demonstrable. Vascular: There is no appreciable hyperdense vessel. There is calcification in each carotid siphon region. Skull: The bony calvarium appears intact. Sinuses/Orbits: There is a retention cyst in the  inferolateral left maxillary antrum. There is slight mucosal thickening in the right posterior maxillary sinus. There is extensive opacification in multiple ethmoid air cells. There is opacification in the posterior sphenoid sinus regions. Orbits appear symmetric bilaterally. Other: Mastoid air cells are clear. IMPRESSION: 1. Stable atrophy with supratentorial small vessel disease. Prior frontal lobe region infarcts bilaterally, larger on the left than on the right, stable. No acute infarct. No mass or hemorrhage. 2.  There are foci of arterial vascular calcification. 3.  There is multifocal paranasal sinus disease. Electronically Signed   By: Lowella Grip III M.D.   On: 06/24/2018 15:10   Dg Chest Port 1 View  Result Date:  06/29/2018 CLINICAL DATA:  Pneumonia. EXAM: PORTABLE CHEST 1 VIEW COMPARISON:  One-view chest x-ray 06/27/2018 FINDINGS: Heart size is normal. Endotracheal tube terminates 2.5 cm above the carina. NG tube courses off the inferior border of the film. Right-sided PICC line is stable. Left greater than right basilar airspace disease is present. This likely reflects atelectasis. No other significant airspace disease is present. IMPRESSION: 1. Support apparatus is stable. 2. Left greater than right basilar airspace disease, likely atelectasis. Electronically Signed   By: San Morelle M.D.   On: 06/29/2018 07:22   Dg Chest Port 1 View  Result Date: 06/27/2018 CLINICAL DATA:  Intubated patient.  Follow-up study. EXAM: PORTABLE CHEST 1 VIEW COMPARISON:  06/26/2018 FINDINGS: Endotracheal tube tip projects 2 cm above the Carina. Nasal/orogastric tube passes below the diaphragm into the stomach. New right PICC line tip projects just above the caval atrial junction. Left pleural effusion noted on the previous day's study is not well-defined. Are prominent bronchovascular markings. Lungs otherwise clear with no evidence of pulmonary edema. No pneumothorax. IMPRESSION: 1. Support apparatus is well positioned. 2. Small left pleural effusion defined on the current AP portable chest radiograph. No acute findings in the lungs. No pneumothorax. Electronically Signed   By: Lajean Manes M.D.   On: 06/27/2018 06:44   Dg Chest Port 1 View  Result Date: 06/26/2018 CLINICAL DATA:  Acute onset of respiratory failure. EXAM: PORTABLE CHEST 1 VIEW COMPARISON:  Chest radiograph performed 06/24/2018 FINDINGS: The patient's endotracheal tube is seen ending 1-2 cm above the carina. This could be retracted 1-2 cm. A small left pleural effusion is noted. No pneumothorax is seen. The cardiomediastinal silhouette is normal in size. No acute osseous abnormalities are identified. The patient's enteric tube is noted extending below the  carina. IMPRESSION: 1. Endotracheal tube seen ending 1-2 cm above the carina. This could be retracted 1-2 cm. 2. Small left pleural effusion noted. Lungs otherwise grossly clear. Electronically Signed   By: Garald Balding M.D.   On: 06/26/2018 09:36   Dg Chest Portable 1 View  Result Date: 06/24/2018 CLINICAL DATA:  To mental status x2 hours. EXAM: PORTABLE CHEST 1 VIEW COMPARISON:  None. FINDINGS: Endotracheal tube tip terminates 4 cm above the carina in satisfactory position. Gastric tube with side port projects below the left hemidiaphragm in the expected location of the stomach. The tip is excluded on this study however. The heart size and mediastinal contours are within normal limits. Mild pulmonary hyperinflation. Minimal aortic atherosclerosis similar to prior. No alveolar consolidation. Remote right-sided rib fractures involving the sixth and seventh ribs. IMPRESSION: 1. Mild pulmonary hyperinflation. 2. Satisfactory support line and tube positions. 3. Minimal aortic atherosclerosis. Electronically Signed   By: Ashley Royalty M.D.   On: 06/24/2018 14:07   [place] Ekg Site Rite  Result Date: 06/27/2018  If [employer] not attached, placement could not be confirmed due to current cardiac rhythm.  [place] Ekg Site Rite  Result Date: 06/27/2018 If Site Rite image not attached, placement could not be confirmed due to current cardiac rhythm.    CBC Recent Labs  Lab 06/24/18 1327  06/25/18 0334 06/26/18 0122 06/27/18 0427 06/27/18 0603 06/27/18 1831 06/28/18 0334 06/29/18 0455  WBC 14.6*   < > 13.7* 12.5* 7.9  --   --  10.5 9.6  HGB 12.0   < > 11.4* 9.3* 7.0* 8.1* 8.4* 8.7* 8.4*  HCT 38.4   < > 37.9 29.8* 23.3* 27.1* 26.9* 28.7* 27.3*  PLT 236   < > 213 172 125*  --   --  171 161  MCV 96.7   < > 99.5 98.3 99.1  --   --  99.3 99.3  MCH 30.2   < > 29.9 30.7 29.8  --   --  30.1 30.5  MCHC 31.3   < > 30.1 31.2 30.0  --   --  30.3 30.8  RDW 14.8   < > 15.0 14.9 15.5  --   --  15.6* 15.3   LYMPHSABS 0.7  --   --  1.2 1.1  --   --  1.2 1.3  MONOABS 0.8  --   --  1.2* 0.8  --   --  1.0 0.9  EOSABS 0.0  --   --  0.1 0.2  --   --  0.4 0.4  BASOSABS 0.1  --   --  0.1 0.1  --   --  0.1 0.1   < > = values in this interval not displayed.    Chemistries  Recent Labs  Lab 06/24/18 1327 06/24/18 2250 06/25/18 0334 06/26/18 0122 06/27/18 0427 06/28/18 0334 06/29/18 0455  NA 143 144 146* 139 135 145 142  K 5.0 5.0 4.4 3.5 3.6 4.5 4.2  CL 116* 116* 114* 109 110 116* 112*  CO2 19* 18* 21* 20* 20* 20* 24  GLUCOSE 140* 120* 111* 123* 90 99 112*  BUN 39* 36* 35* 33* 31* 36* 36*  CREATININE 2.03* 2.03* 2.09* 1.52* 1.23* 1.32* 1.19*  CALCIUM 9.1 8.9 8.6* 7.9* 7.3* 8.9 8.6*  MG  --   --   --  2.0 2.1 2.4 2.3  AST 15 18  --   --   --   --   --   ALT 12 11  --   --   --   --   --   ALKPHOS 103 97  --   --   --   --   --   BILITOT 0.5 0.6  --   --   --   --   --    ------------------------------------------------------------------------------------------------------------------ estimated creatinine clearance is 36.9 mL/min (A) (by C-G formula based on SCr of 1.19 mg/dL (H)). ------------------------------------------------------------------------------------------------------------------ No results for input(s): HGBA1C in the last 72 hours. ------------------------------------------------------------------------------------------------------------------ Recent Labs    06/27/18 0433  TRIG 92   ------------------------------------------------------------------------------------------------------------------ No results for input(s): TSH, T4TOTAL, T3FREE, THYROIDAB in the last 72 hours.  Invalid input(s): FREET3 ------------------------------------------------------------------------------------------------------------------ No results for input(s): VITAMINB12, FOLATE, FERRITIN, TIBC, IRON, RETICCTPCT in the last 72 hours.  Coagulation profile Recent Labs  Lab 06/24/18 1327  06/24/18 1942  INR 1.43 1.44    No results for input(s): DDIMER in the last 72 hours.  Cardiac Enzymes Recent Labs  Lab 06/26/18 0709 06/26/18 1338 06/26/18 1920  TROPONINI 0.03* 0.03*  0.03*   ------------------------------------------------------------------------------------------------------------------ Invalid input(s): POCBNP    Assessment & Plan   72 year old female with history of chronic atrial fibrillation and recent discharge from the hospital at the end of September with subacute CVA who presented to the ER due to unresponsiveness.  *Acute hypoxic respiratory failure: remains on vent - vent mgmt per intensivist - spontaneous BT today possible extubation later today  * Acute metabolic encephalopathy-Neg CT head, -Monitor neuro status  * Sepsis: present on admission due to urinary tract infection Continue IV fluids, Ancef Afebrile  * Atelectasis and LLL pneumonia: Ancef  * E Coli UTI -Cefazolin   * chronic atrial fibrillation with controlled ventricular rate.Was on Eliquis. TEE 12/2017 LVEF 55 to 62% normal systolic function -Rate control and on heparin for therapeutic anticoagulation while intubated  * Acute on CKD stage III - due to sepsis: - improving with hydration  *Recent CVA: Continue Eliquis and statin once extubated        Code Status Orders  (From admission, onward)         Start     Ordered   06/24/18 1648  Full code  Continuous     06/24/18 1648        Code Status History    Date Active Date Inactive Code Status Order ID Comments User Context   05/20/2018 1847 05/21/2018 2212 Full Code [id number]  Nicholes Mango, MD Inpatient   05/20/2018 1847 05/20/2018 1847 Full Code [id number]  Nicholes Mango, MD Inpatient   12/31/2017 1538 01/04/2018 2130 DNR [id number]  Hillary Bow, MD ED   12/13/2017 1630 12/15/2017 2016 Full Code [id number]  Loletha Grayer, MD ED   11/22/2017 0202 11/23/2017 2009 DNR [id number]  Salary, Avel Peace, MD  ED   11/22/2017 0150 11/22/2017 0202 Full Code [id number]  Salary, Avel Peace, MD ED   11/01/2017 1026 11/02/2017 0155 DNR [id number]  Max Sane, MD Inpatient   10/29/2017 1256 11/01/2017 1026 Full Code [id number]  Loletha Grayer, MD ED           Consults intensive  DVT Prophylaxis heparin Lab Results  Component Value Date   PLT 161 06/29/2018     Time Spent in minutes 35-minute greater than 50% of time spent in care coordination and counseling patient regarding the condition and plan of care.   Dustin Flock M.D on 06/29/2018 at 2:31 PM  Between 7am to 6pm - Pager - [phone]  After 6pm go to [email or web address] - Proofreader  [employer]   Office  [phone]

## 2018-06-30 ENCOUNTER — Inpatient Hospital Stay: Payer: Medicare HMO

## 2018-06-30 LAB — CBC WITH DIFFERENTIAL/PLATELET
ABS IMMATURE GRANULOCYTES: 0.05 10*3/uL (ref 0.00–0.07)
BASOS ABS: 0.1 10*3/uL (ref 0.0–0.1)
BASOS PCT: 1 %
Eosinophils Absolute: 0.5 10*3/uL (ref 0.0–0.5)
Eosinophils Relative: 5 %
HCT: 29.3 % — ABNORMAL LOW (ref 36.0–46.0)
Hemoglobin: 9 g/dL — ABNORMAL LOW (ref 12.0–15.0)
Immature Granulocytes: 1 %
Lymphocytes Relative: 12 %
Lymphs Abs: 1.1 10*3/uL (ref 0.7–4.0)
MCH: 30.3 pg (ref 26.0–34.0)
MCHC: 30.7 g/dL (ref 30.0–36.0)
MCV: 98.7 fL (ref 80.0–100.0)
MONOS PCT: 8 %
Monocytes Absolute: 0.8 10*3/uL (ref 0.1–1.0)
NEUTROS ABS: 7 10*3/uL (ref 1.7–7.7)
Neutrophils Relative %: 73 %
PLATELETS: 190 10*3/uL (ref 150–400)
RBC: 2.97 MIL/uL — AB (ref 3.87–5.11)
RDW: 14.9 % (ref 11.5–15.5)
WBC: 9.5 10*3/uL (ref 4.0–10.5)
nRBC: 0 % (ref 0.0–0.2)

## 2018-06-30 LAB — BASIC METABOLIC PANEL
Anion gap: 10 (ref 5–15)
BUN: 31 mg/dL — AB (ref 8–23)
CHLORIDE: 109 mmol/L (ref 98–111)
CO2: 23 mmol/L (ref 22–32)
Calcium: 8.9 mg/dL (ref 8.9–10.3)
Creatinine, Ser: 1.23 mg/dL — ABNORMAL HIGH (ref 0.44–1.00)
GFR calc Af Amer: 50 mL/min — ABNORMAL LOW (ref >60)
GFR calc non Af Amer: 43 mL/min — ABNORMAL LOW (ref >60)
Glucose, Bld: 76 mg/dL (ref 70–99)
POTASSIUM: 3.8 mmol/L (ref 3.5–5.1)
SODIUM: 142 mmol/L (ref 135–145)

## 2018-06-30 LAB — HEPARIN LEVEL (UNFRACTIONATED): Heparin Unfractionated: 0.38 IU/mL (ref 0.30–0.70)

## 2018-06-30 LAB — PHOSPHORUS: PHOSPHORUS: 3.2 mg/dL (ref 2.5–4.6)

## 2018-06-30 LAB — GLUCOSE, CAPILLARY: GLUCOSE-CAPILLARY: 76 mg/dL (ref 70–99)

## 2018-06-30 MED ORDER — POTASSIUM CHLORIDE 10 MEQ/100ML IV SOLN
10.0000 meq | INTRAVENOUS | Status: AC
  Administered 2018-06-30 (×4): 10 meq via INTRAVENOUS
  Filled 2018-06-30 (×4): qty 100

## 2018-06-30 NOTE — Progress Notes (Signed)
Pharmacy Antibiotic Note  Destiny Mack is a 72 y.o. female admitted on 06/24/2018 with sepsis. Patient was hospitalized in September with subacute stroke. Patient has history significant for a.fib and CKD stage III. Patient transferred to ICU from ED requiring mechanical ventilation, still currently requiring. Pharmacy has been consulted for cefazolin dosing.  Plan: Patient remains on cefazolin 1 gm IV Q8H which is appropriate for renal function. Consider stopping antibiotics as this is at least day 7 and we're treating for UTI.   Height: 5\' 4"  (162.6 cm) Weight: 143 lb 8.3 oz (65.1 kg) IBW/kg (Calculated) : 54.7  Temp (24hrs), Avg:99.3 F (37.4 C), Min:98.4 F (36.9 C), Max:100.2 F (37.9 C)  Recent Labs  Lab 06/24/18 1327 06/24/18 1942  06/26/18 0122 06/27/18 0427 06/28/18 0334 06/29/18 0455 06/30/18 0429  WBC 14.6*  --    < > 12.5* 7.9 10.5 9.6 9.5  CREATININE 2.03*  --    < > 1.52* 1.23* 1.32* 1.19* 1.23*  LATICACIDVEN 1.4 1.4  --   --   --   --   --   --    < > = values in this interval not displayed.    Estimated Creatinine Clearance: 35.7 mL/min (A) (by C-G formula based on SCr of 1.23 mg/dL (H)).    No Known Allergies  Antimicrobials this admission: Zosyn 10/28 x 1 Vancomycin 10/28 >> 10/29 Cefepime 10/28 >> 10/31 Cefazolin 10/31>>   Dose adjustments this admission: 11/1 Decreased Cefazolin IV from 1 g q8h to 1 g q12h. 11/2 increased cefazolin to 1g IV every 8 hours     Microbiology results: 10/28 BCx: no growth x 4 days  10/28 UCx: 100,000 CFU E.coli resistant to ampicillin, ciprofloxacin, and unasyn 10/28 MRSA PCR: negative   Thank you for allowing pharmacy to be a part of this patient's care.  Carola Frost, PharmD, BCPS Clinical Pharmacist 06/30/2018 8:05 AM

## 2018-06-30 NOTE — Progress Notes (Signed)
Pharmacy Electrolyte Monitoring Consult:  Pharmacy consulted to assist in monitoring and replacing electrolytes in this 72 y.o. female admitted on 06/24/2018 with sepsis. Patient is currently intubated.  Labs:  Sodium (mmol/L)  Date Value  06/30/2018 142   Potassium (mmol/L)  Date Value  06/30/2018 3.8   Magnesium (mg/dL)  Date Value  11/91/4782 2.3   Phosphorus (mg/dL)  Date Value  95/62/1308 3.2   Calcium (mg/dL)  Date Value  65/78/4696 8.9   Albumin (g/dL)  Date Value  29/52/8413 3.6     Assessment/Plan: Per ICU rounds will replace for goal potassium ~ 4, goal magnesium ~2, and goal corrected calcium > 8.5.   Will give potassium chloride 10 mEq IV Q2H x 4 doses.  Will obtain follow up electrolytes with am labs.   Pharmacy will continue to monitor and adjust per consult.   Carola Frost, PharmD, BCPS Clinical Pharmacist 06/30/2018 8:01 AM

## 2018-06-30 NOTE — Progress Notes (Signed)
   Name: Destiny Mack MRN: [id number] DOB: 23-Nov-1945     CONSULTATION DATE: 06/24/2018  Subjective & objective: Extubated yesterday 06/29/2018, improved mental status and remains on heparin drip for atrial fibrillation  PAST MEDICAL HISTORY :   has a past medical history of A-fib (HCC), Depression, Frequent falls, Hypertension, Leaking of urine, Mixed incontinence, Stroke (HCC), and Tremor of hands and face.  has a past surgical history that includes Tubal ligation; Appendectomy; and TEE without cardioversion (N/A, 01/04/2018). Prior to Admission medications   Medication Sig Start Date End Date Taking? Authorizing Provider  amLODipine (NORVASC) 5 MG tablet Take 5 mg by mouth daily.   Yes [provider]  apixaban (ELIQUIS) 5 MG TABS tablet Take 1 tablet (5 mg total) by mouth 2 (two) times daily. 12/15/17 06/24/18 Yes Pyreddy, Vivien Rota, MD  atorvastatin (LIPITOR) 20 MG tablet Take 20 mg by mouth at bedtime.    Yes [provider]   No Known Allergies  FAMILY HISTORY:  family history includes CAD in her father; Dementia in her mother; Heart failure in her father and mother. SOCIAL HISTORY:  reports that she has been smoking. She has a 28.00 pack-year smoking history. She has never used smokeless tobacco. She reports that she does not drink alcohol or use drugs.  REVIEW OF SYSTEMS:   Unable to obtain due to critical illness   VITAL SIGNS: Temp:  [98.4 F (36.9 C)-100.2 F (37.9 C)] 99.3 F (37.4 C) (11/03 1000) Pulse Rate:  [77-108] 104 (11/03 1000) Resp:  [13-25] 15 (11/03 1000) BP: (104-166)/(57-83) 143/76 (11/03 1000) SpO2:  [95 %-100 %] 98 % (11/03 1000) FiO2 (%):  [28 %] 28 % (11/02 1200)  Physical Examination: Awake, slow to respond moving extremities and following simple commands On nasal cannula 2 L/min, no distress, bilateral equal air entry with no adventitious sounds S1 & S2 are audible with no murmur Benign abdominal exam with feeble  peristalsis Wasted extremities and no edema  ASSESSMENT / PLAN: Acute respiratory failure intubated for airway protection with altered mental status.  Extubated on 06/29/2018 -Monitor work of breathing and O2 sat  Altered mental status (improved) with toxic metabolic encephalopathy/hypertensive emergency.No acute intracranial abnormalities on the CT head,bilateral frontal lobe infarct left larger than the right stable multifocal paranasal sinus disease -Monitor neuro status  Hypertensive emergency with altered mental status -Optimize antihypertensives and monitor hemodynamics  Atelectasis and pneumonia.ImprovedLeft lower airspace disease -c/wCefepime +offvancomycin. MRSA PCR -ve -Monitor CXR + CBC + FiO2 requirement. Follow with MRSA PCR and cultures  UTI. E Coli. -Cefazolin + d/cCefepime +d/cvancomycin. Monitor renal culture  Sepsisis less likely was procalcitonin level less than 0.1. -Empiric antimicrobials, monitor procalcitonin and consider de-escalation of antimicrobial's final culture results.  chronic atrial fibrillation with controlled ventricular rate.Was on Eliquis. TEE 12/2017 LVEF 55 to 60% normal systolic function -Rate control and start on heparin for therapeutic anticoagulation while intubated  AKI(improved)on CKD stage III with intravascular volume depletion -Optimize hydration, avoid nephrotoxins, monitor renal panel and urine output.  Anemia -Keep hemoglobin more than 7 g/dL  Full code  DVT &GI prophylaxis. Continue with supportive care. Family was updated at the bedside and they agreed to the plan of management.  Critical care time62minutes

## 2018-06-30 NOTE — Progress Notes (Signed)
ANTICOAGULATION CONSULT NOTE  Pharmacy Consult for heparin Indication: atrial fibrillation  No Known Allergies  Patient Measurements: Height: 5\' 4"  (162.6 cm) Weight: 143 lb 8.3 oz (65.1 kg) IBW/kg (Calculated) : 54.7 Heparin Dosing Weight: 63.8 kg  Vital Signs: Temp: 99.1 F (37.3 C) (11/03 0400) BP: 154/65 (11/03 0400) Pulse Rate: 95 (11/03 0400)  Labs: Recent Labs    06/28/18 0334 06/28/18 1248 06/28/18 2257 06/29/18 0455 06/30/18 0429  HGB 8.7*  --   --  8.4* 9.0*  HCT 28.7*  --   --  27.3* 29.3*  PLT 171  --   --  161 190  APTT 66* 65* 90* 83*  --   HEPARINUNFRC 0.85*  --  0.72* 0.61 0.38  CREATININE 1.32*  --   --  1.19*  --     Estimated Creatinine Clearance: 36.9 mL/min (A) (by C-G formula based on SCr of 1.19 mg/dL (H)).   Medical History: Past Medical History:  Diagnosis Date  . A-fib (HCC)   . Depression   . Frequent falls   . Hypertension   . Leaking of urine   . Mixed incontinence   . Stroke (HCC)   . Tremor of hands and face     Medications:  Infusions:  . sodium chloride Stopped (06/27/18 0448)  .  ceFAZolin (ANCEF) IV Stopped (06/29/18 2317)  . heparin 700 Units/hr (06/30/18 0300)  . lactated ringers 50 mL/hr at 06/30/18 0300    Assessment: Pharmacy consulted for heparin drip management for 72 yo female admitted to ICU on 10/28 for sepsis. Patient requiring mechanical ventilation and was transitioned to heparin drip from apixaban (PTA medication). Patient did not receive apixaban inpatient. Patient with past medical history significant for chronic Afib, CKD 3, and recent discharge for subacute stroke. CHA2DS2-VASc Score of at least 6 (Age > 75, Gender, HTN, Stroke).    Goal of Therapy:  Heparin level 0.3-0.7 units/ml  APTT: 66-102 s Monitor platelets by anticoagulation protocol: Yes   Plan:  Increase heparin infusion from 650 units/hr to 700 units/hr based on subtherapeutic aPTT of 65 @ 1248. Will check aPTT/HL in 7 hours as patient  is > 70 years.  11/1 PM heparin level 0.72, aPTT 90. Continue current regimen and recheck with AM labs.  11/2 AM heparin level 0.61, aPTT 83. Now correlating in therapeutic range. Will monitor and adjust by heparin level only. Recheck heparin level and CBC with tomorrow AM labs.  11/3 AM heparin level 0.38. Continue current regimen. Recheck heparin level and CBC with tomorrow AM labs.  Pharmacy will continue to monitor.  Fulton Reek, PharmD, BCPS  06/30/18 5:40 AM

## 2018-06-30 NOTE — Progress Notes (Signed)
Patient has been awake most of the night. Will cough up phlegm and then re swallow. When try to suction patient, patient says "no," but did get to suction patient once which made her briefly agitated. Daughter called to inquire about patient and told her about her not being able to cough productively. Daughter will be in around eleven. No signs or symptoms of pain or discomfort. Continue to monitor.

## 2018-06-30 NOTE — Progress Notes (Signed)
[employer] - [place] [facility]                                                                                                                                                                                  Patient Demographics   Destiny Mack, is a 71 y.o. female, DOB - 1946/01/15, ZOX:[id number]  Admit date - 06/24/2018   Admitting Physician Adrian Saran, MD  Outpatient Primary MD for the patient is Bender, Earl Lagos, MD   LOS - 6  Subjective: Patient extubated Has upper airway wheezing according to patient that appears to be chronic     Review of Systems:    CONSTITUTIONAL: No documented fever. No fatigue, weakness. No weight gain, no weight loss.  EYES: No blurry or double vision.  ENT: No tinnitus. No postnasal drip. No redness of the oropharynx.  RESPIRATORY: Upper airway wheezing no cough, no wheeze, no hemoptysis. No dyspnea.  CARDIOVASCULAR: No chest pain. No orthopnea. No palpitations. No syncope.  GASTROINTESTINAL: No nausea, no vomiting or diarrhea. No abdominal pain. No melena or hematochezia.  GENITOURINARY:  No urgency. No frequency. No dysuria. No hematuria. No obstructive symptoms. No discharge. No pain. No significant abnormal bleeding ENDOCRINE: No polyuria or nocturia. No heat or cold intolerance.  HEMATOLOGY: No anemia. No bruising. No bleeding. No purpura. No petechiae INTEGUMENTARY: No rashes. No lesions.  MUSCULOSKELETAL: No arthritis. No swelling. No gout.  NEUROLOGIC: No numbness, tingling, or ataxia. No seizure-type activity.  PSYCHIATRIC: No anxiety. No insomnia. No ADD.     Vitals:   Vitals:   06/30/18 0800 06/30/18 0809 06/30/18 0900 06/30/18 1000  BP: 130/80  (!) 146/66 (!) 143/76  Pulse: 97  97 (!) 104  Resp: 19  15 15   Temp: 99 F (37.2 C)  99.1 F (37.3 C) 99.3 F (37.4 C)  TempSrc: Bladder     SpO2: 97% 97% 99% 98%  Weight:      Height:        Wt Readings from Last 3 Encounters:  06/29/18 65.1 kg  05/20/18  59.2 kg  01/09/18 77.1 kg     Intake/Output Summary (Last 24 hours) at 06/30/2018 1324 Last data filed at 06/30/2018 0800 Gross per 24 hour  Intake 1153.99 ml  Output 1916 ml  Net -762.01 ml    Physical Exam:   GENERAL: Critically ill on the vent HEAD, EYES, EARS, NOSE AND THROAT: Atraumatic, normocephalic. Extraocular muscles are intact. Pupils equal and reactive to light. Sclerae anicteric. No conjunctival injection. No oro-pharyngeal erythema.  NECK: Supple. There is no jugular venous distention. No bruits, no lymphadenopathy, no thyromegaly.  HEART: Regular rate and rhythm,. No murmurs, no rubs, no  clicks.  LUNGS: Upper airway wheezing y. No rales or rhonchi. No wheezes.  ABDOMEN: Soft, flat, nontender, nondistended. Has good bowel sounds. No hepatosplenomegaly appreciated.  EXTREMITIES: No evidence of any cyanosis, clubbing, or peripheral edema.  +2 pedal and radial pulses bilaterally.  NEUROLOGIC: Sedated on the vent SKIN: Moist and warm with no rashes appreciated.  Psych: Not anxious, depressed LN: No inguinal LN enlargement    Antibiotics   Anti-infectives (From admission, onward)   Start     Dose/Rate Route Frequency Ordered Stop   06/29/18 1400  ceFAZolin (ANCEF) IVPB 1 g/50 mL premix     1 g 100 mL/hr over 30 Minutes Intravenous Every 8 hours 06/29/18 0819     06/28/18 1700  ceFAZolin (ANCEF) IVPB 1 g/50 mL premix  Status:  Discontinued     1 g 100 mL/hr over 30 Minutes Intravenous Every 12 hours 06/28/18 1135 06/29/18 0819   06/27/18 2200  ceFAZolin (ANCEF) IVPB 1 g/50 mL premix  Status:  Discontinued     1 g 100 mL/hr over 30 Minutes Intravenous Every 8 hours 06/27/18 1642 06/28/18 1135   06/25/18 1200  vancomycin (VANCOCIN) IVPB 1000 mg/200 mL premix  Status:  Discontinued     1,000 mg 200 mL/hr over 60 Minutes Intravenous Every 36 hours 06/24/18 1535 06/25/18 1551   06/24/18 2200  piperacillin-tazobactam (ZOSYN) IVPB 3.375 g  Status:  Discontinued     3.375  g 12.5 mL/hr over 240 Minutes Intravenous Every 8 hours 06/24/18 1535 06/24/18 1635   06/24/18 2200  ceFEPIme (MAXIPIME) 1 g in sodium chloride 0.9 % 100 mL IVPB  Status:  Discontinued     1 g 200 mL/hr over 30 Minutes Intravenous Every 24 hours 06/24/18 1637 06/27/18 1642   06/24/18 1535  vancomycin variable dose per unstable renal function (pharmacist dosing)  Status:  Discontinued      Does not apply See admin instructions 06/24/18 1535 06/25/18 1039   06/24/18 1445  vancomycin (VANCOCIN) IVPB 1000 mg/200 mL premix     1,000 mg 200 mL/hr over 60 Minutes Intravenous  Once 06/24/18 1441 06/24/18 1653   06/24/18 1445  piperacillin-tazobactam (ZOSYN) IVPB 3.375 g     3.375 g 100 mL/hr over 30 Minutes Intravenous  Once 06/24/18 1441 06/24/18 1545      Medications   Scheduled Meds: . sodium chloride   Intravenous Once  . amLODipine  10 mg Per Tube Daily  . atorvastatin  20 mg Per Tube QHS  . budesonide (PULMICORT) nebulizer solution  0.25 mg Nebulization BID  . famotidine  20 mg Per Tube QHS  . ipratropium-albuterol  3 mL Nebulization Q6H  . mouth rinse  15 mL Mouth Rinse BID  . sodium chloride flush  10-40 mL Intracatheter Q12H   Continuous Infusions: . sodium chloride Stopped (06/27/18 0448)  .  ceFAZolin (ANCEF) IV Stopped (06/30/18 0981)  . heparin 700 Units/hr (06/30/18 0800)  . lactated ringers 50 mL/hr at 06/30/18 0942  . potassium chloride 10 mEq (06/30/18 1217)   PRN Meds:.sodium chloride, acetaminophen **OR** acetaminophen, labetalol, senna-docusate, sodium chloride flush   Data Review:   Micro Results Recent Results (from the past 240 hour(s))  Culture, blood (Routine x 2)     Status: None   Collection Time: 06/24/18  1:20 PM  Result Value Ref Range Status   Specimen Description BLOOD BLOOD LEFT ARM  Final   Special Requests   Final    BOTTLES DRAWN AEROBIC AND ANAEROBIC Blood Culture adequate volume  Culture   Final    NO GROWTH 5 DAYS Performed at [facility], [address] [place], [place] [address]    Report Status 06/29/2018 FINAL  Final  Culture, blood (Routine x 2)     Status: None   Collection Time: 06/24/18  1:20 PM  Result Value Ref Range Status   Specimen Description BLOOD BLOOD RIGHT HAND  Final   Special Requests   Final    BOTTLES DRAWN AEROBIC AND ANAEROBIC Blood Culture results may not be optimal due to an excessive volume of blood received in culture bottles   Culture   Final    NO GROWTH 5 DAYS Performed at [facility], [address] [place], [place] [address]    Report Status 06/29/2018 FINAL  Final  Urine Culture     Status: Abnormal   Collection Time: 06/24/18  1:54 PM  Result Value Ref Range Status   Specimen Description   Final    URINE, RANDOM Performed at [facility], [address], [place], [place] [address]    Special Requests   Final    Normal Performed at [facility], [address] [place], [place] [address]    Culture >=100,000 COLONIES/mL ESCHERICHIA COLI (A)  Final   Report Status 06/27/2018 FINAL  Final   Organism ID, Bacteria ESCHERICHIA COLI (A)  Final      Susceptibility   Escherichia coli - MIC*    AMPICILLIN >=32 RESISTANT Resistant     CEFAZOLIN <=4 SENSITIVE Sensitive     CEFTRIAXONE <=1 SENSITIVE Sensitive     CIPROFLOXACIN >=4 RESISTANT Resistant     GENTAMICIN <=1 SENSITIVE Sensitive     IMIPENEM <=0.25 SENSITIVE Sensitive     NITROFURANTOIN <=16 SENSITIVE Sensitive     TRIMETH/SULFA <=20 SENSITIVE Sensitive     AMPICILLIN/SULBACTAM 16 INTERMEDIATE Intermediate     PIP/TAZO <=4 SENSITIVE Sensitive     Extended ESBL NEGATIVE Sensitive     * >=100,000 COLONIES/mL ESCHERICHIA COLI  MRSA PCR Screening     Status: None   Collection Time: 06/25/18 10:56 AM  Result Value Ref Range Status   MRSA by PCR NEGATIVE NEGATIVE Final    Comment:        The GeneXpert MRSA Assay (FDA approved for NASAL specimens only), is one component of  a comprehensive MRSA colonization surveillance program. It is not intended to diagnose MRSA infection nor to guide or monitor treatment for MRSA infections. Performed at [facility], [address] [place], [place] [address]     Radiology Reports Dg Abd 1 View  Result Date: 06/24/2018 CLINICAL DATA:  72 year old female. Orogastric tube placement. Initial encounter. EXAM: ABDOMEN - 1 VIEW COMPARISON:  06/24/2018 chest x-ray. 10/29/2017 abdominal plain film exam. FINDINGS: Nasogastric tube tip gastric antrum level with side hole gastric body level. Radiopaque structure left upper quadrant unchanged from prior exam of questionable etiology. No free air detected on this erect view. IMPRESSION: Nasogastric tube tip gastric antrum level with side hole gastric body level. Electronically Signed   By: Lacy Duverney M.D.   On: 06/24/2018 17:41   Ct Head Wo Contrast  Result Date: 06/24/2018 CLINICAL DATA:  Altered mental status with questionable seizure EXAM: CT HEAD WITHOUT CONTRAST TECHNIQUE: Contiguous axial images were obtained from the base of the skull through the vertex without intravenous contrast. COMPARISON:  Head CT May 20, 2018 and brain MRI May 21, 2018 FINDINGS: Brain: Moderate diffuse atrophy is stable. There is no  intracranial mass, hemorrhage, extra-axial fluid collection, or midline shift. There is evidence of a prior infarct in the anterior left frontal lobe, midportion, stable. There is extensive small vessel disease throughout the centra semiovale bilaterally. There is evidence of a prior infarct at the right gray-white compartment junction of the posterior right frontal lobe. No acute infarct is demonstrable. Vascular: There is no appreciable hyperdense vessel. There is calcification in each carotid siphon region. Skull: The bony calvarium appears intact. Sinuses/Orbits: There is a retention cyst in the inferolateral left maxillary antrum. There is slight  mucosal thickening in the right posterior maxillary sinus. There is extensive opacification in multiple ethmoid air cells. There is opacification in the posterior sphenoid sinus regions. Orbits appear symmetric bilaterally. Other: Mastoid air cells are clear. IMPRESSION: 1. Stable atrophy with supratentorial small vessel disease. Prior frontal lobe region infarcts bilaterally, larger on the left than on the right, stable. No acute infarct. No mass or hemorrhage. 2.  There are foci of arterial vascular calcification. 3.  There is multifocal paranasal sinus disease. Electronically Signed   By: Bretta Bang III M.D.   On: 06/24/2018 15:10   Dg Chest Port 1 View  Result Date: 06/30/2018 CLINICAL DATA:  Pneumonia. EXAM: PORTABLE CHEST 1 VIEW COMPARISON:  06/2018 FINDINGS: The patient has been extubated. Endotracheal tube has been removed. Right PICC line in stable position. Cardiomediastinal silhouette is normal. Mediastinal contours appear intact. Calcific atherosclerotic disease of the aorta and tortuosity. There is no evidence of pleural effusion or pneumothorax. Minimal peribronchial thickening in the left lower lobe. Osseous structures are without acute abnormality. Soft tissues are grossly normal. IMPRESSION: Minimal peribronchial thickening in the left lower lobe may represent atelectasis or airspace consolidation. Electronically Signed   By: Ted Mcalpine M.D.   On: 06/30/2018 07:09   Dg Chest Port 1 View  Result Date: 06/29/2018 CLINICAL DATA:  Pneumonia. EXAM: PORTABLE CHEST 1 VIEW COMPARISON:  One-view chest x-ray 06/27/2018 FINDINGS: Heart size is normal. Endotracheal tube terminates 2.5 cm above the carina. NG tube courses off the inferior border of the film. Right-sided PICC line is stable. Left greater than right basilar airspace disease is present. This likely reflects atelectasis. No other significant airspace disease is present. IMPRESSION: 1. Support apparatus is stable. 2. Left  greater than right basilar airspace disease, likely atelectasis. Electronically Signed   By: Marin Roberts M.D.   On: 06/29/2018 07:22   Dg Chest Port 1 View  Result Date: 06/27/2018 CLINICAL DATA:  Intubated patient.  Follow-up study. EXAM: PORTABLE CHEST 1 VIEW COMPARISON:  06/26/2018 FINDINGS: Endotracheal tube tip projects 2 cm above the Carina. Nasal/orogastric tube passes below the diaphragm into the stomach. New right PICC line tip projects just above the caval atrial junction. Left pleural effusion noted on the previous day's study is not well-defined. Are prominent bronchovascular markings. Lungs otherwise clear with no evidence of pulmonary edema. No pneumothorax. IMPRESSION: 1. Support apparatus is well positioned. 2. Small left pleural effusion defined on the current AP portable chest radiograph. No acute findings in the lungs. No pneumothorax. Electronically Signed   By: Amie Portland M.D.   On: 06/27/2018 06:44   Dg Chest Port 1 View  Result Date: 06/26/2018 CLINICAL DATA:  Acute onset of respiratory failure. EXAM: PORTABLE CHEST 1 VIEW COMPARISON:  Chest radiograph performed 06/24/2018 FINDINGS: The patient's endotracheal tube is seen ending 1-2 cm above the carina. This could be retracted 1-2 cm. A small left pleural effusion is noted. No pneumothorax is seen.  The cardiomediastinal silhouette is normal in size. No acute osseous abnormalities are identified. The patient's enteric tube is noted extending below the carina. IMPRESSION: 1. Endotracheal tube seen ending 1-2 cm above the carina. This could be retracted 1-2 cm. 2. Small left pleural effusion noted. Lungs otherwise grossly clear. Electronically Signed   By: Roanna Raider M.D.   On: 06/26/2018 09:36   Dg Chest Portable 1 View  Result Date: 06/24/2018 CLINICAL DATA:  To mental status x2 hours. EXAM: PORTABLE CHEST 1 VIEW COMPARISON:  None. FINDINGS: Endotracheal tube tip terminates 4 cm above the carina in satisfactory  position. Gastric tube with side port projects below the left hemidiaphragm in the expected location of the stomach. The tip is excluded on this study however. The heart size and mediastinal contours are within normal limits. Mild pulmonary hyperinflation. Minimal aortic atherosclerosis similar to prior. No alveolar consolidation. Remote right-sided rib fractures involving the sixth and seventh ribs. IMPRESSION: 1. Mild pulmonary hyperinflation. 2. Satisfactory support line and tube positions. 3. Minimal aortic atherosclerosis. Electronically Signed   By: Tollie Eth M.D.   On: 06/24/2018 14:07   [place] Ekg Site Rite  Result Date: 06/27/2018 If Site Rite image not attached, placement could not be confirmed due to current cardiac rhythm.  [place] Ekg Site Rite  Result Date: 06/27/2018 If Site Rite image not attached, placement could not be confirmed due to current cardiac rhythm.    CBC Recent Labs  Lab 06/26/18 0122 06/27/18 0427 06/27/18 0603 06/27/18 1831 06/28/18 0334 06/29/18 0455 06/30/18 0429  WBC 12.5* 7.9  --   --  10.5 9.6 9.5  HGB 9.3* 7.0* 8.1* 8.4* 8.7* 8.4* 9.0*  HCT 29.8* 23.3* 27.1* 26.9* 28.7* 27.3* 29.3*  PLT 172 125*  --   --  171 161 190  MCV 98.3 99.1  --   --  99.3 99.3 98.7  MCH 30.7 29.8  --   --  30.1 30.5 30.3  MCHC 31.2 30.0  --   --  30.3 30.8 30.7  RDW 14.9 15.5  --   --  15.6* 15.3 14.9  LYMPHSABS 1.2 1.1  --   --  1.2 1.3 1.1  MONOABS 1.2* 0.8  --   --  1.0 0.9 0.8  EOSABS 0.1 0.2  --   --  0.4 0.4 0.5  BASOSABS 0.1 0.1  --   --  0.1 0.1 0.1    Chemistries  Recent Labs  Lab 06/24/18 1327 06/24/18 2250  06/26/18 0122 06/27/18 0427 06/28/18 0334 06/29/18 0455 06/30/18 0429  NA 143 144   < > 139 135 145 142 142  K 5.0 5.0   < > 3.5 3.6 4.5 4.2 3.8  CL 116* 116*   < > 109 110 116* 112* 109  CO2 19* 18*   < > 20* 20* 20* 24 23  GLUCOSE 140* 120*   < > 123* 90 99 112* 76  BUN 39* 36*   < > 33* 31* 36* 36* 31*  CREATININE 2.03* 2.03*   < > 1.52*  1.23* 1.32* 1.19* 1.23*  CALCIUM 9.1 8.9   < > 7.9* 7.3* 8.9 8.6* 8.9  MG  --   --   --  2.0 2.1 2.4 2.3  --   AST 15 18  --   --   --   --   --   --   ALT 12 11  --   --   --   --   --   --  ALKPHOS 103 97  --   --   --   --   --   --   BILITOT 0.5 0.6  --   --   --   --   --   --    < > = values in this interval not displayed.   ------------------------------------------------------------------------------------------------------------------ estimated creatinine clearance is 35.7 mL/min (A) (by C-G formula based on SCr of 1.23 mg/dL (H)). ------------------------------------------------------------------------------------------------------------------ No results for input(s): HGBA1C in the last 72 hours. ------------------------------------------------------------------------------------------------------------------ No results for input(s): CHOL, HDL, LDLCALC, TRIG, CHOLHDL, LDLDIRECT in the last 72 hours. ------------------------------------------------------------------------------------------------------------------ No results for input(s): TSH, T4TOTAL, T3FREE, THYROIDAB in the last 72 hours.  Invalid input(s): FREET3 ------------------------------------------------------------------------------------------------------------------ No results for input(s): VITAMINB12, FOLATE, FERRITIN, TIBC, IRON, RETICCTPCT in the last 72 hours.  Coagulation profile Recent Labs  Lab 06/24/18 1327 06/24/18 1942  INR 1.43 1.44    No results for input(s): DDIMER in the last 72 hours.  Cardiac Enzymes Recent Labs  Lab 06/26/18 0709 06/26/18 1338 06/26/18 1920  TROPONINI 0.03* 0.03* 0.03*   ------------------------------------------------------------------------------------------------------------------ Invalid input(s): POCBNP    Assessment & Plan   72 year old female with history of chronic atrial fibrillation and recent discharge from the hospital at the end of September with  subacute CVA who presented to the ER due to unresponsiveness.  *Acute hypoxic respiratory failure: Status post extubation-   * Acute metabolic encephalopathy-Neg CT head, -Improved  * Sepsis: present on admission due to urinary tract infection Continue IV fluids, Ancef Afebrile  * Atelectasis and LLL pneumonia: Ancef  * E Coli UTI -Cefazolin   * chronic atrial fibrillation with controlled ventricular rate.Was on Eliquis. TEE 12/2017 LVEF 55 to 60% normal systolic function -Rate control and on heparin for therapeutic anticoagulation while intubated  * Acute on CKD stage III - due to sepsis: - improving with hydration  *Recent CVA: Continue Eliquis and statin once extubated  Gnosis likely poor      Code Status Orders  (From admission, onward)         Start     Ordered   06/24/18 1648  Full code  Continuous     06/24/18 1648        Code Status History    Date Active Date Inactive Code Status Order ID Comments User Context   05/20/2018 1847 05/21/2018 2212 Full Code [id number]  Ramonita Lab, MD Inpatient   05/20/2018 1847 05/20/2018 1847 Full Code [id number]  Ramonita Lab, MD Inpatient   12/31/2017 1538 01/04/2018 2130 DNR [id number]  Milagros Loll, MD ED   12/13/2017 1630 12/15/2017 2016 Full Code [id number]  Alford Highland, MD ED   11/22/2017 0202 11/23/2017 2009 DNR [id number]  Salary, Evelena Asa, MD ED   11/22/2017 0150 11/22/2017 0202 Full Code [id number]  Salary, Evelena Asa, MD ED   11/01/2017 1026 11/02/2017 0155 DNR [id number]  Delfino Lovett, MD Inpatient   10/29/2017 1256 11/01/2017 1026 Full Code [id number]  Alford Highland, MD ED           Consults intensive  DVT Prophylaxis heparin Lab Results  Component Value Date   PLT 190 06/30/2018     Time Spent in minutes 35-minute greater than 50% of time spent in care coordination and counseling patient regarding the condition and plan of care.   Auburn Bilberry M.D on 06/30/2018 at 1:24 PM  Between 7am to  6pm - Pager - [phone]  After 6pm go to [email or web address] - Social research officer, government  [employer]   Office  [phone]

## 2018-07-01 LAB — CBC WITH DIFFERENTIAL/PLATELET
Abs Immature Granulocytes: 0.03 10*3/uL (ref 0.00–0.07)
BASOS ABS: 0.1 10*3/uL (ref 0.0–0.1)
Basophils Relative: 1 %
EOS ABS: 0.4 10*3/uL (ref 0.0–0.5)
EOS PCT: 5 %
HEMATOCRIT: 30.1 % — AB (ref 36.0–46.0)
HEMOGLOBIN: 9.4 g/dL — AB (ref 12.0–15.0)
IMMATURE GRANULOCYTES: 0 %
LYMPHS PCT: 11 %
Lymphs Abs: 1 10*3/uL (ref 0.7–4.0)
MCH: 30.7 pg (ref 26.0–34.0)
MCHC: 31.2 g/dL (ref 30.0–36.0)
MCV: 98.4 fL (ref 80.0–100.0)
Monocytes Absolute: 0.8 10*3/uL (ref 0.1–1.0)
Monocytes Relative: 10 %
NEUTROS PCT: 73 %
NRBC: 0 % (ref 0.0–0.2)
Neutro Abs: 6.3 10*3/uL (ref 1.7–7.7)
Platelets: 190 10*3/uL (ref 150–400)
RBC: 3.06 MIL/uL — AB (ref 3.87–5.11)
RDW: 14.5 % (ref 11.5–15.5)
WBC: 8.6 10*3/uL (ref 4.0–10.5)

## 2018-07-01 LAB — GLUCOSE, CAPILLARY: GLUCOSE-CAPILLARY: 144 mg/dL — AB (ref 70–99)

## 2018-07-01 LAB — MAGNESIUM: Magnesium: 2 mg/dL (ref 1.7–2.4)

## 2018-07-01 LAB — HEPARIN LEVEL (UNFRACTIONATED): HEPARIN UNFRACTIONATED: 0.24 [IU]/mL — AB (ref 0.30–0.70)

## 2018-07-01 LAB — BASIC METABOLIC PANEL
ANION GAP: 11 (ref 5–15)
BUN: 25 mg/dL — AB (ref 8–23)
CO2: 23 mmol/L (ref 22–32)
Calcium: 8.8 mg/dL — ABNORMAL LOW (ref 8.9–10.3)
Chloride: 105 mmol/L (ref 98–111)
Creatinine, Ser: 1.28 mg/dL — ABNORMAL HIGH (ref 0.44–1.00)
GFR calc Af Amer: 47 mL/min — ABNORMAL LOW (ref >60)
GFR, EST NON AFRICAN AMERICAN: 41 mL/min — AB (ref >60)
GLUCOSE: 75 mg/dL (ref 70–99)
POTASSIUM: 4.5 mmol/L (ref 3.5–5.1)
Sodium: 139 mmol/L (ref 135–145)

## 2018-07-01 LAB — PHOSPHORUS: Phosphorus: 3.2 mg/dL (ref 2.5–4.6)

## 2018-07-01 MED ORDER — IPRATROPIUM-ALBUTEROL 0.5-2.5 (3) MG/3ML IN SOLN: 3.0000 mL | Freq: Four times a day (QID) | RESPIRATORY_TRACT | Status: DC | PRN

## 2018-07-01 MED ORDER — APIXABAN 5 MG PO TABS
5.0000 mg | ORAL_TABLET | Freq: Two times a day (BID) | ORAL | Status: DC
  Administered 2018-07-01 – 2018-07-03 (×4): 5 mg via ORAL
  Filled 2018-07-01 (×5): qty 1

## 2018-07-01 MED ORDER — HEPARIN BOLUS VIA INFUSION
1000.0000 [IU] | Freq: Once | INTRAVENOUS | Status: AC
  Administered 2018-07-01: 1000 [IU] via INTRAVENOUS
  Filled 2018-07-01: qty 1000

## 2018-07-01 MED ORDER — APIXABAN 5 MG PO TABS: 5.0000 mg | ORAL_TABLET | Freq: Two times a day (BID) | ORAL | Status: DC

## 2018-07-01 MED ORDER — IPRATROPIUM-ALBUTEROL 0.5-2.5 (3) MG/3ML IN SOLN
3.0000 mL | Freq: Three times a day (TID) | RESPIRATORY_TRACT | Status: DC
  Administered 2018-07-02 (×2): 3 mL via RESPIRATORY_TRACT
  Filled 2018-07-01 (×4): qty 3

## 2018-07-01 NOTE — Evaluation (Signed)
Physical Therapy Evaluation Patient Details Name: Destiny Mack MRN: [id number] DOB: 1946-01-25 Today's Date: 07/01/2018   History of Present Illness  presented to ER after being found non-responsive, noted with hypoxia; admitted with sepsis related to UTI, requiring intubation 10/28-11/2 for respiratory support.  Clinical Impression  Patient notably fatigued (intermittently closing eyes during session), but awakens easily and agreeable to participation with session.  Daughter at bedside; very supportive/encouraging to patient.  Patient with baseline hemiparesis noted (R > L); inconsistent reports of sensory awareness.  Initially presents with impaired tone (increased rigidity); howeve,r suspect presentation largely due to apraxia and difficulty initiating/terminating muscles for purposeful movement (as patient noted to spontaneously mobilize L > R extremities at times during session.  Very limited ability to actively participate with mobility tasks as a result. Anticipate need for heavy +2 with any/all mobility attempts.  Will continue to assess/progress in subsequent sessions as appropriate (lunch arrived). Would benefit from skilled PT to address above deficits and promote optimal return to PLOF; recommend transition to STR upon discharge from acute hospitalization.     Follow Up Recommendations SNF    Equipment Recommendations       Recommendations for Other Services       Precautions / Restrictions Precautions Precautions: Fall Precaution Comments: Nectar liquids Restrictions Weight Bearing Restrictions: No      Mobility  Bed Mobility               General bed mobility comments: deferred secondary to lunch arrival; anticipate need for +2  Transfers                 General transfer comment: deferred secondary to lunch arrival; anticipate need for +2  Ambulation/Gait             General Gait Details: non-ambulatory at baseline  Stairs             Wheelchair Mobility    Modified Rankin (Stroke Patients Only)       Balance                                             Pertinent Vitals/Pain Pain Assessment: No/denies pain    Home Living Family/patient expects to be discharged to:: Private residence Living Arrangements: Children Available Help at Discharge: Family;Available 24 hours/day Type of Home: Apartment Home Access: Level entry     Home Layout: Two level;Able to live on main level with bedroom/bathroom Home Equipment: Bedside commode;Wheelchair - manual;Hospital bed      Prior Function Level of Independence: Needs assistance         Comments: Non-ambulatory at baseline, performing transfers to/from manual WC with significant assist from son.  Assist from daughter as needed for ADLs, grooming (wears depends, uses bedpan)     Hand Dominance   Dominant Hand: Right    Extremity/Trunk Assessment   Upper Extremity Assessment Upper Extremity Assessment: Generalized weakness(R UE grossly 2-/5; L UE grossly 3-/5.  Difficulty coordinating purposeful movement bilat)    Lower Extremity Assessment Lower Extremity Assessment: Generalized weakness(R LE grossly 2-/5, L LE grossly 3+/5)       Communication   Communication: No difficulties  Cognition Arousal/Alertness: Lethargic Behavior During Therapy: WFL for tasks assessed/performed Overall Cognitive Status: Difficult to assess  General Comments: delayed response, inconsistent attention to task and interaction with therapist      General Comments      Exercises Other Exercises Other Exercises: Supine LE therex, 1x10, act assist ROM: ankle pumps, heel slides, hip abduct/adduct.  Inconsistent active movement of LEs; significant difficulty coordinating initiation/termination of muscles.  Appears somewhat rigid, though 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   Assessment/Plan    PT Assessment Patient  needs continued PT services  PT Problem List Decreased activity tolerance;Decreased strength;Decreased mobility;Decreased cognition;Decreased safety awareness;Cardiopulmonary status limiting activity;Impaired tone;Decreased range of motion;Decreased coordination;Decreased balance;Decreased knowledge of use of DME;Decreased knowledge of precautions       PT Treatment Interventions DME instruction;Functional mobility training;Therapeutic activities;Balance training;Cognitive remediation;Therapeutic exercise;Neuromuscular re-education;Patient/family education    PT Goals (Current goals can be found in the Care Plan section)  Acute Rehab PT Goals Patient Stated Goal: to see how she does with her arms and legs (per daughter) PT Goal Formulation: With patient/family Time For Goal Achievement: 07/15/18 Potential to Achieve Goals: Fair    Frequency Min 2X/week   Barriers to discharge        Co-evaluation               AM-PAC PT "6 Clicks" Daily Activity  Outcome Measure Difficulty turning over in bed (including adjusting bedclothes, sheets and blankets)?: Unable Difficulty moving from lying on back to sitting on the side of the bed? : Unable Difficulty sitting down on and standing up from a chair with arms (e.g., wheelchair, bedside commode, etc,.)?: Unable Help needed moving to and from a bed to chair (including a wheelchair)?: Total Help needed walking in hospital room?: Total Help needed climbing 3-5 steps with a railing? : Total 6 Click Score: 6    End of Session   Activity Tolerance: Patient limited by fatigue Patient left: in bed;with call bell/phone within reach;with family/visitor present;with bed alarm set   PT Visit Diagnosis: Muscle weakness (generalized) (M62.81);Apraxia (R48.2)    Time: 1308-6578 PT Time Calculation (min) (ACUTE ONLY): 20 min   Charges:   PT Evaluation $PT Eval Moderate Complexity: 1 Mod PT Treatments $Therapeutic Exercise: 8-22 mins         Coyt Govoni H. Manson Passey, PT, DPT, NCS 07/01/18, 10:25 PM [phone]

## 2018-07-01 NOTE — Evaluation (Addendum)
Clinical/Bedside Swallow Evaluation Patient Details  Name: Destiny Mack MRN: [id number] Date of Birth: 28-Mar-1946  Today's Date: 07/01/2018 Time: SLP Start Time (ACUTE ONLY): 89 SLP Stop Time (ACUTE ONLY): 1150 SLP Time Calculation (min) (ACUTE ONLY): 60 min  Past Medical History:  Past Medical History:  Diagnosis Date  . A-fib ([place])   . Depression   . Frequent falls   . Hypertension   . Leaking of urine   . Mixed incontinence   . Stroke ([place])   . Tremor of hands and face    Past Surgical History:  Past Surgical History:  Procedure Laterality Date  . APPENDECTOMY    . TEE WITHOUT CARDIOVERSION N/A 01/04/2018   Procedure: TRANSESOPHAGEAL ECHOCARDIOGRAM (TEE);  Surgeon: Corey Skains, MD;  Location: [facility] ORS;  Service: Cardiovascular;  Laterality: N/A;  . TUBAL LIGATION     HPI:  Pt is a 72 y/o female with history of chronic atrial fibrillation on anticoagulation, chronic kidney disease stage III depression, hypertension, recent subacute stroke and frequent falls. She was receiving PT/OT per Daughter present.  Patient presented to the ED after she was found unresponsive at her residence.  She was intubated in the emergency room for airway protection and CT head did not show any acute intracranial abnormalities.  Patient was recently discharged from the hospital after she was treated for subacute stroke.  She was found in a very poor hygienic state and living condition at her residence when brought to the ED.  She was found to have UTI and met criteria for sepsis.  Patient received vancomycin and Zosyn and cefepime while in the ER.  After admission to the CCU, NSG note reported color and characteristics of the gastric fluid coming from OG tube which could increase risk for aspiration to have occurred.  Pt has been NPO since oral intubation at admission. She presents w/ upper airway congestion at rest in bed prior to BSE; weak cough to move secretions apparent.    Assessment / Plan /  Recommendation Clinical Impression  Pt appears to present w/ adequate oropharyngeal phase swallowing function w/ the trials given today w/ reduced risk for aspiration when following general aspiration precautions w/ the modified foods/liquids. Pt appears easily fatigued w/ any exertion; cues required - unsure of full Cognitive status. This presentation and slow Cognitive response w/ po intake can impact the oropharyngeal swallowing and increase risk for aspiration, choking. Pt supported upright in bed and given trials of ice chips, Nectar liquids via Spoon/Cup and purees w/ no immediate, overt s/s of aspiration noted w/ po trials; no decline in vocal quality or respiratory status from her baseline noted during/post trials. O2 sats remained in upper 90s, no decline in HR/RR. Pt exhibited adequate oral management and fairly timely A-P transfer w/ all trial consistencies. Verbal/tactile cues given. Adequate oral clearing was [facility] post trials. Pt required full feeding assistance d/t stiff, weak UEs. OM exam appeared grossly WFL; no anterior spillage or gross unilateral weakness appreciated. Recommend a more modified diet initially of Dysphagia level 1 (PUREE) w/ Nectar liquids; general aspiration precautions; Pills Crushed in Puree for safer, easier swallowing. ST services will f/u w/ toleration of diet and trials to upgrade diet next 1-2 days as pt's medical/Cognitive status' improve; NSG/family updated, agreed.  SLP Visit Diagnosis: Dysphagia, oropharyngeal phase (R13.12)(declined Cognitive status; weakness)    Aspiration Risk  Mild aspiration risk;Risk for inadequate nutrition/hydration    Diet Recommendation  Dysphagia level 1 (PUREE) w/ NECTAR consistency liquids; aspiration precautions;  feeding support and monitoring during meals  Medication Administration: Crushed with puree(for safer swallowing)    Other  Recommendations Recommended Consults: (Dietician f/u) Oral Care Recommendations: Oral care  BID;Staff/trained caregiver to provide oral care Other Recommendations: Order thickener from pharmacy;Prohibited food (jello, ice cream, thin soups);Remove water pitcher;Have oral suction available   Follow up Recommendations (TBD)      Frequency and Duration min 3x week  2 weeks       Prognosis Prognosis for Safe Diet Advancement: Fair Barriers to Reach Goals: Cognitive deficits;Severity of deficits      Swallow Study   General Date of Onset: 06/24/18 HPI: Pt is a 72 y/o female with history of chronic atrial fibrillation on anticoagulation, chronic kidney disease stage III depression, hypertension, recent subacute stroke and frequent falls. She was receiving PT/OT per Daughter present.  Patient presented to the ED after she was found unresponsive at her residence.  She was intubated in the emergency room for airway protection and CT head did not show any acute intracranial abnormalities.  Patient was recently discharged from the hospital after she was treated for subacute stroke.  She was found in a very poor hygienic state and living condition at her residence when brought to the ED.  She was found to have UTI and met criteria for sepsis.  Patient received vancomycin and Zosyn and cefepime while in the ER.  After admission to the CCU, NSG note reported color and characteristics of the gastric fluid coming from OG tube which could increase risk for aspiration to have occurred.  Pt has been NPO since oral intubation at admission. Type of Study: Bedside Swallow Evaluation Previous Swallow Assessment: none reported by Dtr Diet Prior to this Study: NPO(regular/soft diet at home d/t dentures) Temperature Spikes Noted: No(wbc 8.6) Respiratory Status: Nasal cannula(1-2 liters; weaning) History of Recent Intubation: Yes Length of Intubations (days): 6 days Date extubated: 06/30/18 Behavior/Cognition: Alert;Cooperative;Pleasant mood;Distractible;Requires cueing(mostly nonverbal) Oral Cavity  Assessment: Dry;Dried secretions Oral Care Completed by SLP: Yes Oral Cavity - Dentition: Edentulous(wears dentures (not in place)) Vision: Functional for self-feeding Self-Feeding Abilities: Needs assist;Needs set up;Total assist(weak/stiff  UEs) Patient Positioning: Upright in bed(needed pillow support) Baseline Vocal Quality: Low vocal intensity(few verbaliazations towards end of eval) Volitional Cough: Congested(Fair) Volitional Swallow: Able to elicit    Oral/Motor/Sensory Function Overall Oral Motor/Sensory Function: Within functional limits(grossly)   Ice Chips Ice chips: Within functional limits Presentation: Spoon(fed; 5 trials)   Thin Liquid Thin Liquid: Not tested    Nectar Thick Nectar Thick Liquid: Within functional limits Presentation: Cup;Spoon(assisted; 5 trials each)   Honey Thick Honey Thick Liquid: Not tested   Puree Puree: Within functional limits Presentation: Spoon(fed; 8 trials) Other Comments: min slower oral phase completion but adequate   Solid     Solid: Not tested Other Comments: weakness; not wearing dentures      Orinda Kenner, MS, CCC-SLP Destiny Mack 07/01/2018,2:57 PM

## 2018-07-01 NOTE — Progress Notes (Signed)
Plan for patient is for transfer to the floor today. Breath sounds improved, with dry cough. NP ordered incentive spirometry and flutter valve. Foley patent and draining adequate output. Incontinent of bowel this shift. Alert and verbalizes wants and needs. No concerns at this time. Continue to monitor.

## 2018-07-01 NOTE — Progress Notes (Signed)
   Name: Destiny Mack MRN: [id number] DOB: November 16, 1945     CONSULTATION DATE: 06/24/2018  Subjective & objective: Extubated yesterday 06/29/2018, improved mental status and remains on heparin drip for atrial fibrillation  PAST MEDICAL HISTORY :   has a past medical history of A-fib ([facility]), Depression, Frequent falls, Hypertension, Leaking of urine, Mixed incontinence, Stroke ([facility]), and Tremor of hands and face.  has a past surgical history that includes Tubal ligation; Appendectomy; and TEE without cardioversion (N/A, 01/04/2018). Prior to Admission medications   Medication Sig Start Date End Date Taking? Authorizing Provider  amLODipine (NORVASC) 5 MG tablet Take 5 mg by mouth daily.   Yes [provider]  apixaban (ELIQUIS) 5 MG TABS tablet Take 1 tablet (5 mg total) by mouth 2 (two) times daily. 12/15/17 06/24/18 Yes Pyreddy, Vivien Rota, MD  atorvastatin (LIPITOR) 20 MG tablet Take 20 mg by mouth at bedtime.    Yes [provider]   No Known Allergies  FAMILY HISTORY:  family history includes CAD in her father; Dementia in her mother; Heart failure in her father and mother. SOCIAL HISTORY:  reports that she has been smoking. She has a 28.00 pack-year smoking history. She has never used smokeless tobacco. She reports that she does not drink alcohol or use drugs.  REVIEW OF SYSTEMS:   Unable to obtain due to critical illness   VITAL SIGNS: Temp:  [98.2 F (36.8 C)-99.9 F (37.7 C)] 99.1 F (37.3 C) (11/04 1200) Pulse Rate:  [66-109] 76 (11/04 1200) Resp:  [14-23] 23 (11/04 1200) BP: (122-180)/(51-106) 140/59 (11/04 1200) SpO2:  [93 %-100 %] 95 % (11/04 1200)  Physical Examination: Vital signs: Please see the above listed vital signs Awake, slow to respond moving extremities and following simple commands On nasal cannula 2 L/min HEENT: Trachea midline, no oral lesions noted, no accessory muscle utilization, no distress, cardiovascular: Regular rate and rhythm    Pulmonary: Bilateral equal air entry with no adventitious sounds Abdominal exam: Positive bowel sounds soft Extremities: No clubbing cyanosis or edema noted  ASSESSMENT / PLAN: Acute respiratory failure intubated for airway protection with altered mental status.  Extubated on 06/29/2018 Doing well on nasal cannula  Altered mental status (improved) with toxic metabolic encephalopathy/hypertensive emergency.No acute intracranial abnormalities on the CT head,bilateral frontal lobe infarct left larger than the right stable multifocal paranasal sinus disease  Hypertensive emergency with altered mental status Optimize antihypertensives and monitor hemodynamics  chronic atrial fibrillation with controlled ventricular rate.Was on Eliquis. TEE 12/2017 LVEF 55 to 60% normal systolic function. Rate control and start on heparin for therapeutic anticoagulation while intubated  AKI(improved)on CKD stage III.  Will monitor closely  Anemia.  No evidence of active bleeding  Tora Kindred, DO   Patient ID: Destiny Mack, female   DOB: October 10, 1945, 72 y.o.   MRN: [id number]

## 2018-07-01 NOTE — Progress Notes (Signed)
Patient is being transferred to RM 215. Pt is AOx2, VSS, she does not show any signs of respiratory distress or c/o any pain. Report was called and given to Annabelle, RN and she verbalized understanding of the report given. The peri-care was performed, a brief was placed on patient and her gown was changed.

## 2018-07-01 NOTE — Progress Notes (Signed)
At 17:46 MD on call was notified via amion text paged that pt had 4 beats of V-tach per tele tech. Pt was not in distress.

## 2018-07-01 NOTE — Progress Notes (Signed)
OT Cancellation Note  Patient Details Name: Destiny Mack MRN: [id number] DOB: Sep 20, 1945   Cancelled Treatment:    Reason Eval/Treat Not Completed: Other (comment). Order received, chart reviewed. Pt working with PT upon attempt, lunch delivered as well. Will re-attempt OT evaluation at later date/time as pt is available and medically appropriate.   Richrd Prime, MPH, MS, OTR/L ascom [phone] 07/01/18, 12:43 PM

## 2018-07-01 NOTE — Progress Notes (Signed)
Pharmacy Electrolyte Monitoring Consult:  Pharmacy consulted to assist in monitoring and replacing electrolytes in this 72 y.o. female admitted on 06/24/2018 with sepsis. Patient is extubated.  Labs:  Sodium (mmol/L)  Date Value  07/01/2018 139   Potassium (mmol/L)  Date Value  07/01/2018 4.5   Magnesium (mg/dL)  Date Value  16/05/9603 2.0   Phosphorus (mg/dL)  Date Value  54/04/8118 3.2   Calcium (mg/dL)  Date Value  14/78/2956 8.8 (L)   Albumin (g/dL)  Date Value  21/30/8657 3.6     Assessment/Plan: Per ICU rounds will replace for goal potassium ~ 4, goal magnesium ~2, and goal corrected calcium > 8.5.   Electrolyte replacement not warranted at this time.  Will obtain follow up electrolytes with am labs.   Pharmacy will continue to monitor and adjust per consult.    Mauri Reading, PharmD Pharmacy Resident  07/01/2018 2:31 PM

## 2018-07-01 NOTE — Progress Notes (Addendum)
ANTICOAGULATION CONSULT NOTE  Pharmacy Consult for Apixaban Indication: atrial fibrillation  No Known Allergies  Patient Measurements: Height: 5\' 4"  (162.6 cm) Weight: 143 lb 8.3 oz (65.1 kg) IBW/kg (Calculated) : 54.7   Vital Signs: Temp: 99.5 F (37.5 C) (11/04 1300) Temp Source: Bladder (11/04 1100) BP: 146/58 (11/04 1300) Pulse Rate: 71 (11/04 1300)  Labs: Recent Labs    06/28/18 2257  06/29/18 0455 06/30/18 0429 07/01/18 0503  HGB  --    < > 8.4* 9.0* 9.4*  HCT  --   --  27.3* 29.3* 30.1*  PLT  --   --  161 190 190  APTT 90*  --  83*  --   --   HEPARINUNFRC 0.72*  --  0.61 0.38 0.24*  CREATININE  --   --  1.19* 1.23* 1.28*   < > = values in this interval not displayed.    Estimated Creatinine Clearance: 34.3 mL/min (A) (by C-G formula based on SCr of 1.28 mg/dL (H)).   Medical History: Past Medical History:  Diagnosis Date  . A-fib ([facility])   . Depression   . Frequent falls   . Hypertension   . Leaking of urine   . Mixed incontinence   . Stroke ([facility])   . Tremor of hands and face     Medications:  Infusions:  . sodium chloride Stopped (06/27/18 0448)  . lactated ringers 50 mL/hr at 07/01/18 0900    Assessment: 72 yo female admitted to ICU on 10/28 for sepsis. Patient required mechanical ventilation and has since been extubated. Transitioning from heparin drip to apixaban (PTA medication). Patient with past medical history significant for chronic Afib, CKD 3, and recent discharge for subacute stroke. CHA2DS2-VASc Score of at least 6 (Age > 75, Gender, HTN, Stroke).  Patient passed swallow test this AM with phase 1 dysphagia per nurse.    Plan:  Discontinued heparin and will initiate apixaban 5 mg BID this evening. Apixaban can be crushed - nurse is aware.  Pharmacy will continue to monitor.   Mauri Reading, PharmD Pharmacy Resident  07/01/2018 2:51 PM

## 2018-07-01 NOTE — Progress Notes (Signed)
Nutrition Follow-up  DOCUMENTATION CODES:   Non-severe (moderate) malnutrition in context of chronic illness  INTERVENTION:  Provide Hormel Shake (Vital Cuisine) po BID with lunch and dinner, each supplement provides 520 kcal and 22 grams of protein.  NUTRITION DIAGNOSIS:   Moderate Malnutrition related to chronic illness(hx CVA, inadequate oral intake) as evidenced by moderate fat depletion, moderate muscle depletion.  Ongoing.  GOAL:   Patient will meet greater than or equal to 90% of their needs  Progressing.  MONITOR:   PO intake, Supplement acceptance, Diet advancement, Labs, Weight trends, Skin, I & O's  REASON FOR ASSESSMENT:   Ventilator    ASSESSMENT:   72 year old female with PMHx of depression, frequent falls, hx CVA, CKD, HTN, A-fib who is admitted with AMS requiring intubation on 10/28 for airway protection, toxic metabolic encephalopathy, HTN emergency, atelectasis, PNA, UTI, sepsis, AKI on CKD III.   Patient was extubated on 11/2. Following SLP evaluation today diet was advanced to dysphagia 1 with nectar thick liquids. Patient is ready to eat. She likes potatoes and sweet potatoes.  Medications reviewed and include: famotidine, LR @ 50 mL/hr.  Labs reviewed: BUN 25, Creatinine 1.28.  Discussed with RN.  Diet Order:   Diet Order            DIET - DYS 1 Room service appropriate? Yes with Assist; Fluid consistency: Nectar Thick  Diet effective now              EDUCATION NEEDS:   Not appropriate for education at this time  Skin:  Skin Assessment: Skin Integrity Issues:(Stg II to sacrum)  Last BM:  07/01/2018 - medium type 5  Height:   Ht Readings from Last 1 Encounters:  06/24/18 5\' 4"  (1.626 m)    Weight:   Wt Readings from Last 1 Encounters:  06/29/18 65.1 kg    Ideal Body Weight:  54.5 kg  BMI:  Body mass index is 24.64 kg/m.  Estimated Nutritional Needs:   Kcal:  1345-1570 (MSJ x 1.2-1.4)  Protein:  80-95 grams (1.3-1.5  grams/kg)  Fluid:  1.5 L/day (25 mL/kg)  Helane Rima, MS, RD, LDN Office: [phone] Pager: [phone] After Hours/Weekend Pager: [phone]

## 2018-07-01 NOTE — Progress Notes (Signed)
[employer] - [place] at [facility]                                                                                                                                                                                  Patient Demographics   Destiny Mack, is a 72 y.o. female, DOB - 1945-09-03, ZOX:[id number]  Admit date - 06/24/2018   Admitting Physician Adrian Saran, MD  Outpatient Primary MD for the patient is Bender, Earl Lagos, MD   LOS - 7  Subjective: Patient doing better continues to have upper airway wheezing   Review of Systems:    CONSTITUTIONAL: No documented fever. No fatigue, weakness. No weight gain, no weight loss.  EYES: No blurry or double vision.  ENT: No tinnitus. No postnasal drip. No redness of the oropharynx.  RESPIRATORY: Upper airway wheezing no cough, no wheeze, no hemoptysis. No dyspnea.  CARDIOVASCULAR: No chest pain. No orthopnea. No palpitations. No syncope.  GASTROINTESTINAL: No nausea, no vomiting or diarrhea. No abdominal pain. No melena or hematochezia.  GENITOURINARY:  No urgency. No frequency. No dysuria. No hematuria. No obstructive symptoms. No discharge. No pain. No significant abnormal bleeding ENDOCRINE: No polyuria or nocturia. No heat or cold intolerance.  HEMATOLOGY: No anemia. No bruising. No bleeding. No purpura. No petechiae INTEGUMENTARY: No rashes. No lesions.  MUSCULOSKELETAL: No arthritis. No swelling. No gout.  NEUROLOGIC: No numbness, tingling, or ataxia. No seizure-type activity.  PSYCHIATRIC: No anxiety. No insomnia. No ADD.     Vitals:   Vitals:   07/01/18 0926 07/01/18 1000 07/01/18 1100 07/01/18 1200  BP: (!) 142/55 (!) 122/51 (!) 147/66 (!) 140/59  Pulse: 81 66 76 76  Resp: 17 14 18  (!) 23  Temp: 98.8 F (37.1 C) 99 F (37.2 C) 99 F (37.2 C) 99.1 F (37.3 C)  TempSrc: Bladder Bladder Bladder   SpO2: 96% 95% 98% 95%  Weight:      Height:        Wt Readings from Last 3 Encounters:  06/29/18 65.1 kg   05/20/18 59.2 kg  01/09/18 77.1 kg     Intake/Output Summary (Last 24 hours) at 07/01/2018 1301 Last data filed at 07/01/2018 0900 Gross per 24 hour  Intake 2083.93 ml  Output 2025 ml  Net 58.93 ml    Physical Exam:   GENERAL: Critically ill on the vent HEAD, EYES, EARS, NOSE AND THROAT: Atraumatic, normocephalic. Extraocular muscles are intact. Pupils equal and reactive to light. Sclerae anicteric. No conjunctival injection. No oro-pharyngeal erythema.  NECK: Supple. There is no jugular venous distention. No bruits, no lymphadenopathy, no thyromegaly.  HEART: Regular rate and rhythm,. No murmurs, no rubs, no clicks.  LUNGS: Upper airway wheezing y. No rales or rhonchi. No wheezes.  ABDOMEN: Soft, flat, nontender, nondistended. Has good bowel sounds. No hepatosplenomegaly appreciated.  EXTREMITIES: No evidence of any cyanosis, clubbing, or peripheral edema.  +2 pedal and radial pulses bilaterally.  NEUROLOGIC: Sedated on the vent SKIN: Moist and warm with no rashes appreciated.  Psych: Not anxious, depressed LN: No inguinal LN enlargement    Antibiotics   Anti-infectives (From admission, onward)   Start     Dose/Rate Route Frequency Ordered Stop   06/29/18 1400  ceFAZolin (ANCEF) IVPB 1 g/50 mL premix  Status:  Discontinued     1 g 100 mL/hr over 30 Minutes Intravenous Every 8 hours 06/29/18 0819 07/01/18 1123   06/28/18 1700  ceFAZolin (ANCEF) IVPB 1 g/50 mL premix  Status:  Discontinued     1 g 100 mL/hr over 30 Minutes Intravenous Every 12 hours 06/28/18 1135 06/29/18 0819   06/27/18 2200  ceFAZolin (ANCEF) IVPB 1 g/50 mL premix  Status:  Discontinued     1 g 100 mL/hr over 30 Minutes Intravenous Every 8 hours 06/27/18 1642 06/28/18 1135   06/25/18 1200  vancomycin (VANCOCIN) IVPB 1000 mg/200 mL premix  Status:  Discontinued     1,000 mg 200 mL/hr over 60 Minutes Intravenous Every 36 hours 06/24/18 1535 06/25/18 1551   06/24/18 2200  piperacillin-tazobactam (ZOSYN) IVPB  3.375 g  Status:  Discontinued     3.375 g 12.5 mL/hr over 240 Minutes Intravenous Every 8 hours 06/24/18 1535 06/24/18 1635   06/24/18 2200  ceFEPIme (MAXIPIME) 1 g in sodium chloride 0.9 % 100 mL IVPB  Status:  Discontinued     1 g 200 mL/hr over 30 Minutes Intravenous Every 24 hours 06/24/18 1637 06/27/18 1642   06/24/18 1535  vancomycin variable dose per unstable renal function (pharmacist dosing)  Status:  Discontinued      Does not apply See admin instructions 06/24/18 1535 06/25/18 1039   06/24/18 1445  vancomycin (VANCOCIN) IVPB 1000 mg/200 mL premix     1,000 mg 200 mL/hr over 60 Minutes Intravenous  Once 06/24/18 1441 06/24/18 1653   06/24/18 1445  piperacillin-tazobactam (ZOSYN) IVPB 3.375 g     3.375 g 100 mL/hr over 30 Minutes Intravenous  Once 06/24/18 1441 06/24/18 1545      Medications   Scheduled Meds: . amLODipine  10 mg Per Tube Daily  . atorvastatin  20 mg Per Tube QHS  . budesonide (PULMICORT) nebulizer solution  0.25 mg Nebulization BID  . famotidine  20 mg Per Tube QHS  . ipratropium-albuterol  3 mL Nebulization Q6H  . mouth rinse  15 mL Mouth Rinse BID  . sodium chloride flush  10-40 mL Intracatheter Q12H   Continuous Infusions: . sodium chloride Stopped (06/27/18 0448)  . heparin 850 Units/hr (07/01/18 1049)  . lactated ringers 50 mL/hr at 07/01/18 0900   PRN Meds:.sodium chloride, acetaminophen **OR** acetaminophen, labetalol, senna-docusate, sodium chloride flush   Data Review:   Micro Results Recent Results (from the past 240 hour(s))  Culture, blood (Routine x 2)     Status: None   Collection Time: 06/24/18  1:20 PM  Result Value Ref Range Status   Specimen Description BLOOD BLOOD LEFT ARM  Final   Special Requests   Final    BOTTLES DRAWN AEROBIC AND ANAEROBIC Blood Culture adequate volume   Culture   Final    NO GROWTH 5 DAYS Performed at [facility], [address] [place],  [place] [address]    Report Status 06/29/2018  FINAL  Final  Culture, blood (Routine x 2)     Status: None   Collection Time: 06/24/18  1:20 PM  Result Value Ref Range Status   Specimen Description BLOOD BLOOD RIGHT HAND  Final   Special Requests   Final    BOTTLES DRAWN AEROBIC AND ANAEROBIC Blood Culture results may not be optimal due to an excessive volume of blood received in culture bottles   Culture   Final    NO GROWTH 5 DAYS Performed at [facility], [address] [place], [place] [address]    Report Status 06/29/2018 FINAL  Final  Urine Culture     Status: Abnormal   Collection Time: 06/24/18  1:54 PM  Result Value Ref Range Status   Specimen Description   Final    URINE, RANDOM Performed at [facility], [address], [place], [place] [address]    Special Requests   Final    Normal Performed at [facility], [address] [place], [place] [address]    Culture >=100,000 COLONIES/mL ESCHERICHIA COLI (A)  Final   Report Status 06/27/2018 FINAL  Final   Organism ID, Bacteria ESCHERICHIA COLI (A)  Final      Susceptibility   Escherichia coli - MIC*    AMPICILLIN >=32 RESISTANT Resistant     CEFAZOLIN <=4 SENSITIVE Sensitive     CEFTRIAXONE <=1 SENSITIVE Sensitive     CIPROFLOXACIN >=4 RESISTANT Resistant     GENTAMICIN <=1 SENSITIVE Sensitive     IMIPENEM <=0.25 SENSITIVE Sensitive     NITROFURANTOIN <=16 SENSITIVE Sensitive     TRIMETH/SULFA <=20 SENSITIVE Sensitive     AMPICILLIN/SULBACTAM 16 INTERMEDIATE Intermediate     PIP/TAZO <=4 SENSITIVE Sensitive     Extended ESBL NEGATIVE Sensitive     * >=100,000 COLONIES/mL ESCHERICHIA COLI  MRSA PCR Screening     Status: None   Collection Time: 06/25/18 10:56 AM  Result Value Ref Range Status   MRSA by PCR NEGATIVE NEGATIVE Final    Comment:        The GeneXpert MRSA Assay (FDA approved for NASAL specimens only), is one component of a comprehensive MRSA colonization surveillance program. It is not intended to diagnose  MRSA infection nor to guide or monitor treatment for MRSA infections. Performed at [facility], [address], [place], [place] [address]     Radiology Reports Dg Abd 1 View  Result Date: 06/24/2018 CLINICAL DATA:  72 year old female. Orogastric tube placement. Initial encounter. EXAM: ABDOMEN - 1 VIEW COMPARISON:  06/24/2018 chest x-ray. 10/29/2017 abdominal plain film exam. FINDINGS: Nasogastric tube tip gastric antrum level with side hole gastric body level. Radiopaque structure left upper quadrant unchanged from prior exam of questionable etiology. No free air detected on this erect view. IMPRESSION: Nasogastric tube tip gastric antrum level with side hole gastric body level. Electronically Signed   By: Lacy Duverney M.D.   On: 06/24/2018 17:41   Ct Head Wo Contrast  Result Date: 06/24/2018 CLINICAL DATA:  Altered mental status with questionable seizure EXAM: CT HEAD WITHOUT CONTRAST TECHNIQUE: Contiguous axial images were obtained from the base of the skull through the vertex without intravenous contrast. COMPARISON:  Head CT May 20, 2018 and brain MRI May 21, 2018 FINDINGS: Brain: Moderate diffuse atrophy is stable. There is no intracranial mass, hemorrhage, extra-axial fluid collection, or midline shift. There is evidence of a prior infarct in the anterior left frontal  lobe, midportion, stable. There is extensive small vessel disease throughout the centra semiovale bilaterally. There is evidence of a prior infarct at the right gray-white compartment junction of the posterior right frontal lobe. No acute infarct is demonstrable. Vascular: There is no appreciable hyperdense vessel. There is calcification in each carotid siphon region. Skull: The bony calvarium appears intact. Sinuses/Orbits: There is a retention cyst in the inferolateral left maxillary antrum. There is slight mucosal thickening in the right posterior maxillary sinus. There is extensive opacification in  multiple ethmoid air cells. There is opacification in the posterior sphenoid sinus regions. Orbits appear symmetric bilaterally. Other: Mastoid air cells are clear. IMPRESSION: 1. Stable atrophy with supratentorial small vessel disease. Prior frontal lobe region infarcts bilaterally, larger on the left than on the right, stable. No acute infarct. No mass or hemorrhage. 2.  There are foci of arterial vascular calcification. 3.  There is multifocal paranasal sinus disease. Electronically Signed   By: Bretta Bang III M.D.   On: 06/24/2018 15:10   Dg Chest Port 1 View  Result Date: 06/30/2018 CLINICAL DATA:  Pneumonia. EXAM: PORTABLE CHEST 1 VIEW COMPARISON:  06/2018 FINDINGS: The patient has been extubated. Endotracheal tube has been removed. Right PICC line in stable position. Cardiomediastinal silhouette is normal. Mediastinal contours appear intact. Calcific atherosclerotic disease of the aorta and tortuosity. There is no evidence of pleural effusion or pneumothorax. Minimal peribronchial thickening in the left lower lobe. Osseous structures are without acute abnormality. Soft tissues are grossly normal. IMPRESSION: Minimal peribronchial thickening in the left lower lobe may represent atelectasis or airspace consolidation. Electronically Signed   By: Ted Mcalpine M.D.   On: 06/30/2018 07:09   Dg Chest Port 1 View  Result Date: 06/29/2018 CLINICAL DATA:  Pneumonia. EXAM: PORTABLE CHEST 1 VIEW COMPARISON:  One-view chest x-ray 06/27/2018 FINDINGS: Heart size is normal. Endotracheal tube terminates 2.5 cm above the carina. NG tube courses off the inferior border of the film. Right-sided PICC line is stable. Left greater than right basilar airspace disease is present. This likely reflects atelectasis. No other significant airspace disease is present. IMPRESSION: 1. Support apparatus is stable. 2. Left greater than right basilar airspace disease, likely atelectasis. Electronically Signed   By:  Marin Roberts M.D.   On: 06/29/2018 07:22   Dg Chest Port 1 View  Result Date: 06/27/2018 CLINICAL DATA:  Intubated patient.  Follow-up study. EXAM: PORTABLE CHEST 1 VIEW COMPARISON:  06/26/2018 FINDINGS: Endotracheal tube tip projects 2 cm above the Carina. Nasal/orogastric tube passes below the diaphragm into the stomach. New right PICC line tip projects just above the caval atrial junction. Left pleural effusion noted on the previous day's study is not well-defined. Are prominent bronchovascular markings. Lungs otherwise clear with no evidence of pulmonary edema. No pneumothorax. IMPRESSION: 1. Support apparatus is well positioned. 2. Small left pleural effusion defined on the current AP portable chest radiograph. No acute findings in the lungs. No pneumothorax. Electronically Signed   By: Amie Portland M.D.   On: 06/27/2018 06:44   Dg Chest Port 1 View  Result Date: 06/26/2018 CLINICAL DATA:  Acute onset of respiratory failure. EXAM: PORTABLE CHEST 1 VIEW COMPARISON:  Chest radiograph performed 06/24/2018 FINDINGS: The patient's endotracheal tube is seen ending 1-2 cm above the carina. This could be retracted 1-2 cm. A small left pleural effusion is noted. No pneumothorax is seen. The cardiomediastinal silhouette is normal in size. No acute osseous abnormalities are identified. The patient's enteric tube is noted extending below  the carina. IMPRESSION: 1. Endotracheal tube seen ending 1-2 cm above the carina. This could be retracted 1-2 cm. 2. Small left pleural effusion noted. Lungs otherwise grossly clear. Electronically Signed   By: Roanna Raider M.D.   On: 06/26/2018 09:36   Dg Chest Portable 1 View  Result Date: 06/24/2018 CLINICAL DATA:  To mental status x2 hours. EXAM: PORTABLE CHEST 1 VIEW COMPARISON:  None. FINDINGS: Endotracheal tube tip terminates 4 cm above the carina in satisfactory position. Gastric tube with side port projects below the left hemidiaphragm in the expected  location of the stomach. The tip is excluded on this study however. The heart size and mediastinal contours are within normal limits. Mild pulmonary hyperinflation. Minimal aortic atherosclerosis similar to prior. No alveolar consolidation. Remote right-sided rib fractures involving the sixth and seventh ribs. IMPRESSION: 1. Mild pulmonary hyperinflation. 2. Satisfactory support line and tube positions. 3. Minimal aortic atherosclerosis. Electronically Signed   By: Tollie Eth M.D.   On: 06/24/2018 14:07   [place] Ekg Site Rite  Result Date: 06/27/2018 If Site Rite image not attached, placement could not be confirmed due to current cardiac rhythm.  [place] Ekg Site Rite  Result Date: 06/27/2018 If Site Rite image not attached, placement could not be confirmed due to current cardiac rhythm.    CBC Recent Labs  Lab 06/27/18 0427  06/27/18 1831 06/28/18 0334 06/29/18 0455 06/30/18 0429 07/01/18 0503  WBC 7.9  --   --  10.5 9.6 9.5 8.6  HGB 7.0*   < > 8.4* 8.7* 8.4* 9.0* 9.4*  HCT 23.3*   < > 26.9* 28.7* 27.3* 29.3* 30.1*  PLT 125*  --   --  171 161 190 190  MCV 99.1  --   --  99.3 99.3 98.7 98.4  MCH 29.8  --   --  30.1 30.5 30.3 30.7  MCHC 30.0  --   --  30.3 30.8 30.7 31.2  RDW 15.5  --   --  15.6* 15.3 14.9 14.5  LYMPHSABS 1.1  --   --  1.2 1.3 1.1 1.0  MONOABS 0.8  --   --  1.0 0.9 0.8 0.8  EOSABS 0.2  --   --  0.4 0.4 0.5 0.4  BASOSABS 0.1  --   --  0.1 0.1 0.1 0.1   < > = values in this interval not displayed.    Chemistries  Recent Labs  Lab 06/24/18 2250  06/26/18 0122 06/27/18 0427 06/28/18 0334 06/29/18 0455 06/30/18 0429 07/01/18 0503  NA 144   < > 139 135 145 142 142 139  K 5.0   < > 3.5 3.6 4.5 4.2 3.8 4.5  CL 116*   < > 109 110 116* 112* 109 105  CO2 18*   < > 20* 20* 20* 24 23 23   GLUCOSE 120*   < > 123* 90 99 112* 76 75  BUN 36*   < > 33* 31* 36* 36* 31* 25*  CREATININE 2.03*   < > 1.52* 1.23* 1.32* 1.19* 1.23* 1.28*  CALCIUM 8.9   < > 7.9* 7.3* 8.9 8.6* 8.9  8.8*  MG  --   --  2.0 2.1 2.4 2.3  --  2.0  AST 18  --   --   --   --   --   --   --   ALT 11  --   --   --   --   --   --   --  ALKPHOS 97  --   --   --   --   --   --   --   BILITOT 0.6  --   --   --   --   --   --   --    < > = values in this interval not displayed.   ------------------------------------------------------------------------------------------------------------------ estimated creatinine clearance is 34.3 mL/min (A) (by C-G formula based on SCr of 1.28 mg/dL (H)). ------------------------------------------------------------------------------------------------------------------ No results for input(s): HGBA1C in the last 72 hours. ------------------------------------------------------------------------------------------------------------------ No results for input(s): CHOL, HDL, LDLCALC, TRIG, CHOLHDL, LDLDIRECT in the last 72 hours. ------------------------------------------------------------------------------------------------------------------ No results for input(s): TSH, T4TOTAL, T3FREE, THYROIDAB in the last 72 hours.  Invalid input(s): FREET3 ------------------------------------------------------------------------------------------------------------------ No results for input(s): VITAMINB12, FOLATE, FERRITIN, TIBC, IRON, RETICCTPCT in the last 72 hours.  Coagulation profile Recent Labs  Lab 06/24/18 1942  INR 1.44    No results for input(s): DDIMER in the last 72 hours.  Cardiac Enzymes Recent Labs  Lab 06/26/18 0709 06/26/18 1338 06/26/18 1920  TROPONINI 0.03* 0.03* 0.03*   ------------------------------------------------------------------------------------------------------------------ Invalid input(s): POCBNP    Assessment & Plan   72 year old female with history of chronic atrial fibrillation and recent discharge from the hospital at the end of September with subacute CVA who presented to the ER due to unresponsiveness.  *Acute hypoxic  respiratory failure: Status post extubation- Continue to monitor respiratory status  * Acute metabolic encephalopathy-Neg CT head, -Improved  * Sepsis: present on admission due to urinary tract infection Continue IV fluids, Ancef Afebrile  * Atelectasis and LLL pneumonia: Ancef  * E Coli UTI -Cefazolin   * chronic atrial fibrillation with controlled ventricular rate.Was on Eliquis. TEE 12/2017 LVEF 55 to 60% normal systolic function -Rate control and on heparin can switch over to Eliquis  * Acute on CKD stage III - due to sepsis: - improving with hydration  *Recent CVA: Continue Eliquis and statin once extubated  Prognosis poor      Code Status Orders  (From admission, onward)         Start     Ordered   06/24/18 1648  Full code  Continuous     06/24/18 1648        Code Status History    Date Active Date Inactive Code Status Order ID Comments User Context   05/20/2018 1847 05/21/2018 2212 Full Code [id number]  Ramonita Lab, MD Inpatient   05/20/2018 1847 05/20/2018 1847 Full Code [id number]  Ramonita Lab, MD Inpatient   12/31/2017 1538 01/04/2018 2130 DNR [id number]  Milagros Loll, MD ED   12/13/2017 1630 12/15/2017 2016 Full Code [id number]  Alford Highland, MD ED   11/22/2017 0202 11/23/2017 2009 DNR [id number]  Salary, Evelena Asa, MD ED   11/22/2017 0150 11/22/2017 0202 Full Code [id number]  Salary, Evelena Asa, MD ED   11/01/2017 1026 11/02/2017 0155 DNR [id number]  Delfino Lovett, MD Inpatient   10/29/2017 1256 11/01/2017 1026 Full Code [id number]  Alford Highland, MD ED           Consults intensive  DVT Prophylaxis heparin Lab Results  Component Value Date   PLT 190 07/01/2018     Time Spent in minutes 35-minute greater than 50% of time spent in care coordination and counseling patient regarding the condition and plan of care.   Auburn Bilberry M.D on 07/01/2018 at 1:01 PM  Between 7am to 6pm - Pager - [phone]  After 6pm go to [email or web address] - password  [facility]  [employer]  Office  [phone]

## 2018-07-01 NOTE — Progress Notes (Signed)
Pharmacy Antibiotic Note  Destiny Mack is a 72 y.o. female admitted on 06/24/2018 with sepsis. Patient was hospitalized in September with subacute stroke. Patient has history significant for a.fib and CKD stage III. Patient transferred to ICU from ED requiring mechanical ventilation, still currently requiring. Pharmacy has been consulted for cefazolin dosing.  Plan: Discontinued Cefazolin as it is day 5 of therapy, but day 7 of total antibiotic therapy.   Height: 5\' 4"  (162.6 cm) Weight: 143 lb 8.3 oz (65.1 kg) IBW/kg (Calculated) : 54.7  Temp (24hrs), Avg:98.9 F (37.2 C), Min:98.2 F (36.8 C), Max:99.7 F (37.6 C)  Recent Labs  Lab 06/24/18 1942  06/27/18 0427 06/28/18 0334 06/29/18 0455 06/30/18 0429 07/01/18 0503  WBC  --    < > 7.9 10.5 9.6 9.5 8.6  CREATININE  --    < > 1.23* 1.32* 1.19* 1.23* 1.28*  LATICACIDVEN 1.4  --   --   --   --   --   --    < > = values in this interval not displayed.    Estimated Creatinine Clearance: 34.3 mL/min (A) (by C-G formula based on SCr of 1.28 mg/dL (H)).    No Known Allergies  Antimicrobials this admission: Zosyn 10/28 x 1 Vancomycin 10/28 >> 10/29 Cefepime 10/28 >> 10/31 Cefazolin 10/31>> 11/4  Dose adjustments this admission: 11/1 Decreased Cefazolin IV from 1 g q8h to 1 g q12h. 11/2 increased cefazolin to 1g IV every 8 hours     Microbiology results: 10/28 BCx: no growth x 5 days  10/28 UCx: 100,000 CFU E.coli resistant to ampicillin, ciprofloxacin, and unasyn 10/28 MRSA PCR: negative   Thank you for allowing pharmacy to be a part of this patient's care.   Mauri Reading, PharmD Pharmacy Resident  07/01/2018 2:34 PM

## 2018-07-01 NOTE — Progress Notes (Signed)
ANTICOAGULATION CONSULT NOTE  Pharmacy Consult for heparin Indication: atrial fibrillation  No Known Allergies  Patient Measurements: Height: 5\' 4"  (162.6 cm) Weight: 143 lb 8.3 oz (65.1 kg) IBW/kg (Calculated) : 54.7 Heparin Dosing Weight: 63.8 kg  Vital Signs: Temp: 99.1 F (37.3 C) (11/04 0600) BP: 156/76 (11/04 0600) Pulse Rate: 89 (11/04 0600)  Labs: Recent Labs    06/28/18 1248  06/28/18 2257  06/29/18 0455 06/30/18 0429 07/01/18 0503  HGB  --   --   --    < > 8.4* 9.0* 9.4*  HCT  --   --   --   --  27.3* 29.3* 30.1*  PLT  --   --   --   --  161 190 190  APTT 65*  --  90*  --  83*  --   --   HEPARINUNFRC  --    < > 0.72*  --  0.61 0.38 0.24*  CREATININE  --   --   --   --  1.19* 1.23* 1.28*   < > = values in this interval not displayed.    Estimated Creatinine Clearance: 34.3 mL/min (A) (by C-G formula based on SCr of 1.28 mg/dL (H)).   Medical History: Past Medical History:  Diagnosis Date  . A-fib (HCC)   . Depression   . Frequent falls   . Hypertension   . Leaking of urine   . Mixed incontinence   . Stroke (HCC)   . Tremor of hands and face     Medications:  Infusions:  . sodium chloride Stopped (06/27/18 0448)  .  ceFAZolin (ANCEF) IV 1 g (07/01/18 0545)  . heparin 700 Units/hr (07/01/18 0500)  . lactated ringers 50 mL/hr at 07/01/18 0546    Assessment: Pharmacy consulted for heparin drip management for 72 yo female admitted to ICU on 10/28 for sepsis. Patient requiring mechanical ventilation and was transitioned to heparin drip from apixaban (PTA medication). Patient did not receive apixaban inpatient. Patient with past medical history significant for chronic Afib, CKD 3, and recent discharge for subacute stroke. CHA2DS2-VASc Score of at least 6 (Age > 75, Gender, HTN, Stroke).    Goal of Therapy:  Heparin level 0.3-0.7 units/ml  APTT: 66-102 s Monitor platelets by anticoagulation protocol: Yes   Plan:  Increase heparin infusion from  650 units/hr to 700 units/hr based on subtherapeutic aPTT of 65 @ 1248. Will check aPTT/HL in 7 hours as patient is > 70 years.  11/1 PM heparin level 0.72, aPTT 90. Continue current regimen and recheck with AM labs.  11/2 AM heparin level 0.61, aPTT 83. Now correlating in therapeutic range. Will monitor and adjust by heparin level only. Recheck heparin level and CBC with tomorrow AM labs.  11/3 AM heparin level 0.38. Continue current regimen. Recheck heparin level and CBC with tomorrow AM labs.  11/4 AM heparin level 0.24. 1000 unit bolus and increase rate to 850 units/hr. Recheck in 8 hours.  Pharmacy will continue to monitor.  Fulton Reek, PharmD, BCPS  07/01/18 6:23 AM

## 2018-07-02 ENCOUNTER — Inpatient Hospital Stay: Payer: Medicare HMO

## 2018-07-02 LAB — BASIC METABOLIC PANEL
Anion gap: 5 (ref 5–15)
BUN: 21 mg/dL (ref 8–23)
CALCIUM: 8.9 mg/dL (ref 8.9–10.3)
CO2: 27 mmol/L (ref 22–32)
CREATININE: 1.2 mg/dL — AB (ref 0.44–1.00)
Chloride: 111 mmol/L (ref 98–111)
GFR, EST AFRICAN AMERICAN: 51 mL/min — AB (ref >60)
GFR, EST NON AFRICAN AMERICAN: 44 mL/min — AB (ref >60)
GLUCOSE: 101 mg/dL — AB (ref 70–99)
POTASSIUM: 4 mmol/L (ref 3.5–5.1)
Sodium: 143 mmol/L (ref 135–145)

## 2018-07-02 LAB — MAGNESIUM: MAGNESIUM: 2 mg/dL (ref 1.7–2.4)

## 2018-07-02 MED ORDER — METHYLPREDNISOLONE SODIUM SUCC 125 MG IJ SOLR
60.0000 mg | Freq: Two times a day (BID) | INTRAMUSCULAR | Status: DC
  Administered 2018-07-02 – 2018-07-03 (×3): 60 mg via INTRAVENOUS
  Filled 2018-07-02 (×3): qty 2

## 2018-07-02 MED ORDER — GUAIFENESIN ER 600 MG PO TB12
600.0000 mg | ORAL_TABLET | Freq: Two times a day (BID) | ORAL | Status: DC
  Administered 2018-07-02 – 2018-07-03 (×3): 600 mg via ORAL
  Filled 2018-07-02 (×3): qty 1

## 2018-07-02 NOTE — Progress Notes (Signed)
Pharmacy Electrolyte Monitoring Consult:  Pharmacy consulted to assist in monitoring and replacing electrolytes in this 72 y.o. female admitted on 06/24/2018 with sepsis. Patient is extubated.  Labs:  Sodium (mmol/L)  Date Value  07/02/2018 143   Potassium (mmol/L)  Date Value  07/02/2018 4.0   Magnesium (mg/dL)  Date Value  16/05/9603 2.0   Phosphorus (mg/dL)  Date Value  54/04/8118 3.2   Calcium (mg/dL)  Date Value  14/78/2956 8.9   Albumin (g/dL)  Date Value  21/30/8657 3.6     Assessment/Plan: Per ICU rounds will replace for goal potassium ~ 4, goal magnesium ~2, and goal corrected calcium > 8.5.   Electrolyte replacement not warranted at this time.  Since this consult was initiated in CCU, pharmacy will sign off for now   Lowella Bandy, PharmD 07/02/2018 10:10 AM

## 2018-07-02 NOTE — Clinical Social Work Note (Signed)
Clinical Social Work Assessment  Patient Details  Name: Destiny Mack MRN: [id number] Date of Birth: July 02, 1946  Date of referral:  07/02/18               Reason for consult:  Housing Concerns/Homelessness, Abuse/Neglect                Permission sought to share information with:  Family Supports Permission granted to share information::  Yes, Verbal Permission Granted  Name::        Agency::     Relationship::     Contact Information:     Housing/Transportation Living arrangements for the past 2 months:  Single Family Home Source of Information:  Patient, Adult Children Patient Interpreter Needed:  None Criminal Activity/Legal Involvement Pertinent to Current Situation/Hospitalization:  No - Comment as needed Significant Relationships:  Adult Children Lives with:  Adult Children Do you feel safe going back to the place where you live?  Yes Need for family participation in patient care:  Yes (Comment)  Care giving concerns:  Patient resides at home with her daughter: Destiny Mack: [id number] and her grandson.   Social Worker assessment / plan:  CSW met with patient and daughter, Destiny Mack, this afternoon. PT has recommended short term rehab. CSW met with patient and daughter at bedside and introduced self and purpose of visit. Patient's daughter states that her mother has been to rehab previously. She stated that she went to [facility] and they allegedly allowed her to fall several times, once resulting in a breaking a hip. Patient's daughter stated she would only consider [facility] because a family member works there. When patient was asked if she would consider short term rehab again, patient verbalized that she will not consider rehab and she will only go home. CSW discussed with patient and daughter the concerns raised by DSS Adult Protective Services and the daughter instantly became angry with a raised voice. She denied that her mother was not being taken care of. She denied  that there are bed bugs in the home and said her mother was found in ICU to have no bug bites. Patient's daughter states she will take her mother home and that she and her son will continue to provide care for her. She stated [facility] was coming out to the home and providing home health services and she would like that to continue. CSW updated Randi at [facility] this afternoon regarding patient's and daughter's wishes. Randi asked that we notify DSS when patient is discharged. Randi stated that patient will have another caseworker: Adelfa Koh: [phone]. She also stated that they were in the process of working on potentially obtaining guardianship of patient. Lala Lund is aware patient will return home at discharge. RN CM has been notified.  Employment status:    Insurance information:    PT Recommendations:  Cook / Referral to community resources:     Patient/Family's Response to care:  Patient and daughter expressed appreciation for CSW assistance.  Patient/Family's Understanding of and Emotional Response to Diagnosis, Current Treatment, and Prognosis:  Patient's daughter is very frustrated with DSS APS and patient is insistent she return home.   Emotional Assessment Appearance:  Appears stated age Attitude/Demeanor/Rapport:  (pleasant and cooperative) Affect (typically observed):  (patient calm; daughter angry at mention of DSS following case) Orientation:  Oriented to Self, Oriented to Place, Oriented to Situation Alcohol / Substance use:  Not Applicable Psych involvement (Current and /or in the community):  No (Comment)  Discharge  Needs  Concerns to be addressed:  Care Coordination Readmission within the last 30 days:  No Current discharge risk:  None Barriers to Discharge:  No Barriers Identified   Shela Leff, LCSW 07/02/2018, 2:50 PM

## 2018-07-02 NOTE — Evaluation (Signed)
Occupational Therapy Evaluation Patient Details Name: Destiny Mack MRN: [id number] DOB: 04/28/1946 Today's Date: 07/02/2018    History of Present Illness 72yo female pt presented to ER after being found non-responsive, noted with hypoxia; admitted with sepsis related to UTI, requiring intubation 10/28-11/2 for respiratory support.   Clinical Impression   Pt seen for OT evaluation this date. Prior to hospital admission, pt was primarily at bed or wheelchair level for mobility, requiring significant assist from son for w/c transfers, per chart review. Pt's daughter assists with ADL/IADL. Pt oriented to self and no family in room to confirm PLOF (pt reporting her dog Lawanna Kobus assists with her dressing). Pt lives with her daughter in a 2 story apartment with level entry. Per pt, daughter assists with sponge bathing and "hygiene," and uses depends and a bed pan for toileting needs. 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 Pt required max A +2 for bed mobility. BUE AAROM ther-ex with pain noted in shoulders and significant assist from therapist to perform, proximally>distally. Pt would benefit from skilled OT to address noted impairments and functional limitations (see below for any additional details) in order to maximize safety and independence while minimizing falls risk and caregiver burden.  Upon hospital discharge, recommend pt discharge to STR.     Follow Up Recommendations  SNF    Equipment Recommendations  Other (comment)(TBD, may benefit from adaptive utensils for self feeding)    Recommendations for Other Services       Precautions / Restrictions Precautions Precautions: Fall Precaution Comments: Nectar  liquids Restrictions Weight Bearing Restrictions: No      Mobility Bed Mobility               General bed mobility comments: +2 max assist to scoot up in bed   Transfers                 General transfer comment: deferred 2/2 imaging in room to see pt    Balance                                           ADL either performed or assessed with clinical judgement   ADL Overall ADL's : Needs assistance/impaired Eating/Feeding: Bed level;Minimal assistance;Moderate assistance Eating/Feeding Details (indicate cue type and reason): min-mod assist hand under hand for scooping magic cup and bringing to mouth, cues to initiate movement Grooming: Bed level;Moderate assistance   Upper Body Bathing: Bed level;Maximal assistance   Lower Body Bathing: Bed level;Maximal assistance   Upper Body Dressing : Bed level;Maximal assistance   Lower Body Dressing: Bed level;Maximal assistance     Toilet Transfer Details (indicate cue type and reason): unable Toileting- Clothing Manipulation and Hygiene: Total assistance Toileting - Clothing Manipulation Details (indicate cue type and reason): uses bed pan and depends at baseline for toileting             Vision Baseline Vision/History: Wears glasses Wears Glasses: At all times(no glasses in room) Patient Visual Report: No change from baseline       Perception     Praxis      Pertinent Vitals/Pain Pain Assessment: Faces Faces Pain Scale: Hurts a little bit  Pain Location: with R shoulder ROM  Pain Intervention(s): Limited activity within patient's tolerance;Monitored during session;Repositioned     Hand Dominance Right   Extremity/Trunk Assessment Upper Extremity Assessment Upper Extremity Assessment: Generalized weakness(R UE grossly 2-/5; L UE grossly 3-/5.  Difficulty coordinating purposeful movement bilat)   Lower Extremity Assessment Lower Extremity Assessment: Generalized weakness(R LE  grossly 2-/5, L LE grossly 3+/5)       Communication Communication Communication: No difficulties   Cognition Arousal/Alertness: Lethargic Behavior During Therapy: WFL for tasks assessed/performed Overall Cognitive Status: No family/caregiver present to determine baseline cognitive functioning                                 General Comments: Pt oriented to self, follows simple commands inconsistently, inconsistent attention to task and to therapist interactions, increased processing time    General Comments       Exercises Other Exercises Other Exercises: pt instructed in supine therex BUE wrist/elbow/shoulder flex/ext AAROM; 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   Shoulder Instructions      Home Living Family/patient expects to be discharged to:: Private residence Living Arrangements: Children Available Help at Discharge: Family;Available 24 hours/day Type of Home: Apartment Home Access: Level entry     Home Layout: Two level;Able to live on main level with bedroom/bathroom Alternate Level Stairs-Number of Steps: 14 steps w/ R handrail from 1st to 2nd floor where bathroom w/ tub/shower is Alternate Level Stairs-Rails: Right Bathroom Shower/Tub: Chief Strategy Officer: Standard     Home Equipment: Bedside commode;Wheelchair - manual;Hospital bed   Additional Comments: No family present to confirm information provided by pt, pt is a questionable historian      Prior Functioning/Environment Level of Independence: Needs assistance  Gait / Transfers Assistance Needed: Non-ambulatory at baseline, performing transfers to/from manual [facility] with significant assist from son.  ADL's / Homemaking Assistance Needed: Assist from daughter as needed for ADLs, grooming (wears depends, uses bedpan)            OT Problem List: Decreased strength;Decreased knowledge of use of DME or AE;Impaired tone;Decreased range of motion;Decreased coordination;Decreased activity  tolerance;Decreased cognition;Impaired UE functional use;Pain;Decreased safety awareness;Impaired balance (sitting and/or standing)      OT Treatment/Interventions: Self-care/ADL training;Balance training;Therapeutic exercise;Therapeutic activities;Neuromuscular education;Cognitive remediation/compensation;DME and/or AE instruction;Patient/family education    OT Goals(Current goals can be found in the care plan section) Acute Rehab OT Goals Patient Stated Goal: to get better OT Goal Formulation: With patient Time For Goal Achievement: 07/16/18 Potential to Achieve Goals: Good ADL Goals Pt Will Perform Eating: with min guard assist;with adaptive utensils;bed level Pt Will Perform Grooming: with min guard assist;with set-up;bed level Additional ADL Goal #1: Pt will perform bed mobility with mod assist for toileting/hygiene/bathing tasks at bed level.  OT Frequency: Min 1X/week   Barriers to D/C:            Co-evaluation              AM-PAC PT "6 Clicks" Daily Activity     Outcome Measure Help from another person eating meals?: A Lot Help from another person taking care of personal grooming?: A Lot Help from another person toileting, which includes using toliet, bedpan, or urinal?: A Lot Help from another person bathing (including washing, rinsing, drying)?: A Lot Help from another person to put on and taking off regular upper body clothing?: A Lot Help from another person to put on and taking off  regular lower body clothing?: A Lot 6 Click Score: 12   End of Session Nurse Communication: Other (comment)(notified CNA of pt's need for assist for feeding)  Activity Tolerance: Patient tolerated treatment well Patient left: in bed;with call bell/phone within reach;with bed alarm set;Other (comment)(imaging staff present)  OT Visit Diagnosis: Other abnormalities of gait and mobility (R26.89);Muscle weakness (generalized) (M62.81);Other symptoms and signs involving cognitive  function;Feeding difficulties (R63.3)                Time: 1610-9604 OT Time Calculation (min): 22 min Charges:  OT General Charges $OT Visit: 1 Visit OT Evaluation $OT Eval Moderate Complexity: 1 Mod OT Treatments $Self Care/Home Management : 8-22 mins  Richrd Prime, MPH, MS, OTR/L ascom [phone] 07/02/18, 9:43 AM

## 2018-07-02 NOTE — Care Management Important Message (Signed)
Copy of signed IM left with patient in room.  

## 2018-07-02 NOTE — Progress Notes (Signed)
[employer] - [place] [facility]                                                                                                                                                                                  Patient Demographics   Destiny Mack, is a 72 y.o. female, DOB - 09/27/1945, ZOX:[id number]  Admit date - 06/24/2018   Admitting Physician Adrian Saran, MD  Outpatient Primary MD for the patient is Bender, Earl Lagos, MD   LOS - 8  Subjective: Patient's wheezing is improved today  Review of Systems:    CONSTITUTIONAL: No documented fever. No fatigue, weakness. No weight gain, no weight loss.  EYES: No blurry or double vision.  ENT: No tinnitus. No postnasal drip. No redness of the oropharynx.  RESPIRATORY: Upper airway wheezing improved no cough, no wheeze, no hemoptysis. No dyspnea.  CARDIOVASCULAR: No chest pain. No orthopnea. No palpitations. No syncope.  GASTROINTESTINAL: No nausea, no vomiting or diarrhea. No abdominal pain. No melena or hematochezia.  GENITOURINARY:  No urgency. No frequency. No dysuria. No hematuria. No obstructive symptoms. No discharge. No pain. No significant abnormal bleeding ENDOCRINE: No polyuria or nocturia. No heat or cold intolerance.  HEMATOLOGY: No anemia. No bruising. No bleeding. No purpura. No petechiae INTEGUMENTARY: No rashes. No lesions.  MUSCULOSKELETAL: No arthritis. No swelling. No gout.  NEUROLOGIC: No numbness, tingling, or ataxia. No seizure-type activity.  PSYCHIATRIC: No anxiety. No insomnia. No ADD.     Vitals:   Vitals:   07/01/18 1553 07/01/18 2018 07/02/18 0929 07/02/18 0951  BP: (!) 159/73 (!) 151/69  (!) 114/58  Pulse: 84 85 77   Resp: 20 20    Temp: 98.7 F (37.1 C) 98.4 F (36.9 C)    TempSrc: Oral Oral    SpO2: 97% 96% 98%   Weight:      Height:        Wt Readings from Last 3 Encounters:  06/29/18 65.1 kg  05/20/18 59.2 kg  01/09/18 77.1 kg     Intake/Output Summary (Last 24 hours)  at 07/02/2018 1258 Last data filed at 07/02/2018 1006 Gross per 24 hour  Intake 1086.01 ml  Output 620 ml  Net 466.01 ml    Physical Exam:   GENERAL: Critically ill on the vent HEAD, EYES, EARS, NOSE AND THROAT: Atraumatic, normocephalic. Extraocular muscles are intact. Pupils equal and reactive to light. Sclerae anicteric. No conjunctival injection. No oro-pharyngeal erythema.  NECK: Supple. There is no jugular venous distention. No bruits, no lymphadenopathy, no thyromegaly.  HEART: Regular rate and rhythm,. No murmurs, no rubs, no clicks.  LUNGS: Upper airway wheezing y. No rales or rhonchi. No wheezes.  ABDOMEN: Soft, flat, nontender, nondistended. Has good bowel sounds. No hepatosplenomegaly appreciated.  EXTREMITIES: No evidence of any cyanosis, clubbing, or peripheral edema.  +2 pedal and radial pulses bilaterally.  NEUROLOGIC: Sedated on the vent SKIN: Moist and warm with no rashes appreciated.  Psych: Not anxious, depressed LN: No inguinal LN enlargement    Antibiotics   Anti-infectives (From admission, onward)   Start     Dose/Rate Route Frequency Ordered Stop   06/29/18 1400  ceFAZolin (ANCEF) IVPB 1 g/50 mL premix  Status:  Discontinued     1 g 100 mL/hr over 30 Minutes Intravenous Every 8 hours 06/29/18 0819 07/01/18 1123   06/28/18 1700  ceFAZolin (ANCEF) IVPB 1 g/50 mL premix  Status:  Discontinued     1 g 100 mL/hr over 30 Minutes Intravenous Every 12 hours 06/28/18 1135 06/29/18 0819   06/27/18 2200  ceFAZolin (ANCEF) IVPB 1 g/50 mL premix  Status:  Discontinued     1 g 100 mL/hr over 30 Minutes Intravenous Every 8 hours 06/27/18 1642 06/28/18 1135   06/25/18 1200  vancomycin (VANCOCIN) IVPB 1000 mg/200 mL premix  Status:  Discontinued     1,000 mg 200 mL/hr over 60 Minutes Intravenous Every 36 hours 06/24/18 1535 06/25/18 1551   06/24/18 2200  piperacillin-tazobactam (ZOSYN) IVPB 3.375 g  Status:  Discontinued     3.375 g 12.5 mL/hr over 240 Minutes  Intravenous Every 8 hours 06/24/18 1535 06/24/18 1635   06/24/18 2200  ceFEPIme (MAXIPIME) 1 g in sodium chloride 0.9 % 100 mL IVPB  Status:  Discontinued     1 g 200 mL/hr over 30 Minutes Intravenous Every 24 hours 06/24/18 1637 06/27/18 1642   06/24/18 1535  vancomycin variable dose per unstable renal function (pharmacist dosing)  Status:  Discontinued      Does not apply See admin instructions 06/24/18 1535 06/25/18 1039   06/24/18 1445  vancomycin (VANCOCIN) IVPB 1000 mg/200 mL premix     1,000 mg 200 mL/hr over 60 Minutes Intravenous  Once 06/24/18 1441 06/24/18 1653   06/24/18 1445  piperacillin-tazobactam (ZOSYN) IVPB 3.375 g     3.375 g 100 mL/hr over 30 Minutes Intravenous  Once 06/24/18 1441 06/24/18 1545      Medications   Scheduled Meds: . amLODipine  10 mg Per Tube Daily  . apixaban  5 mg Oral BID  . atorvastatin  20 mg Per Tube QHS  . budesonide (PULMICORT) nebulizer solution  0.25 mg Nebulization BID  . famotidine  20 mg Per Tube QHS  . guaiFENesin  600 mg Oral BID  . ipratropium-albuterol  3 mL Nebulization TID  . mouth rinse  15 mL Mouth Rinse BID  . methylPREDNISolone (SOLU-MEDROL) injection  60 mg Intravenous Q12H  . sodium chloride flush  10-40 mL Intracatheter Q12H   Continuous Infusions: . sodium chloride Stopped (06/27/18 0448)  . lactated ringers 50 mL/hr at 07/02/18 0703   PRN Meds:.sodium chloride, acetaminophen **OR** acetaminophen, ipratropium-albuterol, labetalol, senna-docusate, sodium chloride flush   Data Review:   Micro Results Recent Results (from the past 240 hour(s))  Culture, blood (Routine x 2)     Status: None   Collection Time: 06/24/18  1:20 PM  Result Value Ref Range Status   Specimen Description BLOOD BLOOD LEFT ARM  Final   Special Requests   Final    BOTTLES DRAWN AEROBIC AND ANAEROBIC Blood Culture adequate volume   Culture   Final    NO GROWTH 5 DAYS Performed at  [facility], [address] [place], [place]  [address]    Report Status 06/29/2018 FINAL  Final  Culture, blood (Routine x 2)     Status: None   Collection Time: 06/24/18  1:20 PM  Result Value Ref Range Status   Specimen Description BLOOD BLOOD RIGHT HAND  Final   Special Requests   Final    BOTTLES DRAWN AEROBIC AND ANAEROBIC Blood Culture results may not be optimal due to an excessive volume of blood received in culture bottles   Culture   Final    NO GROWTH 5 DAYS Performed at [facility], [address] [place], [place] [address]    Report Status 06/29/2018 FINAL  Final  Urine Culture     Status: Abnormal   Collection Time: 06/24/18  1:54 PM  Result Value Ref Range Status   Specimen Description   Final    URINE, RANDOM Performed at [facility], [address] [place], [place] [address]    Special Requests   Final    Normal Performed at [facility], [address] [place], [place] [address]    Culture >=100,000 COLONIES/mL ESCHERICHIA COLI (A)  Final   Report Status 06/27/2018 FINAL  Final   Organism ID, Bacteria ESCHERICHIA COLI (A)  Final      Susceptibility   Escherichia coli - MIC*    AMPICILLIN >=32 RESISTANT Resistant     CEFAZOLIN <=4 SENSITIVE Sensitive     CEFTRIAXONE <=1 SENSITIVE Sensitive     CIPROFLOXACIN >=4 RESISTANT Resistant     GENTAMICIN <=1 SENSITIVE Sensitive     IMIPENEM <=0.25 SENSITIVE Sensitive     NITROFURANTOIN <=16 SENSITIVE Sensitive     TRIMETH/SULFA <=20 SENSITIVE Sensitive     AMPICILLIN/SULBACTAM 16 INTERMEDIATE Intermediate     PIP/TAZO <=4 SENSITIVE Sensitive     Extended ESBL NEGATIVE Sensitive     * >=100,000 COLONIES/mL ESCHERICHIA COLI  MRSA PCR Screening     Status: None   Collection Time: 06/25/18 10:56 AM  Result Value Ref Range Status   MRSA by PCR NEGATIVE NEGATIVE Final    Comment:        The GeneXpert MRSA Assay (FDA approved for NASAL specimens only), is one component of a comprehensive MRSA colonization surveillance program. It  is not intended to diagnose MRSA infection nor to guide or monitor treatment for MRSA infections. Performed at [facility], [address], [place], [place] [address]     Radiology Reports Dg Abd 1 View  Result Date: 06/24/2018 CLINICAL DATA:  72 year old female. Orogastric tube placement. Initial encounter. EXAM: ABDOMEN - 1 VIEW COMPARISON:  06/24/2018 chest x-ray. 10/29/2017 abdominal plain film exam. FINDINGS: Nasogastric tube tip gastric antrum level with side hole gastric body level. Radiopaque structure left upper quadrant unchanged from prior exam of questionable etiology. No free air detected on this erect view. IMPRESSION: Nasogastric tube tip gastric antrum level with side hole gastric body level. Electronically Signed   By: Lacy Duverney M.D.   On: 06/24/2018 17:41   Ct Head Wo Contrast  Result Date: 06/24/2018 CLINICAL DATA:  Altered mental status with questionable seizure EXAM: CT HEAD WITHOUT CONTRAST TECHNIQUE: Contiguous axial images were obtained from the base of the skull through the vertex without intravenous contrast. COMPARISON:  Head CT May 20, 2018 and brain MRI May 21, 2018 FINDINGS: Brain: Moderate diffuse atrophy is stable. There is no intracranial mass, hemorrhage, extra-axial fluid collection, or midline shift. There is evidence of  a prior infarct in the anterior left frontal lobe, midportion, stable. There is extensive small vessel disease throughout the centra semiovale bilaterally. There is evidence of a prior infarct at the right gray-white compartment junction of the posterior right frontal lobe. No acute infarct is demonstrable. Vascular: There is no appreciable hyperdense vessel. There is calcification in each carotid siphon region. Skull: The bony calvarium appears intact. Sinuses/Orbits: There is a retention cyst in the inferolateral left maxillary antrum. There is slight mucosal thickening in the right posterior maxillary sinus. There is  extensive opacification in multiple ethmoid air cells. There is opacification in the posterior sphenoid sinus regions. Orbits appear symmetric bilaterally. Other: Mastoid air cells are clear. IMPRESSION: 1. Stable atrophy with supratentorial small vessel disease. Prior frontal lobe region infarcts bilaterally, larger on the left than on the right, stable. No acute infarct. No mass or hemorrhage. 2.  There are foci of arterial vascular calcification. 3.  There is multifocal paranasal sinus disease. Electronically Signed   By: Bretta Bang III M.D.   On: 06/24/2018 15:10   Dg Chest Port 1 View  Result Date: 07/02/2018 CLINICAL DATA:  72 year old female with acute shortness of breath. EXAM: PORTABLE CHEST 1 VIEW COMPARISON:  06/30/2018 and prior radiographs FINDINGS: The cardiomediastinal silhouette is unremarkable. A RIGHT PICC line with tip overlying the LOWER SVC again noted. Subsegmental atelectasis within the LEFT LOWER lung now noted. There is no evidence of focal airspace disease, pulmonary edema, suspicious pulmonary nodule/mass, pleural effusion, or pneumothorax. No acute bony abnormalities are identified. IMPRESSION: No active disease. Electronically Signed   By: Harmon Pier M.D.   On: 07/02/2018 09:24   Dg Chest Port 1 View  Result Date: 06/30/2018 CLINICAL DATA:  Pneumonia. EXAM: PORTABLE CHEST 1 VIEW COMPARISON:  06/2018 FINDINGS: The patient has been extubated. Endotracheal tube has been removed. Right PICC line in stable position. Cardiomediastinal silhouette is normal. Mediastinal contours appear intact. Calcific atherosclerotic disease of the aorta and tortuosity. There is no evidence of pleural effusion or pneumothorax. Minimal peribronchial thickening in the left lower lobe. Osseous structures are without acute abnormality. Soft tissues are grossly normal. IMPRESSION: Minimal peribronchial thickening in the left lower lobe may represent atelectasis or airspace consolidation.  Electronically Signed   By: Ted Mcalpine M.D.   On: 06/30/2018 07:09   Dg Chest Port 1 View  Result Date: 06/29/2018 CLINICAL DATA:  Pneumonia. EXAM: PORTABLE CHEST 1 VIEW COMPARISON:  One-view chest x-ray 06/27/2018 FINDINGS: Heart size is normal. Endotracheal tube terminates 2.5 cm above the carina. NG tube courses off the inferior border of the film. Right-sided PICC line is stable. Left greater than right basilar airspace disease is present. This likely reflects atelectasis. No other significant airspace disease is present. IMPRESSION: 1. Support apparatus is stable. 2. Left greater than right basilar airspace disease, likely atelectasis. Electronically Signed   By: Marin Roberts M.D.   On: 06/29/2018 07:22   Dg Chest Port 1 View  Result Date: 06/27/2018 CLINICAL DATA:  Intubated patient.  Follow-up study. EXAM: PORTABLE CHEST 1 VIEW COMPARISON:  06/26/2018 FINDINGS: Endotracheal tube tip projects 2 cm above the Carina. Nasal/orogastric tube passes below the diaphragm into the stomach. New right PICC line tip projects just above the caval atrial junction. Left pleural effusion noted on the previous day's study is not well-defined. Are prominent bronchovascular markings. Lungs otherwise clear with no evidence of pulmonary edema. No pneumothorax. IMPRESSION: 1. Support apparatus is well positioned. 2. Small left pleural effusion defined on the  current AP portable chest radiograph. No acute findings in the lungs. No pneumothorax. Electronically Signed   By: Amie Portland M.D.   On: 06/27/2018 06:44   Dg Chest Port 1 View  Result Date: 06/26/2018 CLINICAL DATA:  Acute onset of respiratory failure. EXAM: PORTABLE CHEST 1 VIEW COMPARISON:  Chest radiograph performed 06/24/2018 FINDINGS: The patient's endotracheal tube is seen ending 1-2 cm above the carina. This could be retracted 1-2 cm. A small left pleural effusion is noted. No pneumothorax is seen. The cardiomediastinal silhouette is  normal in size. No acute osseous abnormalities are identified. The patient's enteric tube is noted extending below the carina. IMPRESSION: 1. Endotracheal tube seen ending 1-2 cm above the carina. This could be retracted 1-2 cm. 2. Small left pleural effusion noted. Lungs otherwise grossly clear. Electronically Signed   By: Roanna Raider M.D.   On: 06/26/2018 09:36   Dg Chest Portable 1 View  Result Date: 06/24/2018 CLINICAL DATA:  To mental status x2 hours. EXAM: PORTABLE CHEST 1 VIEW COMPARISON:  None. FINDINGS: Endotracheal tube tip terminates 4 cm above the carina in satisfactory position. Gastric tube with side port projects below the left hemidiaphragm in the expected location of the stomach. The tip is excluded on this study however. The heart size and mediastinal contours are within normal limits. Mild pulmonary hyperinflation. Minimal aortic atherosclerosis similar to prior. No alveolar consolidation. Remote right-sided rib fractures involving the sixth and seventh ribs. IMPRESSION: 1. Mild pulmonary hyperinflation. 2. Satisfactory support line and tube positions. 3. Minimal aortic atherosclerosis. Electronically Signed   By: Tollie Eth M.D.   On: 06/24/2018 14:07   [place] Ekg Site Rite  Result Date: 06/27/2018 If Site Rite image not attached, placement could not be confirmed due to current cardiac rhythm.  [place] Ekg Site Rite  Result Date: 06/27/2018 If Site Rite image not attached, placement could not be confirmed due to current cardiac rhythm.    CBC Recent Labs  Lab 06/27/18 0427  06/27/18 1831 06/28/18 0334 06/29/18 0455 06/30/18 0429 07/01/18 0503  WBC 7.9  --   --  10.5 9.6 9.5 8.6  HGB 7.0*   < > 8.4* 8.7* 8.4* 9.0* 9.4*  HCT 23.3*   < > 26.9* 28.7* 27.3* 29.3* 30.1*  PLT 125*  --   --  171 161 190 190  MCV 99.1  --   --  99.3 99.3 98.7 98.4  MCH 29.8  --   --  30.1 30.5 30.3 30.7  MCHC 30.0  --   --  30.3 30.8 30.7 31.2  RDW 15.5  --   --  15.6* 15.3 14.9 14.5   LYMPHSABS 1.1  --   --  1.2 1.3 1.1 1.0  MONOABS 0.8  --   --  1.0 0.9 0.8 0.8  EOSABS 0.2  --   --  0.4 0.4 0.5 0.4  BASOSABS 0.1  --   --  0.1 0.1 0.1 0.1   < > = values in this interval not displayed.    Chemistries  Recent Labs  Lab 06/27/18 0427 06/28/18 0334 06/29/18 0455 06/30/18 0429 07/01/18 0503 07/02/18 0649  NA 135 145 142 142 139 143  K 3.6 4.5 4.2 3.8 4.5 4.0  CL 110 116* 112* 109 105 111  CO2 20* 20* 24 23 23 27   GLUCOSE 90 99 112* 76 75 101*  BUN 31* 36* 36* 31* 25* 21  CREATININE 1.23* 1.32* 1.19* 1.23* 1.28* 1.20*  CALCIUM 7.3* 8.9 8.6* 8.9  8.8* 8.9  MG 2.1 2.4 2.3  --  2.0 2.0   ------------------------------------------------------------------------------------------------------------------ estimated creatinine clearance is 36.6 mL/min (A) (by C-G formula based on SCr of 1.2 mg/dL (H)). ------------------------------------------------------------------------------------------------------------------ No results for input(s): HGBA1C in the last 72 hours. ------------------------------------------------------------------------------------------------------------------ No results for input(s): CHOL, HDL, LDLCALC, TRIG, CHOLHDL, LDLDIRECT in the last 72 hours. ------------------------------------------------------------------------------------------------------------------ No results for input(s): TSH, T4TOTAL, T3FREE, THYROIDAB in the last 72 hours.  Invalid input(s): FREET3 ------------------------------------------------------------------------------------------------------------------ No results for input(s): VITAMINB12, FOLATE, FERRITIN, TIBC, IRON, RETICCTPCT in the last 72 hours.  Coagulation profile No results for input(s): INR, PROTIME in the last 168 hours.  No results for input(s): DDIMER in the last 72 hours.  Cardiac Enzymes Recent Labs  Lab 06/26/18 0709 06/26/18 1338 06/26/18 1920  TROPONINI 0.03* 0.03* 0.03*    ------------------------------------------------------------------------------------------------------------------ Invalid input(s): POCBNP    Assessment & Plan   72 year old female with history of chronic atrial fibrillation and recent discharge from the hospital at the end of September with subacute CVA who presented to the ER due to unresponsiveness.  *Acute hypoxic respiratory failure: Status post extubation- Continue to monitor respiratory status Repeat chest x-ray shows no significant abnormality  * Acute metabolic encephalopathy-Neg CT head, -Improved -PT pending  * Sepsis: present on admission due to urinary tract infection Continue IV fluids, Ancef Afebrile  * Atelectasis and LLL pneumonia: Ancef  * E Coli UTI -Cefazolin  switch to oral tomorrow  * chronic atrial fibrillation with controlled ventricular rate.Was on Eliquis. TEE 12/2017 LVEF 55 to 60% normal systolic function -Rate control and on heparin can switch over to Eliquis  * Acute on CKD stage III - due to sepsis: - improving with hydration  *Recent CVA: Continue Eliquis and statin once extubated  Prognosis poor      Code Status Orders  (From admission, onward)         Start     Ordered   06/24/18 1648  Full code  Continuous     06/24/18 1648        Code Status History    Date Active Date Inactive Code Status Order ID Comments User Context   05/20/2018 1847 05/21/2018 2212 Full Code [id number]  Ramonita Lab, MD Inpatient   05/20/2018 1847 05/20/2018 1847 Full Code [id number]  Ramonita Lab, MD Inpatient   12/31/2017 1538 01/04/2018 2130 DNR [id number]  Milagros Loll, MD ED   12/13/2017 1630 12/15/2017 2016 Full Code [id number]  Alford Highland, MD ED   11/22/2017 0202 11/23/2017 2009 DNR [id number]  Salary, Evelena Asa, MD ED   11/22/2017 0150 11/22/2017 0202 Full Code [id number]  Salary, Evelena Asa, MD ED   11/01/2017 1026 11/02/2017 0155 DNR [id number]  Delfino Lovett, MD Inpatient   10/29/2017 1256  11/01/2017 1026 Full Code [id number]  Alford Highland, MD ED           Consults intensive  DVT Prophylaxis heparin Lab Results  Component Value Date   PLT 190 07/01/2018     Time Spent in minutes 35-minute greater than 50% of time spent in care coordination and counseling patient regarding the condition and plan of care.   Auburn Bilberry M.D on 07/02/2018 at 12:58 PM  Between 7am to 6pm - Pager - [phone]  After 6pm go to [email or web address] - Social research officer, government  [employer]   Office  [phone]

## 2018-07-03 MED ORDER — CEFUROXIME AXETIL 500 MG PO TABS: 500.0000 mg | ORAL_TABLET | Freq: Two times a day (BID) | ORAL | 0 refills | Status: DC

## 2018-07-03 MED ORDER — IPRATROPIUM-ALBUTEROL 0.5-2.5 (3) MG/3ML IN SOLN
3.0000 mL | Freq: Two times a day (BID) | RESPIRATORY_TRACT | Status: DC
  Administered 2018-07-03: 3 mL via RESPIRATORY_TRACT
  Filled 2018-07-03: qty 3

## 2018-07-03 NOTE — Progress Notes (Signed)
Speech Language Pathology Treatment: Dysphagia  Patient Details Name: Destiny Mack MRN: [id number] DOB: 10-29-1945 Today's Date: 07/03/2018 Time: 6599-3570 SLP Time Calculation (min) (ACUTE ONLY): 40 min  Assessment / Plan / Recommendation Clinical Impression  Pt seen for ongoing assessment of toleration of diet w/ trials to upgrade to thin liquids today as able. Daughter is present and stated pt is desiring "Pepsi" not thickened as she drinks at home. Pt is more alert and able to follow through w/ instructions given verbal/visual/tactile cues. She continues to present w/ Cognitive decline though. Unsure of pt's baseline Cognitive status prior to admission.  Pt positioned upright in bed and given trials of thin liquids via Cup (NO Straw) w/ pt consuming single, small sips w/ no overt s/s of aspiration noted; no decline in vocal quality and no decline in respiratory status during/post trials. Pt helped to hold Cup when drinking. Much education was given to Daughter and pt on the need to follow aspiration precautions when drinking liquids to lessen risk for aspiration episodes as pt exhibits weakness and Cognitive decline. Pt also tolerated few trials of puree - no trials of solid foods were given d/t pt's lack of use of her Dentures in the past 1-2 weeks. Pt always wore her dentures to eat per Dtr. As pt has not worn the Dentures this admission, encouraged pt and Dtr to practice wearing them b/f attempting to eat foods, AND ensure for secure fit as they may be loose d/t pt's extended illness. Recommended this be practiced at home b/f any diet(foods) upgrade - pt is d/t discharge home today and can be followed by [place] or [place] services setup by Care Manager w/ Daughter.  Recommend upgrade to thin liquids VIA CUP w/ puree diet (Dysphagia level 1) w/ aspiration precautions and assistance and monitoring w/ all oral intake. Recommend Pills in Puree for safer swallowing from this point forward d/t  pt's Cognitive status. Recommend f/u for a more formal Cognitive assessment by Neurology as indicated to assess pt's baseline for ADLs. NSG updated. ST services available to monitor pt's toleration of diet while admitted.     HPI HPI: Pt is a 72 y/o female with history of chronic atrial fibrillation on anticoagulation, chronic kidney disease stage III depression, hypertension, recent subacute stroke and frequent falls. She was receiving PT/OT per Daughter present.  Patient presented to the ED after she was found unresponsive at her residence.  She was intubated in the emergency room for airway protection and CT head did not show any acute intracranial abnormalities.  Patient was recently discharged from the hospital after she was treated for subacute stroke.  She was found in a very poor hygienic state and living condition at her residence when brought to the ED.  She was found to have UTI and met criteria for sepsis.  Patient received vancomycin and Zosyn and cefepime while in the ER.  After admission to the CCU, NSG note reported color and characteristics of the gastric fluid coming from OG tube which could increase risk for aspiration to have occurred.  Pt has been NPO since oral intubation at admission.      SLP Plan  Continue with current plan of care       Recommendations  Diet recommendations: Dysphagia 1 (puree);Thin liquid Liquids provided via: Cup;No straw Medication Administration: Whole meds with puree(for safer swallowing from this point forward) Supervision: Staff to assist with self feeding;Full supervision/cueing for compensatory strategies(Daughter educated and given model ) Compensations: Minimize  environmental distractions;Slow rate;Small sips/bites;Lingual sweep for clearance of pocketing;Multiple dry swallows after each bite/sip;Follow solids with liquid Postural Changes and/or Swallow Maneuvers: Seated upright 90 degrees;Upright 30-60 min after meal                 General recommendations: (Dietician f/u) Oral Care Recommendations: Oral care BID;Staff/trained caregiver to provide oral care Follow up Recommendations: Home health SLP(vs Outpatient) SLP Visit Diagnosis: Dysphagia, oropharyngeal phase (R13.12)(declined Cognitive status) Plan: Continue with current plan of care       [place], [place], CCC-SLP [name] 07/03/2018, 4:05 PM

## 2018-07-03 NOTE — Care Management Note (Signed)
Case Management Note  Patient Details  Name: Destiny Mack MRN: [id number] Date of Birth: August 13, 1946   Patient to discharge home with daughter today.  Patient lives at home with daughter.  Daughter at bedside.  PCP Bender.  Daughter transports to appointments, or utilizes [employer] transportation. Daughter states that patient receives 56 hours a week from [facility] services.  Patient has hospital bed, WC, and BSC in home.  Hoyer lift has been ordered at discharge.  [name] with [facility] notified, and to be delivered to home.  Patient and family have declined SNF placement, and will return home with resumption of home health services.  [name] with [facility] notified of discharge.  EMS form completed and placed on chart.   Subjective/Objective:                    Action/Plan:   Expected Discharge Date:  07/03/18               Expected Discharge Plan:  Home w Home Health Services  In-House Referral:     Discharge planning Services  CM Consult  Post Acute Care Choice:  Resumption of Svcs/PTA Provider Choice offered to:     DME Arranged:  Other see comment(hoyer lift) DME Agency:  [facility]  HH Arranged:  RN, PT, Nurse's Aide, Social Work [facility] Agency:  [facility]  Status of Service:  Completed, signed off  If discussed at [employer], dates discussed:    Additional Comments:  Chapman Fitch, RN 07/03/2018, 11:13 AM

## 2018-07-03 NOTE — Discharge Summary (Signed)
[facility] - [place] at [facility], [place] y.o., DOB 06-Dec-1945, MRN [id number]. Admission date: 06/24/2018 Discharge Date 07/03/2018 Primary MD Oswaldo Conroy, MD Admitting Physician Adrian Saran, MD  Admission Diagnosis  Lower urinary tract infectious disease [N39.0] Acute renal insufficiency [N28.9] Altered mental status, unspecified altered mental status type [R41.82]  Discharge Diagnosis   Active Problems: Acute hypoxic respiratory failure due to acute metabolic encephalopathy Metabolic encephalopathy felt to be due to sepsis and UTI Sepsis due to UTI due to E. coli Possible community-acquired pneumonia Chronic atrial fibrillation Acute on chronic kidney disease stage III Recent CVA    Hospital Course 72 year old female with history of chronic atrial fibrillation and recent discharge from the hospital at the end of September with subacute CVA who presented to the ER due to unresponsiveness.  To protect her airway patient was initially intubated she was on the ventilator for a few days and subsequently extubated.  On admission she was noted to have presentation of sepsis due to UTI.  Culture showed E. coli.  Patient was treated with antibiotics.  And subsequently extubated.  She is doing much better.  She was seen by PT recommended skilled nursing facility however daughter wanted to take her home.             Consults  Critical care  Significant Tests:  See full reports for all details     Dg Abd 1 View  Result Date: 06/24/2018 CLINICAL DATA:  72 year old female. Orogastric tube placement. Initial encounter. EXAM: ABDOMEN - 1 VIEW COMPARISON:  06/24/2018 chest x-ray. 10/29/2017 abdominal plain film exam. FINDINGS: Nasogastric tube tip gastric antrum level with side hole gastric body level. Radiopaque structure left upper quadrant unchanged from prior exam of questionable etiology. No free air detected on this erect view. IMPRESSION:  Nasogastric tube tip gastric antrum level with side hole gastric body level. Electronically Signed   By: Lacy Duverney M.D.   On: 06/24/2018 17:41   Ct Head Wo Contrast  Result Date: 06/24/2018 CLINICAL DATA:  Altered mental status with questionable seizure EXAM: CT HEAD WITHOUT CONTRAST TECHNIQUE: Contiguous axial images were obtained from the base of the skull through the vertex without intravenous contrast. COMPARISON:  Head CT May 20, 2018 and brain MRI May 21, 2018 FINDINGS: Brain: Moderate diffuse atrophy is stable. There is no intracranial mass, hemorrhage, extra-axial fluid collection, or midline shift. There is evidence of a prior infarct in the anterior left frontal lobe, midportion, stable. There is extensive small vessel disease throughout the centra semiovale bilaterally. There is evidence of a prior infarct at the right gray-white compartment junction of the posterior right frontal lobe. No acute infarct is demonstrable. Vascular: There is no appreciable hyperdense vessel. There is calcification in each carotid siphon region. Skull: The bony calvarium appears intact. Sinuses/Orbits: There is a retention cyst in the inferolateral left maxillary antrum. There is slight mucosal thickening in the right posterior maxillary sinus. There is extensive opacification in multiple ethmoid air cells. There is opacification in the posterior sphenoid sinus regions. Orbits appear symmetric bilaterally. Other: Mastoid air cells are clear. IMPRESSION: 1. Stable atrophy with supratentorial small vessel disease. Prior frontal lobe region infarcts bilaterally, larger on the left than on the right, stable. No acute infarct. No mass or hemorrhage. 2.  There are foci of arterial vascular calcification. 3.  There is multifocal paranasal sinus disease. Electronically Signed   By: Bretta Bang III M.D.   On: 06/24/2018 15:10  Dg Chest Port 1 View  Result Date: 07/02/2018 CLINICAL DATA:  72 year old  female with acute shortness of breath. EXAM: PORTABLE CHEST 1 VIEW COMPARISON:  06/30/2018 and prior radiographs FINDINGS: The cardiomediastinal silhouette is unremarkable. A RIGHT PICC line with tip overlying the LOWER SVC again noted. Subsegmental atelectasis within the LEFT LOWER lung now noted. There is no evidence of focal airspace disease, pulmonary edema, suspicious pulmonary nodule/mass, pleural effusion, or pneumothorax. No acute bony abnormalities are identified. IMPRESSION: No active disease. Electronically Signed   By: Harmon Pier M.D.   On: 07/02/2018 09:24   Dg Chest Port 1 View  Result Date: 06/30/2018 CLINICAL DATA:  Pneumonia. EXAM: PORTABLE CHEST 1 VIEW COMPARISON:  06/2018 FINDINGS: The patient has been extubated. Endotracheal tube has been removed. Right PICC line in stable position. Cardiomediastinal silhouette is normal. Mediastinal contours appear intact. Calcific atherosclerotic disease of the aorta and tortuosity. There is no evidence of pleural effusion or pneumothorax. Minimal peribronchial thickening in the left lower lobe. Osseous structures are without acute abnormality. Soft tissues are grossly normal. IMPRESSION: Minimal peribronchial thickening in the left lower lobe may represent atelectasis or airspace consolidation. Electronically Signed   By: Ted Mcalpine M.D.   On: 06/30/2018 07:09   Dg Chest Port 1 View  Result Date: 06/29/2018 CLINICAL DATA:  Pneumonia. EXAM: PORTABLE CHEST 1 VIEW COMPARISON:  One-view chest x-ray 06/27/2018 FINDINGS: Heart size is normal. Endotracheal tube terminates 2.5 cm above the carina. NG tube courses off the inferior border of the film. Right-sided PICC line is stable. Left greater than right basilar airspace disease is present. This likely reflects atelectasis. No other significant airspace disease is present. IMPRESSION: 1. Support apparatus is stable. 2. Left greater than right basilar airspace disease, likely atelectasis.  Electronically Signed   By: Marin Roberts M.D.   On: 06/29/2018 07:22   Dg Chest Port 1 View  Result Date: 06/27/2018 CLINICAL DATA:  Intubated patient.  Follow-up study. EXAM: PORTABLE CHEST 1 VIEW COMPARISON:  06/26/2018 FINDINGS: Endotracheal tube tip projects 2 cm above the Carina. Nasal/orogastric tube passes below the diaphragm into the stomach. New right PICC line tip projects just above the caval atrial junction. Left pleural effusion noted on the previous day's study is not well-defined. Are prominent bronchovascular markings. Lungs otherwise clear with no evidence of pulmonary edema. No pneumothorax. IMPRESSION: 1. Support apparatus is well positioned. 2. Small left pleural effusion defined on the current AP portable chest radiograph. No acute findings in the lungs. No pneumothorax. Electronically Signed   By: Amie Portland M.D.   On: 06/27/2018 06:44   Dg Chest Port 1 View  Result Date: 06/26/2018 CLINICAL DATA:  Acute onset of respiratory failure. EXAM: PORTABLE CHEST 1 VIEW COMPARISON:  Chest radiograph performed 06/24/2018 FINDINGS: The patient's endotracheal tube is seen ending 1-2 cm above the carina. This could be retracted 1-2 cm. A small left pleural effusion is noted. No pneumothorax is seen. The cardiomediastinal silhouette is normal in size. No acute osseous abnormalities are identified. The patient's enteric tube is noted extending below the carina. IMPRESSION: 1. Endotracheal tube seen ending 1-2 cm above the carina. This could be retracted 1-2 cm. 2. Small left pleural effusion noted. Lungs otherwise grossly clear. Electronically Signed   By: Roanna Raider M.D.   On: 06/26/2018 09:36   Dg Chest Portable 1 View  Result Date: 06/24/2018 CLINICAL DATA:  To mental status x2 hours. EXAM: PORTABLE CHEST 1 VIEW COMPARISON:  None. FINDINGS: Endotracheal tube tip terminates  4 cm above the carina in satisfactory position. Gastric tube with side port projects below the left  hemidiaphragm in the expected location of the stomach. The tip is excluded on this study however. The heart size and mediastinal contours are within normal limits. Mild pulmonary hyperinflation. Minimal aortic atherosclerosis similar to prior. No alveolar consolidation. Remote right-sided rib fractures involving the sixth and seventh ribs. IMPRESSION: 1. Mild pulmonary hyperinflation. 2. Satisfactory support line and tube positions. 3. Minimal aortic atherosclerosis. Electronically Signed   By: Tollie Eth M.D.   On: 06/24/2018 14:07   [place] Ekg Site Rite  Result Date: 06/27/2018 If Site Rite image not attached, placement could not be confirmed due to current cardiac rhythm.  [place] Ekg Site Rite  Result Date: 06/27/2018 If Site Rite image not attached, placement could not be confirmed due to current cardiac rhythm.      Today   Subjective:   Clare Charon patient doing much better wants to go home  Objective:   Blood pressure (!) 160/70, pulse 95, temperature 98.8 F (37.1 C), temperature source Oral, resp. rate 16, height 5\' 4"  (1.626 m), weight 65.1 kg, SpO2 97 %.  .  Intake/Output Summary (Last 24 hours) at 07/03/2018 1417 Last data filed at 07/03/2018 0908 Gross per 24 hour  Intake 1168.91 ml  Output 650 ml  Net 518.91 ml    Exam VITAL SIGNS: Blood pressure (!) 160/70, pulse 95, temperature 98.8 F (37.1 C), temperature source Oral, resp. rate 16, height 5\' 4"  (1.626 m), weight 65.1 kg, SpO2 97 %.  GENERAL:  72 y.o.-year-old patient lying in the bed with no acute distress.  EYES: Pupils equal, round, reactive to light and accommodation. No scleral icterus. Extraocular muscles intact.  HEENT: Head atraumatic, normocephalic. Oropharynx and nasopharynx clear.  NECK:  Supple, no jugular venous distention. No thyroid enlargement, no tenderness.  LUNGS: Normal breath sounds bilaterally, no wheezing, rales,rhonchi or crepitation. No use of accessory muscles of respiration.   CARDIOVASCULAR: S1, S2 normal. No murmurs, rubs, or gallops.  ABDOMEN: Soft, nontender, nondistended. Bowel sounds present. No organomegaly or mass.  EXTREMITIES: No pedal edema, cyanosis, or clubbing.  NEUROLOGIC: Cranial nerves II through XII are intact. Muscle strength 5/5 in all extremities. Sensation intact. Gait not checked.  PSYCHIATRIC: The patient is alert and oriented x 3.  SKIN: No obvious rash, lesion, or ulcer.   Data Review     CBC w Diff:  Lab Results  Component Value Date   WBC 8.6 07/01/2018   HGB 9.4 (L) 07/01/2018   HCT 30.1 (L) 07/01/2018   PLT 190 07/01/2018   LYMPHOPCT 11 07/01/2018   MONOPCT 10 07/01/2018   EOSPCT 5 07/01/2018   BASOPCT 1 07/01/2018   CMP:  Lab Results  Component Value Date   NA 143 07/02/2018   K 4.0 07/02/2018   CL 111 07/02/2018   CO2 27 07/02/2018   BUN 21 07/02/2018   CREATININE 1.20 (H) 07/02/2018   PROT 7.1 06/24/2018   ALBUMIN 3.6 06/24/2018   BILITOT 0.6 06/24/2018   ALKPHOS 97 06/24/2018   AST 18 06/24/2018   ALT 11 06/24/2018  .  Micro Results Recent Results (from the past 240 hour(s))  Culture, blood (Routine x 2)     Status: None   Collection Time: 06/24/18  1:20 PM  Result Value Ref Range Status   Specimen Description BLOOD BLOOD LEFT ARM  Final   Special Requests   Final    BOTTLES DRAWN AEROBIC AND ANAEROBIC  Blood Culture adequate volume   Culture   Final    NO GROWTH 5 DAYS Performed at [facility], [address] [place], [place] [address]    Report Status 06/29/2018 FINAL  Final  Culture, blood (Routine x 2)     Status: None   Collection Time: 06/24/18  1:20 PM  Result Value Ref Range Status   Specimen Description BLOOD BLOOD RIGHT HAND  Final   Special Requests   Final    BOTTLES DRAWN AEROBIC AND ANAEROBIC Blood Culture results may not be optimal due to an excessive volume of blood received in culture bottles   Culture   Final    NO GROWTH 5 DAYS Performed at [facility],  [address] [place], [place] [address]    Report Status 06/29/2018 FINAL  Final  Urine Culture     Status: Abnormal   Collection Time: 06/24/18  1:54 PM  Result Value Ref Range Status   Specimen Description   Final    URINE, RANDOM Performed at [facility], [address], [place], [place] [address]    Special Requests   Final    Normal Performed at [facility], [address] [place], [place] [address]    Culture >=100,000 COLONIES/mL ESCHERICHIA COLI (A)  Final   Report Status 06/27/2018 FINAL  Final   Organism ID, Bacteria ESCHERICHIA COLI (A)  Final      Susceptibility   Escherichia coli - MIC*    AMPICILLIN >=32 RESISTANT Resistant     CEFAZOLIN <=4 SENSITIVE Sensitive     CEFTRIAXONE <=1 SENSITIVE Sensitive     CIPROFLOXACIN >=4 RESISTANT Resistant     GENTAMICIN <=1 SENSITIVE Sensitive     IMIPENEM <=0.25 SENSITIVE Sensitive     NITROFURANTOIN <=16 SENSITIVE Sensitive     TRIMETH/SULFA <=20 SENSITIVE Sensitive     AMPICILLIN/SULBACTAM 16 INTERMEDIATE Intermediate     PIP/TAZO <=4 SENSITIVE Sensitive     Extended ESBL NEGATIVE Sensitive     * >=100,000 COLONIES/mL ESCHERICHIA COLI  MRSA PCR Screening     Status: None   Collection Time: 06/25/18 10:56 AM  Result Value Ref Range Status   MRSA by PCR NEGATIVE NEGATIVE Final    Comment:        The GeneXpert MRSA Assay (FDA approved for NASAL specimens only), is one component of a comprehensive MRSA colonization surveillance program. It is not intended to diagnose MRSA infection nor to guide or monitor treatment for MRSA infections. Performed at [facility], [address], [place], [place] [address]         Code Status Orders  (From admission, onward)         Start     Ordered   06/24/18 1648  Full code  Continuous     06/24/18 1648        Code Status History    Date Active Date Inactive Code Status Order ID Comments User Context   05/20/2018 1847 05/21/2018 2212  Full Code [id number]  Ramonita Lab, MD Inpatient   05/20/2018 1847 05/20/2018 1847 Full Code [id number]  Ramonita Lab, MD Inpatient   12/31/2017 1538 01/04/2018 2130 DNR [id number]  Milagros Loll, MD ED   12/13/2017 1630 12/15/2017 2016 Full Code [id number]  Alford Highland, MD ED   11/22/2017 0202 11/23/2017 2009 DNR [id number]  Bertrum Sol, MD ED   11/22/2017 0150 11/22/2017 0202 Full Code [id number]  Salary, Evelena Asa, MD ED   11/01/2017 1026 11/02/2017 0155  DNR [id number]  Delfino Lovett, MD Inpatient   10/29/2017 1256 11/01/2017 1026 Full Code [id number]  Alford Highland, MD ED          Follow-up Information    Oswaldo Conroy, MD. Go on 07/11/2018.   Specialty:  Family Medicine Why:  Dr. Letta Pate, Thursday, 9/14 at 9 a.m. [phone] Contact information: [address] [place] [address] [phone]           Discharge Medications   Allergies as of 07/03/2018   No Known Allergies     Medication List    TAKE these medications   amLODipine 5 MG tablet Commonly known as:  NORVASC Take 5 mg by mouth daily.   apixaban 5 MG Tabs tablet Commonly known as:  ELIQUIS Take 1 tablet (5 mg total) by mouth 2 (two) times daily.   atorvastatin 20 MG tablet Commonly known as:  LIPITOR Take 20 mg by mouth at bedtime.   cefUROXime 500 MG tablet Commonly known as:  CEFTIN Take 1 tablet (500 mg total) by mouth 2 (two) times daily for 3 days.            Durable Medical Equipment  (From admission, onward)         Start     Ordered   07/03/18 1023  For home use only DME Other see comment  Once    Comments:  Michiel Sites lift   07/03/18 1027             Total Time in preparing paper work, data evaluation and todays exam - 35 minutes  Auburn Bilberry M.D on 07/03/2018 at 2:17 PM [facility]   Office  [phone]

## 2018-07-03 NOTE — Clinical Social Work Note (Signed)
CSW contacted Barnetta Hammersmith: [phone], [employer] APS to notify of discharge home today with daughter. CSW left voicemail for [place].   Ruthe Mannan MSW, [address] [phone]

## 2018-07-06 ENCOUNTER — Encounter: Payer: Self-pay | Admitting: Emergency Medicine

## 2018-07-06 ENCOUNTER — Emergency Department: Payer: Medicare HMO

## 2018-07-06 ENCOUNTER — Observation Stay: Payer: Medicare HMO

## 2018-07-06 ENCOUNTER — Observation Stay
Admission: EM | Admit: 2018-07-06 | Discharge: 2018-07-09 | Disposition: A | Discharge status: Home Health | Payer: Medicare HMO | Attending: Internal Medicine | Admitting: Internal Medicine

## 2018-07-06 ENCOUNTER — Other Ambulatory Visit: Payer: Self-pay

## 2018-07-06 DIAGNOSIS — R258 Other abnormal involuntary movements: Secondary | ICD-10-CM | POA: Insufficient documentation

## 2018-07-06 DIAGNOSIS — R1311 Dysphagia, oral phase: Secondary | ICD-10-CM | POA: Diagnosis not present

## 2018-07-06 DIAGNOSIS — Z9181 History of falling: Secondary | ICD-10-CM | POA: Insufficient documentation

## 2018-07-06 DIAGNOSIS — Z8673 Personal history of transient ischemic attack (TIA), and cerebral infarction without residual deficits: Secondary | ICD-10-CM | POA: Diagnosis not present

## 2018-07-06 DIAGNOSIS — R55 Syncope and collapse: Principal | ICD-10-CM | POA: Insufficient documentation

## 2018-07-06 DIAGNOSIS — N390 Urinary tract infection, site not specified: Secondary | ICD-10-CM | POA: Diagnosis not present

## 2018-07-06 DIAGNOSIS — Z79899 Other long term (current) drug therapy: Secondary | ICD-10-CM | POA: Diagnosis not present

## 2018-07-06 DIAGNOSIS — E875 Hyperkalemia: Secondary | ICD-10-CM | POA: Insufficient documentation

## 2018-07-06 DIAGNOSIS — D631 Anemia in chronic kidney disease: Secondary | ICD-10-CM | POA: Insufficient documentation

## 2018-07-06 DIAGNOSIS — N183 Chronic kidney disease, stage 3 (moderate): Secondary | ICD-10-CM | POA: Diagnosis not present

## 2018-07-06 DIAGNOSIS — E860 Dehydration: Secondary | ICD-10-CM | POA: Insufficient documentation

## 2018-07-06 DIAGNOSIS — N179 Acute kidney failure, unspecified: Secondary | ICD-10-CM | POA: Insufficient documentation

## 2018-07-06 DIAGNOSIS — I129 Hypertensive chronic kidney disease with stage 1 through stage 4 chronic kidney disease, or unspecified chronic kidney disease: Secondary | ICD-10-CM | POA: Insufficient documentation

## 2018-07-06 DIAGNOSIS — R319 Hematuria, unspecified: Secondary | ICD-10-CM | POA: Insufficient documentation

## 2018-07-06 DIAGNOSIS — B9620 Unspecified Escherichia coli [E. coli] as the cause of diseases classified elsewhere: Secondary | ICD-10-CM | POA: Insufficient documentation

## 2018-07-06 DIAGNOSIS — F17210 Nicotine dependence, cigarettes, uncomplicated: Secondary | ICD-10-CM | POA: Insufficient documentation

## 2018-07-06 DIAGNOSIS — I480 Paroxysmal atrial fibrillation: Secondary | ICD-10-CM | POA: Insufficient documentation

## 2018-07-06 LAB — CBC WITH DIFFERENTIAL/PLATELET
ABS IMMATURE GRANULOCYTES: 0.14 10*3/uL — AB (ref 0.00–0.07)
BASOS PCT: 1 %
Basophils Absolute: 0.1 10*3/uL (ref 0.0–0.1)
Eosinophils Absolute: 0.2 10*3/uL (ref 0.0–0.5)
Eosinophils Relative: 1 %
HCT: 42.4 % (ref 36.0–46.0)
Hemoglobin: 12.9 g/dL (ref 12.0–15.0)
IMMATURE GRANULOCYTES: 1 %
LYMPHS PCT: 10 %
Lymphs Abs: 1.6 10*3/uL (ref 0.7–4.0)
MCH: 30.1 pg (ref 26.0–34.0)
MCHC: 30.4 g/dL (ref 30.0–36.0)
MCV: 98.8 fL (ref 80.0–100.0)
Monocytes Absolute: 0.9 10*3/uL (ref 0.1–1.0)
Monocytes Relative: 6 %
NEUTROS ABS: 13.1 10*3/uL — AB (ref 1.7–7.7)
NEUTROS PCT: 81 %
PLATELETS: 281 10*3/uL (ref 150–400)
RBC: 4.29 MIL/uL (ref 3.87–5.11)
RDW: 15.2 % (ref 11.5–15.5)
WBC: 16.1 10*3/uL — ABNORMAL HIGH (ref 4.0–10.5)
nRBC: 0 % (ref 0.0–0.2)

## 2018-07-06 LAB — URINALYSIS, COMPLETE (UACMP) WITH MICROSCOPIC
Bilirubin Urine: NEGATIVE
Glucose, UA: NEGATIVE mg/dL
Ketones, ur: NEGATIVE mg/dL
NITRITE: POSITIVE — AB
PH: 6 (ref 5.0–8.0)
Protein, ur: 30 mg/dL — AB
SPECIFIC GRAVITY, URINE: 1.015 (ref 1.005–1.030)

## 2018-07-06 LAB — BASIC METABOLIC PANEL
Anion gap: 11 (ref 5–15)
BUN: 33 mg/dL — AB (ref 8–23)
CHLORIDE: 106 mmol/L (ref 98–111)
CO2: 23 mmol/L (ref 22–32)
Calcium: 8.9 mg/dL (ref 8.9–10.3)
Creatinine, Ser: 1.37 mg/dL — ABNORMAL HIGH (ref 0.44–1.00)
GFR calc Af Amer: 43 mL/min — ABNORMAL LOW (ref >60)
GFR calc non Af Amer: 38 mL/min — ABNORMAL LOW (ref >60)
Glucose, Bld: 106 mg/dL — ABNORMAL HIGH (ref 70–99)
POTASSIUM: 5.2 mmol/L — AB (ref 3.5–5.1)
SODIUM: 140 mmol/L (ref 135–145)

## 2018-07-06 LAB — LACTIC ACID, PLASMA
LACTIC ACID, VENOUS: 1.4 mmol/L (ref 0.5–1.9)
Lactic Acid, Venous: 0.8 mmol/L (ref 0.5–1.9)

## 2018-07-06 LAB — TROPONIN I

## 2018-07-06 MED ORDER — DOCUSATE SODIUM 100 MG PO CAPS: 100.0000 mg | ORAL_CAPSULE | Freq: Two times a day (BID) | ORAL | Status: DC | PRN

## 2018-07-06 MED ORDER — SODIUM CHLORIDE 0.9 % IV SOLN
1.0000 g | Freq: Once | INTRAVENOUS | Status: AC
  Administered 2018-07-06: 1 g via INTRAVENOUS
  Filled 2018-07-06: qty 10

## 2018-07-06 MED ORDER — ATORVASTATIN CALCIUM 20 MG PO TABS
20.0000 mg | ORAL_TABLET | Freq: Every day | ORAL | Status: DC
  Administered 2018-07-06 – 2018-07-08 (×3): 20 mg via ORAL
  Filled 2018-07-06 (×3): qty 1

## 2018-07-06 MED ORDER — SODIUM CHLORIDE 0.9 % IV SOLN
INTRAVENOUS | Status: DC
  Administered 2018-07-06 – 2018-07-07 (×2): via INTRAVENOUS

## 2018-07-06 MED ORDER — PNEUMOCOCCAL VAC POLYVALENT 25 MCG/0.5ML IJ INJ
0.5000 mL | INJECTION | INTRAMUSCULAR | Status: DC
  Filled 2018-07-06: qty 0.5

## 2018-07-06 MED ORDER — APIXABAN 5 MG PO TABS
5.0000 mg | ORAL_TABLET | Freq: Two times a day (BID) | ORAL | Status: DC
  Administered 2018-07-06 – 2018-07-09 (×6): 5 mg via ORAL
  Filled 2018-07-06 (×6): qty 1

## 2018-07-06 MED ORDER — SODIUM CHLORIDE 0.9 % IV SOLN
1.0000 g | INTRAVENOUS | Status: DC
  Administered 2018-07-07 – 2018-07-08 (×2): 1 g via INTRAVENOUS
  Filled 2018-07-06: qty 1
  Filled 2018-07-06: qty 10
  Filled 2018-07-06: qty 1

## 2018-07-06 NOTE — Plan of Care (Signed)

## 2018-07-06 NOTE — ED Notes (Signed)
ED TO INPATIENT HANDOFF REPORT  Name/Age/Gender Destiny Mack 72 y.o. female  Code Status    Code Status Orders  (From admission, onward)         Start     Ordered   07/06/18 1629  Full code  Continuous     07/06/18 1628        Code Status History    Date Active Date Inactive Code Status Order ID Comments User Context   06/24/2018 1648 07/03/2018 1942 Full Code [id number]  Bettey Costa, MD Inpatient   05/20/2018 1847 05/21/2018 2212 Full Code [id number]  Nicholes Mango, MD Inpatient   05/20/2018 1847 05/20/2018 1847 Full Code [id number]  Nicholes Mango, MD Inpatient   12/31/2017 1538 01/04/2018 2130 DNR [id number]  Hillary Bow, MD ED   12/13/2017 1630 12/15/2017 2016 Full Code [id number]  Loletha Grayer, MD ED   11/22/2017 0202 11/23/2017 2009 DNR [id number]  Salary, Avel Peace, MD ED   11/22/2017 0150 11/22/2017 0202 Full Code [id number]  Salary, Avel Peace, MD ED   11/01/2017 1026 11/02/2017 0155 DNR [id number]  Max Sane, MD Inpatient   10/29/2017 1256 11/01/2017 1026 Full Code [id number]  Loletha Grayer, MD ED    Advance Directive Documentation     Most Recent Value  Type of Advance Directive  Living will, Out of facility DNR (pink MOST or yellow form)  Pre-existing out of facility DNR order (yellow form or pink MOST form)  -  "MOST" Form in Place?  -      Home/SNF/Other Home  Chief Complaint Syncopal Episode  Level of Care/Admitting Diagnosis ED Disposition    ED Disposition Condition [place]: [facility] [100120]  Level of Care: Med-Surg [16]  Diagnosis: UTI (urinary tract infection) [311216]  Admitting Physician: Vaughan Basta [phone]  Attending Physician: Vaughan Basta [phone]  PT Class (Do Not Modify): Observation [104]  PT Acc Code (Do Not Modify): Observation [10022]       Medical History Past Medical History:  Diagnosis Date  . A-fib ([place])   . Depression   . Frequent falls   . Hypertension   . Leaking  of urine   . Mixed incontinence   . Stroke ([place])   . Tremor of hands and face     Allergies No Known Allergies  IV Location/Drains/Wounds Patient Lines/Drains/Airways Status   Active Line/Drains/Airways    Name:   Placement date:   Placement time:   Site:   Days:   Peripheral IV 07/06/18 Right Hand   07/06/18    1200    Hand   less than 1   Peripheral IV 07/06/18 Left Hand   07/06/18    1200    Hand   less than 1   External Urinary Catheter   07/01/18    1900    -   5   Pressure Injury 06/28/18 Stage II -  Partial thickness loss of dermis presenting as a shallow open ulcer with a red, pink wound bed without slough.   06/28/18    0800     8          Labs/Imaging Results for orders placed or performed during the hospital encounter of 07/06/18 (from the past 48 hour(s))  CBC with Differential     Status: Abnormal   Collection Time: 07/06/18 12:00 PM  Result Value Ref Range   WBC 16.1 (H) 4.0 - 10.5 K/uL   RBC 4.29 3.87 - 5.11 MIL/uL  Hemoglobin 12.9 12.0 - 15.0 g/dL   HCT 42.4 36.0 - 46.0 %   MCV 98.8 80.0 - 100.0 fL   MCH 30.1 26.0 - 34.0 pg   MCHC 30.4 30.0 - 36.0 g/dL   RDW 15.2 11.5 - 15.5 %   Platelets 281 150 - 400 K/uL   nRBC 0.0 0.0 - 0.2 %   Neutrophils Relative % 81 %   Neutro Abs 13.1 (H) 1.7 - 7.7 K/uL   Lymphocytes Relative 10 %   Lymphs Abs 1.6 0.7 - 4.0 K/uL   Monocytes Relative 6 %   Monocytes Absolute 0.9 0.1 - 1.0 K/uL   Eosinophils Relative 1 %   Eosinophils Absolute 0.2 0.0 - 0.5 K/uL   Basophils Relative 1 %   Basophils Absolute 0.1 0.0 - 0.1 K/uL   Immature Granulocytes 1 %   Abs Immature Granulocytes 0.14 (H) 0.00 - 0.07 K/uL    Comment: Performed at [facility], [address] [place], [place] [address]  Basic metabolic panel     Status: Abnormal   Collection Time: 07/06/18 12:00 PM  Result Value Ref Range   Sodium 140 135 - 145 mmol/L   Potassium 5.2 (H) 3.5 - 5.1 mmol/L    Comment: HEMOLYSIS AT THIS LEVEL MAY AFFECT RESULT    Chloride 106 98 - 111 mmol/L   CO2 23 22 - 32 mmol/L   Glucose, Bld 106 (H) 70 - 99 mg/dL   BUN 33 (H) 8 - 23 mg/dL   Creatinine, Ser 1.37 (H) 0.44 - 1.00 mg/dL   Calcium 8.9 8.9 - 10.3 mg/dL   GFR calc non Af Amer 38 (L) >60 mL/min   GFR calc Af Amer 43 (L) >60 mL/min    Comment: (NOTE) The eGFR has been calculated using the CKD EPI equation. This calculation has not been validated in all clinical situations. eGFR's persistently <60 mL/min signify possible Chronic Kidney Disease.    Anion gap 11 5 - 15    Comment: Performed at [facility], [address] [place], [place] [address]  Troponin I     Status: None   Collection Time: 07/06/18 12:00 PM  Result Value Ref Range   Troponin I <0.03 <0.03 ng/mL    Comment: Performed at [facility], [address] [place], [place] [address]  Urinalysis, Complete w Microscopic     Status: Abnormal   Collection Time: 07/06/18  1:16 PM  Result Value Ref Range   Color, Urine YELLOW (A) YELLOW   APPearance CLOUDY (A) CLEAR   Specific Gravity, Urine 1.015 1.005 - 1.030   pH 6.0 5.0 - 8.0   Glucose, UA NEGATIVE NEGATIVE mg/dL   Hgb urine dipstick SMALL (A) NEGATIVE   Bilirubin Urine NEGATIVE NEGATIVE   Ketones, ur NEGATIVE NEGATIVE mg/dL   Protein, ur 30 (A) NEGATIVE mg/dL   Nitrite POSITIVE (A) NEGATIVE   Leukocytes, UA LARGE (A) NEGATIVE   RBC / HPF 21-50 0 - 5 RBC/hpf   WBC, UA >50 (H) 0 - 5 WBC/hpf   Bacteria, UA RARE (A) NONE SEEN   Squamous Epithelial / LPF 0-5 0 - 5   WBC Clumps PRESENT     Comment: Performed at [facility], [address], [place], [place] [address]   Dg Chest Port 1 View  Result Date: 07/06/2018 CLINICAL DATA:  Syncope, possible seizure EXAM: PORTABLE CHEST 1 VIEW COMPARISON:  07/02/2018 FINDINGS: The patient is rotated to the left on today's radiograph, reducing diagnostic  sensitivity and specificity. The lungs appear clear. Old right rib and left clavicular fractures with  resulting deformity. Bony demineralization. Heart size is within normal limits compensating for the degree of leftward rotation. IMPRESSION: 1.  No active cardiopulmonary disease is radiographically apparent. 2. Old right rib and left clavicular deformities. Electronically Signed   By: Van Clines M.D.   On: 07/06/2018 13:09    Pending Labs Unresulted Labs (From admission, onward)    Start     Ordered   07/07/18 3838  Basic metabolic panel  Tomorrow morning,   STAT     07/06/18 1628   07/07/18 0500  CBC  Tomorrow morning,   STAT     07/06/18 1628   07/06/18 1622  Culture, blood (single) w Reflex to ID Panel  Once,   STAT     07/06/18 1621   07/06/18 1515  Urine culture  Add-on,   AD     07/06/18 1514   07/06/18 1515  Lactic acid, plasma  Now then every 2 hours,   STAT     07/06/18 1514   07/06/18 1514  Culture, blood (routine x 2)  BLOOD CULTURE X 2,   STAT     07/06/18 1514          Vitals/Pain Today's Vitals   07/06/18 1156 07/06/18 1600 07/06/18 1630 07/06/18 1649  BP:  (!) 148/60 (!) 149/63   Pulse:    72  Resp: _0 Temp: (!) 97.4 F (36.3 C)     TempSrc: Oral     SpO2:    99%  Weight: 62.6 kg     Height: _1  (1.626 m)     PainSc: 0-No pain       Isolation Precautions No active isolations  Medications Medications  atorvastatin (LIPITOR) tablet 20 mg (has no administration in time range)  apixaban (ELIQUIS) tablet 5 mg (has no administration in time range)  docusate sodium (COLACE) capsule 100 mg (has no administration in time range)  cefTRIAXone (ROCEPHIN) 1 g in sodium chloride 0.9 % 100 mL IVPB (has no administration in time range)  0.9 %  sodium chloride infusion ( Intravenous New Bag/Given 07/06/18 1648)  cefTRIAXone (ROCEPHIN) 1 g in sodium chloride 0.9 % 100 mL IVPB (0 g Intravenous Stopped 07/06/18 1642)    Mobility walks with person assist

## 2018-07-06 NOTE — ED Notes (Signed)
Talked to [facility] in lab for someone to draw labs on pt. Pt stuck multiple times to start IVs however blood return slow or nonexistent. Z

## 2018-07-06 NOTE — ED Provider Notes (Deleted)
Entered in error.    Jeanmarie Plant, MD 07/06/18 [phone]

## 2018-07-06 NOTE — H&P (Signed)
[facility] [place] [facility]   PATIENT NAME: Destiny Mack    MR#:  [id number]  DATE OF BIRTH:  1945/11/28  DATE OF ADMISSION:  07/06/2018  PRIMARY CARE PHYSICIAN: Oswaldo Conroy, MD   REQUESTING/REFERRING PHYSICIAN: Lord  CHIEF COMPLAINT:   Chief Complaint  Patient presents with  . Loss of Consciousness    HISTORY OF PRESENT ILLNESS: Destiny Mack  is a 72 y.o. female with a known history of A. fib, frequent falls, hypertension, stroke-was admitted to hospital last week with sepsis and UTI, intubated and finally extubated.  Sent home with home health 3 days ago for 3 days of oral antibiotic as per the urine culture reports. While they were trying to transfer her to the wheelchair she had possibly a syncopal episode and was very weak after that.  Concerned with this she was brought to emergency room.  She was noted to have UTI again with elevated white blood cell count and also clinically appears dehydrated. I had asked the patient, she was able to tell me the year and the place where she is.  But she tells me that she fell off the stairs-18 steps.  But did not injure or hurt herself anywhere and that is why she is in the emergency room. Tried calling patient's daughter number listed in the chart, and could not get in touch with her to get clear history.  PAST MEDICAL HISTORY:   Past Medical History:  Diagnosis Date  . A-fib ([facility])   . Depression   . Frequent falls   . Hypertension   . Leaking of urine   . Mixed incontinence   . Stroke ([facility])   . Tremor of hands and face     PAST SURGICAL HISTORY:  Past Surgical History:  Procedure Laterality Date  . APPENDECTOMY    . TEE WITHOUT CARDIOVERSION N/A 01/04/2018   Procedure: TRANSESOPHAGEAL ECHOCARDIOGRAM (TEE);  Surgeon: Lamar Blinks, MD;  Location: [facility] ORS;  Service: Cardiovascular;  Laterality: N/A;  . TUBAL LIGATION      SOCIAL HISTORY:  Social History   Tobacco Use  . Smoking status:  Current Every Day Smoker    Packs/day: 2.00    Years: 56.00    Pack years: 112.00    Last attempt to quit: 11/22/2017    Years since quitting: 0.6  . Smokeless tobacco: Never Used  Substance Use Topics  . Alcohol use: No    Frequency: Never    FAMILY HISTORY:  Family History  Problem Relation Age of Onset  . Dementia Mother   . Heart failure Mother   . Heart failure Father   . CAD Father     DRUG ALLERGIES: No Known Allergies  REVIEW OF SYSTEMS:   CONSTITUTIONAL: No fever, have fatigue or weakness.  EYES: No blurred or double vision.  EARS, NOSE, AND THROAT: No tinnitus or ear pain.  RESPIRATORY: No cough, shortness of breath, wheezing or hemoptysis.  CARDIOVASCULAR: No chest pain, orthopnea, edema.  GASTROINTESTINAL: No nausea, vomiting, diarrhea or abdominal pain.  GENITOURINARY: No dysuria, hematuria.  ENDOCRINE: No polyuria, nocturia,  HEMATOLOGY: No anemia, easy bruising or bleeding SKIN: No rash or lesion. MUSCULOSKELETAL: No joint pain or arthritis.   NEUROLOGIC: No tingling, numbness, weakness.  PSYCHIATRY: No anxiety or depression.   MEDICATIONS AT HOME:  Prior to Admission medications   Medication Sig Start Date End Date Taking? Authorizing Provider  amLODipine (NORVASC) 5 MG tablet Take 5 mg by mouth daily.  Yes [provider]  apixaban (ELIQUIS) 5 MG TABS tablet Take 1 tablet (5 mg total) by mouth 2 (two) times daily. 12/15/17 07/06/18 Yes Pyreddy, Vivien Rota, MD  atorvastatin (LIPITOR) 20 MG tablet Take 20 mg by mouth at bedtime.    Yes [provider]  cefUROXime (CEFTIN) 500 MG tablet Take 1 tablet (500 mg total) by mouth 2 (two) times daily for 3 days. 07/03/18 07/06/18 Yes Auburn Bilberry, MD      PHYSICAL EXAMINATION:   VITAL SIGNS: Blood pressure (!) 148/60, temperature (!) 97.4 F (36.3 C), temperature source Oral, resp. rate 18, height 5\' 4"  (1.626 m), weight 62.6 kg.  GENERAL:  72 y.o.-year-old patient lying in the bed with no  acute distress.  EYES: Pupils equal, round, reactive to light and accommodation. No scleral icterus. Extraocular muscles intact.  HEENT: Head atraumatic, normocephalic. Oropharynx and nasopharynx clear.  NECK:  Supple, no jugular venous distention. No thyroid enlargement, no tenderness.  LUNGS: Normal breath sounds bilaterally, no wheezing, rales,rhonchi or crepitation. No use of accessory muscles of respiration.  CARDIOVASCULAR: S1, S2 normal. No murmurs, rubs, or gallops.  ABDOMEN: Soft, nontender, nondistended. Bowel sounds present. No organomegaly or mass.  EXTREMITIES: No pedal edema, cyanosis, or clubbing.  NEUROLOGIC: Cranial nerves II through XII are intact. Muscle strength 3-4/5 in all extremities. Sensation intact. Gait not checked.  PSYCHIATRIC: The patient is alert and oriented x 2.  SKIN: No obvious rash, lesion, or ulcer.   LABORATORY PANEL:   CBC Recent Labs  Lab 06/30/18 0429 07/01/18 0503 07/06/18 1200  WBC 9.5 8.6 16.1*  HGB 9.0* 9.4* 12.9  HCT 29.3* 30.1* 42.4  PLT 190 190 281  MCV 98.7 98.4 98.8  MCH 30.3 30.7 30.1  MCHC 30.7 31.2 30.4  RDW 14.9 14.5 15.2  LYMPHSABS 1.1 1.0 1.6  MONOABS 0.8 0.8 0.9  EOSABS 0.5 0.4 0.2  BASOSABS 0.1 0.1 0.1   ------------------------------------------------------------------------------------------------------------------  Chemistries  Recent Labs  Lab 06/30/18 0429 07/01/18 0503 07/02/18 0649 07/06/18 1200  NA 142 139 143 140  K 3.8 4.5 4.0 5.2*  CL 109 105 111 106  CO2 23 23 27 23   GLUCOSE 76 75 101* 106*  BUN 31* 25* 21 33*  CREATININE 1.23* 1.28* 1.20* 1.37*  CALCIUM 8.9 8.8* 8.9 8.9  MG  --  2.0 2.0  --    ------------------------------------------------------------------------------------------------------------------ estimated creatinine clearance is 32.1 mL/min (A) (by C-G formula based on SCr of 1.37 mg/dL  (H)). ------------------------------------------------------------------------------------------------------------------ No results for input(s): TSH, T4TOTAL, T3FREE, THYROIDAB in the last 72 hours.  Invalid input(s): FREET3   Coagulation profile No results for input(s): INR, PROTIME in the last 168 hours. ------------------------------------------------------------------------------------------------------------------- No results for input(s): DDIMER in the last 72 hours. -------------------------------------------------------------------------------------------------------------------  Cardiac Enzymes Recent Labs  Lab 07/06/18 1200  TROPONINI <0.03   ------------------------------------------------------------------------------------------------------------------ Invalid input(s): POCBNP  ---------------------------------------------------------------------------------------------------------------  Urinalysis    Component Value Date/Time   COLORURINE YELLOW (A) 07/06/2018 1316   APPEARANCEUR CLOUDY (A) 07/06/2018 1316   LABSPEC 1.015 07/06/2018 1316   PHURINE 6.0 07/06/2018 1316   GLUCOSEU NEGATIVE 07/06/2018 1316   HGBUR SMALL (A) 07/06/2018 1316   BILIRUBINUR NEGATIVE 07/06/2018 1316   KETONESUR NEGATIVE 07/06/2018 1316   PROTEINUR 30 (A) 07/06/2018 1316   NITRITE POSITIVE (A) 07/06/2018 1316   LEUKOCYTESUR LARGE (A) 07/06/2018 1316     RADIOLOGY: Dg Chest Port 1 View  Result Date: 07/06/2018 CLINICAL DATA:  Syncope, possible seizure EXAM: PORTABLE CHEST 1 VIEW COMPARISON:  07/02/2018 FINDINGS: The patient  is rotated to the left on today's radiograph, reducing diagnostic sensitivity and specificity. The lungs appear clear. Old right rib and left clavicular fractures with resulting deformity. Bony demineralization. Heart size is within normal limits compensating for the degree of leftward rotation. IMPRESSION: 1.  No active cardiopulmonary disease is radiographically  apparent. 2. Old right rib and left clavicular deformities. Electronically Signed   By: Gaylyn Rong M.D.   On: 07/06/2018 13:09    EKG: Orders placed or performed during the hospital encounter of 07/06/18  . ED EKG  . ED EKG    IMPRESSION AND PLAN:  *Acute UTI-recurrent. This is third episode in the last 6 weeks. Patient was just treated for UTI and finished antibiotics. I will start Rocephin as per the urine culture reports from last week. Sent blood cultures. Ultrasound renal to rule out any pathologies If it does not give any idea then we may need to have CT scan renal stone studies to rule out pathologists and get urologist consult due to recurrent UTI.  *Dehydration Patient has dry tongue and slight worsening in the renal function I will give IV fluids and monitor. Hold antihypertensive medications.  *Hyperkalemia Monitor with IV fluids.  *Atrial fibrillation-paroxysmal. Continue Eliquis.  *Chronic kidney disease stage III Continue to monitor.  IV fluids for now.  All the records are reviewed and case discussed with ED provider. Management plans discussed with the patient, family and they are in agreement.  CODE STATUS: Full code. Code Status History    Date Active Date Inactive Code Status Order ID Comments User Context   06/24/2018 1648 07/03/2018 1942 Full Code [id number]  Adrian Saran, MD Inpatient   05/20/2018 1847 05/21/2018 2212 Full Code [id number]  Ramonita Lab, MD Inpatient   05/20/2018 1847 05/20/2018 1847 Full Code [id number]  Ramonita Lab, MD Inpatient   12/31/2017 1538 01/04/2018 2130 DNR [id number]  Milagros Loll, MD ED   12/13/2017 1630 12/15/2017 2016 Full Code [id number]  Alford Highland, MD ED   11/22/2017 0202 11/23/2017 2009 DNR [id number]  Salary, Evelena Asa, MD ED   11/22/2017 0150 11/22/2017 0202 Full Code [id number]  Salary, Evelena Asa, MD ED   11/01/2017 1026 11/02/2017 0155 DNR [id number]  Delfino Lovett, MD Inpatient   10/29/2017 1256 11/01/2017 1026 Full Code  [id number]  Alford Highland, MD ED    Advance Directive Documentation     Most Recent Value  Type of Advance Directive  Living will, Out of facility DNR (pink MOST or yellow form)  Pre-existing out of facility DNR order (yellow form or pink MOST form)  -  "MOST" Form in Place?  -       TOTAL TIME TAKING CARE OF THIS PATIENT: 45 minutes.    Altamese Dilling M.D on 07/06/2018   Between 7am to 6pm - Pager - [phone]  After 6pm go to [email or web address] - password [email or web address]  [facility]  Office  [phone]  CC: Primary care physician; Oswaldo Conroy, MD   Note: This dictation was prepared with Dragon dictation along with smaller phrase technology. Any transcriptional errors that result from this process are unintentional.

## 2018-07-06 NOTE — ED Notes (Signed)
Destiny Mack from lab unable to draw blood for cultures/lactic acid. Admitting MD made aware and wants to give fluid and attempt to draw labs afterwards. Will administer antibiotics as ordered to avoid further delay.

## 2018-07-06 NOTE — ED Notes (Addendum)
Unsuccessful IV attempt x3. Will get Brandy RN to attempt.

## 2018-07-06 NOTE — ED Provider Notes (Addendum)
[facility] Emergency Department Provider Note ____________________________________________   I have reviewed the triage vital signs and the triage nursing note.  HISTORY  Chief Complaint Loss of Consciousness   Historian Patient  HPI Destiny Mack is a 72 y.o. female with a history of TIA and A. fib per EMS report, presents from home after syncope versus seizure.  It was reported by EMS that she was sitting upright in the wheelchair and then was not responding for about a minute.  It sounds like she had urinated and defecated on herself.  Denies tongue biting.  Patient herself does not recall anything before that episode including no chest pain before after, no trouble breathing or shortness of breath, and has no headache, weakness or numbness.  Sounds like she had been diagnosed with possible seizure when she was in the hospital per EMS report, but has never been placed on medication for that.      Past Medical History:  Diagnosis Date  . A-fib (HCC)   . Depression   . Frequent falls   . Hypertension   . Leaking of urine   . Mixed incontinence   . Stroke (HCC)   . Tremor of hands and face     Patient Active Problem List   Diagnosis Date Noted  . Sepsis (HCC) 06/24/2018  . CVA (cerebral vascular accident) (HCC) 05/20/2018  . Malnutrition of moderate degree 01/01/2018  . Pneumonia 12/31/2017  . Hypotension 12/13/2017  . Diarrhea 11/22/2017  . Campylobacter diarrhea 11/22/2017  . Acute encephalopathy 10/29/2017  . Pressure injury of skin 10/29/2017    Past Surgical History:  Procedure Laterality Date  . APPENDECTOMY    . TEE WITHOUT CARDIOVERSION N/A 01/04/2018   Procedure: TRANSESOPHAGEAL ECHOCARDIOGRAM (TEE);  Surgeon: Lamar Blinks, MD;  Location: [facility] ORS;  Service: Cardiovascular;  Laterality: N/A;  . TUBAL LIGATION      Prior to Admission medications   Medication Sig Start Date End Date Taking? Authorizing Provider  amLODipine  (NORVASC) 5 MG tablet Take 5 mg by mouth daily.   Yes [provider]  apixaban (ELIQUIS) 5 MG TABS tablet Take 1 tablet (5 mg total) by mouth 2 (two) times daily. 12/15/17 07/06/18 Yes Pyreddy, Vivien Rota, MD  atorvastatin (LIPITOR) 20 MG tablet Take 20 mg by mouth at bedtime.    Yes [provider]  cefUROXime (CEFTIN) 500 MG tablet Take 1 tablet (500 mg total) by mouth 2 (two) times daily for 3 days. 07/03/18 07/06/18 Yes Auburn Bilberry, MD    No Known Allergies  Family History  Problem Relation Age of Onset  . Dementia Mother   . Heart failure Mother   . Heart failure Father   . CAD Father     Social History Social History   Tobacco Use  . Smoking status: Current Every Day Smoker    Packs/day: 2.00    Years: 56.00    Pack years: 112.00    Last attempt to quit: 11/22/2017    Years since quitting: 0.6  . Smokeless tobacco: Never Used  Substance Use Topics  . Alcohol use: No    Frequency: Never  . Drug use: No    Review of Systems  Constitutional: Negative for fever. Eyes: Negative for visual changes. ENT: Negative for sore throat. Cardiovascular: Negative for chest pain. Respiratory: Negative for shortness of breath. Gastrointestinal: Negative for abdominal pain, vomiting and diarrhea. Genitourinary: Negative for dysuria. Musculoskeletal: Negative for back pain. Skin: Negative for rash. Neurological: Negative for  headache.  ____________________________________________   PHYSICAL EXAM:  VITAL SIGNS: ED Triage Vitals [07/06/18 1156]  Enc Vitals Group     BP      Pulse      Resp 16     Temp (!) 97.4 F (36.3 C)     Temp Source Oral     SpO2      Weight 138 lb (62.6 kg)     Height 5\' 4"  (1.626 m)     Head Circumference      Peak Flow      Pain Score 0     Pain Loc      Pain Edu?      Excl. in GC?      Constitutional: Alert and oriented.  HEENT      Head: Normocephalic and atraumatic.      Eyes: Conjunctivae are normal. Pupils equal and  round.       Ears:         Nose: No congestion/rhinnorhea.      Mouth/Throat: Mucous membranes are mildly dry.      Neck: No stridor. Cardiovascular/Chest: Normal rate, regular rhythm.  No murmurs, rubs, or gallops. Respiratory: Normal respiratory effort without tachypnea nor retractions. Breath sounds are clear and equal bilaterally. No wheezes/rales/rhonchi. Gastrointestinal: Soft. No distention, no guarding, no rebound. Nontender.  Obese Genitourinary/rectal:Deferred Musculoskeletal: Nontender with normal range of motion in all extremities. No joint effusions.  No lower extremity tenderness.  No edema. Neurologic:  Normal speech and language. No gross or focal neurologic deficits are appreciated. Skin:  Skin is warm, dry and intact. No rash noted. Psychiatric: Mood and affect are normal. Speech and behavior are normal. Patient exhibits appropriate insight and judgment.   ____________________________________________  LABS (pertinent positives/negatives) I, Governor Rooks, MD the attending physician have reviewed the labs noted below.  Labs Reviewed  CBC WITH DIFFERENTIAL/PLATELET - Abnormal; Notable for the following components:      Result Value   WBC 16.1 (*)    Neutro Abs 13.1 (*)    Abs Immature Granulocytes 0.14 (*)    All other components within normal limits  URINALYSIS, COMPLETE (UACMP) WITH MICROSCOPIC - Abnormal; Notable for the following components:   Color, Urine YELLOW (*)    APPearance CLOUDY (*)    Hgb urine dipstick SMALL (*)    Protein, ur 30 (*)    Nitrite POSITIVE (*)    Leukocytes, UA LARGE (*)    WBC, UA >50 (*)    Bacteria, UA RARE (*)    All other components within normal limits  BASIC METABOLIC PANEL - Abnormal; Notable for the following components:   Potassium 5.2 (*)    Glucose, Bld 106 (*)    BUN 33 (*)    Creatinine, Ser 1.37 (*)    GFR calc non Af Amer 38 (*)    GFR calc Af Amer 43 (*)    All other components within normal limits  CULTURE,  BLOOD (ROUTINE X 2)  CULTURE, BLOOD (ROUTINE X 2)  URINE CULTURE  TROPONIN I  LACTIC ACID, PLASMA  LACTIC ACID, PLASMA    ____________________________________________    EKG I, Governor Rooks, MD, the attending physician have personally viewed and interpreted all ECGs.  66 bpm.  Normal sinus rhythm.  Narrow QRS.  Normal axis.  ST segment depression inferiorly and T wave inversions inferiorly and laterally  These are new changes from prior EKG June 26, 2018 ____________________________________________  RADIOLOGY   Chest x-ray portable, viewed by myself,  radiologist interpretation:  IMPRESSION: 1.  No active cardiopulmonary disease is radiographically apparent. 2. Old right rib and left clavicular deformities. __________________________________________  PROCEDURES  Procedure(s) performed: None  Procedures  Critical Care performed: None   ____________________________________________  ED COURSE / ASSESSMENT AND PLAN  Pertinent labs & imaging results that were available during my care of the patient were reviewed by me and considered in my medical decision making (see chart for details).    Patient presents after an episode of altered mental status which was either syncope versus seizure although there is no seizure activity noted, she did lose continence of both bowel and bladder.  Sound like it was self-limited and she is now feeling back to baseline.  Patient is not reporting any cardiac or neurologic symptoms before or after.  She states that she feels fine now.  Her EKG is somewhat abnormal and is actually different from prior when she was just recently in the hospital.  I reviewed the hospital records including the discharge summary for which patient had been admitted for altered mental status secondary to sepsis with urinary tract infection.  She had been intubated initially for airway protection with altered mental status at that point time.   Urinalysis  showing nitrite positive UTI.  Elevated white blood cell count.  She does not have a fever or hypotension or tachycardia, have not placed on sepsis pathway at this point time.  I am covering with broad-spectrum antibiotics given recent UTI sepsis episode.  Blood cultures and urine culture sent.    CONSULTATIONS: Hospitalist for admission   Patient / Family / Caregiver informed of clinical course, medical decision-making process, and agree with plan.     ___________________________________________   FINAL CLINICAL IMPRESSION(S) / ED DIAGNOSES   Final diagnoses:  AKI (acute kidney injury) (HCC)  Urinary tract infection with hematuria, site unspecified  Syncope, unspecified syncope type      ___________________________________________         Note: This dictation was prepared with Dragon dictation. Any transcriptional errors that result from this process are unintentional    Governor Rooks, MD 07/06/18 1516    Governor Rooks, MD 07/06/18 1530

## 2018-07-06 NOTE — ED Triage Notes (Signed)
Pt arrives via ems from home, family reported to EMS that she was moved from her bed to w/c and had a syncopal episode, family also reported to ems that she has a hx of seizures and may have had a seizure, pt experienced loc while sitting in the w/c and lost bowel continence

## 2018-07-07 LAB — CBC
HEMATOCRIT: 32.6 % — AB (ref 36.0–46.0)
Hemoglobin: 10.1 g/dL — ABNORMAL LOW (ref 12.0–15.0)
MCH: 30.1 pg (ref 26.0–34.0)
MCHC: 31 g/dL (ref 30.0–36.0)
MCV: 97 fL (ref 80.0–100.0)
NRBC: 0 % (ref 0.0–0.2)
PLATELETS: 231 10*3/uL (ref 150–400)
RBC: 3.36 MIL/uL — AB (ref 3.87–5.11)
RDW: 15 % (ref 11.5–15.5)
WBC: 11.1 10*3/uL — AB (ref 4.0–10.5)

## 2018-07-07 LAB — BASIC METABOLIC PANEL
ANION GAP: 5 (ref 5–15)
BUN: 29 mg/dL — ABNORMAL HIGH (ref 8–23)
CALCIUM: 8.4 mg/dL — AB (ref 8.9–10.3)
CHLORIDE: 110 mmol/L (ref 98–111)
CO2: 26 mmol/L (ref 22–32)
Creatinine, Ser: 1.23 mg/dL — ABNORMAL HIGH (ref 0.44–1.00)
GFR calc Af Amer: 50 mL/min — ABNORMAL LOW (ref >60)
GFR calc non Af Amer: 43 mL/min — ABNORMAL LOW (ref >60)
Glucose, Bld: 94 mg/dL (ref 70–99)
Potassium: 4.1 mmol/L (ref 3.5–5.1)
Sodium: 141 mmol/L (ref 135–145)

## 2018-07-07 MED ORDER — GUAIFENESIN ER 600 MG PO TB12
600.0000 mg | ORAL_TABLET | Freq: Two times a day (BID) | ORAL | Status: DC
  Administered 2018-07-07 – 2018-07-09 (×4): 600 mg via ORAL
  Filled 2018-07-07 (×4): qty 1

## 2018-07-07 MED ORDER — IPRATROPIUM-ALBUTEROL 0.5-2.5 (3) MG/3ML IN SOLN: 3.0000 mL | RESPIRATORY_TRACT | Status: DC | PRN

## 2018-07-07 NOTE — Plan of Care (Signed)

## 2018-07-07 NOTE — Care Management Note (Signed)
Case Management Note  Patient Details  Name: Destiny Mack MRN: [id number] Date of Birth: 12-29-1945  Subjective/Objective:    Patient admitted to [facility] under observation status for syncope. RNCM consulted on patient to provide MOON letter and complete assessment. Spoke primarily with daughter Natalia Leatherwood [phone] as patient orientation fluctuates.  Patient lives at home with daughter.  PCP Bender.  Daughter transports to appointments, or utilizes [employer] transportation. Daughter states that patient receives 56 hours a week from [facility] services.  Patient has hospital bed, WC, and BSC in home as well as newly ordered hoyer lift. Patient and family have declined SNF placement during previous admissions. Patient open to [facility] for home health. [name] notified of admission.                 Action/Plan:   Expected Discharge Date:                  Expected Discharge Plan:     In-House Referral:     Discharge planning Services     Post Acute Care Choice:    Choice offered to:     DME Arranged:    DME Agency:     HH Arranged:    HH Agency:     Status of Service:     If discussed at [employer] of Stay Meetings, dates discussed:    Additional Comments:  Virgel Manifold, RN 07/07/2018, 8:57 AM

## 2018-07-07 NOTE — Plan of Care (Signed)
  Problem: Education: Goal: Knowledge of General Education information will improve Description: Including pain rating scale, medication(s)/side effects and non-pharmacologic comfort measures Outcome: Progressing   Problem: Health Behavior/Discharge Planning: Goal: Ability to manage health-related needs will improve Outcome: Progressing   Problem: Clinical Measurements: Goal: Ability to maintain clinical measurements within normal limits will improve Outcome: Progressing Goal: Will remain free from infection Outcome: Progressing Goal: Diagnostic test results will improve Outcome: Progressing Goal: Respiratory complications will improve Outcome: Progressing Goal: Cardiovascular complication will be avoided Outcome: Progressing   Problem: Coping: Goal: Level of anxiety will decrease Outcome: Progressing   Problem: Elimination: Goal: Will not experience complications related to bowel motility Outcome: Progressing Goal: Will not experience complications related to urinary retention Outcome: Progressing   Problem: Pain Managment: Goal: General experience of comfort will improve Outcome: Progressing   Problem: Safety: Goal: Ability to remain free from injury will improve Outcome: Progressing   

## 2018-07-07 NOTE — Progress Notes (Addendum)
[employer] [facility] - [place] at [facility]   PATIENT NAME: Destiny Mack    MR#:  [id number]  DATE OF BIRTH:  02/05/1946  SUBJECTIVE:  CHIEF COMPLAINT:   Chief Complaint  Patient presents with  . Loss of Consciousness  Patient without complaint, daughter at the bedside-daughter is concerned for seizures given episodes of unresponsiveness at home with shaking/jerking movements  REVIEW OF SYSTEMS:  CONSTITUTIONAL: No fever, fatigue or weakness.  EYES: No blurred or double vision.  EARS, NOSE, AND THROAT: No tinnitus or ear pain.  RESPIRATORY: No cough, shortness of breath, wheezing or hemoptysis.  CARDIOVASCULAR: No chest pain, orthopnea, edema.  GASTROINTESTINAL: No nausea, vomiting, diarrhea or abdominal pain.  GENITOURINARY: No dysuria, hematuria.  ENDOCRINE: No polyuria, nocturia,  HEMATOLOGY: No anemia, easy bruising or bleeding SKIN: No rash or lesion. MUSCULOSKELETAL: No joint pain or arthritis.   NEUROLOGIC: No tingling, numbness, weakness.  PSYCHIATRY: No anxiety or depression.   ROS  DRUG ALLERGIES:  No Known Allergies  VITALS:  Blood pressure (!) 134/53, pulse 76, temperature 98.5 F (36.9 C), temperature source Oral, resp. rate 16, height 5\' 3"  (1.6 m), weight 34.9 kg, SpO2 99 %.  PHYSICAL EXAMINATION:  GENERAL:  72 y.o.-year-old patient lying in the bed with no acute distress.  EYES: Pupils equal, round, reactive to light and accommodation. No scleral icterus. Extraocular muscles intact.  HEENT: Head atraumatic, normocephalic. Oropharynx and nasopharynx clear.  NECK:  Supple, no jugular venous distention. No thyroid enlargement, no tenderness.  LUNGS: Normal breath sounds bilaterally, no wheezing, rales,rhonchi or crepitation. No use of accessory muscles of respiration.  CARDIOVASCULAR: S1, S2 normal. No murmurs, rubs, or gallops.  ABDOMEN: Soft, nontender, nondistended. Bowel sounds present. No organomegaly or mass.  EXTREMITIES: No pedal edema,  cyanosis, or clubbing.  NEUROLOGIC: Cranial nerves II through XII are intact. Muscle strength 5/5 in all extremities. Sensation intact. Gait not checked.  PSYCHIATRIC: The patient is alert and oriented x 3.  SKIN: No obvious rash, lesion, or ulcer.   Physical Exam LABORATORY PANEL:   CBC Recent Labs  Lab 07/07/18 0515  WBC 11.1*  HGB 10.1*  HCT 32.6*  PLT 231   ------------------------------------------------------------------------------------------------------------------  Chemistries  Recent Labs  Lab 07/02/18 0649  07/07/18 0515  NA 143   < > 141  K 4.0   < > 4.1  CL 111   < > 110  CO2 27   < > 26  GLUCOSE 101*   < > 94  BUN 21   < > 29*  CREATININE 1.20*   < > 1.23*  CALCIUM 8.9   < > 8.4*  MG 2.0  --   --    < > = values in this interval not displayed.   ------------------------------------------------------------------------------------------------------------------  Cardiac Enzymes Recent Labs  Lab 07/06/18 1200  TROPONINI <0.03   ------------------------------------------------------------------------------------------------------------------  RADIOLOGY:  US Renal  Result Date: 07/06/2018 CLINICAL DATA:  Recurrent urinary tract infection. EXAM: RENAL / URINARY TRACT ULTRASOUND COMPLETE COMPARISON:  None. FINDINGS: Right Kidney: Renal measurements: 6.6 x 2.4 x 2.9 cm = volume: 24 mL. Kidney is small with cortical thinning. No mass or hydronephrosis. Left Kidney: Renal measurements: 9.4 x 5.4 x 4.3 cm = volume: 116 mL. Echogenicity within normal limits. No mass or hydronephrosis visualized. Bladder: Appears normal for degree of bladder distention. IMPRESSION: Small RIGHT kidney with cortical thinning. No mass or hydronephrosis. Normal LEFT kidney. Unremarkable bladder. Electronically Signed   By: Elsie Stain M.D.   On: 07/06/2018 17:28  Dg Chest Port 1 View  Result Date: 07/06/2018 CLINICAL DATA:  Syncope, possible seizure EXAM: PORTABLE CHEST 1 VIEW  COMPARISON:  07/02/2018 FINDINGS: The patient is rotated to the left on today's radiograph, reducing diagnostic sensitivity and specificity. The lungs appear clear. Old right rib and left clavicular fractures with resulting deformity. Bony demineralization. Heart size is within normal limits compensating for the degree of leftward rotation. IMPRESSION: 1.  No active cardiopulmonary disease is radiographically apparent. 2. Old right rib and left clavicular deformities. Electronically Signed   By: Gaylyn Rong M.D.   On: 07/06/2018 13:09    ASSESSMENT AND PLAN:  *Acute recurrent UTI Urine culture from June 27, 2018 noted for E. coli Continue empiric Rocephin, follow-up on current urine culture This is third episode in the last 6 weeks -may require prophylactic antibiotics, suggest urology follow-up as an outpatient status post discharge for further evaluation/management  *Episodes of unresponsiveness with jerking movements Noted by patient's daughter status post seizure while at home, daughter concern for seizures Neurochecks while in house, aspiration/fall/seizure precautions, neurology to see, check EEG, and continue close medical monitoring  *Hx of CVA Stable Increase nursing care PRN, aspiration/fall/skin care precautions while in house, on Eliquis, statin therapy, ST to see  *Acute Dehydration Resolved with IV fluids for rehydration  *Hyperkalemia Resolved  with IV fluids.  *Atrial fibrillation-paroxysmal Stable Continue Eliquis  Not on any AV nodal blocking agents  *Chronic kidney disease stage III Stable Avoid nephrotoxic agents   All the records are reviewed and case discussed with Care Management/Social Workerr. Management plans discussed with the patient, family and they are in agreement.  CODE STATUS:full  TOTAL TIME TAKING CARE OF THIS PATIENT: 40 minutes.     POSSIBLE D/C IN 1-2 DAYS, DEPENDING ON CLINICAL CONDITION.   Evelena Asa Kennard Fildes M.D on  07/07/2018   Between 7am to 6pm - Pager - [phone]  After 6pm go to [email or web address] - password [email or web address]  [facility]  Office  [phone]  CC: Primary care physician; Oswaldo Conroy, MD  Note: This dictation was prepared with Dragon dictation along with smaller phrase technology. Any transcriptional errors that result from this process are unintentional.

## 2018-07-07 NOTE — Care Management Obs Status (Signed)
MEDICARE OBSERVATION STATUS NOTIFICATION   Patient Details  Name: Destiny Mack MRN: [id number] Date of Birth: [birth year]   Medicare Observation Status Notification Given:  Yes    Thedford Bunton A Karenna Romanoff, RN 07/07/2018, 8:57 AM

## 2018-07-08 ENCOUNTER — Observation Stay: Payer: Medicare HMO

## 2018-07-08 DIAGNOSIS — R55 Syncope and collapse: Secondary | ICD-10-CM

## 2018-07-08 MED ORDER — ADULT MULTIVITAMIN W/MINERALS CH
1.0000 | ORAL_TABLET | Freq: Every day | ORAL | Status: DC
  Administered 2018-07-09: 10:00:00 1 via ORAL
  Filled 2018-07-08: qty 1

## 2018-07-08 MED ORDER — ENSURE ENLIVE PO LIQD
237.0000 mL | Freq: Two times a day (BID) | ORAL | Status: DC
  Administered 2018-07-09 (×2): 237 mL via ORAL

## 2018-07-08 NOTE — Progress Notes (Signed)
eeg completed ° °

## 2018-07-08 NOTE — Evaluation (Addendum)
Clinical/Bedside Swallow Evaluation Patient Details  Name: Destiny Mack MRN: [id number] Date of Birth: [birth year]  Today's Date: 07/08/2018 Time: SLP Start Time (ACUTE ONLY): 5366 SLP Stop Time (ACUTE ONLY): 1300 SLP Time Calculation (min) (ACUTE ONLY): 45 min  Past Medical History:  Past Medical History:  Diagnosis Date  . A-fib ([place])   . Depression   . Frequent falls   . Hypertension   . Leaking of urine   . Mixed incontinence   . Stroke ([place])   . Tremor of hands and face    Past Surgical History:  Past Surgical History:  Procedure Laterality Date  . APPENDECTOMY    . TEE WITHOUT CARDIOVERSION N/A 01/04/2018   Procedure: TRANSESOPHAGEAL ECHOCARDIOGRAM (TEE);  Surgeon: Corey Skains, MD;  Location: [facility] ORS;  Service: Cardiovascular;  Laterality: N/A;  . TUBAL LIGATION     HPI:  Pt is a 72 y/o female with history of chronic atrial fibrillation on anticoagulation, chronic kidney disease stage III depression, hypertension, recent subacute stroke and frequent falls. She was receiving PT/OT per Daughter present.  Patient presented to the ED after she was found unresponsive at her residence.  She was intubated in the emergency room for airway protection and CT head did not show any acute intracranial abnormalities.  Patient was recently discharged from the hospital after she was treated for subacute stroke.  She was found in a very poor hygienic state and living condition at her residence when brought to the ED.  She was found to have UTI and met criteria for sepsis.  Patient received vancomycin and Zosyn and cefepime while in the ER.  After admission to the CCU, NSG note reported color and characteristics of the gastric fluid coming from OG tube which could increase risk for aspiration to have occurred.  Pt has been NPO since oral intubation at admission.   Assessment / Plan / Recommendation Clinical Impression  Pt was recently assessed by this service during an admission earlier  this month. She returned home and tolerated a soft foods diet per Daughter's report w/ no overt coughing or throat clearing or choking noted. Pt is wearing her Dentures now.  At this eval, pt appears to present w/ grossly adequate oropharyngeal phase swallowing function w/ the trials given today w/ reduced risk for aspiration when following general aspiration precautions. Verbal cues required for follow through w/ tasks - unsure of pt's full Cognitive status/awareness. It was recommended pt f/u w/ Neurology for a more formal assessment to determine if any decline in Cognition for ADLs. Her extended illness(recent hospitalizations) and any Cognitive decline can impact the oropharyngeal swallowing and increase risk for aspiration, choking. Pt supported more fully upright in bed upon entering the room and given trials of ice chips, thin liquids Cup/Straw and purees/soft solids w/ no immediate, overt s/s of aspiration noted w/ po trials; no decline in vocal quality or respiratory status from her baseline noted during/post trials. Pt exhibited adequate oral management and fairly timely A-P transfer w/ all trial consistencies; she required min extra time for full mastication and oral clearing w/ the increased texture of soft solids but cleared appropriately given time. Verbal cues were given for follow through w/ tasks. Adequate oral clearing was [facility] post trials. Pt required full feeding assistance d/t tremorous, weak UEs. Pt was able to help hold the Cup when drinking. OM exam appeared grossly WFL; no anterior spillage or gross unilateral weakness appreciated. Recommend a more modified diet of Dysphagia level 3 (well-chopped meats)  w/ Thin liquids; aspiration precautions especially when drinking liquids; Pills Whole in Puree for safer, easier swallowing. ST services will f/u w/ toleration of diet next 1-2 days; education as needed. NSG/family updated, agreed.  SLP Visit Diagnosis: Dysphagia, oral phase (R13.11)(monitor  for risk for pharyngeal phase deficits)    Aspiration Risk  (reduced when following aspiration precautions)    Diet Recommendation  Dysphagia level 3 (well-chopped meats) w/ thin liquids - aspiration precautions especially when drinking liquids. Feeding support at meals - upright positioning.   Medication Administration: Whole meds with puree(for safer swallowing)    Other  Recommendations Recommended Consults: (Dietician f/u) Oral Care Recommendations: Oral care BID;Staff/trained caregiver to provide oral care Other Recommendations: (n/a currently)   Follow up Recommendations Home health SLP(TBD)      Frequency and Duration min 1 x/week  1 week       Prognosis Prognosis for Safe Diet Advancement: Fair(-Good) Barriers to Reach Goals: Cognitive deficits;Severity of deficits      Swallow Study   General Date of Onset: 07/06/18 HPI: Pt is a 72 y/o female with history of chronic atrial fibrillation on anticoagulation, chronic kidney disease stage III depression, hypertension, recent subacute stroke and frequent falls. She was receiving PT/OT per Daughter present.  Patient presented to the ED after she was found unresponsive at her residence.  She was intubated in the emergency room for airway protection and CT head did not show any acute intracranial abnormalities.  Patient was recently discharged from the hospital after she was treated for subacute stroke.  She was found in a very poor hygienic state and living condition at her residence when brought to the ED.  She was found to have UTI and met criteria for sepsis.  Patient received vancomycin and Zosyn and cefepime while in the ER.  After admission to the CCU, NSG note reported color and characteristics of the gastric fluid coming from OG tube which could increase risk for aspiration to have occurred.  Pt has been NPO since oral intubation at admission. Type of Study: Bedside Swallow Evaluation Previous Swallow Assessment: 10/30/17;  05/21/18; 07/01/18 Diet Prior to this Study: Dysphagia 1 (puree);Thin liquids(w/ some minced foods (d/t not wearing dentures)) Temperature Spikes Noted: No(11.1 declining from 16/1) Respiratory Status: Room air History of Recent Intubation: No Behavior/Cognition: Alert;Cooperative;Pleasant mood;Distractible;Requires cueing Oral Cavity Assessment: Within Functional Limits Oral Care Completed by SLP: Recent completion by staff Oral Cavity - Dentition: Dentures, top;Dentures, bottom Vision: Functional for self-feeding Self-Feeding Abilities: Able to feed self;Needs assist;Needs set up Patient Positioning: Upright in bed(needed better positioning upright in bed) Baseline Vocal Quality: Low vocal intensity(mumbled speech) Volitional Cough: (adequate) Volitional Swallow: Able to elicit    Oral/Motor/Sensory Function Overall Oral Motor/Sensory Function: Within functional limits(grossly)   Ice Chips Ice chips: Not tested Other Comments: pt drinking thin liquids w/ her lunch meal   Thin Liquid Thin Liquid: Within functional limits Presentation: Cup;Self Fed;Straw(fully assisted; 6 trials)    Nectar Thick Nectar Thick Liquid: Not tested   Honey Thick Honey Thick Liquid: Not tested   Puree Puree: Within functional limits Presentation: Spoon(fed; 3 trials)   Solid     Solid: Impaired Presentation: Spoon(fed; 5 trials of bread and butter) Oral Phase Impairments: Impaired mastication(min slow) Oral Phase Functional Implications: Impaired mastication(min slow) Pharyngeal Phase Impairments: (none) Other Comments: min slower oral phase completion but adequate when given time       Orinda Kenner, MS, CCC-SLP Abbigal Radich 07/08/2018,2:37 PM

## 2018-07-08 NOTE — Consult Note (Addendum)
Reason for Consult: Syncope concerns for seizure Referring Physician: Altamese Dilling, MD  CC: Syncope  HPI: Destiny Mack is an 72 y.o. female with history of chronic atrial fibrillation on anticoagulation, chronic kidney disease stage III, hypertension, recent embolic CVA, multiple falls, depression, and tremors presenting to the ED on 07/06/2018 with chief complaints of syncopal episode.  Per ED reports, patient was sitting upright in the wheelchair after being moved from the bed when she suddenly became unresponsive and passed out. Per patient's daughter who witnessed the episode report that patient staring blankly with eyes rolled backwards. Daughter reports that she biting down so hard that she had to separate her teeth. There was no reports of associated symptoms preceding the event of nausea and vomiting, feeling cold or clammy, visual auras or blurry vision, palpitations shortness of breath or chest pain. Patient lost control of her bladder and bowel following the event without tongue biting noted. Patient's daughter state that this is not her first episode, she has had similar episodes twice that were evaluated in the hospital. Initial work up in the ED revealed elevated white count 16., K+ 5.2, decreased kidney function at baseline, UA and culture positive for UTI and gram negative rods. Chest x-ray did not show active infectious process or abnormality. She was therefore admitted for further work up and management.  Past Medical History:  Diagnosis Date  . A-fib ([facility])   . Depression   . Frequent falls   . Hypertension   . Leaking of urine   . Mixed incontinence   . Stroke ([facility])   . Tremor of hands and face     Past Surgical History:  Procedure Laterality Date  . APPENDECTOMY    . TEE WITHOUT CARDIOVERSION N/A 01/04/2018   Procedure: TRANSESOPHAGEAL ECHOCARDIOGRAM (TEE);  Surgeon: Lamar Blinks, MD;  Location: [facility] ORS;  Service: Cardiovascular;  Laterality: N/A;  . TUBAL  LIGATION      Family History  Problem Relation Age of Onset  . Dementia Mother   . Heart failure Mother   . Heart failure Father   . CAD Father     Social History:  reports that she has been smoking. She has a 112.00 pack-year smoking history. She has never used smokeless tobacco. She reports that she does not drink alcohol or use drugs.  No Known Allergies  Medications:  I have reviewed the patient's current medications. Prior to Admission:  Medications Prior to Admission  Medication Sig Dispense Refill Last Dose  . amLODipine (NORVASC) 5 MG tablet Take 5 mg by mouth daily.   Past Week at Unknown time  . apixaban (ELIQUIS) 5 MG TABS tablet Take 1 tablet (5 mg total) by mouth 2 (two) times daily. 60 tablet 1 Past Week at Unknown time  . atorvastatin (LIPITOR) 20 MG tablet Take 20 mg by mouth at bedtime.    Past Week at Unknown time  . [EXPIRED] cefUROXime (CEFTIN) 500 MG tablet Take 1 tablet (500 mg total) by mouth 2 (two) times daily for 3 days. 6 tablet 0 Unknown at Unknown   Scheduled: . apixaban  5 mg Oral BID  . atorvastatin  20 mg Oral QHS  . guaiFENesin  600 mg Oral BID  . pneumococcal 23 valent vaccine  0.5 mL Intramuscular Tomorrow-1000    ROS: History obtained from the patient   General ROS: negative for - chills, fatigue, fever, night sweats, weight gain or weight loss Psychological ROS: negative for - behavioral disorder, hallucinations, memory difficulties,  mood swings or suicidal ideation Ophthalmic ROS: negative for - blurry vision, double vision, eye pain or loss of vision ENT ROS: negative for - epistaxis, nasal discharge, oral lesions, sore throat, tinnitus or vertigo Allergy and Immunology ROS: negative for - hives or itchy/watery eyes Hematological and Lymphatic ROS: negative for - bleeding problems, bruising or swollen lymph nodes Endocrine ROS: negative for - galactorrhea, hair pattern changes, polydipsia/polyuria or temperature intolerance Respiratory  ROS: negative for - cough, hemoptysis, shortness of breath or wheezing Cardiovascular ROS: negative for - chest pain, dyspnea on exertion, edema or irregular heartbeat Gastrointestinal ROS: negative for - abdominal pain, diarrhea, hematemesis, nausea/vomiting or stool incontinence Genito-Urinary ROS: negative for - dysuria, hematuria, incontinence or urinary frequency/urgency Musculoskeletal ROS: negative for - joint swelling or muscular weakness Neurological ROS: as noted in HPI Dermatological ROS: negative for rash and skin lesion changes  Physical Examination: Blood pressure (!) 152/63, pulse 63, temperature 98.4 F (36.9 C), temperature source Oral, resp. rate 20, height 5\' 3"  (1.6 m), weight 34.9 kg, SpO2 99 %.  HEENT-  Normocephalic, no lesions, without obvious abnormality.  Normal external eye and conjunctiva.  Normal TM's bilaterally.  Normal auditory canals and external ears. Normal external nose, mucus membranes and septum.  Normal pharynx. Cardiovascular- S1, S2 normal, pulses palpable throughout   Lungs- chest clear, no wheezing, rales, normal symmetric air entry Abdomen- soft, non-tender; bowel sounds normal; no masses,  no organomegaly Extremities- no edema Lymph-no adenopathy palpable Musculoskeletal-no joint tenderness, deformity or swelling Skin-warm and dry, no hyperpigmentation, vitiligo, or suspicious lesions  Neurological Exam  Mental Status: Alert and awake.  Speech fluent without evidence of aphasia.  Able to follow 3 step commands without difficulty. Cranial Nerves: II: Discs flat bilaterally; Visual fields grossly normal, pupils equal, round, reactive to light and accommodation III,IV, VI: ptosis not present, extra-ocular motions intact bilaterally V,VII: smile symmetric, facial light touch sensation normal bilaterally VIII: hearing normal bilaterally IX,X: gag reflex present XI: bilateral shoulder shrug XII: midline tongue extension Motor: Right : Upper  extremity   4/5                                      Left:     Upper extremity   5/5             Lower extremity   4/5                                                 Lower extremity   5/5 Tone and bulk:normal tone throughout; no atrophy noted Sensory: Pinprick and light touch intact throughout, bilaterally Deep Tendon Reflexes: 2+in the upper extremities and absent in the lower extremities Plantars: Right:upgoingLeft: upgoing Cerebellar: Finger to nose testing intact bilaterally. Unable to perform heel to shin with multiple attempts Gait: not tested due to safety concerns.  Data Reviewed  Laboratory Studies:   Basic Metabolic Panel: Recent Labs  Lab 07/02/18 0649 07/06/18 1200 07/07/18 0515  NA 143 140 141  K 4.0 5.2* 4.1  CL 111 106 110  CO2 27 23 26   GLUCOSE 101* 106* 94  BUN 21 33* 29*  CREATININE 1.20* 1.37* 1.23*  CALCIUM 8.9 8.9 8.4*  MG 2.0  --   --     Liver Function  Tests: No results for input(s): AST, ALT, ALKPHOS, BILITOT, PROT, ALBUMIN in the last 168 hours. No results for input(s): LIPASE, AMYLASE in the last 168 hours. No results for input(s): AMMONIA in the last 168 hours.  CBC: Recent Labs  Lab 07/06/18 1200 07/07/18 0515  WBC 16.1* 11.1*  NEUTROABS 13.1*  --   HGB 12.9 10.1*  HCT 42.4 32.6*  MCV 98.8 97.0  PLT 281 231    Cardiac Enzymes: Recent Labs  Lab 07/06/18 1200  TROPONINI <0.03    BNP: Invalid input(s): POCBNP  CBG: Recent Labs  Lab 07/01/18 2110  GLUCAP 144*    Microbiology: Results for orders placed or performed during the hospital encounter of 07/06/18  Urine culture     Status: Abnormal (Preliminary result)   Collection Time: 07/06/18 11:50 AM  Result Value Ref Range Status   Specimen Description   Final    URINE, RANDOM Performed at [facility], [address], [place], [place] [address]    Special Requests   Final    NONE Performed at [facility], [address], [place], [place] [address]    Culture >=100,000 COLONIES/mL GRAM NEGATIVE RODS (A)  Final   Report Status PENDING  Incomplete  Culture, blood (single) w Reflex to ID Panel     Status: None (Preliminary result)   Collection Time: 07/06/18 11:50 AM  Result Value Ref Range Status   Specimen Description BLOOD L HAND  Final   Special Requests   Final    BOTTLES DRAWN AEROBIC ONLY Blood Culture results may not be optimal due to an inadequate volume of blood received in culture bottles   Culture   Final    NO GROWTH 2 DAYS Performed at [facility], [address], [place], [place] [address]    Report Status PENDING  Incomplete  Culture, blood (routine x 2)     Status: None (Preliminary result)   Collection Time: 07/06/18  5:53 PM  Result Value Ref Range Status   Specimen Description BLOOD L HAND  Final   Special Requests   Final    BOTTLES DRAWN AEROBIC AND ANAEROBIC Blood Culture results may not be optimal due to an inadequate volume of blood received in culture bottles   Culture   Final    NO GROWTH 2 DAYS Performed at [facility], [address] [place], [place] [address]    Report Status PENDING  Incomplete    Coagulation Studies: No results for input(s): LABPROT, INR in the last 72 hours.  Urinalysis:  Recent Labs  Lab 07/06/18 1316  COLORURINE YELLOW*  LABSPEC 1.015  PHURINE 6.0  GLUCOSEU NEGATIVE  HGBUR SMALL*  BILIRUBINUR NEGATIVE  KETONESUR NEGATIVE  PROTEINUR 30*  NITRITE POSITIVE*  LEUKOCYTESUR LARGE*    Lipid Panel:     Component Value Date/Time   CHOL 162 05/21/2018 0321   TRIG 92 06/27/2018 0433   HDL 33 (L) 05/21/2018 0321   CHOLHDL 4.9 05/21/2018 0321   VLDL 38 05/21/2018 0321   LDLCALC 91 05/21/2018 0321    HgbA1C:  Lab Results  Component Value Date   HGBA1C 5.5 05/21/2018    Urine Drug Screen:  No results found for: LABOPIA, COCAINSCRNUR, LABBENZ, AMPHETMU, THCU, LABBARB  Alcohol Level: No results for  input(s): ETH in the last 168 hours.   Imaging: US Renal  Result Date: 07/06/2018 CLINICAL DATA:  Recurrent urinary tract infection. EXAM: RENAL / URINARY TRACT ULTRASOUND COMPLETE COMPARISON:  None. FINDINGS: Right Kidney:  Renal measurements: 6.6 x 2.4 x 2.9 cm = volume: 24 mL. Kidney is small with cortical thinning. No mass or hydronephrosis. Left Kidney: Renal measurements: 9.4 x 5.4 x 4.3 cm = volume: 116 mL. Echogenicity within normal limits. No mass or hydronephrosis visualized. Bladder: Appears normal for degree of bladder distention. IMPRESSION: Small RIGHT kidney with cortical thinning. No mass or hydronephrosis. Normal LEFT kidney. Unremarkable bladder. Electronically Signed   By: Elsie Stain M.D.   On: 07/06/2018 17:28   Dg Chest Port 1 View  Result Date: 07/06/2018 CLINICAL DATA:  Syncope, possible seizure EXAM: PORTABLE CHEST 1 VIEW COMPARISON:  07/02/2018 FINDINGS: The patient is rotated to the left on today's radiograph, reducing diagnostic sensitivity and specificity. The lungs appear clear. Old right rib and left clavicular fractures with resulting deformity. Bony demineralization. Heart size is within normal limits compensating for the degree of leftward rotation. IMPRESSION: 1.  No active cardiopulmonary disease is radiographically apparent. 2. Old right rib and left clavicular deformities. Electronically Signed   By: Gaylyn Rong M.D.   On: 07/06/2018 13:09   Patient seen and examined.  Clinical course and management discussed.  Necessary edits performed.  I agree with the above.  Assessment and plan of care developed and discussed below.    Assessment: 72 y.o. female with history of chronic atrial fibrillation on anticoagulation, chronic kidney disease stage III, hypertension, recent embolic CVA, multiple falls, depression, and tremors presenting with chief complaints of syncopal episode and witnessed seizure like activity associated with loss of bladder and bowel control.  Given history of prior embolic strokes in the setting of syncope would rule out an acute ischemic event.  Unclear if seizure-like event actually epileptic.  In the past this has not seemed the case and appeared to be related either to ischemia or hypoperfusion.  Patient was seated with this event as well and it seemed to resolve on her being lowered to the ground.  Further work up recommended.    Plan: 1. EEG pending 2. MRI brain without contrast 3. Seizure precautions 4. Ativan prn seizure activity 5. Anticonvulsant therapy not indicated at this time.   6. Telemetry monitoring 7. Frequent neuro checks 8. Orthostatic vitals   This patient was staffed with Dr. Verlon Au, Thad Ranger who personally evaluated patient, reviewed documentation and agreed with assessment and plan of care as above.  Webb Silversmith, DNP, FNP-BC Board certified Nurse Practitioner Neurology Department  07/08/2018, 9:55 AM   Thana Farr, MD Neurology [phone]  07/08/2018  11:22 AM

## 2018-07-08 NOTE — Progress Notes (Signed)
Initial Nutrition Assessment  DOCUMENTATION CODES:   Non-severe (moderate) malnutrition in context of chronic illness  INTERVENTION:  Provide Ensure Enlive po BID, each supplement provides 350 kcal and 20 grams of protein. Patient prefers vanilla.  Provide daily MVI.  NUTRITION DIAGNOSIS:   Moderate Malnutrition related to chronic illness(hx CVA, inadequate oral intake) as evidenced by moderate fat depletion, moderate muscle depletion.  GOAL:   Patient will meet greater than or equal to 90% of their needs  MONITOR:   PO intake, Supplement acceptance, Labs, Weight trends, I & O's, Skin  REASON FOR ASSESSMENT:   Malnutrition Screening Tool, Consult Assessment of nutrition requirement/status  ASSESSMENT:   72 year old female with PMHx of tremor of hands and face, frequent falls, depression, hx CVA, CKD stage III, HTN, Afib who is admitted with acute recurrent UTI, episodes of unresponsiveness with jerking movements.   Met with patient at bedside. She is known to RD from previous admissions. She had a decreased appetite for approximately 6 months earlier this year. She reports her appetite is fairly good now. She is eating 2 meals per day. She reports trying to choose a protein at each meal but is not able to provide any other specifics on intake. She ate 100% of her lunch today. She drinks Ensure BID and prefers the vanilla flavor.  UBW 180 lbs. Patient lost weight unintentionally over unknown time frame but reports her weight has stabilized now. RD suspects weight of 34.9 kg from 11/9 is not accurate. RD obtained bed scale weight of 65.3 kg (143.96 lbs) today.  Medications reviewed and include: Eliquis, ceftriaxone.  Labs reviewed: BUN 29, Creatinine 1.23.  NUTRITION - FOCUSED PHYSICAL EXAM:    Most Recent Value  Orbital Region  Mild depletion  Upper Arm Region  Moderate depletion  Thoracic and Lumbar Region  Moderate depletion  Buccal Region  Mild depletion  Temple  Region  Moderate depletion  Clavicle Bone Region  Moderate depletion  Clavicle and Acromion Bone Region  Moderate depletion  Scapular Bone Region  Unable to assess  Dorsal Hand  Severe depletion  Patellar Region  Moderate depletion  Anterior Thigh Region  Moderate depletion  Posterior Calf Region  Moderate depletion  Edema (RD Assessment)  Mild  Hair  Reviewed  Eyes  Unable to assess  Mouth  Unable to assess  Skin  Reviewed  Nails  Reviewed     Diet Order:   Diet Order            Diet Heart Room service appropriate? Yes with Assist; Fluid consistency: Thin  Diet effective now             EDUCATION NEEDS:   No education needs have been identified at this time  Skin:  Skin Assessment: Skin Integrity Issues:(stage II to sacrum)  Last BM:  07/07/2018  Height:   Ht Readings from Last 1 Encounters:  07/06/18 '5\' 3"'  (1.6 m)   Weight:   Wt Readings from Last 1 Encounters:  07/08/18 65.3 kg   Ideal Body Weight:  52.3 kg  BMI:  Body mass index is 25.5 kg/m.  Estimated Nutritional Needs:   Kcal:  1365-1590 (MSJ x 1.2-1.4)  Protein:  80-95 grams (1.3-1.5 grams/kg)  Fluid:  1.6 L/day (25 mL/kg)  Willey Blade, MS, RD, LDN Office: [phone] Pager: [phone] After Hours/Weekend Pager: [phone]

## 2018-07-08 NOTE — Evaluation (Signed)
Physical Therapy Evaluation Patient Details Name: Destiny Mack MRN: [id number] DOB: 1946/07/09 Today's Date: 07/08/2018   History of Present Illness  Patient is a 72 year old female admitted following syncope and collapse in her home.  PMH includes stroke (06/28/2018), atrial fibrillation, sepsis/UTI (06/24/2018).  Clinical Impression  Pt is a 72 year old female who lives in an apartment with her daughter.  She has been hospitalized in this month for a stroke and sepsis and UTI.  Pt in bed with NA taking vitals upon PT arrival.  Pt awake and intermittently responsive to PT.  When asked if pt had started PT following her stroke, she replied "no".  PT and NA assisted pt to EOB, encouraging her to participate as much as possible.  Pt able to demonstrate gross movement with LE's but does not initiate resistance during MMT.  Pt able to correct posture from postero-lateral lean to L on command.  Required assistance from NA and PT to place hands and feet for support.  Pt reported no sensation loss in hands or feet.  She also reported that her joints were painful during active movement. PT educated pt about the importance of frequent movement and PT following stroke for prevention of pressure ulcers and management of joint pain.  Pt agreed that she would try tomorrow.  Pt will continue to benefit from skilled PT with focus on strength, tolerance to activity, balance, transfers and pain management.    Follow Up Recommendations SNF    Equipment Recommendations       Recommendations for Other Services       Precautions / Restrictions Precautions Precautions: Fall Precaution Comments: High Fall Risk Restrictions Weight Bearing Restrictions: No      Mobility  Bed Mobility Overal bed mobility: Needs Assistance Bed Mobility: Supine to Sit;Sit to Supine     Supine to sit: Total assist Sit to supine: Total assist   General bed mobility comments: Pt does not actively assist with bed mobility but  able to move LE's to clear foot of bed.  Unable to place feet to support pt sitting at EOB.  She was able to trunk lean to R and flex forward rounding back at command.  Transfers                    Ambulation/Gait                Stairs            Wheelchair Mobility    Modified Rankin (Stroke Patients Only)       Balance Overall balance assessment: Needs assistance   Sitting balance-Leahy Scale: Poor Sitting balance - Comments: PT and NA assisted with UE and foot placement to support pt sitting upright. Postural control: Posterior lean;Left lateral lean                                   Pertinent Vitals/Pain Pain Assessment: Faces Faces Pain Scale: Hurts little more Pain Location: B LE's with mobility.    Home Living Family/patient expects to be discharged to:: Skilled nursing facility Living Arrangements: Children Available Help at Discharge: Family;Available 24 hours/day Type of Home: Apartment Home Access: Level entry     Home Layout: Two level;Able to live on main level with bedroom/bathroom Home Equipment: Bedside commode;Wheelchair - manual;Hospital bed Additional Comments: No family present to confirm information provided by pt, pt is a questionable historian  Prior Function Level of Independence: Needs assistance   Gait / Transfers Assistance Needed: Non-ambulatory at baseline, performing transfers to/from manual WC total assist from son.   ADL's / Homemaking Assistance Needed: Previous PT note reports that pt recieves assist from daughter as needed for ADLs, grooming (wears depends, uses bedpan);  today pt states that she has been transferring to [facility].        Hand Dominance   Dominant Hand: Right    Extremity/Trunk Assessment   Upper Extremity Assessment Upper Extremity Assessment: Generalized weakness(Pt does not intiate movement with L UE on commands but presented with good grip strength of R UE.)    Lower  Extremity Assessment Lower Extremity Assessment: Generalized weakness(Able to perform gross movement but does not resist PT for MMT.)    Cervical / Trunk Assessment Cervical / Trunk Assessment: Kyphotic  Communication   Communication: No difficulties(Pt does not have expressive difficulties but does not answer PT consistently.)  Cognition Arousal/Alertness: Awake/alert Behavior During Therapy: Restless Overall Cognitive Status: Within Functional Limits for tasks assessed                                 General Comments: Follows commands inconsistently.      General Comments      Exercises Other Exercises Other Exercises: Educated pt concerning bed mobility, importance of participation with therapy and pressure ulcers.  x10 min   Assessment/Plan    PT Assessment Patient needs continued PT services  PT Problem List Decreased activity tolerance;Decreased strength;Decreased mobility;Decreased cognition;Decreased safety awareness;Cardiopulmonary status limiting activity;Impaired tone;Decreased range of motion;Decreased coordination;Decreased balance;Decreased knowledge of use of DME;Decreased knowledge of precautions       PT Treatment Interventions DME instruction;Functional mobility training;Therapeutic activities;Balance training;Cognitive remediation;Therapeutic exercise;Neuromuscular re-education;Patient/family education    PT Goals (Current goals can be found in the Care Plan section)  Acute Rehab PT Goals Patient Stated Goal: To manage LE pain PT Goal Formulation: With patient Time For Goal Achievement: 07/29/18 Potential to Achieve Goals: Fair    Frequency Min 2X/week   Barriers to discharge        Co-evaluation               AM-PAC PT "6 Clicks" Daily Activity  Outcome Measure Difficulty turning over in bed (including adjusting bedclothes, sheets and blankets)?: Unable Difficulty moving from lying on back to sitting on the side of the bed? :  Unable Difficulty sitting down on and standing up from a chair with arms (e.g., wheelchair, bedside commode, etc,.)?: Unable Help needed moving to and from a bed to chair (including a wheelchair)?: Total Help needed walking in hospital room?: Total Help needed climbing 3-5 steps with a railing? : Total 6 Click Score: 6    End of Session Equipment Utilized During Treatment: Gait belt Activity Tolerance: Patient limited by fatigue Patient left: in bed;with call bell/phone within reach;with bed alarm set;with nursing/sitter in room Nurse Communication: Mobility status PT Visit Diagnosis: Muscle weakness (generalized) (M62.81);Apraxia (R48.2);History of falling (Z91.81)    Time: 1610-9604     Charges:   PT Evaluation $PT Eval Moderate Complexity: 1 Mod PT Treatments $Therapeutic Activity: 8-22 mins        Glenetta Hew, PT, DPT  Glenetta Hew 07/08/2018, 3:58 PM

## 2018-07-08 NOTE — Procedures (Signed)
ELECTROENCEPHALOGRAM REPORT   Patient: Destiny Mack       Room #: 101A-AA EEG No. ID: [id number] Age: 72 y.o.        Sex: female Referring Physician: Mayo Report Date:  07/08/2018        Interpreting Physician: Thana Farr  History: [name] is an 72 y.o. female with seizure-like episode  Medications:  Eliquis, Lipitor, Rocephin, Mucinex  Conditions of Recording:  This is a 21 channel routine scalp EEG performed with bipolar and monopolar montages arranged in accordance to the international 10/20 system of electrode placement. One channel was dedicated to EKG recording.  The patient is in the awake and drowsy states.  Description:  The waking background activity consists of a low voltage, symmetrical, fairly well organized, 9 Hz alpha activity, seen from the parieto-occipital and posterior temporal regions.  Low voltage fast activity, poorly organized, is seen anteriorly and is at times superimposed on more posterior regions.  A mixture of theta and alpha rhythms are seen from the central and temporal regions. The patient drowses with slowing to irregular, low voltage theta and beta activity.   Stage II sleep is not obtained. No epileptiform activity is noted.   Hyperventilation was not performed.  Intermittent photic stimulation was performed but failed to illicit any change in the tracing.    IMPRESSION: Normal electroencephalogram, awake, drowsy and with activation procedures. There are no focal lateralizing or epileptiform features.   Thana Farr, MD Neurology [phone] 07/08/2018, 2:17 PM

## 2018-07-08 NOTE — Plan of Care (Signed)

## 2018-07-08 NOTE — Progress Notes (Signed)
[employer] [facility] [facility] [place] at [facility]   PATIENT NAME: Destiny Mack    MR#:  [id number]  DATE OF BIRTH:  [birth year]  SUBJECTIVE:  Patient awake and talking this morning.  States that she feels fine.  No headaches or changes in vision.  REVIEW OF SYSTEMS:  CONSTITUTIONAL: No fever, fatigue or weakness.  EYES: No blurred or double vision.  EARS, NOSE, AND THROAT: No tinnitus or ear pain.  RESPIRATORY: No cough, shortness of breath, wheezing or hemoptysis.  CARDIOVASCULAR: No chest pain, orthopnea, edema.  GASTROINTESTINAL: No nausea, vomiting, diarrhea or abdominal pain.  GENITOURINARY: No dysuria, hematuria.  ENDOCRINE: No polyuria, nocturia,  HEMATOLOGY: No anemia, easy bruising or bleeding SKIN: No rash or lesion. MUSCULOSKELETAL: No joint pain or arthritis.   NEUROLOGIC: No tingling, numbness, weakness.  PSYCHIATRY: No anxiety or depression.   DRUG ALLERGIES:  No Known Allergies  VITALS:  Blood pressure (!) 152/63, pulse 63, temperature 98.4 F (36.9 C), temperature source Oral, resp. rate 20, height 5\' 3"  (1.6 m), weight 34.9 kg, SpO2 99 %.  PHYSICAL EXAMINATION:  GENERAL:  72 y.o.-year-old patient lying in the bed with no acute distress.  EYES: Pupils equal, round, reactive to light and accommodation. No scleral icterus. Extraocular muscles intact.  HEENT: Head atraumatic, normocephalic. Oropharynx and nasopharynx clear.  NECK:  Supple, no jugular venous distention. No thyroid enlargement, no tenderness.  LUNGS: Normal breath sounds bilaterally, no wheezing, rales,rhonchi or crepitation. No use of accessory muscles of respiration.  CARDIOVASCULAR: RRR, S1, S2 normal. No murmurs, rubs, or gallops.  ABDOMEN: Soft, nontender, nondistended. Bowel sounds present. No organomegaly or mass.  EXTREMITIES: No pedal edema, cyanosis, or clubbing.  NEUROLOGIC: Cranial nerves II through XII are intact. Muscle strength 5/5 in all extremities. Sensation intact. Gait not  checked.  PSYCHIATRIC: The patient is alert and oriented x 3.  SKIN: No obvious rash, lesion, or ulcer.   LABORATORY PANEL:   CBC Recent Labs  Lab 07/07/18 0515  WBC 11.1*  HGB 10.1*  HCT 32.6*  PLT 231   ------------------------------------------------------------------------------------------------------------------  Chemistries  Recent Labs  Lab 07/02/18 0649  07/07/18 0515  NA 143   < > 141  K 4.0   < > 4.1  CL 111   < > 110  CO2 27   < > 26  GLUCOSE 101*   < > 94  BUN 21   < > 29*  CREATININE 1.20*   < > 1.23*  CALCIUM 8.9   < > 8.4*  MG 2.0  --   --    < > = values in this interval not displayed.   ------------------------------------------------------------------------------------------------------------------  Cardiac Enzymes Recent Labs  Lab 07/06/18 1200  TROPONINI <0.03   ------------------------------------------------------------------------------------------------------------------  RADIOLOGY:  US Renal  Result Date: 07/06/2018 CLINICAL DATA:  Recurrent urinary tract infection. EXAM: RENAL / URINARY TRACT ULTRASOUND COMPLETE COMPARISON:  None. FINDINGS: Right Kidney: Renal measurements: 6.6 x 2.4 x 2.9 cm = volume: 24 mL. Kidney is small with cortical thinning. No mass or hydronephrosis. Left Kidney: Renal measurements: 9.4 x 5.4 x 4.3 cm = volume: 116 mL. Echogenicity within normal limits. No mass or hydronephrosis visualized. Bladder: Appears normal for degree of bladder distention. IMPRESSION: Small RIGHT kidney with cortical thinning. No mass or hydronephrosis. Normal LEFT kidney. Unremarkable bladder. Electronically Signed   By: Elsie Stain M.D.   On: 07/06/2018 17:28   Dg Chest Port 1 View  Result Date: 07/06/2018 CLINICAL DATA:  Syncope, possible seizure EXAM: PORTABLE CHEST  1 VIEW COMPARISON:  07/02/2018 FINDINGS: The patient is rotated to the left on today's radiograph, reducing diagnostic sensitivity and specificity. The lungs appear clear.  Old right rib and left clavicular fractures with resulting deformity. Bony demineralization. Heart size is within normal limits compensating for the degree of leftward rotation. IMPRESSION: 1.  No active cardiopulmonary disease is radiographically apparent. 2. Old right rib and left clavicular deformities. Electronically Signed   By: Gaylyn Rong M.D.   On: 07/06/2018 13:09    ASSESSMENT AND PLAN:   Acute recurrent UTI- urine culture from 06/27/18 noted for E. Coli.  Renal ultrasound negative. WBC trending down. -Continue ceftriaxone -Follow-up urine culture -Needs urology follow-up on discharge  Episodes of unresponsiveness with jerking movements- no issues this morning, patient alert and talking -Neurology consulted -EEG and MRI brain today -Aspiration/fall/seizure precautions  Hx of CVA- stable -Continue eliquis and statin -PT/SLP consult  Paroxysmal atrial fibrillation- in NSR -Continue Eliquis   Chronic kidney disease stage III- stable, creatinine at baseline -Avoid nephrotoxic agents  Anemia of chronic kidney disease- Hgb at baseline -Monitor  All the records are reviewed and case discussed with Care Management/Social Workerr. Management plans discussed with the patient, family and they are in agreement.  CODE STATUS:full  TOTAL TIME TAKING CARE OF THIS PATIENT: 35 minutes.    POSSIBLE D/C IN 1-2 DAYS, DEPENDING ON CLINICAL CONDITION.   Jinny Blossom Mayo M.D on 07/08/2018   Between 7am to 6pm - Pager - [phone]  After 6pm go to [email or web address] - password [email or web address]  [facility]  Office  [phone]  CC: Primary care physician; Oswaldo Conroy, MD  Note: This dictation was prepared with Dragon dictation along with smaller phrase technology. Any transcriptional errors that result from this process are unintentional.

## 2018-07-09 ENCOUNTER — Observation Stay: Payer: Medicare HMO

## 2018-07-09 LAB — CBC
HCT: 31.8 % — ABNORMAL LOW (ref 36.0–46.0)
Hemoglobin: 10.1 g/dL — ABNORMAL LOW (ref 12.0–15.0)
MCH: 30.3 pg (ref 26.0–34.0)
MCHC: 31.8 g/dL (ref 30.0–36.0)
MCV: 95.5 fL (ref 80.0–100.0)
Platelets: 211 10*3/uL (ref 150–400)
RBC: 3.33 MIL/uL — AB (ref 3.87–5.11)
RDW: 14.6 % (ref 11.5–15.5)
WBC: 9.9 10*3/uL (ref 4.0–10.5)
nRBC: 0 % (ref 0.0–0.2)

## 2018-07-09 LAB — URINE CULTURE

## 2018-07-09 LAB — BASIC METABOLIC PANEL
Anion gap: 9 (ref 5–15)
BUN: 18 mg/dL (ref 8–23)
CHLORIDE: 108 mmol/L (ref 98–111)
CO2: 23 mmol/L (ref 22–32)
CREATININE: 1.08 mg/dL — AB (ref 0.44–1.00)
Calcium: 8.8 mg/dL — ABNORMAL LOW (ref 8.9–10.3)
GFR calc Af Amer: 58 mL/min — ABNORMAL LOW (ref >60)
GFR calc non Af Amer: 50 mL/min — ABNORMAL LOW (ref >60)
GLUCOSE: 97 mg/dL (ref 70–99)
Potassium: 4 mmol/L (ref 3.5–5.1)
Sodium: 140 mmol/L (ref 135–145)

## 2018-07-09 MED ORDER — CEPHALEXIN 500 MG PO CAPS: 500.0000 mg | ORAL_CAPSULE | Freq: Two times a day (BID) | ORAL | 0 refills | Status: DC

## 2018-07-09 MED ORDER — CEPHALEXIN 500 MG PO CAPS
500.0000 mg | ORAL_CAPSULE | Freq: Two times a day (BID) | ORAL | Status: DC
  Administered 2018-07-09: 10:00:00 500 mg via ORAL
  Filled 2018-07-09: qty 1

## 2018-07-09 NOTE — Progress Notes (Signed)
Pt. Refused to stand for orthostatic vital signs per CNA - Felicia. Torrie MayersMegan Remington Highbaugh RN

## 2018-07-09 NOTE — Discharge Instructions (Signed)
It was so nice to meet you during this hospitalization!  You have a UTI. I have sent in a medication to your pharmacy called Keflex. Please take this twice a day starting tonight until the prescription runs out.  -Dr. Nancy MarusMayo

## 2018-07-09 NOTE — Progress Notes (Signed)
EMS contacted for transport.  Danielle Brooklen Runquist, RN, BSN 

## 2018-07-09 NOTE — Progress Notes (Signed)
Speech Language Pathology Treatment: Dysphagia  Patient Details Name: Destiny Mack MRN: [id number] DOB: 03-04-1946 Today's Date: 07/09/2018 Time: 1240-1330 SLP Time Calculation (min) (ACUTE ONLY): 50 min  Assessment / Plan / Recommendation Clinical Impression  Pt seen for ongoing toleration of diet - recommended a more Mech Soft diet consistency d/t pt wearing Dentures and her overall declined Cognitive status. Pt had been recommended to be on a modified food consistency diet last admission(recent). NSG staff feeding pt during this lunch meal. NSG reported no overt s/s of aspiration during breakfast or w/ Pill swallowing in puree earlier this morning.  Pt appeared to tolerate trials of thin liquids via Cup and puree/mech soft foods w/ no overt s/s of aspiration noted; no decline in respiratory status noted during/post trials. Noted oral phase deficits c/b lengthier oral phase time w/ lengthy bolus mastication moreso w/ increased textured foods and meats. Encouraged pt and NSG staff to mash the foods down more and alternate b/t foods and liquids to aid oral clearing. Suspect this will be needed at home as well as ensuring that pt is wearing her Dentures for most efficient mastication during oral intake. Pt was being fed and seemed to need full assistance w/ such during this meal.  Recommend continue a more Mech Soft diet consistency w/ minced, well-cut meats; added Puree foods in the diet. Recommend thin liquids via Cup w/ monitoring of any use of straws. Recommend Pills given Whole in a Puree for safer swallowing. Recommend aspiration precautions and support at all meals.  No further skilled ST Services indicated at this time as pt appears close to/at her baseline from last admission and per Daughter's report. NSG updated and will reconsult if needed while admitted.      HPI HPI: Pt is a 72 y/o female with history of chronic atrial fibrillation on anticoagulation, chronic kidney disease stage III  depression, hypertension, recent subacute stroke and frequent falls. She was receiving PT/OT per Daughter present.  Patient presented to the ED after she was found unresponsive at her residence.  She was intubated in the emergency room for airway protection and CT head did not show any acute intracranial abnormalities.  Patient was recently discharged from the hospital after she was treated for subacute stroke.  She was found in a very poor hygienic state and living condition at her residence when brought to the ED.  She was found to have UTI and met criteria for sepsis.  Patient received vancomycin and Zosyn and cefepime while in the ER.  After admission to the CCU, NSG note reported color and characteristics of the gastric fluid coming from OG tube which could increase risk for aspiration to have occurred.  Pt has been NPO since oral intubation at admission.      SLP Plan  All goals met(pt appears at her baseline per Daughter's report; last admit)       Recommendations  Diet recommendations: Dysphagia 3 (mechanical soft);Thin liquid(w/ added purees/minced meats) Liquids provided via: Cup;No straw Medication Administration: Whole meds with puree(for safer, easier swallowing) Supervision: Staff to assist with self feeding;Intermittent supervision to cue for compensatory strategies Compensations: Minimize environmental distractions;Slow rate;Small sips/bites;Lingual sweep for clearance of pocketing;Multiple dry swallows after each bite/sip;Follow solids with liquid Postural Changes and/or Swallow Maneuvers: Seated upright 90 degrees;Upright 30-60 min after meal                General recommendations: (Dietician recommendations) Oral Care Recommendations: Oral care BID;Staff/trained caregiver to provide oral care Follow up  Recommendations: Home health SLP(TBD) SLP Visit Diagnosis: Dysphagia, oral phase (R13.11)(increases risk for pharyngeal phase deficit; Dentures needed) Plan: All goals  met(pt appears at her baseline per Daughter's report; last admit)       [place], [place], CCC-SLP Monico Sudduth 07/09/2018, 3:34 PM

## 2018-07-09 NOTE — Discharge Summary (Signed)
[employer] [facility] [place] [facility]   PATIENT NAME: Destiny Mack    MR#:  [id number]  DATE OF BIRTH:  12-Mar-1946  DATE OF ADMISSION:  07/06/2018   ADMITTING PHYSICIAN: Altamese Dilling, MD  DATE OF DISCHARGE: 07/09/18  PRIMARY CARE PHYSICIAN: Oswaldo Conroy, MD   ADMISSION DIAGNOSIS:  Recurrent UTI [N39.0] AKI (acute kidney injury) (HCC) [N17.9] Urinary tract infection with hematuria, site unspecified [N39.0, R31.9] Syncope, unspecified syncope type [R55] DISCHARGE DIAGNOSIS:  Active Problems:   Syncope   Acute lower UTI   UTI (urinary tract infection)  SECONDARY DIAGNOSIS:   Past Medical History:  Diagnosis Date  . A-fib (HCC)   . Depression   . Frequent falls   . Hypertension   . Leaking of urine   . Mixed incontinence   . Stroke (HCC)   . Tremor of hands and face    HOSPITAL COURSE:   Destiny Mack is a 72 year old female who presented to the ED with a possible syncopal episode and with generalized weakness.  She was just recently hospitalized for sepsis secondary to UTI and was discharged on oral antibiotics.  In the ED, she was noted to have a UTI again with leukocytosis.  She also appeared to be dehydrated.  She was admitted for further management.  Acute recurrent UTI- improve -Urine culture from 06/27/18 noted for E. Coli.   -Repeat urine culture this admission also with E. Coli -Initially treated with ceftriaxone and then transitioned to Keflex for a total 7-day course -Renal ultrasound performed due to recurrent UTI and was negative.  -Needs urology follow-up on discharge  Episodes of unresponsiveness with jerking movements- resolved.  Unclear etiology. -Seen by neurology this admission -EEG and MRI brain unremarkable -PT recommended SNF, but daughter preferred that patient be discharged home with home health.  -Seen by SLP, who recommended home health speech therapy and dysphagia 3 diet  Hx of CVA- stable -Continued eliquis  and statin  Paroxysmal atrial fibrillation- in NSR this admission -Continued Eliquis   DISCHARGE CONDITIONS:  Recurrent E. coli UTI Dysphasia History CVA Paroxysmal atrial fibrillation Chronic kidney disease stage III Anemia of chronic kidney disease CONSULTS OBTAINED:  Treatment Team:  Pauletta Browns, MD DRUG ALLERGIES:  No Known Allergies DISCHARGE MEDICATIONS:   Allergies as of 07/09/2018   No Known Allergies     Medication List    STOP taking these medications   cefUROXime 500 MG tablet Commonly known as:  CEFTIN     TAKE these medications   amLODipine 5 MG tablet Commonly known as:  NORVASC Take 5 mg by mouth daily.   apixaban 5 MG Tabs tablet Commonly known as:  ELIQUIS Take 1 tablet (5 mg total) by mouth 2 (two) times daily.   atorvastatin 20 MG tablet Commonly known as:  LIPITOR Take 20 mg by mouth at bedtime.   cephALEXin 500 MG capsule Commonly known as:  KEFLEX Take 1 capsule (500 mg total) by mouth 2 (two) times daily.            Durable Medical Equipment  (From admission, onward)         Start     Ordered   07/09/18 0935  For home use only DME Other see comment  Once    Comments:  Lift   07/09/18 0934           DISCHARGE INSTRUCTIONS:  1.  Follow-up with PCP in 1 to 2 weeks 2.  Continue Keflex for a total 7-day  course 3.  Needs outpatient neurology follow-up due to recurrent UTI 4.  Home health services ordered on discharge DIET:  Dysphasia 3 diet DISCHARGE CONDITION:  Stable ACTIVITY:  Activity as tolerated OXYGEN:  Home Oxygen: No.  Oxygen Delivery: room air DISCHARGE LOCATION:  home   If you experience worsening of your admission symptoms, develop shortness of breath, life threatening emergency, suicidal or homicidal thoughts you must seek medical attention immediately by calling 911 or calling your MD immediately  if symptoms less severe.  You Must read complete instructions/literature along with all the  possible adverse reactions/side effects for all the Medicines you take and that have been prescribed to you. Take any new Medicines after you have completely understood and accpet all the possible adverse reactions/side effects.   Please note  You were cared for by a hospitalist during your hospital stay. If you have any questions about your discharge medications or the care you received while you were in the hospital after you are discharged, you can call the unit and asked to speak with the hospitalist on call if the hospitalist that took care of you is not available. Once you are discharged, your primary care physician will handle any further medical issues. Please note that NO REFILLS for any discharge medications will be authorized once you are discharged, as it is imperative that you return to your primary care physician (or establish a relationship with a primary care physician if you do not have one) for your aftercare needs so that they can reassess your need for medications and monitor your lab values.    On the day of Discharge:  VITAL SIGNS:  Blood pressure (!) 158/93, pulse 88, temperature 98.8 F (37.1 C), temperature source Oral, resp. rate 18, height 5\' 3"  (1.6 m), weight 65.3 kg, SpO2 97 %. PHYSICAL EXAMINATION:  GENERAL:  72 y.o.-year-old patient lying in the bed with no acute distress. EYES: Pupils equal, round, reactive to light and accommodation. No scleral icterus. Extraocular muscles intact.  HEENT: Head atraumatic, normocephalic. Oropharynx and nasopharynx clear.  NECK:  Supple, no jugular venous distention. No thyroid enlargement, no tenderness.  LUNGS: Normal breath sounds bilaterally, no wheezing, rales,rhonchi or crepitation. No use of accessory muscles of respiration.  CARDIOVASCULAR: S1, S2 normal. No murmurs, rubs, or gallops.  ABDOMEN: Soft, non-tender, non-distended. Bowel sounds present. No organomegaly or mass.  EXTREMITIES: No pedal edema, cyanosis, or  clubbing.  NEUROLOGIC: Cranial nerves II through XII are intact. + Global weakness. Sensation intact. Gait not checked.  PSYCHIATRIC: The patient is alert and oriented x 3.  SKIN: No obvious rash, lesion, or ulcer.  DATA REVIEW:   CBC Recent Labs  Lab 07/09/18 0302  WBC 9.9  HGB 10.1*  HCT 31.8*  PLT 211    Chemistries  Recent Labs  Lab 07/09/18 0302  NA 140  K 4.0  CL 108  CO2 23  GLUCOSE 97  BUN 18  CREATININE 1.08*  CALCIUM 8.8*     Microbiology Results  Results for orders placed or performed during the hospital encounter of 07/06/18  Urine culture     Status: Abnormal   Collection Time: 07/06/18 11:50 AM  Result Value Ref Range Status   Specimen Description   Final    URINE, RANDOM Performed at [facility], [address] [place], [place] [address]    Special Requests   Final    NONE Performed at [facility], [address] [place], [place] [address]  Culture >=100,000 COLONIES/mL ESCHERICHIA COLI (A)  Final   Report Status 07/09/2018 FINAL  Final   Organism ID, Bacteria ESCHERICHIA COLI (A)  Final      Susceptibility   Escherichia coli - MIC*    AMPICILLIN >=32 RESISTANT Resistant     CEFAZOLIN <=4 SENSITIVE Sensitive     CEFTRIAXONE <=1 SENSITIVE Sensitive     CIPROFLOXACIN >=4 RESISTANT Resistant     GENTAMICIN <=1 SENSITIVE Sensitive     IMIPENEM <=0.25 SENSITIVE Sensitive     NITROFURANTOIN <=16 SENSITIVE Sensitive     TRIMETH/SULFA <=20 SENSITIVE Sensitive     AMPICILLIN/SULBACTAM 16 INTERMEDIATE Intermediate     PIP/TAZO <=4 SENSITIVE Sensitive     Extended ESBL NEGATIVE Sensitive     * >=100,000 COLONIES/mL ESCHERICHIA COLI  Culture, blood (single) w Reflex to ID Panel     Status: None (Preliminary result)   Collection Time: 07/06/18 11:50 AM  Result Value Ref Range Status   Specimen Description BLOOD L HAND  Final   Special Requests   Final    BOTTLES DRAWN AEROBIC ONLY Blood Culture results may not be optimal  due to an inadequate volume of blood received in culture bottles   Culture   Final    NO GROWTH 3 DAYS Performed at [facility], [address], [place], [place] [address]    Report Status PENDING  Incomplete  Culture, blood (routine x 2)     Status: None (Preliminary result)   Collection Time: 07/06/18  5:53 PM  Result Value Ref Range Status   Specimen Description BLOOD L HAND  Final   Special Requests   Final    BOTTLES DRAWN AEROBIC AND ANAEROBIC Blood Culture results may not be optimal due to an inadequate volume of blood received in culture bottles   Culture   Final    NO GROWTH 3 DAYS Performed at [facility], [address], [place], [place] [address]    Report Status PENDING  Incomplete    RADIOLOGY:  Mr Brain Wo Contrast  Result Date: 07/09/2018 CLINICAL DATA:  Initial evaluation for acute syncopal episode. EXAM: MRI HEAD WITHOUT CONTRAST TECHNIQUE: Multiplanar, multiecho pulse sequences of the brain and surrounding structures were obtained without intravenous contrast. COMPARISON:  Comparison made with prior CT from 06/24/2018 as well as recent MRI from 05/21/2018. FINDINGS: Brain: Diffuse prominence of the CSF containing spaces compatible with generalized cerebral atrophy. Confluent T2/FLAIR hyperintensity within the periventricular and deep white matter both cerebral hemispheres most consistent with chronic microvascular ischemic disease, fairly advanced in nature. Superimposed small remote cortical infarcts seen involving the anterior left frontal lobe, right temporal lobe, and left occipital pole, stable from previous. Associated T2 shine through noted about the left occipital lobe infarct, also unchanged. Few additional scattered superimposed remote lacunar infarcts within the hemispheric cerebral white matter, pons, and left cerebellum. No abnormal foci of restricted diffusion to suggest acute or subacute ischemia. Gray-white matter differentiation  otherwise maintained. No foci of susceptibility artifact to suggest acute or chronic intracranial hemorrhage. No mass lesion, midline shift or mass effect. Mild diffuse ventricular prominence related to global parenchymal volume loss of hydrocephalus. No extra-axial fluid collection. Pituitary gland within normal limits. Vascular: Major intracranial vascular flow voids maintained. Skull and upper cervical spine: Craniocervical junction within normal limits. Multilevel cervical spondylolysis noted within the upper cervical spine, incompletely evaluated on this exam. Bone marrow signal intensity within normal limits. No scalp soft tissue abnormality. Sinuses/Orbits: Globes and orbital soft tissues within  normal limits. Scattered mucosal thickening throughout the ethmoidal air cells and maxillary sinuses. Small air-fluid levels noted within the left maxillary and sphenoid sinuses. Trace bilateral mastoid effusions, of doubtful significance. Other: None. IMPRESSION: 1. No acute intracranial abnormality identified. 2. Small chronic infarcts involving the left occipital lobe, right lateral temporal lobe, and left anterior frontal lobe, stable from previous. 3. Underlying generalized age-related cerebral atrophy with advanced chronic microvascular disease, stable. Electronically Signed   By: Rise Mu M.D.   On: 07/09/2018 02:22     Management plans discussed with the patient, family and they are in agreement.  CODE STATUS: Full Code   TOTAL TIME TAKING CARE OF THIS PATIENT: 35 minutes.    Jinny Blossom Mayo M.D on 07/09/2018 at 12:35 PM  Between 7am to 6pm - Pager - [phone]  After 6pm go to [email or web address] - Social research officer, government  [facility]  Office  [phone]  CC: Primary care physician; Oswaldo Conroy, MD   Note: This dictation was prepared with Dragon dictation along with smaller phrase technology. Any transcriptional errors that result from this process  are unintentional.

## 2018-07-09 NOTE — Clinical Social Work Note (Signed)
CSW consulted for SNF placement. Patient and daughter would prefer patient go home. RNCM is aware. CSW signing off. Please re consult if further needs arise.   Ruthe Mannanandace Rondi Ivy MSW, [address] [phone]

## 2018-07-09 NOTE — Care Management Note (Signed)
Case Management Note  Patient Details  Name: Destiny BeringLinda M Mack MRN: [id number] Date of Birth: 11-26-1945  Subjective/Objective:  Patient to be discharged per MD order. Orders in place for home health services. [facility] and is set to resume care with them. DME hoyer lift ordered through [facility]. Delievery set to come to the home, provided the daughter with the number to schedule a drop off time. Patient to discharge via EMS.                    Action/Plan:   Expected Discharge Date:  07/09/18               Expected Discharge Plan:  Home w Home Health Services  In-House Referral:     Discharge planning Services  CM Consult  Post Acute Care Choice:  Resumption of Svcs/PTA Provider Choice offered to:  Patient  DME Arranged:  Other see comment(hoyer lift) DME Agency:  [facility]  HH Arranged:  RN, PT, Nurse's Aide, Social Work [facility]  [facility]  Status of Service:  Completed, signed off  If discussed at [employer] of [employer] Meetings, dates discussed:    Additional Comments:  Virgel ManifoldJosh A Naveed Humphres, RN 07/09/2018, 2:19 PM

## 2018-07-11 LAB — CULTURE, BLOOD (SINGLE): CULTURE: NO GROWTH

## 2018-07-11 LAB — CULTURE, BLOOD (ROUTINE X 2): CULTURE: NO GROWTH

## 2018-07-13 LAB — BLOOD GAS, ARTERIAL
ACID-BASE DEFICIT: 7.2 mmol/L — AB (ref 0.0–2.0)
Bicarbonate: 17.9 mmol/L — ABNORMAL LOW (ref 20.0–28.0)
FIO2: 0.4
O2 Saturation: 99.4 %
PEEP: 5 cmH2O
PH ART: 7.33 — AB (ref 7.350–7.450)
PO2 ART: 162 mmHg — AB (ref 83.0–108.0)
Patient temperature: 37
RATE: 16 resp/min
VT: 450 mL
pCO2 arterial: 34 mmHg (ref 32.0–48.0)

## 2018-07-19 ENCOUNTER — Emergency Department
Admission: EM | Admit: 2018-07-19 | Discharge: 2018-07-19 | Disposition: A | Discharge status: Home / Self Care | Payer: Medicare HMO | Attending: Student in an Organized Health Care Education/Training Program | Admitting: Student in an Organized Health Care Education/Training Program

## 2018-07-19 ENCOUNTER — Emergency Department: Payer: Medicare HMO

## 2018-07-19 ENCOUNTER — Other Ambulatory Visit: Payer: Self-pay

## 2018-07-19 DIAGNOSIS — F17210 Nicotine dependence, cigarettes, uncomplicated: Secondary | ICD-10-CM | POA: Diagnosis not present

## 2018-07-19 DIAGNOSIS — R531 Weakness: Secondary | ICD-10-CM | POA: Diagnosis present

## 2018-07-19 LAB — URINE DRUG SCREEN, QUALITATIVE (ARMC ONLY)
Amphetamines, Ur Screen: NOT DETECTED
BARBITURATES, UR SCREEN: NOT DETECTED
Benzodiazepine, Ur Scrn: NOT DETECTED
CANNABINOID 50 NG, UR ~~LOC~~: NOT DETECTED
COCAINE METABOLITE, UR ~~LOC~~: NOT DETECTED
MDMA (Ecstasy)Ur Screen: NOT DETECTED
Methadone Scn, Ur: NOT DETECTED
OPIATE, UR SCREEN: NOT DETECTED
Phencyclidine (PCP) Ur S: NOT DETECTED
Tricyclic, Ur Screen: NOT DETECTED

## 2018-07-19 LAB — URINALYSIS, COMPLETE (UACMP) WITH MICROSCOPIC
BACTERIA UA: NONE SEEN
BILIRUBIN URINE: NEGATIVE
Glucose, UA: NEGATIVE mg/dL
Ketones, ur: NEGATIVE mg/dL
NITRITE: NEGATIVE
PH: 5 (ref 5.0–8.0)
Protein, ur: NEGATIVE mg/dL
RBC / HPF: >50 RBC/hpf — ABNORMAL HIGH (ref 0–5)
Specific Gravity, Urine: 1.015 (ref 1.005–1.030)

## 2018-07-19 LAB — HEPATIC FUNCTION PANEL
ALBUMIN: 2.9 g/dL — AB (ref 3.5–5.0)
ALT: 15 U/L (ref 0–44)
AST: 24 U/L (ref 15–41)
Alkaline Phosphatase: 65 U/L (ref 38–126)
BILIRUBIN TOTAL: 0.5 mg/dL (ref 0.3–1.2)
Bilirubin, Direct: 0.2 mg/dL (ref 0.0–0.2)
Indirect Bilirubin: 0.3 mg/dL (ref 0.3–0.9)
Total Protein: 5.8 g/dL — ABNORMAL LOW (ref 6.5–8.1)

## 2018-07-19 LAB — TROPONIN I
Troponin I: 0.03 ng/mL (ref <0.03)
Troponin I: 0.03 ng/mL (ref <0.03)

## 2018-07-19 LAB — BASIC METABOLIC PANEL
Anion gap: 8 (ref 5–15)
BUN: 39 mg/dL — AB (ref 8–23)
CHLORIDE: 114 mmol/L — AB (ref 98–111)
CO2: 20 mmol/L — ABNORMAL LOW (ref 22–32)
Calcium: 8 mg/dL — ABNORMAL LOW (ref 8.9–10.3)
Creatinine, Ser: 1.17 mg/dL — ABNORMAL HIGH (ref 0.44–1.00)
GFR calc Af Amer: 52 mL/min — ABNORMAL LOW (ref >60)
GFR calc non Af Amer: 45 mL/min — ABNORMAL LOW (ref >60)
Glucose, Bld: 89 mg/dL (ref 70–99)
Potassium: 4.7 mmol/L (ref 3.5–5.1)
SODIUM: 142 mmol/L (ref 135–145)

## 2018-07-19 LAB — CBC
HEMATOCRIT: 34.5 % — AB (ref 36.0–46.0)
Hemoglobin: 10.7 g/dL — ABNORMAL LOW (ref 12.0–15.0)
MCH: 30.4 pg (ref 26.0–34.0)
MCHC: 31 g/dL (ref 30.0–36.0)
MCV: 98 fL (ref 80.0–100.0)
Platelets: 152 10*3/uL (ref 150–400)
RBC: 3.52 MIL/uL — ABNORMAL LOW (ref 3.87–5.11)
RDW: 14.8 % (ref 11.5–15.5)
WBC: 8 10*3/uL (ref 4.0–10.5)
nRBC: 0 % (ref 0.0–0.2)

## 2018-07-19 MED ORDER — SODIUM CHLORIDE 0.9 % IV BOLUS
500.0000 mL | Freq: Once | INTRAVENOUS | Status: AC
  Administered 2018-07-19: 500 mL via INTRAVENOUS

## 2018-07-19 NOTE — ED Notes (Signed)
PT in CT at this time

## 2018-07-19 NOTE — ED Provider Notes (Signed)
[facility] Emergency Department Provider Note    First MD Initiated Contact with Patient 07/19/18 1020     (approximate)  I have reviewed the triage vital signs and the nursing notes.   HISTORY  Chief Complaint Weakness    HPI Destiny Mack is a 72 y.o. female below listed past medical history presents to the ER for increasing sleepiness decreased appetite.  Reportedly has a history of UTI pneumonia for which she presented with similar symptoms.  No report of any fevers.  Patient denies any pain.  No nausea or vomiting.  Patient states, when asked why she was brought to the ER ," I have pneumonia ".  She denies any cough or shortness of breath.  ECHO 10/19: Study Conclusions  - Left ventricle: The cavity size was normal. Wall thickness could   not be accurately determined. Systolic function was vigorous. The   estimated ejection fraction was in the range of 65% to 70%. - Mitral valve: Calcified annulus. - Right ventricle: The cavity size was normal. Systolic function   was normal. - Pericardium, extracardiac: A trivial pericardial effusion was   identified.   Past Medical History:  Diagnosis Date  . A-fib (HCC)   . Depression   . Frequent falls   . Hypertension   . Leaking of urine   . Mixed incontinence   . Stroke (HCC)   . Tremor of hands and face    Family History  Problem Relation Age of Onset  . Dementia Mother   . Heart failure Mother   . Heart failure Father   . CAD Father    Past Surgical History:  Procedure Laterality Date  . APPENDECTOMY    . TEE WITHOUT CARDIOVERSION N/A 01/04/2018   Procedure: TRANSESOPHAGEAL ECHOCARDIOGRAM (TEE);  Surgeon: Lamar Blinks, MD;  Location: [facility] ORS;  Service: Cardiovascular;  Laterality: N/A;  . TUBAL LIGATION     Patient Active Problem List   Diagnosis Date Noted  . Syncope 07/06/2018  . Acute lower UTI 07/06/2018  . UTI (urinary tract infection) 07/06/2018  . Sepsis (HCC)  06/24/2018  . CVA (cerebral vascular accident) (HCC) 05/20/2018  . Malnutrition of moderate degree 01/01/2018  . Pneumonia 12/31/2017  . Hypotension 12/13/2017  . Diarrhea 11/22/2017  . Campylobacter diarrhea 11/22/2017  . Acute encephalopathy 10/29/2017  . Pressure injury of skin 10/29/2017      Prior to Admission medications   Medication Sig Start Date End Date Taking? Authorizing Provider  amLODipine (NORVASC) 5 MG tablet Take 5 mg by mouth daily.    [provider]  apixaban (ELIQUIS) 5 MG TABS tablet Take 1 tablet (5 mg total) by mouth 2 (two) times daily. 12/15/17 07/06/18  Ihor Austin, MD  atorvastatin (LIPITOR) 20 MG tablet Take 20 mg by mouth at bedtime.     [provider]  cephALEXin (KEFLEX) 500 MG capsule Take 1 capsule (500 mg total) by mouth 2 (two) times daily. 07/09/18   Mayo, Allyn Kenner, MD    Allergies Patient has no known allergies.    Social History Social History   Tobacco Use  . Smoking status: Current Every Day Smoker    Packs/day: 2.00    Years: 56.00    Pack years: 112.00    Last attempt to quit: 11/22/2017    Years since quitting: 0.6  . Smokeless tobacco: Never Used  Substance Use Topics  . Alcohol use: No    Frequency: Never  . Drug use: No  Review of Systems Patient denies headaches, rhinorrhea, blurry vision, numbness, shortness of breath, chest pain, edema, cough, abdominal pain, nausea, vomiting, diarrhea, dysuria, fevers, rashes or hallucinations unless otherwise stated above in HPI. ____________________________________________   PHYSICAL EXAM:  VITAL SIGNS: Vitals:   07/19/18 1301 07/19/18 1330  BP:  (!) 123/52  Pulse:  (!) 56  Resp: 11 12  Temp:    SpO2:  98%    Constitutional: Alert, pleasant, in NAD Eyes: Conjunctivae are normal.  Head: Atraumatic. Nose: No congestion/rhinnorhea. Mouth/Throat: Mucous membranes are moist.   Neck: No stridor. Painless ROM.  Cardiovascular: Normal rate, regular  rhythm. Grossly normal heart sounds.  Good peripheral circulation. Respiratory: Normal respiratory effort.  No retractions. Lungs CTAB. Gastrointestinal: Soft and nontender. No distention. No abdominal bruits. No CVA tenderness. Genitourinary:  Musculoskeletal: No lower extremity tenderness nor edema.  No joint effusions. Neurologic:   No gross focal neurologic deficits are appreciated. No facial droop Skin:  Skin is warm, dry and intact. No rash noted. Psychiatric: pleasant and cooperative  ____________________________________________   LABS (all labs ordered are listed, but only abnormal results are displayed)  Results for orders placed or performed during the hospital encounter of 07/19/18 (from the past 24 hour(s))  Basic metabolic panel     Status: Abnormal   Collection Time: 07/19/18 10:05 AM  Result Value Ref Range   Sodium 142 135 - 145 mmol/L   Potassium 4.7 3.5 - 5.1 mmol/L   Chloride 114 (H) 98 - 111 mmol/L   CO2 20 (L) 22 - 32 mmol/L   Glucose, Bld 89 70 - 99 mg/dL   BUN 39 (H) 8 - 23 mg/dL   Creatinine, Ser 1.61 (H) 0.44 - 1.00 mg/dL   Calcium 8.0 (L) 8.9 - 10.3 mg/dL   GFR calc non Af Amer 45 (L) >60 mL/min   GFR calc Af Amer 52 (L) >60 mL/min   Anion gap 8 5 - 15  CBC     Status: Abnormal   Collection Time: 07/19/18 10:05 AM  Result Value Ref Range   WBC 8.0 4.0 - 10.5 K/uL   RBC 3.52 (L) 3.87 - 5.11 MIL/uL   Hemoglobin 10.7 (L) 12.0 - 15.0 g/dL   HCT 09.6 (L) 04.5 - 40.9 %   MCV 98.0 80.0 - 100.0 fL   MCH 30.4 26.0 - 34.0 pg   MCHC 31.0 30.0 - 36.0 g/dL   RDW 81.1 91.4 - 78.2 %   Platelets 152 150 - 400 K/uL   nRBC 0.0 0.0 - 0.2 %  Hepatic function panel     Status: Abnormal   Collection Time: 07/19/18 10:05 AM  Result Value Ref Range   Total Protein 5.8 (L) 6.5 - 8.1 g/dL   Albumin 2.9 (L) 3.5 - 5.0 g/dL   AST 24 15 - 41 U/L   ALT 15 0 - 44 U/L   Alkaline Phosphatase 65 38 - 126 U/L   Total Bilirubin 0.5 0.3 - 1.2 mg/dL   Bilirubin, Direct 0.2 0.0 -  0.2 mg/dL   Indirect Bilirubin 0.3 0.3 - 0.9 mg/dL  Troponin I - Add-On to previous collection     Status: Abnormal   Collection Time: 07/19/18 10:05 AM  Result Value Ref Range   Troponin I 0.03 (HH) <0.03 ng/mL  Urinalysis, Complete w Microscopic     Status: Abnormal   Collection Time: 07/19/18 10:10 AM  Result Value Ref Range   Color, Urine YELLOW (A) YELLOW   APPearance CLEAR (A) CLEAR  Specific Gravity, Urine 1.015 1.005 - 1.030   pH 5.0 5.0 - 8.0   Glucose, UA NEGATIVE NEGATIVE mg/dL   Hgb urine dipstick LARGE (A) NEGATIVE   Bilirubin Urine NEGATIVE NEGATIVE   Ketones, ur NEGATIVE NEGATIVE mg/dL   Protein, ur NEGATIVE NEGATIVE mg/dL   Nitrite NEGATIVE NEGATIVE   Leukocytes, UA TRACE (A) NEGATIVE   RBC / HPF >50 (H) 0 - 5 RBC/hpf   WBC, UA 11-20 0 - 5 WBC/hpf   Bacteria, UA NONE SEEN NONE SEEN   Squamous Epithelial / LPF 0-5 0 - 5  Urine Drug Screen, Qualitative ([facility] only)     Status: None   Collection Time: 07/19/18 10:18 AM  Result Value Ref Range   Tricyclic, Ur Screen NONE DETECTED NONE DETECTED   Amphetamines, Ur Screen NONE DETECTED NONE DETECTED   MDMA (Ecstasy)Ur Screen NONE DETECTED NONE DETECTED   Cocaine Metabolite,Ur [place] NONE DETECTED NONE DETECTED   Opiate, Ur Screen NONE DETECTED NONE DETECTED   Phencyclidine (PCP) Ur S NONE DETECTED NONE DETECTED   Cannabinoid 50 Ng, Ur [place] NONE DETECTED NONE DETECTED   Barbiturates, Ur Screen NONE DETECTED NONE DETECTED   Benzodiazepine, Ur Scrn NONE DETECTED NONE DETECTED   Methadone Scn, Ur NONE DETECTED NONE DETECTED  Troponin I - ONCE - STAT     Status: Abnormal   Collection Time: 07/19/18  2:15 PM  Result Value Ref Range   Troponin I 0.03 (HH) <0.03 ng/mL   ____________________________________________  EKG My review and personal interpretation at Time: 10:02   Indication: weak  Rate: 60  Rhythm: sinus Axis: normal Other: nonspecific st and t wave abn, different from 10/20 however more similar to previous tracing  3/19 ____________________________________________  RADIOLOGY  I personally reviewed all radiographic images ordered to evaluate for the above acute complaints and reviewed radiology reports and findings.  These findings were personally discussed with the patient.  Please see medical record for radiology report.  ____________________________________________   PROCEDURES  Procedure(s) performed:  Procedures    Critical Care performed: no ____________________________________________   INITIAL IMPRESSION / ASSESSMENT AND PLAN / ED COURSE  Pertinent labs & imaging results that were available during my care of the patient were reviewed by me and considered in my medical decision making (see chart for details).   DDX: dehydration, electrolyte abn, anemia, sepsis, uti, pna, acs  Destiny Mack is a 72 y.o. who presents to the ED with symptoms as described above.  Patient nontoxic-appearing.  Hemodynamically stable.  Denies any chest pain or shortness of breath but does have new EKG findings as compared to previous.  Will add on troponin.  Blood work thus far is otherwise reassuring.  No evidence of UTI or pneumonia.  The patient will be placed on continuous pulse oximetry and telemetry for monitoring.  Laboratory evaluation will be sent to evaluate for the above complaints.  Clinical Course as of Jul 20 1503  Fri Jul 19, 2018  1423 Disussed case presentation with patient's daughter and caregiver at home.  She denies any additional concerns.  Discussed results obtained thus far and reassuring work-up.  Patient has no complaints at this time..  Discussed possibility of needing to go to skilled nursing facility patient and family are refusing this preferring to go home.  At this point, i do not see any indication for admission to the hospital.  Currently awaiting repeat troponin.  Patient has some nonspecific EKG changes new from prior but I doubt coronary syndrome or ischemia.  CT head is  reassuring.  We will repeat cardiac enzyme.  No evidence of recurrent UTI or sepsis.   [PR]  1456 Repeat troponin is unchanged.  The patient remains hemodynamically stable and in no acute distress.  At this point I do believe she stable and appropriate for discharge back to home.   [PR]    Clinical Course User Index [PR] Willy Eddy, MD     As part of my medical decision making, I reviewed the following data within the electronic MEDICAL RECORD NUMBER Nursing notes reviewed and incorporated, Labs reviewed, notes from prior ED visits.  ____________________________________________   FINAL CLINICAL IMPRESSION(S) / ED DIAGNOSES  Final diagnoses:  Weakness      NEW MEDICATIONS STARTED DURING THIS VISIT:  New Prescriptions   No medications on file     Note:  This document was prepared using Dragon voice recognition software and may include unintentional dictation errors.    Willy Eddy, MD 07/19/18 1504

## 2018-07-19 NOTE — ED Triage Notes (Signed)
Pt from home via ems. Daughter called c/o increased lethargy and decreased appetite. Hx of uti and pneumonia, daughter states this is how she was with her last uti. Pt lethargic but opens eyes when spoken to. vss

## 2018-07-19 NOTE — ED Notes (Signed)
DR. Roxan Hockeyobinson made aware of trop of 0.03, orders for recheck

## 2018-07-19 NOTE — ED Notes (Signed)
RN gave daughter update over phone w/ pt's permission. Pt spoke with daughter, daughter requested RN to contact her with any updates.

## 2018-07-19 NOTE — ED Notes (Signed)
Daughter notified of pt's up for discharge status

## 2018-07-19 NOTE — ED Notes (Signed)
Patient's discharge and follow up information reviewed with patient by ED nursing staff and patient given the opportunity to ask questions pertaining to ED visit and discharge plan of care. Patient advised that should symptoms not continue to improve, resolve entirely, or should new symptoms develop then a follow up visit with their PCP or a return visit to the ED may be warranted. Patient verbalized consent and understanding of discharge plan of care including potential need for further evaluation. Patient discharged in stable condition per attending ED physician on duty.   Pt returning home via [facility]. Pt's daughter to wait for pt arrival home.

## 2018-07-19 NOTE — ED Notes (Signed)
Pt discharged from ED 1615.

## 2018-08-28 ENCOUNTER — Other Ambulatory Visit: Payer: Self-pay

## 2018-08-28 ENCOUNTER — Encounter: Payer: Self-pay | Admitting: Emergency Medicine

## 2018-08-28 ENCOUNTER — Inpatient Hospital Stay
Admission: EM | Admit: 2018-08-28 | Discharge: 2018-08-31 | DRG: 683 | Disposition: A | Discharge status: Home / Self Care | Payer: Medicare HMO | Attending: Internal Medicine | Admitting: Internal Medicine

## 2018-08-28 DIAGNOSIS — B9620 Unspecified Escherichia coli [E. coli] as the cause of diseases classified elsewhere: Secondary | ICD-10-CM | POA: Diagnosis present

## 2018-08-28 DIAGNOSIS — N179 Acute kidney failure, unspecified: Secondary | ICD-10-CM

## 2018-08-28 DIAGNOSIS — Y846 Urinary catheterization as the cause of abnormal reaction of the patient, or of later complication, without mention of misadventure at the time of the procedure: Secondary | ICD-10-CM | POA: Diagnosis present

## 2018-08-28 DIAGNOSIS — R32 Unspecified urinary incontinence: Secondary | ICD-10-CM | POA: Diagnosis present

## 2018-08-28 DIAGNOSIS — Z7901 Long term (current) use of anticoagulants: Secondary | ICD-10-CM | POA: Diagnosis not present

## 2018-08-28 DIAGNOSIS — E785 Hyperlipidemia, unspecified: Secondary | ICD-10-CM | POA: Diagnosis present

## 2018-08-28 DIAGNOSIS — B965 Pseudomonas (aeruginosa) (mallei) (pseudomallei) as the cause of diseases classified elsewhere: Secondary | ICD-10-CM | POA: Diagnosis present

## 2018-08-28 DIAGNOSIS — Z87440 Personal history of urinary (tract) infections: Secondary | ICD-10-CM

## 2018-08-28 DIAGNOSIS — N390 Urinary tract infection, site not specified: Secondary | ICD-10-CM

## 2018-08-28 DIAGNOSIS — I480 Paroxysmal atrial fibrillation: Secondary | ICD-10-CM | POA: Diagnosis present

## 2018-08-28 DIAGNOSIS — Z8249 Family history of ischemic heart disease and other diseases of the circulatory system: Secondary | ICD-10-CM

## 2018-08-28 DIAGNOSIS — T83511A Infection and inflammatory reaction due to indwelling urethral catheter, initial encounter: Secondary | ICD-10-CM | POA: Diagnosis present

## 2018-08-28 DIAGNOSIS — N19 Unspecified kidney failure: Secondary | ICD-10-CM | POA: Diagnosis present

## 2018-08-28 DIAGNOSIS — E860 Dehydration: Secondary | ICD-10-CM | POA: Diagnosis present

## 2018-08-28 DIAGNOSIS — Z8673 Personal history of transient ischemic attack (TIA), and cerebral infarction without residual deficits: Secondary | ICD-10-CM | POA: Diagnosis not present

## 2018-08-28 DIAGNOSIS — I10 Essential (primary) hypertension: Secondary | ICD-10-CM | POA: Diagnosis present

## 2018-08-28 DIAGNOSIS — F17210 Nicotine dependence, cigarettes, uncomplicated: Secondary | ICD-10-CM | POA: Diagnosis present

## 2018-08-28 LAB — URINALYSIS, COMPLETE (UACMP) WITH MICROSCOPIC
Bilirubin Urine: NEGATIVE
Glucose, UA: NEGATIVE mg/dL
KETONES UR: NEGATIVE mg/dL
Nitrite: NEGATIVE
Protein, ur: 100 mg/dL — AB
RBC / HPF: >50 RBC/hpf — ABNORMAL HIGH (ref 0–5)
Specific Gravity, Urine: 1.013 (ref 1.005–1.030)
Squamous Epithelial / HPF: NONE SEEN (ref 0–5)
WBC, UA: >50 WBC/hpf — ABNORMAL HIGH (ref 0–5)
pH: 9 — ABNORMAL HIGH (ref 5.0–8.0)

## 2018-08-28 LAB — BASIC METABOLIC PANEL
Anion gap: 6 (ref 5–15)
BUN: 46 mg/dL — ABNORMAL HIGH (ref 8–23)
CALCIUM: 8.2 mg/dL — AB (ref 8.9–10.3)
CO2: 13 mmol/L — ABNORMAL LOW (ref 22–32)
Chloride: 121 mmol/L — ABNORMAL HIGH (ref 98–111)
Creatinine, Ser: 1.91 mg/dL — ABNORMAL HIGH (ref 0.44–1.00)
GFR calc Af Amer: 30 mL/min — ABNORMAL LOW (ref >60)
GFR calc non Af Amer: 26 mL/min — ABNORMAL LOW (ref >60)
Glucose, Bld: 97 mg/dL (ref 70–99)
Potassium: 4.5 mmol/L (ref 3.5–5.1)
Sodium: 140 mmol/L (ref 135–145)

## 2018-08-28 LAB — CBC
HEMATOCRIT: 36.4 % (ref 36.0–46.0)
Hemoglobin: 11.7 g/dL — ABNORMAL LOW (ref 12.0–15.0)
MCH: 30.5 pg (ref 26.0–34.0)
MCHC: 32.1 g/dL (ref 30.0–36.0)
MCV: 95 fL (ref 80.0–100.0)
Platelets: 205 10*3/uL (ref 150–400)
RBC: 3.83 MIL/uL — ABNORMAL LOW (ref 3.87–5.11)
RDW: 15.5 % (ref 11.5–15.5)
WBC: 11.3 10*3/uL — ABNORMAL HIGH (ref 4.0–10.5)
nRBC: 0 % (ref 0.0–0.2)

## 2018-08-28 LAB — COMPREHENSIVE METABOLIC PANEL
ALBUMIN: 3.8 g/dL (ref 3.5–5.0)
ALK PHOS: 75 U/L (ref 38–126)
ALT: 10 U/L (ref 0–44)
ANION GAP: 10 (ref 5–15)
AST: 14 U/L — ABNORMAL LOW (ref 15–41)
BUN: 52 mg/dL — ABNORMAL HIGH (ref 8–23)
CO2: 12 mmol/L — AB (ref 22–32)
CREATININE: 2.11 mg/dL — AB (ref 0.44–1.00)
Calcium: 9.3 mg/dL (ref 8.9–10.3)
Chloride: 116 mmol/L — ABNORMAL HIGH (ref 98–111)
GFR calc Af Amer: 26 mL/min — ABNORMAL LOW (ref >60)
GFR calc non Af Amer: 23 mL/min — ABNORMAL LOW (ref >60)
GLUCOSE: 124 mg/dL — AB (ref 70–99)
Potassium: 4.5 mmol/L (ref 3.5–5.1)
SODIUM: 138 mmol/L (ref 135–145)
Total Bilirubin: 0.6 mg/dL (ref 0.3–1.2)
Total Protein: 7.6 g/dL (ref 6.5–8.1)

## 2018-08-28 LAB — LIPASE, BLOOD: Lipase: 24 U/L (ref 11–51)

## 2018-08-28 MED ORDER — ACETAMINOPHEN 650 MG RE SUPP: 650.0000 mg | Freq: Four times a day (QID) | RECTAL | Status: DC | PRN

## 2018-08-28 MED ORDER — SODIUM CHLORIDE 0.9 % IV BOLUS
1000.0000 mL | Freq: Once | INTRAVENOUS | Status: AC
  Administered 2018-08-28: 1000 mL via INTRAVENOUS

## 2018-08-28 MED ORDER — ONDANSETRON HCL 4 MG PO TABS: 4.0000 mg | ORAL_TABLET | Freq: Four times a day (QID) | ORAL | Status: DC | PRN

## 2018-08-28 MED ORDER — ATORVASTATIN CALCIUM 20 MG PO TABS
20.0000 mg | ORAL_TABLET | Freq: Every day | ORAL | Status: DC
  Administered 2018-08-28 – 2018-08-31 (×4): 20 mg via ORAL
  Filled 2018-08-28 (×4): qty 1

## 2018-08-28 MED ORDER — SODIUM CHLORIDE 0.9 % IV BOLUS: 500.0000 mL | Freq: Once | INTRAVENOUS | Status: DC

## 2018-08-28 MED ORDER — AMLODIPINE BESYLATE 5 MG PO TABS
5.0000 mg | ORAL_TABLET | Freq: Every day | ORAL | Status: DC
  Administered 2018-08-29 – 2018-08-31 (×3): 5 mg via ORAL
  Filled 2018-08-28 (×3): qty 1

## 2018-08-28 MED ORDER — SENNOSIDES-DOCUSATE SODIUM 8.6-50 MG PO TABS: 1.0000 | ORAL_TABLET | Freq: Every evening | ORAL | Status: DC | PRN

## 2018-08-28 MED ORDER — SODIUM CHLORIDE 0.9 % IV SOLN
1.0000 g | INTRAVENOUS | Status: DC
  Administered 2018-08-29 – 2018-08-31 (×3): 1 g via INTRAVENOUS
  Filled 2018-08-28 (×2): qty 1
  Filled 2018-08-28: qty 10

## 2018-08-28 MED ORDER — APIXABAN 5 MG PO TABS: 5.0000 mg | ORAL_TABLET | Freq: Two times a day (BID) | ORAL | Status: DC

## 2018-08-28 MED ORDER — SODIUM CHLORIDE 0.9 % IV SOLN
1.0000 g | Freq: Once | INTRAVENOUS | Status: AC
  Administered 2018-08-28: 1 g via INTRAVENOUS
  Filled 2018-08-28: qty 10

## 2018-08-28 MED ORDER — SODIUM CHLORIDE 0.9 % IV BOLUS
500.0000 mL | Freq: Once | INTRAVENOUS | Status: AC
  Administered 2018-08-28: 500 mL via INTRAVENOUS

## 2018-08-28 MED ORDER — SODIUM CHLORIDE 0.9 % IV SOLN
INTRAVENOUS | Status: DC
  Administered 2018-08-28 – 2018-08-30 (×5): via INTRAVENOUS

## 2018-08-28 MED ORDER — NICOTINE 21 MG/24HR TD PT24
21.0000 mg | MEDICATED_PATCH | Freq: Every day | TRANSDERMAL | Status: DC
  Filled 2018-08-28 (×2): qty 1

## 2018-08-28 MED ORDER — APIXABAN 2.5 MG PO TABS
2.5000 mg | ORAL_TABLET | Freq: Two times a day (BID) | ORAL | Status: DC
  Administered 2018-08-28 – 2018-08-31 (×7): 2.5 mg via ORAL
  Filled 2018-08-28 (×7): qty 1

## 2018-08-28 MED ORDER — ONDANSETRON HCL 4 MG/2ML IJ SOLN
4.0000 mg | Freq: Four times a day (QID) | INTRAMUSCULAR | Status: DC | PRN
  Administered 2018-08-30 – 2018-08-31 (×2): 4 mg via INTRAVENOUS
  Filled 2018-08-28 (×2): qty 2

## 2018-08-28 MED ORDER — ACETAMINOPHEN 325 MG PO TABS: 650.0000 mg | ORAL_TABLET | Freq: Four times a day (QID) | ORAL | Status: DC | PRN

## 2018-08-28 NOTE — H&P (Signed)
[facility] [place] [facility]   PATIENT NAME: Destiny Mack    MR#:  [id number]  DATE OF BIRTH:  09-Jul-1946  DATE OF ADMISSION:  08/28/2018  PRIMARY CARE PHYSICIAN: Oswaldo Conroy, MD   REQUESTING/REFERRING PHYSICIAN:   CHIEF COMPLAINT:   Chief Complaint  Patient presents with  . Abdominal Pain    HISTORY OF PRESENT ILLNESS: Destiny Mack  is a 73 y.o. female with a known history of urinary incontinence, urinary tract infections in the past, atrial fibrillation, CVA, hypertension on Eliquis presented to the emergency room for abdominal discomfort, dysuria.  Patient has generalized weakness she.  He also had some decreased urine output and little blood around the Foley.  She has indwelling Foley catheter for last several weeks secondary to incontinence.  Foley catheter has been changed in the emergency room.  Urinalysis revealed a infection and she was started on IV antibiotic.  Her creatinine is elevated and patient appears dry and dehydrated.  PAST MEDICAL HISTORY:   Past Medical History:  Diagnosis Date  . A-fib (HCC)   . Depression   . Frequent falls   . Hypertension   . Leaking of urine   . Mixed incontinence   . Stroke (HCC)   . Tremor of hands and face     PAST SURGICAL HISTORY:  Past Surgical History:  Procedure Laterality Date  . APPENDECTOMY    . TEE WITHOUT CARDIOVERSION N/A 01/04/2018   Procedure: TRANSESOPHAGEAL ECHOCARDIOGRAM (TEE);  Surgeon: Lamar Blinks, MD;  Location: [facility] ORS;  Service: Cardiovascular;  Laterality: N/A;  . TUBAL LIGATION      SOCIAL HISTORY:  Social History   Tobacco Use  . Smoking status: Current Every Day Smoker    Packs/day: 2.00    Years: 56.00    Pack years: 112.00    Last attempt to quit: 11/22/2017    Years since quitting: 0.7  . Smokeless tobacco: Never Used  Substance Use Topics  . Alcohol use: No    Frequency: Never    FAMILY HISTORY:  Family History  Problem Relation Age of Onset   . Dementia Mother   . Heart failure Mother   . Heart failure Father   . CAD Father     DRUG ALLERGIES: No Known Allergies  REVIEW OF SYSTEMS:   CONSTITUTIONAL: No fever,has  fatigue and weakness.  EYES: No blurred or double vision.  EARS, NOSE, AND THROAT: No tinnitus or ear pain.  RESPIRATORY: No cough, shortness of breath, wheezing or hemoptysis.  CARDIOVASCULAR: No chest pain, orthopnea, edema.  GASTROINTESTINAL: No nausea, vomiting, diarrhea  mild abdominal pain.  GENITOURINARY: Has dysuria, no hematuria.  ENDOCRINE: No polyuria, nocturia,  HEMATOLOGY: No anemia, easy bruising or bleeding SKIN: No rash or lesion. MUSCULOSKELETAL: No joint pain or arthritis.   NEUROLOGIC: No tingling, numbness, weakness.  PSYCHIATRY: No anxiety or depression.   MEDICATIONS AT HOME:  Prior to Admission medications   Medication Sig Start Date End Date Taking? Authorizing Provider  amLODipine (NORVASC) 5 MG tablet Take 5 mg by mouth daily.    [provider]  apixaban (ELIQUIS) 5 MG TABS tablet Take 1 tablet (5 mg total) by mouth 2 (two) times daily. 12/15/17 07/06/18  Ihor Austin, MD  atorvastatin (LIPITOR) 20 MG tablet Take 20 mg by mouth at bedtime.     [provider]  cephALEXin (KEFLEX) 500 MG capsule Take 1 capsule (500 mg total) by mouth 2 (two) times daily. 07/09/18   Mayo,  Allyn KennerKaty Dodd, MD      PHYSICAL EXAMINATION:   VITAL SIGNS: Blood pressure (!) 113/51, pulse 65, temperature 98.4 F (36.9 C), temperature source Oral, resp. rate 12, height 5\' 4"  (1.626 m), weight 58.1 kg, SpO2 98 %.  GENERAL:  73 y.o.-year-old patient lying in the bed with no acute distress.  EYES: Pupils equal, round, reactive to light and accommodation. No scleral icterus. Extraocular muscles intact.  HEENT: Head atraumatic, normocephalic. Oropharynx dry and nasopharynx clear.  NECK:  Supple, no jugular venous distention. No thyroid enlargement, no tenderness.  LUNGS: Normal breath sounds  bilaterally, no wheezing, rales,rhonchi or crepitation. No use of accessory muscles of respiration.  CARDIOVASCULAR: S1, S2 normal. No murmurs, rubs, or gallops.  ABDOMEN: Soft, mild tenderness supra pubic region, nondistended. Bowel sounds present. No organomegaly or mass.  EXTREMITIES: No pedal edema, cyanosis, or clubbing.  NEUROLOGIC: Cranial nerves II through XII are intact. Muscle strength 5/5 in all extremities. Sensation intact. Gait not checked.  PSYCHIATRIC: The patient is alert and oriented x 3.  SKIN: No obvious rash, lesion, or ulcer.   LABORATORY PANEL:   CBC Recent Labs  Lab 08/28/18 0657  WBC 11.3*  HGB 11.7*  HCT 36.4  PLT 205  MCV 95.0  MCH 30.5  MCHC 32.1  RDW 15.5   ------------------------------------------------------------------------------------------------------------------  Chemistries  Recent Labs  Lab 08/28/18 0657 08/28/18 1112  NA 138 140  K 4.5 4.5  CL 116* 121*  CO2 12* 13*  GLUCOSE 124* 97  BUN 52* 46*  CREATININE 2.11* 1.91*  CALCIUM 9.3 8.2*  AST 14*  --   ALT 10  --   ALKPHOS 75  --   BILITOT 0.6  --    ------------------------------------------------------------------------------------------------------------------ estimated creatinine clearance is 22.7 mL/min (A) (by C-G formula based on SCr of 1.91 mg/dL (H)). ------------------------------------------------------------------------------------------------------------------ No results for input(s): TSH, T4TOTAL, T3FREE, THYROIDAB in the last 72 hours.  Invalid input(s): FREET3   Coagulation profile No results for input(s): INR, PROTIME in the last 168 hours. ------------------------------------------------------------------------------------------------------------------- No results for input(s): DDIMER in the last 72 hours. -------------------------------------------------------------------------------------------------------------------  Cardiac Enzymes No results for  input(s): CKMB, TROPONINI, MYOGLOBIN in the last 168 hours.  Invalid input(s): CK ------------------------------------------------------------------------------------------------------------------ Invalid input(s): POCBNP  ---------------------------------------------------------------------------------------------------------------  Urinalysis    Component Value Date/Time   COLORURINE AMBER (A) 08/28/2018 0833   APPEARANCEUR TURBID (A) 08/28/2018 0833   LABSPEC 1.013 08/28/2018 0833   PHURINE 9.0 (H) 08/28/2018 0833   GLUCOSEU NEGATIVE 08/28/2018 0833   HGBUR MODERATE (A) 08/28/2018 0833   BILIRUBINUR NEGATIVE 08/28/2018 0833   KETONESUR NEGATIVE 08/28/2018 0833   PROTEINUR 100 (A) 08/28/2018 0833   NITRITE NEGATIVE 08/28/2018 0833   LEUKOCYTESUR TRACE (A) 08/28/2018 0833     RADIOLOGY: No results found.  EKG: Orders placed or performed during the hospital encounter of 07/19/18  . ED EKG  . ED EKG  . EKG 12-Lead  . EKG 12-Lead    IMPRESSION AND PLAN:  73 year old female patient with history of atrial fibrillation, urinary incontinence on Foley catheter, UTI in the past, hypertension, tobacco abuse currently under hospitalist service for mild suprapubic discomfort and weakness  -Acute kidney injury secondary to dehydration IV fluids Follow-up creatinine level Avoid nephrotoxic drugs  -Acute urinary tract infection IV Rocephin antibiotic 1 g daily and check urine culture  -Urinary incontinence Foley catheter has been changed in the emergency room  -Tobacco abuse Tobacco cessation counseled to the patient for 6 minutes Nicotine patch offered  -Chronic atrial fibrillation Continue Norvasc  for rate control Resume Eliquis for anticoagulation  -Hyperlipidemia Continue statin medication  All the records are reviewed and case discussed with ED provider. Management plans discussed with the patient, family and they are in agreement.  CODE STATUS:Full code Code  Status History    Date Active Date Inactive Code Status Order ID Comments User Context   07/06/2018 1628 07/09/2018 2237 Full Code [id number]  Altamese Dilling, MD ED   06/24/2018 1648 07/03/2018 1942 Full Code [id number]  Adrian Saran, MD Inpatient   05/20/2018 1847 05/21/2018 2212 Full Code [id number]  Ramonita Lab, MD Inpatient   05/20/2018 1847 05/20/2018 1847 Full Code [id number]  Ramonita Lab, MD Inpatient   12/31/2017 1538 01/04/2018 2130 DNR [id number]  Milagros Loll, MD ED   12/13/2017 1630 12/15/2017 2016 Full Code [id number]  Alford Highland, MD ED   11/22/2017 0202 11/23/2017 2009 DNR [id number]  Salary, Evelena Asa, MD ED   11/22/2017 0150 11/22/2017 0202 Full Code [id number]  Salary, Evelena Asa, MD ED   11/01/2017 1026 11/02/2017 0155 DNR [id number]  Delfino Lovett, MD Inpatient   10/29/2017 1256 11/01/2017 1026 Full Code [id number]  Alford Highland, MD ED       TOTAL TIME TAKING CARE OF THIS PATIENT: 54 minutes.    Ihor Austin M.D on 08/28/2018 at 11:58 AM  Between 7am to 6pm - Pager - [phone]  After 6pm go to [email or web address] - password [email or web address] [facility]  [place] [facility]  Office  [phone]  CC: Primary care physician; Oswaldo Conroy, MD

## 2018-08-28 NOTE — ED Notes (Signed)
Bladder Scan showed 

## 2018-08-28 NOTE — ED Notes (Signed)
Patient transported to room 217

## 2018-08-28 NOTE — Progress Notes (Signed)
Advanced care plan.  Purpose of the Encounter: CODE STATUS  Parties in Attendance: Patient  Patient's Decision Capacity: Good  Subjective/Patient's story: Presented to the emergency room with abdominal discomfort and dysuria   Objective/Medical story Patient has renal insufficiency dehydration, urinary tract infection Needs IV fluids, antibiotics and change of Foley catheter   Goals of care determination:  Advance care directives goals of care and treatment plan discussed Patient for now wants everything done which includes CPR, intubation ventilator the need arises   CODE STATUS: Full code   Time spent discussing advanced care planning: 16 minutes

## 2018-08-28 NOTE — ED Provider Notes (Signed)
[facility] Emergency Department Provider Note  ____________________________________________   I have reviewed the triage vital signs and the nursing notes. Where available I have reviewed prior notes and, if possible and indicated, outside hospital notes.    HISTORY  Chief Complaint Abdominal Pain    HPI Destiny Mack is a 73 y.o. female  States that she is a Foley for the last several weeks because of urinary issues as she puts it, presents today complaining of decreased urine output, little bit of blood around the Foley itself, she apparently was diagnosed by some means with a UTI and will be given a prescription for antibiotics of which she is had 2.  She is not sure the name of the antibiotic.  Nuys any fever or chills, she has little bit of suprapubic discomfort, which is been there since they put the Foley in.  This may be a little worse today.  Does not radiate.  No other complaints.  Foley catheter is dry.  No known fever did have Tylenol this morning somewhat limited historian but doing pretty well   Past Medical History:  Diagnosis Date  . A-fib (HCC)   . Depression   . Frequent falls   . Hypertension   . Leaking of urine   . Mixed incontinence   . Stroke (HCC)   . Tremor of hands and face     Patient Active Problem List   Diagnosis Date Noted  . Syncope 07/06/2018  . Acute lower UTI 07/06/2018  . UTI (urinary tract infection) 07/06/2018  . Sepsis (HCC) 06/24/2018  . CVA (cerebral vascular accident) (HCC) 05/20/2018  . Malnutrition of moderate degree 01/01/2018  . Pneumonia 12/31/2017  . Hypotension 12/13/2017  . Diarrhea 11/22/2017  . Campylobacter diarrhea 11/22/2017  . Acute encephalopathy 10/29/2017  . Pressure injury of skin 10/29/2017    Past Surgical History:  Procedure Laterality Date  . APPENDECTOMY    . TEE WITHOUT CARDIOVERSION N/A 01/04/2018   Procedure: TRANSESOPHAGEAL ECHOCARDIOGRAM (TEE);  Surgeon: Lamar Blinks,  MD;  Location: [facility] ORS;  Service: Cardiovascular;  Laterality: N/A;  . TUBAL LIGATION      Prior to Admission medications   Medication Sig Start Date End Date Taking? Authorizing Provider  amLODipine (NORVASC) 5 MG tablet Take 5 mg by mouth daily.    [provider]  apixaban (ELIQUIS) 5 MG TABS tablet Take 1 tablet (5 mg total) by mouth 2 (two) times daily. 12/15/17 07/06/18  Ihor Austin, MD  atorvastatin (LIPITOR) 20 MG tablet Take 20 mg by mouth at bedtime.     [provider]  cephALEXin (KEFLEX) 500 MG capsule Take 1 capsule (500 mg total) by mouth 2 (two) times daily. 07/09/18   Mayo, Allyn Kenner, MD    Allergies Patient has no known allergies.  Family History  Problem Relation Age of Onset  . Dementia Mother   . Heart failure Mother   . Heart failure Father   . CAD Father     Social History Social History   Tobacco Use  . Smoking status: Current Every Day Smoker    Packs/day: 2.00    Years: 56.00    Pack years: 112.00    Last attempt to quit: 11/22/2017    Years since quitting: 0.7  . Smokeless tobacco: Never Used  Substance Use Topics  . Alcohol use: No    Frequency: Never  . Drug use: No    Review of Systems Constitutional: No fever/chills Eyes: No visual  changes. ENT: No sore throat. No stiff neck no neck pain Cardiovascular: Denies chest pain. Respiratory: Denies shortness of breath. Gastrointestinal:   no vomiting.  No diarrhea.  No constipation. Genitourinary: See HPI Musculoskeletal: Negative lower extremity swelling Skin: Negative for rash. Neurological: Negative for severe headaches, focal weakness or numbness.   ____________________________________________   PHYSICAL EXAM:  VITAL SIGNS: ED Triage Vitals  Enc Vitals Group     BP 08/28/18 0648 116/60     Pulse Rate 08/28/18 0648 81     Resp 08/28/18 0648 20     Temp 08/28/18 0648 (!) 81 F (27.2 C)     Temp Source 08/28/18 0648 Oral     SpO2 08/28/18 0648 98 %      Weight 08/28/18 0652 128 lb (58.1 kg)     Height 08/28/18 0652 5\' 4"  (1.626 m)     Head Circumference --      Peak Flow --      Pain Score 08/28/18 0652 8     Pain Loc --      Pain Edu? --      Excl. in GC? --     Constitutional: Alert and oriented. Well appearing and in no acute distress. HEENT: No congestion/rhinnorhea. Mucous membranes are slightly dry.  Oropharynx non-erythematous Cardiovascular: Normal rate, regular rhythm. Grossly normal heart sounds.  Good peripheral circulation. Respiratory: Normal respiratory effort.  No retractions. Lungs CTAB. Abdominal: Soft and slight suprapubic discomfort. No distention. No guarding no rebound, Foley in place, no drainage noted bladder scan shows 186 Back:  There is no focal tenderness or step off.  there is no midline tenderness there are no lesions noted. there is no CVA tenderness Musculoskeletal: No lower extremity tenderness, no upper extremity tenderness. No joint effusions, no DVT signs strong distal pulses no edema Neurologic:  Normal speech and language. No gross focal neurologic deficits are appreciated.  Skin:  Skin is warm, dry and intact. No rash noted. Psychiatric: Mood and affect are normal. Speech and behavior are normal.  ____________________________________________   LABS (all labs ordered are listed, but only abnormal results are displayed)  Labs Reviewed  COMPREHENSIVE METABOLIC PANEL - Abnormal; Notable for the following components:      Result Value   Chloride 116 (*)    CO2 12 (*)    Glucose, Bld 124 (*)    BUN 52 (*)    Creatinine, Ser 2.11 (*)    AST 14 (*)    GFR calc non Af Amer 23 (*)    GFR calc Af Amer 26 (*)    All other components within normal limits  CBC - Abnormal; Notable for the following components:   WBC 11.3 (*)    RBC 3.83 (*)    Hemoglobin 11.7 (*)    All other components within normal limits  URINE CULTURE  LIPASE, BLOOD  URINALYSIS, COMPLETE (UACMP) WITH MICROSCOPIC    Pertinent  labs  results that were available during my care of the patient were reviewed by me and considered in my medical decision making (see chart for details). ____________________________________________  EKG  I personally interpreted any EKGs ordered by me or triage  ____________________________________________  RADIOLOGY  Pertinent labs & imaging results that were available during my care of the patient were reviewed by me and considered in my medical decision making (see chart for details). If possible, patient and/or family made aware of any abnormal findings.  No results found. ____________________________________________    PROCEDURES  Procedure(s) performed:  None  Procedures  Critical Care performed: None  ____________________________________________   INITIAL IMPRESSION / ASSESSMENT AND PLAN / ED COURSE  Pertinent labs & imaging results that were available during my care of the patient were reviewed by me and considered in my medical decision making (see chart for details).  Patient here with reported UTI in the presence of the Foley, the Foley is not draining probably this is certainly most likely cause of her discomfort.  We will place a Foley which I think will help.  She has some degree of AK I with a creatinine of 2 baseline is lower than that.  This could be post renal, given nonfunctioning Foley.  We will give her fluid as her last echo was normal.  Blood sugar is reassuring.  Vital signs are reassuring, initial temperature is in error, is not the exact same as her pulse, nurses will  will recheck that.    ____________________________________________   FINAL CLINICAL IMPRESSION(S) / ED DIAGNOSES  Final diagnoses:  None      This chart was dictated using voice recognition software.  Despite best efforts to proofread,  errors can occur which can change meaning.      Jeanmarie Plant, MD 08/28/18 [phone]

## 2018-08-28 NOTE — ED Triage Notes (Signed)
Pt presents to ED via EMS from home. tylenol given at 0330. Pt was dx with UTI and has taken two doses of antibiotic. C/o lower abd pressure and leaking urine from her foley catheter.

## 2018-08-28 NOTE — ED Notes (Signed)
Large amount of pink discharge on brief pad.

## 2018-08-28 NOTE — ED Notes (Signed)
Dr. McShane in room to assess patient.  Will continue to monitor.   

## 2018-08-29 LAB — BASIC METABOLIC PANEL
Anion gap: 4 — ABNORMAL LOW (ref 5–15)
BUN: 41 mg/dL — ABNORMAL HIGH (ref 8–23)
CO2: 12 mmol/L — ABNORMAL LOW (ref 22–32)
Calcium: 8.1 mg/dL — ABNORMAL LOW (ref 8.9–10.3)
Chloride: 123 mmol/L — ABNORMAL HIGH (ref 98–111)
Creatinine, Ser: 1.66 mg/dL — ABNORMAL HIGH (ref 0.44–1.00)
GFR calc Af Amer: 35 mL/min — ABNORMAL LOW (ref >60)
GFR calc non Af Amer: 30 mL/min — ABNORMAL LOW (ref >60)
Glucose, Bld: 90 mg/dL (ref 70–99)
Potassium: 4 mmol/L (ref 3.5–5.1)
Sodium: 139 mmol/L (ref 135–145)

## 2018-08-29 LAB — CBC
HCT: 29.9 % — ABNORMAL LOW (ref 36.0–46.0)
Hemoglobin: 9.4 g/dL — ABNORMAL LOW (ref 12.0–15.0)
MCH: 30.7 pg (ref 26.0–34.0)
MCHC: 31.4 g/dL (ref 30.0–36.0)
MCV: 97.7 fL (ref 80.0–100.0)
Platelets: 151 10*3/uL (ref 150–400)
RBC: 3.06 MIL/uL — ABNORMAL LOW (ref 3.87–5.11)
RDW: 15.9 % — ABNORMAL HIGH (ref 11.5–15.5)
WBC: 6.5 10*3/uL (ref 4.0–10.5)
nRBC: 0 % (ref 0.0–0.2)

## 2018-08-29 NOTE — Care Management Note (Addendum)
Case Management Note  Patient Details  Name: Destiny Mack MRN: [id number] Date of Birth: [birth year]   Patient admitted with AKI.  Patient lives at home with daughter.  PCP Bender.  Open with [facility] home health for RN, PT, OT, SW, aide. [name] with [facility] notified of admission. Patient has hoyer lift, hospital bed, WC, and BSC. Daughter provides transportation, as well as Paramedic.  Daughter requested EMS transportation at discharge.  Patient also has CAPS services.  RNCM following   Subjective/Objective:                    Action/Plan:   Expected Discharge Date:                  Expected Discharge Plan:     In-House Referral:     Discharge planning Services     Post Acute Care Choice:    Choice offered to:     DME Arranged:    DME Agency:     HH Arranged:    HH Agency:     Status of Service:     If discussed at [employer] of Stay Meetings, dates discussed:    Additional Comments:  Chapman Fitch, RN 08/29/2018, 11:29 AM

## 2018-08-29 NOTE — Progress Notes (Signed)
[facility] - [place] at [facility]   PATIENT NAME: Destiny Mack    MR#:  [id number]  DATE OF BIRTH:  04-19-1946  SUBJECTIVE:  CHIEF COMPLAINT: Patient is reporting burning and pain from urination associated with some nausea  REVIEW OF SYSTEMS:  CONSTITUTIONAL: No fever, fatigue or weakness.  EYES: No blurred or double vision.  EARS, NOSE, AND THROAT: No tinnitus or ear pain.  RESPIRATORY: No cough, shortness of breath, wheezing or hemoptysis.  CARDIOVASCULAR: No chest pain, orthopnea, edema.  GASTROINTESTINAL: Reporting nausea, denies vomiting, diarrhea or abdominal pain.  GENITOURINARY: Reports dysuria, denies hematuria.  ENDOCRINE: No polyuria, nocturia,  HEMATOLOGY: No anemia, easy bruising or bleeding SKIN: No rash or lesion. MUSCULOSKELETAL: No joint pain or arthritis.   NEUROLOGIC: No tingling, numbness, weakness.  PSYCHIATRY: No anxiety or depression.   DRUG ALLERGIES:  No Known Allergies  VITALS:  Blood pressure (!) 111/57, pulse 84, temperature 97.8 F (36.6 C), temperature source Oral, resp. rate 16, height 5\' 4"  (1.626 m), weight 58.1 kg, SpO2 99 %.  PHYSICAL EXAMINATION:  GENERAL:  73 y.o.-year-old patient lying in the bed with no acute distress.  EYES: Pupils equal, round, reactive to light and accommodation. No scleral icterus. Extraocular muscles intact.  HEENT: Head atraumatic, normocephalic. Oropharynx and nasopharynx clear.  NECK:  Supple, no jugular venous distention. No thyroid enlargement, no tenderness.  LUNGS: Normal breath sounds bilaterally, no wheezing, rales,rhonchi or crepitation. No use of accessory muscles of respiration.  CARDIOVASCULAR: S1, S2 normal. No murmurs, rubs, or gallops.  ABDOMEN: Soft, nontender, nondistended. Bowel sounds present. No organomegaly or mass.  EXTREMITIES: No pedal edema, cyanosis, or clubbing.  NEUROLOGIC: Awake, alert and oriented x3. Sensation intact. Gait not checked.  PSYCHIATRIC: The  patient is alert and oriented x 3.  SKIN: No obvious rash, lesion, or ulcer.    LABORATORY PANEL:   CBC Recent Labs  Lab 08/29/18 0506  WBC 6.5  HGB 9.4*  HCT 29.9*  PLT 151   ------------------------------------------------------------------------------------------------------------------  Chemistries  Recent Labs  Lab 08/28/18 0657  08/29/18 0506  NA 138   < > 139  K 4.5   < > 4.0  CL 116*   < > 123*  CO2 12*   < > 12*  GLUCOSE 124*   < > 90  BUN 52*   < > 41*  CREATININE 2.11*   < > 1.66*  CALCIUM 9.3   < > 8.1*  AST 14*  --   --   ALT 10  --   --   ALKPHOS 75  --   --   BILITOT 0.6  --   --    < > = values in this interval not displayed.   ------------------------------------------------------------------------------------------------------------------  Cardiac Enzymes No results for input(s): TROPONINI in the last 168 hours. ------------------------------------------------------------------------------------------------------------------  RADIOLOGY:  No results found.  EKG:   Orders placed or performed during the hospital encounter of 08/28/18  . EKG 12-Lead  . EKG 12-Lead    ASSESSMENT AND PLAN:    73 year old female patient with history of atrial fibrillation, urinary incontinence on Foley catheter, UTI in the past, hypertension, tobacco abuse currently under hospitalist service for mild suprapubic discomfort and weakness  -Acute kidney injury secondary to dehydration Clinically improving with IV fluids, continue the same Follow-up creatinine level 2.11-1.91-1.66 Avoid nephrotoxic drugs Check a.m. labs  -Acute urinary tract infection IV Rocephin 1 g daily and check urine culture-pending IV fluids  -Urinary incontinence Foley catheter has been changed  in the emergency room  -Tobacco abuse Tobacco cessation counseled to the patient for 6 minutes Nicotine patch offered  -Chronic atrial fibrillation Continue Norvasc for rate  control Resume Eliquis for anticoagulation  -Hyperlipidemia Continue statin medication    All the records are reviewed and case discussed with Care Management/Social Workerr. Management plans discussed with the patient, family and they are in agreement.  CODE STATUS: fc   TOTAL TIME TAKING CARE OF THIS PATIENT: 36  minutes.   POSSIBLE D/C IN 1 DAYS, DEPENDING ON CLINICAL CONDITION.  Note: This dictation was prepared with Dragon dictation along with smaller phrase technology. Any transcriptional errors that result from this process are unintentional.   Ramonita Lab M.D on 08/29/2018 at 4:51 PM  Between 7am to 6pm - Pager - [phone] After 6pm go to [email or web address] - password [email or web address] [facility]  [place]  Office  [phone]  CC: Primary care physician; Oswaldo Conroy, MD

## 2018-08-30 LAB — BASIC METABOLIC PANEL
Anion gap: 5 (ref 5–15)
BUN: 24 mg/dL — ABNORMAL HIGH (ref 8–23)
CO2: 13 mmol/L — AB (ref 22–32)
Calcium: 8.2 mg/dL — ABNORMAL LOW (ref 8.9–10.3)
Chloride: 122 mmol/L — ABNORMAL HIGH (ref 98–111)
Creatinine, Ser: 1.15 mg/dL — ABNORMAL HIGH (ref 0.44–1.00)
GFR calc non Af Amer: 47 mL/min — ABNORMAL LOW (ref >60)
GFR, EST AFRICAN AMERICAN: 55 mL/min — AB (ref >60)
Glucose, Bld: 139 mg/dL — ABNORMAL HIGH (ref 70–99)
Potassium: 3.5 mmol/L (ref 3.5–5.1)
Sodium: 140 mmol/L (ref 135–145)

## 2018-08-30 LAB — CBC
HEMATOCRIT: 29.9 % — AB (ref 36.0–46.0)
HEMOGLOBIN: 9.4 g/dL — AB (ref 12.0–15.0)
MCH: 30.4 pg (ref 26.0–34.0)
MCHC: 31.4 g/dL (ref 30.0–36.0)
MCV: 96.8 fL (ref 80.0–100.0)
Platelets: 157 10*3/uL (ref 150–400)
RBC: 3.09 MIL/uL — ABNORMAL LOW (ref 3.87–5.11)
RDW: 15.7 % — ABNORMAL HIGH (ref 11.5–15.5)
WBC: 7 10*3/uL (ref 4.0–10.5)
nRBC: 0 % (ref 0.0–0.2)

## 2018-08-30 MED ORDER — APIXABAN 2.5 MG PO TABS: 2.5000 mg | ORAL_TABLET | Freq: Two times a day (BID) | ORAL | 0 refills | Status: DC

## 2018-08-30 MED ORDER — NICOTINE 21 MG/24HR TD PT24: 21.0000 mg | MEDICATED_PATCH | Freq: Every day | TRANSDERMAL | 0 refills | Status: AC

## 2018-08-30 MED ORDER — CEPHALEXIN 500 MG PO CAPS: 500.0000 mg | ORAL_CAPSULE | Freq: Two times a day (BID) | ORAL | 0 refills | Status: DC

## 2018-08-30 MED ORDER — SENNOSIDES-DOCUSATE SODIUM 8.6-50 MG PO TABS: 1.0000 | ORAL_TABLET | Freq: Every evening | ORAL | Status: AC | PRN

## 2018-08-30 NOTE — Progress Notes (Signed)
Assumed care of patient. No acute problems noted. No complaints at this time. Will continue to monitor.

## 2018-08-30 NOTE — Progress Notes (Signed)
Patient c/o nausea and sweating. Patient very diaphoretic. Temp noted to be 100.3 degrees and had some nausea. Zofan given with relief. MD cancelled discharge order.

## 2018-08-30 NOTE — Discharge Summary (Signed)
[facility] - [place] at [facility]   PATIENT NAME: Destiny CharonLinda Mack    MR#:  [id number]  DATE OF BIRTH:  February 14, 1946  DATE OF ADMISSION:  08/28/2018 ADMITTING PHYSICIAN: Ihor AustinPavan Pyreddy, MD  DATE OF DISCHARGE:  08/30/18  PRIMARY CARE PHYSICIAN: Oswaldo ConroyBender, [name], MD    ADMISSION DIAGNOSIS:  AKI (acute kidney injury) (HCC) [N17.9] Urinary tract infection without hematuria, site unspecified [N39.0]  DISCHARGE DIAGNOSIS:   Acute kidney injury Urinary tract infection Chronically bedbound with chronic Foley catheter which needs to be changed twice a week, last changed on January 1 in the emergency department at [facility] SECONDARY DIAGNOSIS:   Past Medical History:  Diagnosis Date  . A-fib (HCC)   . Depression   . Frequent falls   . Hypertension   . Leaking of urine   . Mixed incontinence   . Stroke (HCC)   . Tremor of hands and face     HOSPITAL COURSE:   73 year old female patient with history of atrial fibrillation, urinary incontinence on Foley catheter, UTI in the past, hypertension, tobacco abuse currently under hospitalist service for mild suprapubic discomfort and weakness  -Acute kidney injury secondary to dehydration Clinically improving with IV fluids, continue the same Follow-up creatinine level 2.11-1.91-1.66, PCP to repeat BMP during the follow-up visit Avoid nephrotoxic drugs Encourage p.o. fluid intake  -Acute urinary tract infection IV Rocephin 1 g daily given during the hospital course and the leukocytosis completely resolved.  Urine culture has revealed greater than 100,000 colonies of gram-negative rods sensitivity pending which needs to be followed up by primary care physician during the follow-up visit.  Patient will be discharged with Keflex.  Previous urine culture 06/2018 has revealed E. coli which was sensitive to Keflex will discharge patient with Keflex IV fluids  -Urinary incontinence Foley catheter  has been changed in the emergency room  -Tobacco abuse Tobacco cessation counseled to the patient for 6 minutes Nicotine patch offered  -Chronic atrial fibrillation Continue Norvasc for rate control Resume Eliquis  5 mg twice a day   -Hyperlipidemia Continue statin medication  Chronically bedbound from previous history of stroke with chronic Foley catheter which needs to be changed twice a week, last changed on January 1 in [facility] emergency department [facility]  Resume home health PT, RN and aide  DISCHARGE CONDITIONS:   fair  CONSULTS OBTAINED:     PROCEDURES  None   DRUG ALLERGIES:  No Known Allergies  DISCHARGE MEDICATIONS:   Allergies as of 08/30/2018   No Known Allergies     Medication List    TAKE these medications   amLODipine 5 MG tablet Commonly known as:  NORVASC Take 5 mg by mouth daily.   apixaban 5 MG Tabs tablet Commonly known as:  ELIQUIS Take 1 tablet (5 mg total) by mouth 2 (two) times daily.   atorvastatin 20 MG tablet Commonly known as:  LIPITOR Take 20 mg by mouth at bedtime.   cephALEXin 500 MG capsule Commonly known as:  KEFLEX Take 1 capsule (500 mg total) by mouth 2 (two) times daily.   nicotine 21 mg/24hr patch Commonly known as:  NICODERM CQ - dosed in mg/24 hours Place 1 patch (21 mg total) onto the skin daily.   senna-docusate 8.6-50 MG tablet Commonly known as:  Senokot-S Take 1 tablet by mouth at bedtime as needed for mild constipation.        DISCHARGE INSTRUCTIONS:   Follow-up with primary care  physician in 4 to 5 days.  Primary care physician to repeat BMP during the follow-up visit and follow-up on the final urine culture results   DIET:  Cardiac diet  DISCHARGE CONDITION:  Fair  ACTIVITY:  Activity as tolerated, patient is bedbound  OXYGEN:  Home Oxygen: No.   Oxygen Delivery: room air  DISCHARGE LOCATION:  home   If you experience worsening of your admission symptoms,  develop shortness of breath, life threatening emergency, suicidal or homicidal thoughts you must seek medical attention immediately by calling 911 or calling your MD immediately  if symptoms less severe.  You Must read complete instructions/literature along with all the possible adverse reactions/side effects for all the Medicines you take and that have been prescribed to you. Take any new Medicines after you have completely understood and accpet all the possible adverse reactions/side effects.   Please note  You were cared for by a hospitalist during your hospital stay. If you have any questions about your discharge medications or the care you received while you were in the hospital after you are discharged, you can call the unit and asked to speak with the hospitalist on call if the hospitalist that took care of you is not available. Once you are discharged, your primary care physician will handle any further medical issues. Please note that NO REFILLS for any discharge medications will be authorized once you are discharged, as it is imperative that you return to your primary care physician (or establish a relationship with a primary care physician if you do not have one) for your aftercare needs so that they can reassess your need for medications and monitor your lab values.     Today  Chief Complaint  Patient presents with  . Abdominal Pain   Patient is feeling much better.  Agreeable to go home.  Denies any abdominal pain nausea or vomiting.  Feels good.  Discussed with patient's daughter, requesting patient transportation via EMS.  Discussed with social worker  ROS:  CONSTITUTIONAL: Denies fevers, chills. Denies any fatigue, weakness.  EYES: Denies blurry vision, double vision, eye pain. EARS, NOSE, THROAT: Denies tinnitus, ear pain, hearing loss. RESPIRATORY: Denies cough, wheeze, shortness of breath.  CARDIOVASCULAR: Denies chest pain, palpitations, edema.  GASTROINTESTINAL: Denies  nausea, vomiting, diarrhea, abdominal pain. Denies bright red blood per rectum. GENITOURINARY: Denies dysuria, hematuria.  Chronic Foley catheter ENDOCRINE: Denies nocturia or thyroid problems. HEMATOLOGIC AND LYMPHATIC: Denies easy bruising or bleeding. SKIN: Denies rash or lesion. MUSCULOSKELETAL: Denies pain in neck, back, shoulder, knees, hips or arthritic symptoms.  NEUROLOGIC: Bedbound PSYCHIATRIC: Denies anxiety or depressive symptoms.   VITAL SIGNS:  Blood pressure (!) 140/57, pulse (!) 106, temperature 98.4 F (36.9 C), temperature source Oral, resp. rate 18, height 5\' 4"  (1.626 m), weight 58.1 kg, SpO2 97 %.  I/O:    Intake/Output Summary (Last 24 hours) at 08/30/2018 1131 Last data filed at 08/30/2018 1005 Gross per 24 hour  Intake 1598.27 ml  Output 1425 ml  Net 173.27 ml    PHYSICAL EXAMINATION:  GENERAL:  73 y.o.-year-old patient lying in the bed with no acute distress.  EYES: Pupils equal, round, reactive to light and accommodation. No scleral icterus. Extraocular muscles intact.  HEENT: Head atraumatic, normocephalic. Oropharynx and nasopharynx clear.  NECK:  Supple, no jugular venous distention. No thyroid enlargement, no tenderness.  LUNGS: Normal breath sounds bilaterally, no wheezing, rales,rhonchi or crepitation. No use of accessory muscles of respiration.  CARDIOVASCULAR: S1, S2 normal. No murmurs, rubs, or  gallops.  ABDOMEN: Soft, non-tender, non-distended. Bowel sounds present.  EXTREMITIES: No pedal edema, cyanosis, or clubbing.  NEUROLOGIC: Chronically bedbound sensation intact. Gait not checked.  PSYCHIATRIC: The patient is alert and oriented x 3.  SKIN: No obvious rash, lesion, or ulcer.   DATA REVIEW:   CBC Recent Labs  Lab 08/30/18 1042  WBC 7.0  HGB 9.4*  HCT 29.9*  PLT 157    Chemistries  Recent Labs  Lab 08/28/18 0657  08/30/18 1042  NA 138   < > 140  K 4.5   < > 3.5  CL 116*   < > 122*  CO2 12*   < > 13*  GLUCOSE 124*   < > 139*   BUN 52*   < > 24*  CREATININE 2.11*   < > 1.15*  CALCIUM 9.3   < > 8.2*  AST 14*  --   --   ALT 10  --   --   ALKPHOS 75  --   --   BILITOT 0.6  --   --    < > = values in this interval not displayed.    Cardiac Enzymes No results for input(s): TROPONINI in the last 168 hours.  Microbiology Results  Results for orders placed or performed during the hospital encounter of 08/28/18  Urine culture     Status: Abnormal (Preliminary result)   Collection Time: 08/28/18  8:33 AM  Result Value Ref Range Status   Specimen Description   Final    URINE, CATHETERIZED Performed at [facility], [address], [place], [place] [address]    Special Requests   Final    NONE Performed at [facility], [address] [place], [place] [address]    Culture (A)  Final    >=100,000 COLONIES/mL ESCHERICHIA COLI 50,000 COLONIES/mL PSEUDOMONAS AERUGINOSA    Report Status PENDING  Incomplete    RADIOLOGY:  No results found.  EKG:   Orders placed or performed during the hospital encounter of 08/28/18  . EKG 12-Lead  . EKG 12-Lead      Management plans discussed with the patient, daughter via phone and they are in agreement.  CODE STATUS:     Code Status Orders  (From admission, onward)         Start     Ordered   08/28/18 1308  Full code  Continuous     08/28/18 1307        Code Status History    Date Active Date Inactive Code Status Order ID Comments User Context   07/06/2018 1628 07/09/2018 2237 Full Code [id number]  Altamese Dilling, MD ED   06/24/2018 1648 07/03/2018 1942 Full Code [id number]  Adrian Saran, MD Inpatient   05/20/2018 1847 05/21/2018 2212 Full Code [id number]  Ramonita Lab, MD Inpatient   05/20/2018 1847 05/20/2018 1847 Full Code [id number]  Ramonita Lab, MD Inpatient   12/31/2017 1538 01/04/2018 2130 DNR [id number]  Milagros Loll, MD ED   12/13/2017 1630 12/15/2017 2016 Full Code [id number]  Alford Highland, MD ED   11/22/2017 0202 11/23/2017  2009 DNR [id number]  Salary, Evelena Asa, MD ED   11/22/2017 0150 11/22/2017 0202 Full Code [id number]  Salary, Evelena Asa, MD ED   11/01/2017 1026 11/02/2017 0155 DNR [id number]  Delfino Lovett, MD Inpatient   10/29/2017 1256 11/01/2017 1026 Full Code [id number]  Alford Highland, MD ED      TOTAL TIME TAKING CARE OF THIS PATIENT: 43  minutes.   Note: This  dictation was prepared with Dragon dictation along with smaller phrase technology. Any transcriptional errors that result from this process are unintentional.   @[facility] @  on 08/30/2018 at 11:31 AM  Between 7am to 6pm - Pager - [phone]  After 6pm go to [email or web address] - password [email or web address] [facility]  [place] [facility]  Office  [phone]  CC: Primary care physician; Oswaldo Conroy, MD

## 2018-08-30 NOTE — Discharge Instructions (Signed)
Follow-up with primary care physician in 4 to 5 days.  Primary care physician to repeat BMP during the follow-up visit and follow-up on the final urine culture results

## 2018-08-30 NOTE — Care Management Note (Signed)
Case Management Note  Patient Details  Name: Destiny BeringLinda M Mack MRN: [id number] Date of Birth: 03-06-1946   Resumption home health orders have been placed.  [place] [facility] notified of [facility].  EMS transport packet on chart.   Subjective/Objective:                    Action/Plan:   Expected Discharge Date:  08/30/18               Expected Discharge Plan:  Home w Home Health Services  In-House Referral:     Discharge planning Services  CM Consult  Post Acute Care Choice:  Resumption of Svcs/PTA Provider Choice offered to:     DME Arranged:    DME Agency:     HH Arranged:  RN, PT, OT, Nurse's Aide, Social Work [employer] Agency:  [facility]  Status of Service:  Completed, signed off  If discussed at [employer] Meetings, dates discussed:    Additional Comments:  Chapman FitchBOWEN, Jeremiah Curci T, RN 08/30/2018, 2:53 PM

## 2018-08-30 NOTE — Care Management Important Message (Signed)
Copy of signed Medicare IM left with patient in room. 

## 2018-08-30 NOTE — Plan of Care (Signed)
  Problem: Education: Goal: Knowledge of General Education information will improve Description Including pain rating scale, medication(s)/side effects and non-pharmacologic comfort measures Outcome: Progressing   Problem: Clinical Measurements: Goal: Ability to maintain clinical measurements within normal limits will improve Outcome: Progressing Goal: Will remain free from infection Outcome: Progressing Goal: Diagnostic test results will improve Outcome: Progressing Goal: Respiratory complications will improve Outcome: Progressing Goal: Cardiovascular complication will be avoided Outcome: Progressing   Problem: Activity: Goal: Risk for activity intolerance will decrease Outcome: Progressing   Problem: Elimination: Goal: Will not experience complications related to bowel motility Outcome: Progressing Goal: Will not experience complications related to urinary retention Outcome: Progressing   Problem: Skin Integrity: Goal: Risk for impaired skin integrity will decrease Outcome: Progressing Patient turned q2

## 2018-08-31 LAB — URINE CULTURE: Culture: >=100000 — AB

## 2018-08-31 MED ORDER — CIPROFLOXACIN HCL 500 MG PO TABS
500.0000 mg | ORAL_TABLET | Freq: Two times a day (BID) | ORAL | Status: DC
  Administered 2018-08-31 (×2): 500 mg via ORAL
  Filled 2018-08-31 (×2): qty 1

## 2018-08-31 MED ORDER — CEPHALEXIN 500 MG PO CAPS: 500.0000 mg | ORAL_CAPSULE | Freq: Two times a day (BID) | ORAL | 0 refills | Status: DC

## 2018-08-31 MED ORDER — CIPROFLOXACIN HCL 500 MG PO TABS: 500.0000 mg | ORAL_TABLET | Freq: Two times a day (BID) | ORAL | 0 refills | Status: DC

## 2018-08-31 MED ORDER — CEPHALEXIN 500 MG PO CAPS: 500.0000 mg | ORAL_CAPSULE | Freq: Two times a day (BID) | ORAL | Status: DC

## 2018-08-31 NOTE — Progress Notes (Signed)
Patient will be discharged home today and [facility] will be providing transportation. This RN has called the patient's daughter to let her know that EMS has been contacted for transport and that this RN will call her to let her know when EMS has left to bring [name] home.

## 2018-08-31 NOTE — Discharge Summary (Signed)
[facility] [place] [facility]   PATIENT NAME: Destiny CharonLinda Mack    MR#:  [id number]  DATE OF BIRTH:  06/06/1946  DATE OF ADMISSION:  08/28/2018 ADMITTING PHYSICIAN: Ihor AustinPavan Pyreddy, MD  DATE OF DISCHARGE: August 31, 2018  PRIMARY CARE PHYSICIAN: Oswaldo ConroyBender, [name], MD    ADMISSION DIAGNOSIS:  AKI (acute kidney injury) (HCC) [N17.9] Urinary tract infection without hematuria, site unspecified [N39.0]  DISCHARGE DIAGNOSIS:  Active Problems:   Renal failure   SECONDARY DIAGNOSIS:   Past Medical History:  Diagnosis Date  . A-fib (HCC)   . Depression   . Frequent falls   . Hypertension   . Leaking of urine   . Mixed incontinence   . Stroke (HCC)   . Tremor of hands and face     HOSPITAL COURSE:   73 year old female with a history of PAF, urinary incontinence with chronic Foley catheter who presented to the emergency room with generalized weakness and suprapubic pain.  1.  Acute kidney injury in the setting of dehydration: Creatinine has improved.  2.  Pseudomonas and E. coli urinary tract infection due to chronic Foley catheter: Patient will be discharged on Keflex and ciprofloxacin as per sensitivities. Foley catheter was exchanged in the emergency room.  3.  PAF: Patient will continue on Eliquis and Norvasc  4.  Hyperlipidemia: Continue statin DISCHARGE CONDITIONS AND DIET:   Stable for discharge on regular diet  CONSULTS OBTAINED:    DRUG ALLERGIES:  No Known Allergies  DISCHARGE MEDICATIONS:   Allergies as of 08/31/2018   No Known Allergies     Medication List    TAKE these medications   amLODipine 5 MG tablet Commonly known as:  NORVASC Take 5 mg by mouth daily.   apixaban 5 MG Tabs tablet Commonly known as:  ELIQUIS Take 1 tablet (5 mg total) by mouth 2 (two) times daily.   atorvastatin 20 MG tablet Commonly known as:  LIPITOR Take 20 mg by mouth at bedtime.   cephALEXin 500 MG capsule Commonly known as:  KEFLEX Take 1  capsule (500 mg total) by mouth 2 (two) times daily.   ciprofloxacin 500 MG tablet Commonly known as:  CIPRO Take 1 tablet (500 mg total) by mouth 2 (two) times daily for 5 days.   nicotine 21 mg/24hr patch Commonly known as:  NICODERM CQ - dosed in mg/24 hours Place 1 patch (21 mg total) onto the skin daily.   senna-docusate 8.6-50 MG tablet Commonly known as:  Senokot-S Take 1 tablet by mouth at bedtime as needed for mild constipation.            Durable Medical Equipment  (From admission, onward)         Start     Ordered   08/31/18 1020  For home use only DME Walker rolling  Once    Question:  Patient needs a walker to treat with the following condition  Answer:  Weakness   08/31/18 1019            Today   CHIEF COMPLAINT:  No acute issues overnight   VITAL SIGNS:  Blood pressure 135/68, pulse 90, temperature 98.6 F (37 C), temperature source Oral, resp. rate 19, height 5\' 4"  (1.626 m), weight 58.1 kg, SpO2 95 %.   REVIEW OF SYSTEMS:  Review of Systems  Constitutional: Positive for malaise/fatigue. Negative for chills and fever.  HENT: Negative.  Negative for ear discharge, ear pain, hearing loss, nosebleeds and sore throat.  Eyes: Negative.  Negative for blurred vision and pain.  Respiratory: Negative.  Negative for cough, hemoptysis, shortness of breath and wheezing.   Cardiovascular: Negative.  Negative for chest pain, palpitations and leg swelling.  Gastrointestinal: Negative.  Negative for abdominal pain, blood in stool, diarrhea, nausea and vomiting.  Genitourinary: Negative.  Negative for dysuria.  Musculoskeletal: Negative.  Negative for back pain.  Skin: Negative.   Neurological: Negative for dizziness, tremors, speech change, focal weakness, seizures and headaches.  Endo/Heme/Allergies: Negative.  Does not bruise/bleed easily.  Psychiatric/Behavioral: Negative.  Negative for depression, hallucinations and suicidal ideas.     PHYSICAL  EXAMINATION:  GENERAL:  73 y.o.-year-old patient lying in the bed with no acute distress.  NECK:  Supple, no jugular venous distention. No thyroid enlargement, no tenderness.  LUNGS: Normal breath sounds bilaterally, no wheezing, rales,rhonchi  No use of accessory muscles of respiration.  CARDIOVASCULAR: S1, S2 normal. No murmurs, rubs, or gallops.  ABDOMEN: Soft, non-tender, non-distended. Bowel sounds present. No organomegaly or mass.  EXTREMITIES: No pedal edema, cyanosis, or clubbing.  PSYCHIATRIC: The patient is alert and oriented x 3.  SKIN: No obvious rash, lesion, or ulcer.   DATA REVIEW:   CBC Recent Labs  Lab 08/30/18 1042  WBC 7.0  HGB 9.4*  HCT 29.9*  PLT 157    Chemistries  Recent Labs  Lab 08/28/18 0657  08/30/18 1042  NA 138   < > 140  K 4.5   < > 3.5  CL 116*   < > 122*  CO2 12*   < > 13*  GLUCOSE 124*   < > 139*  BUN 52*   < > 24*  CREATININE 2.11*   < > 1.15*  CALCIUM 9.3   < > 8.2*  AST 14*  --   --   ALT 10  --   --   ALKPHOS 75  --   --   BILITOT 0.6  --   --    < > = values in this interval not displayed.    Cardiac Enzymes No results for input(s): TROPONINI in the last 168 hours.  Microbiology Results  @MICRORSLT48 @  RADIOLOGY:  No results found.    Allergies as of 08/31/2018   No Known Allergies     Medication List    TAKE these medications   amLODipine 5 MG tablet Commonly known as:  NORVASC Take 5 mg by mouth daily.   apixaban 5 MG Tabs tablet Commonly known as:  ELIQUIS Take 1 tablet (5 mg total) by mouth 2 (two) times daily.   atorvastatin 20 MG tablet Commonly known as:  LIPITOR Take 20 mg by mouth at bedtime.   cephALEXin 500 MG capsule Commonly known as:  KEFLEX Take 1 capsule (500 mg total) by mouth 2 (two) times daily.   ciprofloxacin 500 MG tablet Commonly known as:  CIPRO Take 1 tablet (500 mg total) by mouth 2 (two) times daily for 5 days.   nicotine 21 mg/24hr patch Commonly known as:  NICODERM CQ -  dosed in mg/24 hours Place 1 patch (21 mg total) onto the skin daily.   senna-docusate 8.6-50 MG tablet Commonly known as:  Senokot-S Take 1 tablet by mouth at bedtime as needed for mild constipation.            Durable Medical Equipment  (From admission, onward)         Start     Ordered   08/31/18 1020  For home use only  DME Walker rolling  Once    Question:  Patient needs a walker to treat with the following condition  Answer:  Weakness   08/31/18 1019            Management plans discussed with the patient and daughter and they are in agreement. [facility]  Patient should follow up with pcp  CODE STATUS:     Code Status Orders  (From admission, onward)         Start     Ordered   08/28/18 1308  Full code  Continuous     08/28/18 1307        Code Status History    Date Active Date Inactive Code Status Order ID Comments User Context   07/06/2018 1628 07/09/2018 2237 Full Code [id number]  Altamese DillingVachhani, Vaibhavkumar, MD ED   06/24/2018 1648 07/03/2018 1942 Full Code [id number]  Adrian SaranMody, Reyes Aldaco, MD Inpatient   05/20/2018 1847 05/21/2018 2212 Full Code [id number]  Ramonita LabGouru, Aruna, MD Inpatient   05/20/2018 1847 05/20/2018 1847 Full Code [id number]  Ramonita LabGouru, Aruna, MD Inpatient   12/31/2017 1538 01/04/2018 2130 DNR [id number]  Milagros LollSudini, Srikar, MD ED   12/13/2017 1630 12/15/2017 2016 Full Code [id number]  Alford HighlandWieting, Richard, MD ED   11/22/2017 0202 11/23/2017 2009 DNR [id number]  Salary, Evelena AsaMontell D, MD ED   11/22/2017 0150 11/22/2017 0202 Full Code [id number]  Salary, Evelena AsaMontell D, MD ED   11/01/2017 1026 11/02/2017 0155 DNR [id number]  Delfino LovettShah, Vipul, MD Inpatient   10/29/2017 1256 11/01/2017 1026 Full Code [id number]  Alford HighlandWieting, Richard, MD ED      TOTAL TIME TAKING CARE OF THIS PATIENT: 38 minutes.    Note: This dictation was prepared with Dragon dictation along with smaller phrase technology. Any transcriptional errors that result from this process are unintentional.  Shenita Trego M.D  on 08/31/2018 at 12:24 PM  Between 7am to 6pm - Pager - [phone] After 6pm go to [email or web address] - Social research officer, governmentpassword [email or web address]  [facility]  Office  [phone]  CC: Primary care physician; Oswaldo ConroyBender, [name], MD

## 2018-08-31 NOTE — Progress Notes (Addendum)
Patient had an episode of vomiting after being positioned almost flat on her back in order to pull her up for dinner. Emesis contained grapes that were consumed at lunch. She denies having any abdominal pain. IV Zofran was administered, VS were obtained and were within normal limits. Last bowel movement was today during this current shift. Dr. Juliene Pina was paged and notified of this and would like to proceed with discharge.

## 2018-08-31 NOTE — Progress Notes (Signed)
Patient discharged home with EMS.  Daughter given discharge instructions and I also went over instructions with patient as well.  IV removed and skin and catheter are intact.  Destiny Mack  08/31/2018  8:37 PM

## 2018-09-04 ENCOUNTER — Emergency Department
Admission: EM | Admit: 2018-09-04 | Discharge: 2018-09-04 | Disposition: A | Discharge status: Home / Self Care | Payer: Medicare HMO | Source: Home / Self Care | Attending: Emergency Medicine | Admitting: Emergency Medicine

## 2018-09-04 ENCOUNTER — Emergency Department: Payer: Medicare HMO

## 2018-09-04 ENCOUNTER — Encounter: Payer: Self-pay | Admitting: Emergency Medicine

## 2018-09-04 ENCOUNTER — Other Ambulatory Visit: Payer: Self-pay

## 2018-09-04 DIAGNOSIS — Z79899 Other long term (current) drug therapy: Secondary | ICD-10-CM

## 2018-09-04 DIAGNOSIS — I10 Essential (primary) hypertension: Secondary | ICD-10-CM | POA: Insufficient documentation

## 2018-09-04 DIAGNOSIS — Z8673 Personal history of transient ischemic attack (TIA), and cerebral infarction without residual deficits: Secondary | ICD-10-CM | POA: Insufficient documentation

## 2018-09-04 DIAGNOSIS — R410 Disorientation, unspecified: Secondary | ICD-10-CM | POA: Insufficient documentation

## 2018-09-04 DIAGNOSIS — G4089 Other seizures: Secondary | ICD-10-CM | POA: Diagnosis not present

## 2018-09-04 DIAGNOSIS — R4182 Altered mental status, unspecified: Secondary | ICD-10-CM | POA: Diagnosis not present

## 2018-09-04 DIAGNOSIS — Z7901 Long term (current) use of anticoagulants: Secondary | ICD-10-CM | POA: Insufficient documentation

## 2018-09-04 LAB — CBC WITH DIFFERENTIAL/PLATELET
Abs Immature Granulocytes: 0.03 10*3/uL (ref 0.00–0.07)
Basophils Absolute: 0 10*3/uL (ref 0.0–0.1)
Basophils Relative: 1 %
Eosinophils Absolute: 0.2 10*3/uL (ref 0.0–0.5)
Eosinophils Relative: 2 %
HEMATOCRIT: 35.5 % — AB (ref 36.0–46.0)
Hemoglobin: 11.3 g/dL — ABNORMAL LOW (ref 12.0–15.0)
Immature Granulocytes: 1 %
LYMPHS ABS: 1.4 10*3/uL (ref 0.7–4.0)
Lymphocytes Relative: 23 %
MCH: 30.3 pg (ref 26.0–34.0)
MCHC: 31.8 g/dL (ref 30.0–36.0)
MCV: 95.2 fL (ref 80.0–100.0)
Monocytes Absolute: 0.4 10*3/uL (ref 0.1–1.0)
Monocytes Relative: 6 %
Neutro Abs: 4.2 10*3/uL (ref 1.7–7.7)
Neutrophils Relative %: 67 %
Platelets: 190 10*3/uL (ref 150–400)
RBC: 3.73 MIL/uL — ABNORMAL LOW (ref 3.87–5.11)
RDW: 16.1 % — ABNORMAL HIGH (ref 11.5–15.5)
WBC: 6.2 10*3/uL (ref 4.0–10.5)
nRBC: 0 % (ref 0.0–0.2)

## 2018-09-04 LAB — URINALYSIS, COMPLETE (UACMP) WITH MICROSCOPIC
Bacteria, UA: NONE SEEN
Bilirubin Urine: NEGATIVE
Glucose, UA: NEGATIVE mg/dL
KETONES UR: NEGATIVE mg/dL
Nitrite: NEGATIVE
PH: 5 (ref 5.0–8.0)
Protein, ur: 30 mg/dL — AB
RBC / HPF: >50 RBC/hpf — ABNORMAL HIGH (ref 0–5)
Specific Gravity, Urine: 1.026 (ref 1.005–1.030)

## 2018-09-04 LAB — BASIC METABOLIC PANEL
ANION GAP: 8 (ref 5–15)
BUN: 11 mg/dL (ref 8–23)
CO2: 18 mmol/L — ABNORMAL LOW (ref 22–32)
Calcium: 8.3 mg/dL — ABNORMAL LOW (ref 8.9–10.3)
Chloride: 116 mmol/L — ABNORMAL HIGH (ref 98–111)
Creatinine, Ser: 1.39 mg/dL — ABNORMAL HIGH (ref 0.44–1.00)
GFR calc Af Amer: 43 mL/min — ABNORMAL LOW (ref >60)
GFR calc non Af Amer: 38 mL/min — ABNORMAL LOW (ref >60)
Glucose, Bld: 92 mg/dL (ref 70–99)
Potassium: 3.6 mmol/L (ref 3.5–5.1)
Sodium: 142 mmol/L (ref 135–145)

## 2018-09-04 LAB — BRAIN NATRIURETIC PEPTIDE: B Natriuretic Peptide: 294 pg/mL — ABNORMAL HIGH (ref 0.0–100.0)

## 2018-09-04 LAB — TROPONIN I: Troponin I: <0.03 ng/mL (ref <0.03)

## 2018-09-04 MED ORDER — FLUCONAZOLE 50 MG PO TABS
150.0000 mg | ORAL_TABLET | Freq: Once | ORAL | Status: AC
  Administered 2018-09-04: 150 mg via ORAL
  Filled 2018-09-04: qty 1

## 2018-09-04 MED ORDER — SODIUM CHLORIDE 0.9 % IV BOLUS
500.0000 mL | Freq: Once | INTRAVENOUS | Status: AC
  Administered 2018-09-04: 500 mL via INTRAVENOUS

## 2018-09-04 MED ORDER — SODIUM CHLORIDE 0.9 % IV SOLN
1.0000 g | Freq: Once | INTRAVENOUS | Status: AC
  Administered 2018-09-04: 1 g via INTRAVENOUS
  Filled 2018-09-04: qty 10

## 2018-09-04 NOTE — ED Notes (Signed)
Daughter notified of patient discharge. Patient given graham crackers to eat.

## 2018-09-04 NOTE — ED Provider Notes (Addendum)
[facility] Emergency Department Provider Note  ____________________________________________   I have reviewed the triage vital signs and the nursing notes. Where available I have reviewed prior notes and, if possible and indicated, outside hospital notes.    HISTORY  Chief Complaint Altered Mental Status    HPI Destiny Mack is a 73 y.o. female  Whose medical problems including recent pneumonia and on antibiotics for a UTI associated with her indwelling Foley presents today from family.  They were apparently concerned that she was not acting right briefly.  They are not here, her particular complaint is that she feels "great" and has no complaints.  Patient does have a history of frequent falls but has not fallen.  She denies any symptoms of any variety except for a mild ongoing cough.  She denies chest pain shortness of breath nausea vomiting dysuria urinary frequency headache stiff neck diarrhea vomiting or abdominal pain.  Unclear exactly what her baseline mental status is.  Again no family are here Level 5 chart caveat; no further history available due to patient status.  Past Medical History:  Diagnosis Date  . A-fib (HCC)   . Depression   . Frequent falls   . Hypertension   . Leaking of urine   . Mixed incontinence   . Stroke (HCC)   . Tremor of hands and face     Patient Active Problem List   Diagnosis Date Noted  . Renal failure 08/28/2018  . Syncope 07/06/2018  . Acute lower UTI 07/06/2018  . UTI (urinary tract infection) 07/06/2018  . Sepsis (HCC) 06/24/2018  . CVA (cerebral vascular accident) (HCC) 05/20/2018  . Malnutrition of moderate degree 01/01/2018  . Pneumonia 12/31/2017  . Hypotension 12/13/2017  . Diarrhea 11/22/2017  . Campylobacter diarrhea 11/22/2017  . Acute encephalopathy 10/29/2017  . Pressure injury of skin 10/29/2017    Past Surgical History:  Procedure Laterality Date  . APPENDECTOMY    . TEE WITHOUT CARDIOVERSION  N/A 01/04/2018   Procedure: TRANSESOPHAGEAL ECHOCARDIOGRAM (TEE);  Surgeon: Lamar BlinksKowalski, Bruce J, MD;  Location: [facility] ORS;  Service: Cardiovascular;  Laterality: N/A;  . TUBAL LIGATION      Prior to Admission medications   Medication Sig Start Date End Date Taking? Authorizing Provider  amLODipine (NORVASC) 5 MG tablet Take 5 mg by mouth daily.    [provider]  apixaban (ELIQUIS) 5 MG TABS tablet Take 1 tablet (5 mg total) by mouth 2 (two) times daily. 12/15/17 08/28/18  Ihor AustinPyreddy, Pavan, MD  atorvastatin (LIPITOR) 20 MG tablet Take 20 mg by mouth at bedtime.     [provider]  cephALEXin (KEFLEX) 500 MG capsule Take 1 capsule (500 mg total) by mouth 2 (two) times daily. 08/30/18   Adrian SaranMody, Sital, MD  ciprofloxacin (CIPRO) 500 MG tablet Take 1 tablet (500 mg total) by mouth 2 (two) times daily for 5 days. 08/31/18 09/05/18  Adrian SaranMody, Sital, MD  nicotine (NICODERM CQ - DOSED IN MG/24 HOURS) 21 mg/24hr patch Place 1 patch (21 mg total) onto the skin daily. 08/30/18   Gouru, Deanna ArtisAruna, MD  senna-docusate (SENOKOT-S) 8.6-50 MG tablet Take 1 tablet by mouth at bedtime as needed for mild constipation. 08/30/18   Ramonita LabGouru, Aruna, MD    Allergies Patient has no known allergies.  Family History  Problem Relation Age of Onset  . Dementia Mother   . Heart failure Mother   . Heart failure Father   . CAD Father     Social History Social History  Tobacco Use  . Smoking status: Current Every Day Smoker    Packs/day: 2.00    Years: 56.00    Pack years: 112.00    Last attempt to quit: 11/22/2017    Years since quitting: 0.7  . Smokeless tobacco: Never Used  Substance Use Topics  . Alcohol use: No    Frequency: Never  . Drug use: No    Review of Systems Constitutional: No fever/chills Eyes: No visual changes. ENT: No sore throat. No stiff neck no neck pain Cardiovascular: Denies chest pain. Respiratory: Denies shortness of breath. Gastrointestinal:   no vomiting.  No diarrhea.  No  constipation. Genitourinary: Negative for dysuria. Musculoskeletal: Negative lower extremity swelling Skin: Negative for rash. Neurological: Negative for severe headaches, focal weakness or numbness.   ____________________________________________   PHYSICAL EXAM:  VITAL SIGNS: ED Triage Vitals  Enc Vitals Group     BP 09/04/18 1514 (!) 103/54     Pulse Rate 09/04/18 1514 (!) 57     Resp --      Temp 09/04/18 1514 98.2 F (36.8 C)     Temp Source 09/04/18 1514 Oral     SpO2 09/04/18 1512 98 %     Weight 09/04/18 1518 128 lb (58.1 kg)     Height 09/04/18 1518 5\' 4"  (1.626 m)     Head Circumference --      Peak Flow --      Pain Score 09/04/18 1518 0     Pain Loc --      Pain Edu? --      Excl. in GC? --     Constitutional: Alert and oriented to name and place, unsure of the date. Well appearing and in no acute distress. Eyes: Conjunctivae are normal Head: Atraumatic HEENT: No congestion/rhinnorhea. Mucous membranes are moist.  Oropharynx non-erythematous Neck:   Nontender with no meningismus, no masses, no stridor Cardiovascular: Normal rate, regular rhythm. Grossly normal heart sounds.  Good peripheral circulation. Respiratory: Normal respiratory effort.  No retractions. Lungs rhonchi on the right, large airway, mostly clears with cough. Abdominal: Soft and nontender. No distention. No guarding no rebound Back:  There is no focal tenderness or step off.  there is no midline tenderness there are no lesions noted. there is no CVA tenderness Musculoskeletal: No lower extremity tenderness, no upper extremity tenderness. No joint effusions, no DVT signs strong distal pulses no edema Neurologic:  Normal speech and language. No gross focal neurologic deficits are appreciated.  Skin:  Skin is warm, dry and intact. No rash noted. Psychiatric: Mood and affect are normal. Speech and behavior are normal.  ____________________________________________   LABS (all labs ordered are  listed, but only abnormal results are displayed)  Labs Reviewed  CBC WITH DIFFERENTIAL/PLATELET - Abnormal; Notable for the following components:      Result Value   RBC 3.73 (*)    Hemoglobin 11.3 (*)    HCT 35.5 (*)    RDW 16.1 (*)    All other components within normal limits  URINE CULTURE  URINALYSIS, COMPLETE (UACMP) WITH MICROSCOPIC  BASIC METABOLIC PANEL  BRAIN NATRIURETIC PEPTIDE  TROPONIN I    Pertinent labs  results that were available during my care of the patient were reviewed by me and considered in my medical decision making (see chart for details). ____________________________________________  EKG  I personally interpreted any EKGs ordered by me or triage  ____________________________________________  RADIOLOGY  Pertinent labs & imaging results that were available during my care of  the patient were reviewed by me and considered in my medical decision making (see chart for details). If possible, patient and/or family made aware of any abnormal findings.  No results found. ____________________________________________    PROCEDURES  Procedure(s) performed: None  Procedures  Critical Care performed: None  ____________________________________________   INITIAL IMPRESSION / ASSESSMENT AND PLAN / ED COURSE  Pertinent labs & imaging results that were available during my care of the patient were reviewed by me and considered in my medical decision making (see chart for details).  Patient here with some alleged early confusion, she is 73 year old, she is bright and spritely at this moment has no complaints.  She is getting over a lung infection, she also is getting over a UTI, we will check basic blood work including renal function as she recently had AKI, we will check chest x-ray we will check basic blood work and urinalysis and reassess  ----------------------------------------- 6:38 PM on 09/04/2018 ----------------------------------------- Patient is  in no acute distress awake and alert family states that she just had decreased p.o. today and they were the urinary tract infection was getting worse.  No seizure activity no fever no vomiting etc.  No focal numbness or weakness.  Patient continues to look at what the family describes as her baseline, we will ensure that she gets some IV fluid from [place] is here kidney function is creeping back up, and we will resend cultures on her urine.  She is taking already medication to cover her for her Pseudomonas and E. coli, Keflex and Cipro, which should be good.  However, there are still leukocytes and white cells in her Foley catheterized urine.  Unfortunately is difficult to know the degree to which this represents some form of colonization or active infection.  There is no evidence at this time of systemic infection.  I think if we give the patient fluids, and she is tolerating p.o. well, she likely can go home.  I will give her a dose of Rocephin here pending repeat culture and we will change her Foley.  Patient's family are very comfortable with this plan as is the patient and she is eager to go home.  ----------------------------------------- 8:14 PM on 09/04/2018 -----------------------------------------  10 no acute distress, she feels "fine".  We have changed the Foley given her antibiotics and we will wait to see what the culture results show, we will discharge her no evidence of urosepsis or other significant infection or other pathology today and she appears to be at her baseline to the extent that we can determine.  Family and she would much prefer that she go home so we will discharge her with return precautions follow-up given understood    ____________________________________________   FINAL CLINICAL IMPRESSION(S) / ED DIAGNOSES  Final diagnoses:  None      This chart was dictated using voice recognition software.  Despite best efforts to proofread,  errors can occur which can change  meaning.      Jeanmarie Plant, MD 09/04/18 1628    Jeanmarie Plant, MD 09/04/18 1839    Jeanmarie Plant, MD 09/04/18 2014

## 2018-09-04 NOTE — Discharge Instructions (Addendum)
Take the antibiotics until they are gone return to the emergency room for any new or worrisome symptoms including fever, confusion, vomiting, or if you feel worse in any way.  Follow closely with your doctor tomorrow without fail.

## 2018-09-04 NOTE — ED Notes (Signed)
Called [facility] for transport to [address] [place]

## 2018-09-04 NOTE — ED Notes (Signed)
Patient out of ED via [facility] at this time. Daughter aware of patient discharge.

## 2018-09-04 NOTE — ED Notes (Signed)
Patient transported to X-ray 

## 2018-09-04 NOTE — ED Triage Notes (Addendum)
Pt ems from home. Per ems family was concerned that pt was confused and staring off into space. Pt was D/C from here sat for UTI and may still be on antibiotics. Pt has indwelling foley cath. Pt was also recently treated for pneumonia.

## 2018-09-05 ENCOUNTER — Other Ambulatory Visit: Payer: Self-pay

## 2018-09-05 ENCOUNTER — Inpatient Hospital Stay
Admission: EM | Admit: 2018-09-05 | Discharge: 2018-09-09 | DRG: 101 | Disposition: A | Discharge status: Home Health | Payer: Medicare HMO | Attending: Specialist | Admitting: Specialist

## 2018-09-05 DIAGNOSIS — I129 Hypertensive chronic kidney disease with stage 1 through stage 4 chronic kidney disease, or unspecified chronic kidney disease: Secondary | ICD-10-CM | POA: Diagnosis present

## 2018-09-05 DIAGNOSIS — R29709 NIHSS score 9: Secondary | ICD-10-CM | POA: Diagnosis present

## 2018-09-05 DIAGNOSIS — Z8249 Family history of ischemic heart disease and other diseases of the circulatory system: Secondary | ICD-10-CM

## 2018-09-05 DIAGNOSIS — G4089 Other seizures: Principal | ICD-10-CM | POA: Diagnosis present

## 2018-09-05 DIAGNOSIS — F17210 Nicotine dependence, cigarettes, uncomplicated: Secondary | ICD-10-CM | POA: Diagnosis present

## 2018-09-05 DIAGNOSIS — N183 Chronic kidney disease, stage 3 (moderate): Secondary | ICD-10-CM | POA: Diagnosis present

## 2018-09-05 DIAGNOSIS — Z7901 Long term (current) use of anticoagulants: Secondary | ICD-10-CM

## 2018-09-05 DIAGNOSIS — I482 Chronic atrial fibrillation, unspecified: Secondary | ICD-10-CM | POA: Diagnosis present

## 2018-09-05 DIAGNOSIS — Z79899 Other long term (current) drug therapy: Secondary | ICD-10-CM

## 2018-09-05 DIAGNOSIS — N3000 Acute cystitis without hematuria: Secondary | ICD-10-CM

## 2018-09-05 DIAGNOSIS — Z66 Do not resuscitate: Secondary | ICD-10-CM | POA: Diagnosis present

## 2018-09-05 DIAGNOSIS — R569 Unspecified convulsions: Secondary | ICD-10-CM

## 2018-09-05 DIAGNOSIS — Z8673 Personal history of transient ischemic attack (TIA), and cerebral infarction without residual deficits: Secondary | ICD-10-CM

## 2018-09-05 LAB — GLUCOSE, CAPILLARY: Glucose-Capillary: 77 mg/dL (ref 70–99)

## 2018-09-05 NOTE — ED Notes (Signed)
EKG completed at 11:43;  IV attempted x2 by RN.

## 2018-09-05 NOTE — ED Provider Notes (Addendum)
[facility] Emergency Department Provider Note  ____________________________________________   First MD Initiated Contact with Patient 09/05/18 2341     (approximate)  I have reviewed the triage vital signs and the nursing notes.   HISTORY  Chief Complaint Seizures    HPI Destiny Mack is a 73 y.o. female with below list of chronic medical conditions including A. fib CVA and acute encephalopathy presents to the emergency department witnessed seizure-like activity per EMS.  EMS states that family noted a witnessed episode of seizure-like activity approximately 30 minutes before arrival to the emergency department.  Patient states that she does not have any recollection of the incident.  She states that she recalls calling for her daughter and then subsequently losing consciousness.  Of note the patient had a witnessed seizure-like activity 1 week ago she was diagnosed with a urinary tract infection at that time.  Patient states she currently has left lower extremity weakness.  Past Medical History:  Diagnosis Date  . A-fib (HCC)   . Depression   . Frequent falls   . Hypertension   . Leaking of urine   . Mixed incontinence   . Stroke (HCC)   . Tremor of hands and face     Patient Active Problem List   Diagnosis Date Noted  . Renal failure 08/28/2018  . Syncope 07/06/2018  . Acute lower UTI 07/06/2018  . UTI (urinary tract infection) 07/06/2018  . Sepsis (HCC) 06/24/2018  . CVA (cerebral vascular accident) (HCC) 05/20/2018  . Malnutrition of moderate degree 01/01/2018  . Pneumonia 12/31/2017  . Hypotension 12/13/2017  . Diarrhea 11/22/2017  . Campylobacter diarrhea 11/22/2017  . Acute encephalopathy 10/29/2017  . Pressure injury of skin 10/29/2017    Past Surgical History:  Procedure Laterality Date  . APPENDECTOMY    . TEE WITHOUT CARDIOVERSION N/A 01/04/2018   Procedure: TRANSESOPHAGEAL ECHOCARDIOGRAM (TEE);  Surgeon: Lamar Blinks, MD;   Location: [facility] ORS;  Service: Cardiovascular;  Laterality: N/A;  . TUBAL LIGATION      Prior to Admission medications   Medication Sig Start Date End Date Taking? Authorizing Provider  amLODipine (NORVASC) 5 MG tablet Take 5 mg by mouth daily.    [provider]  apixaban (ELIQUIS) 5 MG TABS tablet Take 1 tablet (5 mg total) by mouth 2 (two) times daily. 12/15/17 08/28/18  Ihor Austin, MD  atorvastatin (LIPITOR) 20 MG tablet Take 20 mg by mouth at bedtime.     [provider]  cephALEXin (KEFLEX) 500 MG capsule Take 1 capsule (500 mg total) by mouth 2 (two) times daily. 08/30/18   Adrian Saran, MD  nicotine (NICODERM CQ - DOSED IN MG/24 HOURS) 21 mg/24hr patch Place 1 patch (21 mg total) onto the skin daily. 08/30/18   Gouru, Deanna Artis, MD  senna-docusate (SENOKOT-S) 8.6-50 MG tablet Take 1 tablet by mouth at bedtime as needed for mild constipation. 08/30/18   Ramonita Lab, MD    Allergies Patient has no known allergies.  Family History  Problem Relation Age of Onset  . Dementia Mother   . Heart failure Mother   . Heart failure Father   . CAD Father     Social History Social History   Tobacco Use  . Smoking status: Current Every Day Smoker    Packs/day: 2.00    Years: 56.00    Pack years: 112.00    Last attempt to quit: 11/22/2017    Years since quitting: 0.7  . Smokeless tobacco: Never Used  Substance Use Topics  . Alcohol use: No    Frequency: Never  . Drug use: No    Review of Systems Constitutional: No fever/chills Eyes: No visual changes. ENT: No sore throat. Cardiovascular: Denies chest pain. Respiratory: Denies shortness of breath. Gastrointestinal: No abdominal pain.  No nausea, no vomiting.  No diarrhea.  No constipation. Genitourinary: Negative for dysuria. Musculoskeletal: Negative for neck pain.  Negative for back pain. Integumentary: Negative for rash. Neurological: Negative for headaches, positive for left lower extremity  weakness. ____________________________________________   PHYSICAL EXAM:  VITAL SIGNS: ED Triage Vitals  Enc Vitals Group     BP      Pulse      Resp      Temp      Temp src      SpO2      Weight      Height      Head Circumference      Peak Flow      Pain Score      Pain Loc      Pain Edu?      Excl. in GC?     Constitutional: Alert and oriented.  Appears postictal fatigue however alert and oriented  eyes: Conjunctivae are normal. PERRL. EOMI. Mouth/Throat: Mucous membranes are moist.  Oropharynx non-erythematous. Neck: No stridor.   Cardiovascular: Normal rate, regular rhythm. Good peripheral circulation. Grossly normal heart sounds. Respiratory: Normal respiratory effort.  No retractions. Lungs CTAB. Gastrointestinal: Soft and nontender. No distention.  Musculoskeletal: No lower extremity tenderness nor edema. No gross deformities of extremities. Neurologic:  Normal speech and language. No gross focal neurologic deficits are appreciated.  Skin:  Skin is warm, dry and intact. No rash noted.   ____________________________________________   LABS (all labs ordered are listed, but only abnormal results are displayed)  Labs Reviewed  CBC - Abnormal; Notable for the following components:      Result Value   RBC 3.56 (*)    Hemoglobin 10.8 (*)    HCT 34.1 (*)    RDW 16.0 (*)    All other components within normal limits  COMPREHENSIVE METABOLIC PANEL - Abnormal; Notable for the following components:   Chloride 118 (*)    CO2 20 (*)    Creatinine, Ser 1.32 (*)    Calcium 8.2 (*)    Total Protein 6.2 (*)    Albumin 3.0 (*)    GFR calc non Af Amer 40 (*)    GFR calc Af Amer 46 (*)    All other components within normal limits  URINALYSIS, ROUTINE W REFLEX MICROSCOPIC - Abnormal; Notable for the following components:   Color, Urine YELLOW (*)    APPearance HAZY (*)    Hgb urine dipstick LARGE (*)    Protein, ur 30 (*)    Leukocytes, UA MODERATE (*)    RBC / HPF  >50 (*)    WBC, UA >50 (*)    All other components within normal limits  PROTIME-INR - Abnormal; Notable for the following components:   Prothrombin Time 20.7 (*)    All other components within normal limits  APTT - Abnormal; Notable for the following components:   aPTT 55 (*)    All other components within normal limits  URINE CULTURE  DIFFERENTIAL  TROPONIN I  GLUCOSE, CAPILLARY   ____________________________________________  EKG  ED ECG REPORT I, [place] N Vinson Tietze, the attending physician, personally viewed and interpreted this ECG.   Date: 09/06/2018  EKG Time:  Rate: 58  Rhythm: Sinus bradycardia  Axis: Normal  Intervals: Normal  ST&T Change: None  ____________________________________________  RADIOLOGY I,  N Bekki Tavenner, personally viewed and evaluated these images (plain radiographs) as part of my medical decision making, as well as reviewing the written report by the radiologist.  ED MD interpretation: CT angiogram head neck and CT perfusion scan revealed no evidence of ischemia or infarction.  Official radiology report(s): Ct Angio Head W Or Wo Contrast  Result Date: 09/06/2018 CLINICAL DATA:  73 y/o F; seizure-like activity. Focal neural deficit. Code stroke. EXAM: CT HEAD WITHOUT CONTRAST CT ANGIOGRAPHY HEAD AND NECK CT PERFUSION BRAIN TECHNIQUE: Multidetector CT imaging of the head and neck was performed using the standard protocol during bolus administration of intravenous contrast. Multiplanar CT image reconstructions and MIPs were obtained to evaluate the vascular anatomy. Carotid stenosis measurements (when applicable) are obtained utilizing NASCET criteria, using the distal internal carotid diameter as the denominator. Multiphase CT imaging of the brain was performed following IV bolus contrast injection. Subsequent parametric perfusion maps were calculated using RAPID software. CONTRAST:  ISOVUE-370 IOPAMIDOL (ISOVUE-370) INJECTION 76% COMPARISON:   07/19/2018 CT head. 07/09/2018 MRI head. FINDINGS: CT HEAD FINDINGS Brain: No evidence of acute infarction, hemorrhage, hydrocephalus, extra-axial collection or mass lesion/mass effect. 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 Stable chronic microvascular ischemic changes and volume loss of the brain. Vascular: As below. Skull: Normal. Negative for fracture or focal lesion. Sinuses/Orbits: Small fluid levels within the paranasal sinuses. Normal aeration of mastoid air cells. Orbits are unremarkable. Other: None. ASPECTS (Alberta Stroke Program Early CT Score) - Ganglionic level infarction (caudate, lentiform nuclei, internal capsule, insula, M1-M3 cortex): 7 - Supraganglionic infarction (M4-M6 cortex): 3 Total score (0-10 with 10 being normal): 10 Review of the MIP images confirms the above findings CTA NECK FINDINGS Aortic arch: Standard branching. Imaged portion shows no evidence of aneurysm or dissection. No significant stenosis of the major arch vessel origins. Moderate mixed plaque of the aortic arch. Right carotid system: No evidence of dissection, stenosis (50% or greater) or occlusion. Predominant fibrofatty plaque of the carotid bifurcation with minimal less than 30% proximal ICA stenosis. Left carotid system: No evidence of dissection, stenosis (50% or greater) or occlusion. Predominant fibrofatty plaque of the carotid bifurcation with mild less than 50% proximal ICA stenosis. Mild nonstenotic beaded irregularity of the left mid cervical ICA. Vertebral arteries: Codominant. No evidence of dissection, stenosis (50% or greater) or occlusion. Segments of non stenotic beaded irregularity of the vertebral arteries. Skeleton: Moderate severe spondylosis of the cervical spine. Multilevel uncovertebral and facet hypertrophy with bony foraminal encroachment. Spinal canal stenosis is greatest at the C6-7 level where it is mild-to-moderate. Grade 1  C7-T1 anterolisthesis. Other neck: Negative. Upper chest: Mild centrilobular emphysema of the lung apices. Review of the MIP images confirms the above findings CTA HEAD FINDINGS Anterior circulation: No significant stenosis, proximal occlusion, aneurysm, or vascular malformation. Non stenotic calcific atherosclerosis of carotid siphons. Posterior circulation: No significant stenosis, proximal occlusion, aneurysm, or vascular malformation. Mild right P2 stenosis. Venous sinuses: As permitted by contrast timing, patent. Anatomic variants: Complete circle-of-Willis. Right fetal PCA. Review of the MIP images confirms the above findings CT Brain Perfusion Findings: CBF (<30%) Volume: 70mL Perfusion (Tmax>6.0s) volume: 1mL Mismatch Volume: 24mL Infarction Location:Negative. IMPRESSION: CT head: 1. No acute intracranial abnormality identified.  ASPECTS is 10. 2. Stable chronic microvascular ischemic changes and volume loss of the brain. Stable small  chronic infarcts in left frontal, left parietal, and left occipital lobes. CT perfusion head: Negative CT perfusion head. No acute stroke or ischemic penumbra by RAPID perfusion criteria. CTA neck: 1. Patent carotid and vertebral arteries. No dissection, aneurysm, or high-grade stenosis by NASCET criteria. 2. Predominant fibrofatty plaque of bilateral carotid bifurcations with mild less than 50% proximal ICA stenosis. 3. 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 4. Moderate severe spondylosis of the cervical spine greatest at C6-7. CTA head: Patent anterior and posterior intracranial circulation. No large vessel occlusion, aneurysm, or significant stenosis is identified. These results were called by telephone at the time of interpretation on 09/06/2018 at 1:06 am to Dr. Bayard Males , who verbally acknowledged these results. Electronically Signed   By: Mitzi Hansen M.D.   On: 09/06/2018 01:09   Ct Angio Neck W  Or Wo Contrast  Result Date: 09/06/2018 CLINICAL DATA:  73 y/o F; seizure-like activity. Focal neural deficit. Code stroke. EXAM: CT HEAD WITHOUT CONTRAST CT ANGIOGRAPHY HEAD AND NECK CT PERFUSION BRAIN TECHNIQUE: Multidetector CT imaging of the head and neck was performed using the standard protocol during bolus administration of intravenous contrast. Multiplanar CT image reconstructions and MIPs were obtained to evaluate the vascular anatomy. Carotid stenosis measurements (when applicable) are obtained utilizing NASCET criteria, using the distal internal carotid diameter as the denominator. Multiphase CT imaging of the brain was performed following IV bolus contrast injection. Subsequent parametric perfusion maps were calculated using RAPID software. CONTRAST:  ISOVUE-370 IOPAMIDOL (ISOVUE-370) INJECTION 76% COMPARISON:  07/19/2018 CT head. 07/09/2018 MRI head. FINDINGS: CT HEAD FINDINGS Brain: No evidence of acute infarction, hemorrhage, hydrocephalus, extra-axial collection or mass lesion/mass effect. 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 Stable chronic microvascular ischemic changes and volume loss of the brain. Vascular: As below. Skull: Normal. Negative for fracture or focal lesion. Sinuses/Orbits: Small fluid levels within the paranasal sinuses. Normal aeration of mastoid air cells. Orbits are unremarkable. Other: None. ASPECTS (Alberta Stroke Program Early CT Score) - Ganglionic level infarction (caudate, lentiform nuclei, internal capsule, insula, M1-M3 cortex): 7 - Supraganglionic infarction (M4-M6 cortex): 3 Total score (0-10 with 10 being normal): 10 Review of the MIP images confirms the above findings CTA NECK FINDINGS Aortic arch: Standard branching. Imaged portion shows no evidence of aneurysm or dissection. No significant stenosis of the major arch vessel origins. Moderate mixed plaque of the aortic arch. Right carotid  system: No evidence of dissection, stenosis (50% or greater) or occlusion. Predominant fibrofatty plaque of the carotid bifurcation with minimal less than 30% proximal ICA stenosis. Left carotid system: No evidence of dissection, stenosis (50% or greater) or occlusion. Predominant fibrofatty plaque of the carotid bifurcation with mild less than 50% proximal ICA stenosis. Mild nonstenotic beaded irregularity of the left mid cervical ICA. Vertebral arteries: Codominant. No evidence of dissection, stenosis (50% or greater) or occlusion. Segments of non stenotic beaded irregularity of the vertebral arteries. Skeleton: Moderate severe spondylosis of the cervical spine. Multilevel uncovertebral and facet hypertrophy with bony foraminal encroachment. Spinal canal stenosis is greatest at the C6-7 level where it is mild-to-moderate. Grade 1 C7-T1 anterolisthesis. Other neck: Negative. Upper chest: Mild centrilobular emphysema of the lung apices. Review of the MIP images confirms the above findings CTA HEAD FINDINGS Anterior circulation: No significant stenosis, proximal occlusion, aneurysm, or vascular malformation. Non stenotic calcific atherosclerosis of carotid siphons. Posterior circulation: No significant stenosis, proximal occlusion, aneurysm, or  vascular malformation. Mild right P2 stenosis. Venous sinuses: As permitted by contrast timing, patent. Anatomic variants: Complete circle-of-Willis. Right fetal PCA. Review of the MIP images confirms the above findings CT Brain Perfusion Findings: CBF (<30%) Volume: 0mL Perfusion (Tmax>6.0s) volume: 0mL Mismatch Volume: 0mL Infarction Location:Negative. IMPRESSION: CT head: 1. No acute intracranial abnormality identified.  ASPECTS is 10. 2. Stable chronic microvascular ischemic changes and volume loss of the brain. 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 CT perfusion head: Negative CT perfusion head. No acute stroke or ischemic  penumbra by RAPID perfusion criteria. CTA neck: 1. Patent carotid and vertebral arteries. No dissection, aneurysm, or high-grade stenosis by NASCET criteria. 2. Predominant fibrofatty plaque of bilateral carotid bifurcations with mild less than 50% proximal ICA stenosis. 3. 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 4. Moderate severe spondylosis of the cervical spine greatest at C6-7. CTA head: Patent anterior and posterior intracranial circulation. No large vessel occlusion, aneurysm, or significant stenosis is identified. These results were called by telephone at the time of interpretation on 09/06/2018 at 1:06 am to Dr. Bayard MalesANDOLPH Belkis Norbeck , who verbally acknowledged these results. Electronically Signed   By: Mitzi HansenLance  Furusawa-Stratton M.D.   On: 09/06/2018 01:09   Ct Cerebral Perfusion W Contrast  Result Date: 09/06/2018 CLINICAL DATA:  73 y/o F; seizure-like activity. Focal neural deficit. Code stroke. EXAM: CT HEAD WITHOUT CONTRAST CT ANGIOGRAPHY HEAD AND NECK CT PERFUSION BRAIN TECHNIQUE: Multidetector CT imaging of the head and neck was performed using the standard protocol during bolus administration of intravenous contrast. Multiplanar CT image reconstructions and MIPs were obtained to evaluate the vascular anatomy. Carotid stenosis measurements (when applicable) are obtained utilizing NASCET criteria, using the distal internal carotid diameter as the denominator. Multiphase CT imaging of the brain was performed following IV bolus contrast injection. Subsequent parametric perfusion maps were calculated using RAPID software. CONTRAST:  100mL ISOVUE-370 IOPAMIDOL (ISOVUE-370) INJECTION 76% COMPARISON:  07/19/2018 CT head. 07/09/2018 MRI head. FINDINGS: CT HEAD FINDINGS Brain: No evidence of acute infarction, hemorrhage, hydrocephalus, extra-axial collection or mass lesion/mass effect. Stable small chronic infarcts are present within the left frontal, left  parietal, and left occipital lobes. Stable chronic microvascular ischemic changes and volume loss of the brain. Vascular: As below. Skull: Normal. Negative for fracture or focal lesion. Sinuses/Orbits: Small fluid levels within the paranasal sinuses. Normal aeration of mastoid air cells. Orbits are unremarkable. Other: None. ASPECTS (Alberta Stroke Program Early CT Score) - Ganglionic level infarction (caudate, lentiform nuclei, internal capsule, insula, M1-M3 cortex): 7 - Supraganglionic infarction (M4-M6 cortex): 3 Total score (0-10 with 10 being normal): 10 Review of the MIP images confirms the above findings CTA NECK FINDINGS Aortic arch: Standard branching. Imaged portion shows no evidence of aneurysm or dissection. No significant stenosis of the major arch vessel origins. Moderate mixed plaque of the aortic arch. Right carotid system: No evidence of dissection, stenosis (50% or greater) or occlusion. Predominant fibrofatty plaque of the carotid bifurcation with minimal less than 30% proximal ICA stenosis. Left carotid system: No evidence of dissection, stenosis (50% or greater) or occlusion. Predominant fibrofatty plaque of the carotid bifurcation with mild less than 50% proximal ICA stenosis. Mild nonstenotic beaded irregularity of the left mid cervical ICA. Vertebral arteries: Codominant. No evidence of dissection, stenosis (50% or greater) or occlusion. Segments of non stenotic beaded irregularity of the vertebral arteries. Skeleton: Moderate severe spondylosis of the cervical spine. Multilevel uncovertebral and facet hypertrophy  with bony foraminal encroachment. Spinal canal stenosis is greatest at the C6-7 level where it is mild-to-moderate. Grade 1 C7-T1 anterolisthesis. Other neck: Negative. Upper chest: Mild centrilobular emphysema of the lung apices. Review of the MIP images confirms the above findings CTA HEAD FINDINGS Anterior circulation: No significant stenosis, proximal occlusion, aneurysm, or  vascular malformation. Non stenotic calcific atherosclerosis of carotid siphons. Posterior circulation: No significant stenosis, proximal occlusion, aneurysm, or vascular malformation. Mild right P2 stenosis. Venous sinuses: As permitted by contrast timing, patent. Anatomic variants: Complete circle-of-Willis. Right fetal PCA. Review of the MIP images confirms the above findings CT Brain Perfusion Findings: CBF (<30%) Volume: 0mL Perfusion (Tmax>6.0s) volume: 0mL Mismatch Volume: 0mL Infarction Location:Negative. IMPRESSION: CT head: 1. No acute intracranial abnormality identified.  ASPECTS is 10. 2. Stable chronic microvascular ischemic changes and volume loss of the brain. 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 CT perfusion head: Negative CT perfusion head. No acute stroke or ischemic penumbra by RAPID perfusion criteria. CTA neck: 1. Patent carotid and vertebral arteries. No dissection, aneurysm, or high-grade stenosis by NASCET criteria. 2. Predominant fibrofatty plaque of bilateral carotid bifurcations with mild less than 50% proximal ICA stenosis. 3. 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 4. Moderate severe spondylosis of the cervical spine greatest at C6-7. CTA head: Patent anterior and posterior intracranial circulation. No large vessel occlusion, aneurysm, or significant stenosis is identified. These results were called by telephone at the time of interpretation on 09/06/2018 at 1:06 am to Dr. Bayard MalesANDOLPH Zineb Glade , who verbally acknowledged these results. Electronically Signed   By: Mitzi HansenLance  Furusawa-Stratton M.D.   On: 09/06/2018 01:09   Ct Head Code Stroke Wo Contrast  Result Date: 09/06/2018 CLINICAL DATA:  73 y/o F; seizure-like activity. Focal neural deficit. Code stroke. EXAM: CT HEAD WITHOUT CONTRAST CT ANGIOGRAPHY HEAD AND NECK CT PERFUSION BRAIN TECHNIQUE: Multidetector CT imaging of the head and  neck was performed using the standard protocol during bolus administration of intravenous contrast. Multiplanar CT image reconstructions and MIPs were obtained to evaluate the vascular anatomy. Carotid stenosis measurements (when applicable) are obtained utilizing NASCET criteria, using the distal internal carotid diameter as the denominator. Multiphase CT imaging of the brain was performed following IV bolus contrast injection. Subsequent parametric perfusion maps were calculated using RAPID software. CONTRAST:  100mL ISOVUE-370 IOPAMIDOL (ISOVUE-370) INJECTION 76% COMPARISON:  07/19/2018 CT head. 07/09/2018 MRI head. FINDINGS: CT HEAD FINDINGS Brain: No evidence of acute infarction, hemorrhage, hydrocephalus, extra-axial collection or mass lesion/mass effect. 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 Stable chronic microvascular ischemic changes and volume loss of the brain. Vascular: As below. Skull: Normal. Negative for fracture or focal lesion. Sinuses/Orbits: Small fluid levels within the paranasal sinuses. Normal aeration of mastoid air cells. Orbits are unremarkable. Other: None. ASPECTS (Alberta Stroke Program Early CT Score) - Ganglionic level infarction (caudate, lentiform nuclei, internal capsule, insula, M1-M3 cortex): 7 - Supraganglionic infarction (M4-M6 cortex): 3 Total score (0-10 with 10 being normal): 10 Review of the MIP images confirms the above findings CTA NECK FINDINGS Aortic arch: Standard branching. Imaged portion shows no evidence of aneurysm or dissection. No significant stenosis of the major arch vessel origins. Moderate mixed plaque of the aortic arch. Right carotid system: No evidence of dissection, stenosis (50% or greater) or occlusion. Predominant fibrofatty plaque of the carotid bifurcation with minimal less than 30% proximal ICA stenosis. Left  carotid system: No evidence of dissection, stenosis (50% or greater) or  occlusion. Predominant fibrofatty plaque of the carotid bifurcation with mild less than 50% proximal ICA stenosis. Mild nonstenotic beaded irregularity of the left mid cervical ICA. Vertebral arteries: Codominant. No evidence of dissection, stenosis (50% or greater) or occlusion. Segments of non stenotic beaded irregularity of the vertebral arteries. Skeleton: Moderate severe spondylosis of the cervical spine. Multilevel uncovertebral and facet hypertrophy with bony foraminal encroachment. Spinal canal stenosis is greatest at the C6-7 level where it is mild-to-moderate. Grade 1 C7-T1 anterolisthesis. Other neck: Negative. Upper chest: Mild centrilobular emphysema of the lung apices. Review of the MIP images confirms the above findings CTA HEAD FINDINGS Anterior circulation: No significant stenosis, proximal occlusion, aneurysm, or vascular malformation. Non stenotic calcific atherosclerosis of carotid siphons. Posterior circulation: No significant stenosis, proximal occlusion, aneurysm, or vascular malformation. Mild right P2 stenosis. Venous sinuses: As permitted by contrast timing, patent. Anatomic variants: Complete circle-of-Willis. Right fetal PCA. Review of the MIP images confirms the above findings CT Brain Perfusion Findings: CBF (<30%) Volume: 0mL Perfusion (Tmax>6.0s) volume: 0mL Mismatch Volume: 0mL Infarction Location:Negative. IMPRESSION: CT head: 1. No acute intracranial abnormality identified.  ASPECTS is 10. 2. Stable chronic microvascular ischemic changes and volume loss of the brain. 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 CT perfusion head: Negative CT perfusion head. No acute stroke or ischemic penumbra by RAPID perfusion criteria. CTA neck: 1. Patent carotid and vertebral arteries. No dissection, aneurysm, or high-grade stenosis by NASCET criteria. 2. Predominant fibrofatty plaque of bilateral carotid bifurcations with mild less than 50% proximal  ICA stenosis. 3. 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 4. Moderate severe spondylosis of the cervical spine greatest at C6-7. CTA head: Patent anterior and posterior intracranial circulation. No large vessel occlusion, aneurysm, or significant stenosis is identified. These results were called by telephone at the time of interpretation on 09/06/2018 at 1:06 am to Dr. Bayard Males , who verbally acknowledged these results. Electronically Signed   By: Mitzi Hansen M.D.   On: 09/06/2018 01:09     .Critical Care Performed by: Darci Current, MD Authorized by: Darci Current, MD   Critical care provider statement:    Critical care time (minutes):  30   Critical care time was exclusive of:  Separately billable procedures and treating other patients   Critical care was necessary to treat or prevent imminent or life-threatening deterioration of the following conditions:  CNS failure or compromise   Critical care was time spent personally by me on the following activities:  Development of treatment plan with patient or surrogate, discussions with consultants, evaluation of patient's response to treatment, examination of patient, obtaining history from patient or surrogate, ordering and performing treatments and interventions, ordering and review of laboratory studies, ordering and review of radiographic studies, pulse oximetry, re-evaluation of patient's condition and review of old charts   I assumed direction of critical care for this patient from another provider in my specialty: no       ____________________________________________   INITIAL IMPRESSION / ASSESSMENT AND PLAN / ED COURSE  As part of my medical decision making, I reviewed the following data within the electronic MEDICAL RECORD NUMBER  73 year old female presenting with above-stated history and physical exam in second lifetime seizure.  Patient with generalized  weakness on clinical exam no focality.  However concern for possible CVA and as such stroke protocol was initiated.  CT head and CT perfusion scan negative.  Patient discussed with Dr. Anne Hahn for hospital admission further evaluation and management.  Regarding the patient's cystitis 1 g IV ceftriaxone administered.  IV Keppra. ____________________________________________  FINAL CLINICAL IMPRESSION(S) / ED DIAGNOSES  Final diagnoses:  Seizure (HCC)  Acute cystitis without hematuria     MEDICATIONS GIVEN DURING THIS VISIT:  Medications  levETIRAcetam (KEPPRA) IVPB 1000 mg/100 mL premix (has no administration in time range)  cefTRIAXone (ROCEPHIN) 1 g in sodium chloride 0.9 % 100 mL IVPB (has no administration in time range)  iopamidol (ISOVUE-370) 76 % injection 100 mL (100 mLs Intravenous Contrast Given 09/06/18 0025)     ED Discharge Orders    None       Note:  This document was prepared using Dragon voice recognition software and may include unintentional dictation errors.    Darci Current, MD 09/06/18 0159    Darci Current, MD 09/06/18 0200

## 2018-09-05 NOTE — ED Notes (Signed)
EDP Brown at bedside assessing pt and performing NIH.

## 2018-09-05 NOTE — ED Triage Notes (Signed)
Pt from home brought in via EMS for "seizure-like" activity per family. EMS denies seizure while transported. BP 140/70; HR 55; 95% RA.

## 2018-09-06 ENCOUNTER — Inpatient Hospital Stay: Payer: Medicare HMO

## 2018-09-06 ENCOUNTER — Emergency Department: Payer: Medicare HMO

## 2018-09-06 DIAGNOSIS — F17210 Nicotine dependence, cigarettes, uncomplicated: Secondary | ICD-10-CM | POA: Diagnosis present

## 2018-09-06 DIAGNOSIS — N183 Chronic kidney disease, stage 3 (moderate): Secondary | ICD-10-CM | POA: Diagnosis present

## 2018-09-06 DIAGNOSIS — Z8673 Personal history of transient ischemic attack (TIA), and cerebral infarction without residual deficits: Secondary | ICD-10-CM | POA: Diagnosis not present

## 2018-09-06 DIAGNOSIS — Z66 Do not resuscitate: Secondary | ICD-10-CM | POA: Diagnosis present

## 2018-09-06 DIAGNOSIS — R29709 NIHSS score 9: Secondary | ICD-10-CM | POA: Diagnosis present

## 2018-09-06 DIAGNOSIS — R404 Transient alteration of awareness: Secondary | ICD-10-CM | POA: Diagnosis not present

## 2018-09-06 DIAGNOSIS — Z8249 Family history of ischemic heart disease and other diseases of the circulatory system: Secondary | ICD-10-CM | POA: Diagnosis not present

## 2018-09-06 DIAGNOSIS — I482 Chronic atrial fibrillation, unspecified: Secondary | ICD-10-CM | POA: Diagnosis present

## 2018-09-06 DIAGNOSIS — I129 Hypertensive chronic kidney disease with stage 1 through stage 4 chronic kidney disease, or unspecified chronic kidney disease: Secondary | ICD-10-CM | POA: Diagnosis present

## 2018-09-06 DIAGNOSIS — R569 Unspecified convulsions: Secondary | ICD-10-CM | POA: Diagnosis not present

## 2018-09-06 DIAGNOSIS — Z7901 Long term (current) use of anticoagulants: Secondary | ICD-10-CM | POA: Diagnosis not present

## 2018-09-06 DIAGNOSIS — G4089 Other seizures: Secondary | ICD-10-CM | POA: Diagnosis present

## 2018-09-06 DIAGNOSIS — Z79899 Other long term (current) drug therapy: Secondary | ICD-10-CM | POA: Diagnosis not present

## 2018-09-06 DIAGNOSIS — R4182 Altered mental status, unspecified: Secondary | ICD-10-CM | POA: Diagnosis present

## 2018-09-06 LAB — CBC
HCT: 34.1 % — ABNORMAL LOW (ref 36.0–46.0)
HEMOGLOBIN: 10.8 g/dL — AB (ref 12.0–15.0)
MCH: 30.3 pg (ref 26.0–34.0)
MCHC: 31.7 g/dL (ref 30.0–36.0)
MCV: 95.8 fL (ref 80.0–100.0)
Platelets: 181 10*3/uL (ref 150–400)
RBC: 3.56 MIL/uL — ABNORMAL LOW (ref 3.87–5.11)
RDW: 16 % — ABNORMAL HIGH (ref 11.5–15.5)
WBC: 6.6 10*3/uL (ref 4.0–10.5)
nRBC: 0 % (ref 0.0–0.2)

## 2018-09-06 LAB — PROTIME-INR
INR: 1.8
Prothrombin Time: 20.7 seconds — ABNORMAL HIGH (ref 11.4–15.2)

## 2018-09-06 LAB — URINALYSIS, ROUTINE W REFLEX MICROSCOPIC
Bacteria, UA: NONE SEEN
Bilirubin Urine: NEGATIVE
Glucose, UA: NEGATIVE mg/dL
Ketones, ur: NEGATIVE mg/dL
Nitrite: NEGATIVE
PROTEIN: 30 mg/dL — AB
RBC / HPF: >50 RBC/hpf — ABNORMAL HIGH (ref 0–5)
Specific Gravity, Urine: 1.012 (ref 1.005–1.030)
Squamous Epithelial / HPF: NONE SEEN (ref 0–5)
WBC, UA: >50 WBC/hpf — ABNORMAL HIGH (ref 0–5)
pH: 6 (ref 5.0–8.0)

## 2018-09-06 LAB — COMPREHENSIVE METABOLIC PANEL
ALT: 10 U/L (ref 0–44)
AST: 17 U/L (ref 15–41)
Albumin: 3 g/dL — ABNORMAL LOW (ref 3.5–5.0)
Alkaline Phosphatase: 67 U/L (ref 38–126)
Anion gap: 6 (ref 5–15)
BUN: 13 mg/dL (ref 8–23)
CHLORIDE: 118 mmol/L — AB (ref 98–111)
CO2: 20 mmol/L — ABNORMAL LOW (ref 22–32)
CREATININE: 1.32 mg/dL — AB (ref 0.44–1.00)
Calcium: 8.2 mg/dL — ABNORMAL LOW (ref 8.9–10.3)
GFR calc Af Amer: 46 mL/min — ABNORMAL LOW (ref >60)
GFR calc non Af Amer: 40 mL/min — ABNORMAL LOW (ref >60)
Glucose, Bld: 87 mg/dL (ref 70–99)
Potassium: 3.5 mmol/L (ref 3.5–5.1)
Sodium: 144 mmol/L (ref 135–145)
Total Bilirubin: 0.3 mg/dL (ref 0.3–1.2)
Total Protein: 6.2 g/dL — ABNORMAL LOW (ref 6.5–8.1)

## 2018-09-06 LAB — DIFFERENTIAL
Abs Immature Granulocytes: 0.02 10*3/uL (ref 0.00–0.07)
Basophils Absolute: 0 10*3/uL (ref 0.0–0.1)
Basophils Relative: 0 %
Eosinophils Absolute: 0.2 10*3/uL (ref 0.0–0.5)
Eosinophils Relative: 3 %
Immature Granulocytes: 0 %
Lymphocytes Relative: 23 %
Lymphs Abs: 1.5 10*3/uL (ref 0.7–4.0)
Monocytes Absolute: 0.4 10*3/uL (ref 0.1–1.0)
Monocytes Relative: 7 %
Neutro Abs: 4.4 10*3/uL (ref 1.7–7.7)
Neutrophils Relative %: 67 %

## 2018-09-06 LAB — TROPONIN I: Troponin I: <0.03 ng/mL (ref <0.03)

## 2018-09-06 LAB — URINE CULTURE: Culture: >=100000 — AB

## 2018-09-06 LAB — GLUCOSE, CAPILLARY: Glucose-Capillary: 108 mg/dL — ABNORMAL HIGH (ref 70–99)

## 2018-09-06 LAB — TSH: TSH: 0.91 u[IU]/mL (ref 0.350–4.500)

## 2018-09-06 LAB — APTT: aPTT: 55 seconds — ABNORMAL HIGH (ref 24–36)

## 2018-09-06 MED ORDER — ONDANSETRON HCL 4 MG/2ML IJ SOLN: 4.0000 mg | Freq: Four times a day (QID) | INTRAMUSCULAR | Status: DC | PRN

## 2018-09-06 MED ORDER — AMLODIPINE BESYLATE 5 MG PO TABS
5.0000 mg | ORAL_TABLET | Freq: Every day | ORAL | Status: DC
  Administered 2018-09-06 – 2018-09-09 (×4): 5 mg via ORAL
  Filled 2018-09-06 (×4): qty 1

## 2018-09-06 MED ORDER — NICOTINE 21 MG/24HR TD PT24
21.0000 mg | MEDICATED_PATCH | Freq: Every day | TRANSDERMAL | Status: DC
  Administered 2018-09-06 – 2018-09-09 (×4): 21 mg via TRANSDERMAL
  Filled 2018-09-06 (×4): qty 1

## 2018-09-06 MED ORDER — LEVETIRACETAM IN NACL 1000 MG/100ML IV SOLN
1000.0000 mg | Freq: Once | INTRAVENOUS | Status: AC
  Administered 2018-09-06: 1000 mg via INTRAVENOUS
  Filled 2018-09-06: qty 100

## 2018-09-06 MED ORDER — DOCUSATE SODIUM 100 MG PO CAPS
100.0000 mg | ORAL_CAPSULE | Freq: Two times a day (BID) | ORAL | Status: DC
  Administered 2018-09-06 – 2018-09-09 (×7): 100 mg via ORAL
  Filled 2018-09-06 (×7): qty 1

## 2018-09-06 MED ORDER — SODIUM CHLORIDE 0.9 % IV SOLN
1.0000 g | Freq: Once | INTRAVENOUS | Status: AC
  Administered 2018-09-06: 1 g via INTRAVENOUS
  Filled 2018-09-06: qty 10

## 2018-09-06 MED ORDER — ONDANSETRON HCL 4 MG PO TABS: 4.0000 mg | ORAL_TABLET | Freq: Four times a day (QID) | ORAL | Status: DC | PRN

## 2018-09-06 MED ORDER — SODIUM CHLORIDE 0.9 % IV SOLN
INTRAVENOUS | Status: DC | PRN
  Administered 2018-09-06 – 2018-09-07 (×2): 250 mL via INTRAVENOUS

## 2018-09-06 MED ORDER — SENNOSIDES-DOCUSATE SODIUM 8.6-50 MG PO TABS: 1.0000 | ORAL_TABLET | Freq: Every evening | ORAL | Status: DC | PRN

## 2018-09-06 MED ORDER — LEVETIRACETAM IN NACL 500 MG/100ML IV SOLN
500.0000 mg | Freq: Two times a day (BID) | INTRAVENOUS | Status: AC
  Administered 2018-09-06 – 2018-09-07 (×2): 500 mg via INTRAVENOUS
  Filled 2018-09-06 (×2): qty 100

## 2018-09-06 MED ORDER — APIXABAN 5 MG PO TABS
5.0000 mg | ORAL_TABLET | Freq: Two times a day (BID) | ORAL | Status: DC
  Administered 2018-09-06 – 2018-09-09 (×7): 5 mg via ORAL
  Filled 2018-09-06 (×7): qty 1

## 2018-09-06 MED ORDER — IOPAMIDOL (ISOVUE-370) INJECTION 76%
100.0000 mL | Freq: Once | INTRAVENOUS | Status: AC | PRN
  Administered 2018-09-06: 100 mL via INTRAVENOUS

## 2018-09-06 MED ORDER — ATORVASTATIN CALCIUM 20 MG PO TABS
20.0000 mg | ORAL_TABLET | Freq: Every day | ORAL | Status: DC
  Administered 2018-09-06 – 2018-09-08 (×3): 20 mg via ORAL
  Filled 2018-09-06 (×3): qty 1

## 2018-09-06 MED ORDER — SODIUM CHLORIDE 0.9 % IV SOLN
1.0000 g | INTRAVENOUS | Status: DC
  Administered 2018-09-06 – 2018-09-07 (×2): 1 g via INTRAVENOUS
  Filled 2018-09-06: qty 1
  Filled 2018-09-06: qty 10
  Filled 2018-09-06: qty 1

## 2018-09-06 MED ORDER — ACETAMINOPHEN 650 MG RE SUPP: 650.0000 mg | Freq: Four times a day (QID) | RECTAL | Status: DC | PRN

## 2018-09-06 MED ORDER — ACETAMINOPHEN 325 MG PO TABS: 650.0000 mg | ORAL_TABLET | Freq: Four times a day (QID) | ORAL | Status: DC | PRN

## 2018-09-06 NOTE — Consult Note (Addendum)
Referring Physician: Garlan Fillers, MD    Chief Complaint: Syncope and collapse  HPI: Destiny Mack is an 73 y.o. female female with history of chronic atrial fibrillation on anticoagulation, chronic kidney disease stage III, hypertension, recent embolic CVA, multiple falls, depression, and tremors presenting to the ED on  today with syncope and collapse.  Apparently she was found down by her roommate/family who called EMS.  Per ED reports patient had a witnessed seizure-like activity.  Patient has no recollection of the events following the episode, she reports that she recalled getting up to get a meal for her 108 year old grandson then suddenly lost consciousness.  She has been evaluated for similar episode on 07/08/2018. Thought to be seizures, EEG at that time was normal with no epileptiform discharges noted.  MRI of the brain was also negative with no acute intracranial abnormality noted.On arrival to the ED she was noted to be postictal therefore she was loaded with Keppra IV.  Initial NIH stroke scale 9.  CT head was negative with no acute intracranial abnormality noted.  ASPECT10.  CTA head and neck negative for large vessel occlusion, hemodynamically significant stenosis or aneurysm.  CTP negative with no acute stroke or ischemic penumbra noted.  Patient is being admitted for further work-up and management.  Date last known well: Unable to determine Time last known well: Unable to determine tPA Given: No: Unable last known well  Past Medical History:  Diagnosis Date  . A-fib (HCC)   . Depression   . Frequent falls   . Hypertension   . Leaking of urine   . Mixed incontinence   . Stroke (HCC)   . Tremor of hands and face     Past Surgical History:  Procedure Laterality Date  . APPENDECTOMY    . TEE WITHOUT CARDIOVERSION N/A 01/04/2018   Procedure: TRANSESOPHAGEAL ECHOCARDIOGRAM (TEE);  Surgeon: Lamar Blinks, MD;  Location: [facility] ORS;  Service: Cardiovascular;  Laterality:  N/A;  . TUBAL LIGATION      Family History  Problem Relation Age of Onset  . Dementia Mother   . Heart failure Mother   . Heart failure Father   . CAD Father    Social History:  reports that she has been smoking. She has a 112.00 pack-year smoking history. She has never used smokeless tobacco. She reports that she does not drink alcohol or use drugs.  Allergies: No Known Allergies  Medications:  I have reviewed the patient's current medications. Prior to Admission: (Not in a hospital admission)  Prior to Admission medications   Medication Sig Start Date End Date Taking? Authorizing Provider  amLODipine (NORVASC) 5 MG tablet Take 5 mg by mouth daily.   Yes [provider]  apixaban (ELIQUIS) 5 MG TABS tablet Take 1 tablet (5 mg total) by mouth 2 (two) times daily. 12/15/17 09/06/18 Yes Pyreddy, Vivien Rota, MD  atorvastatin (LIPITOR) 20 MG tablet Take 20 mg by mouth at bedtime.    Yes [provider]  nicotine (NICODERM CQ - DOSED IN MG/24 HOURS) 21 mg/24hr patch Place 1 patch (21 mg total) onto the skin daily. 08/30/18  Yes Gouru, Aruna, MD  senna-docusate (SENOKOT-S) 8.6-50 MG tablet Take 1 tablet by mouth at bedtime as needed for mild constipation. 08/30/18  Yes Gouru, Deanna Artis, MD    Scheduled: . amLODipine  5 mg Oral Daily  . apixaban  5 mg Oral BID  . atorvastatin  20 mg Oral QHS  . docusate sodium  100 mg Oral BID  .  nicotine  21 mg Transdermal Daily    ROS: Unable to obtain from patient as she is not a good historian.  Physical Examination: Blood pressure (!) 147/66, pulse (!) 59, temperature (!) 96.4 F (35.8 C), temperature source Axillary, resp. rate 15, height 5\' 4"  (1.626 m), weight 63.5 kg, SpO2 98 %.   HEENT-  Normocephalic, no lesions, without obvious abnormality.  Normal external eye and conjunctiva.  Normal TM's bilaterally.  Normal auditory canals and external ears. Normal external nose, mucus membranes and septum.  Normal pharynx. Cardiovascular- S1,  S2 normal, pulses palpable throughout   Lungs- chest clear, no wheezing, rales, normal symmetric air entry Abdomen- soft, non-tender; bowel sounds normal; no masses,  no organomegaly Extremities- no edema Lymph-no adenopathy palpable Musculoskeletal-no joint tenderness, deformity or swelling Skin-warm and dry, no hyperpigmentation, vitiligo, or suspicious lesions  Neurological Exam   Mental Status: Alert, confused.  Speech fluent without evidence of aphasia.  Able to follow 3 step commands without difficulty. Attention span and concentration seemed appropriate  Cranial Nerves: II: Discs flat bilaterally; Visual fields grossly normal, pupils equal, round, reactive to light and accommodation III,IV, VI: ptosis not present, extra-ocular motions intact bilaterally V,VII: smile symmetric, facial light touch sensation intact VIII: hearing normal bilaterally IX,X: gag reflex present XI: bilateral shoulder shrug XII: midline tongue extension Motor: Right :  Upper extremity  flaccid     Left: Upper extremity able to break gravity Right:   Lower extremity   0/5  with increased tone                                    Left: Lower extremity   0/5 Left arm resting tremors Sensory: Pinprick and light touch intact bilaterally Deep Tendon Reflexes: 2+ and symmetric throughout Plantars: Right: upgoing                              Left: upgoing Cerebellar: Unable to assess Gait: not tested due to safety concerns  Data Reviewed  Laboratory Studies:  Basic Metabolic Panel: Recent Labs  Lab 09/04/18 1517 09/06/18 0020  NA 142 144  K 3.6 3.5  CL 116* 118*  CO2 18* 20*  GLUCOSE 92 87  BUN 11 13  CREATININE 1.39* 1.32*  CALCIUM 8.3* 8.2*    Liver Function Tests: Recent Labs  Lab 09/06/18 0020  AST 17  ALT 10  ALKPHOS 67  BILITOT 0.3  PROT 6.2*  ALBUMIN 3.0*   No results for input(s): LIPASE, AMYLASE in the last 168 hours. No results for input(s): AMMONIA in the last 168  hours.  CBC: Recent Labs  Lab 09/04/18 1517 09/06/18 0020  WBC 6.2 6.6  NEUTROABS 4.2 4.4  HGB 11.3* 10.8*  HCT 35.5* 34.1*  MCV 95.2 95.8  PLT 190 181    Cardiac Enzymes: Recent Labs  Lab 09/04/18 1517 09/06/18 0020  TROPONINI <0.03 <0.03    BNP: Invalid input(s): POCBNP  CBG: Recent Labs  Lab 09/05/18 2351 09/06/18 0410  GLUCAP 77 108*    Microbiology: Results for orders placed or performed during the hospital encounter of 09/04/18  Urine culture     Status: Abnormal   Collection Time: 09/04/18  4:10 PM  Result Value Ref Range Status   Specimen Description   Final    URINE, RANDOM Performed at [facility], [address] [place], [place]  [address]    Special Requests   Final    NONE Performed at [facility], [address] [place], [place] [address]    Culture >=100,000 COLONIES/mL YEAST (A)  Final   Report Status 09/06/2018 FINAL  Final    Coagulation Studies: Recent Labs    09/06/18 0053  LABPROT 20.7*  INR 1.80    Urinalysis:  Recent Labs  Lab 09/04/18 1610 09/06/18 0053  COLORURINE YELLOW* YELLOW*  LABSPEC 1.026 1.012  PHURINE 5.0 6.0  GLUCOSEU NEGATIVE NEGATIVE  HGBUR LARGE* LARGE*  BILIRUBINUR NEGATIVE NEGATIVE  KETONESUR NEGATIVE NEGATIVE  PROTEINUR 30* 30*  NITRITE NEGATIVE NEGATIVE  LEUKOCYTESUR LARGE* MODERATE*    Lipid Panel:    Component Value Date/Time   CHOL 162 05/21/2018 0321   TRIG 92 06/27/2018 0433   HDL 33 (L) 05/21/2018 0321   CHOLHDL 4.9 05/21/2018 0321   VLDL 38 05/21/2018 0321   LDLCALC 91 05/21/2018 0321    HgbA1C:  Lab Results  Component Value Date   HGBA1C 5.5 05/21/2018    Urine Drug Screen:      Component Value Date/Time   LABOPIA NONE DETECTED 07/19/2018 1018   COCAINSCRNUR NONE DETECTED 07/19/2018 1018   LABBENZ NONE DETECTED 07/19/2018 1018   AMPHETMU NONE DETECTED 07/19/2018 1018   THCU NONE DETECTED 07/19/2018 1018   LABBARB NONE DETECTED 07/19/2018 1018     Alcohol Level: No results for input(s): ETH in the last 168 hours.  Other results: EKG: normal EKG, normal sinus rhythm, unchanged from previous tracings. Vent. rate 57 BPM PR interval * ms QRS duration 107 ms QT/QTc 522/509 ms P-R-T axes -2 31 5   Imaging: Ct Angio Head W Or Wo Contrast  Result Date: 09/06/2018 CLINICAL DATA:  73 y/o F; seizure-like activity. Focal neural deficit. Code stroke. EXAM: CT HEAD WITHOUT CONTRAST CT ANGIOGRAPHY HEAD AND NECK CT PERFUSION BRAIN TECHNIQUE: Multidetector CT imaging of the head and neck was performed using the standard protocol during bolus administration of intravenous contrast. Multiplanar CT image reconstructions and MIPs were obtained to evaluate the vascular anatomy. Carotid stenosis measurements (when applicable) are obtained utilizing NASCET criteria, using the distal internal carotid diameter as the denominator. Multiphase CT imaging of the brain was performed following IV bolus contrast injection. Subsequent parametric perfusion maps were calculated using RAPID software. CONTRAST:  ISOVUE-370 IOPAMIDOL (ISOVUE-370) INJECTION 76% COMPARISON:  07/19/2018 CT head. 07/09/2018 MRI head. FINDINGS: CT HEAD FINDINGS Brain: No evidence of acute infarction, hemorrhage, hydrocephalus, extra-axial collection or mass lesion/mass effect. 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 Stable chronic microvascular ischemic changes and volume loss of the brain. Vascular: As below. Skull: Normal. Negative for fracture or focal lesion. Sinuses/Orbits: Small fluid levels within the paranasal sinuses. Normal aeration of mastoid air cells. Orbits are unremarkable. Other: None. ASPECTS (Alberta Stroke Program Early CT Score) - Ganglionic level infarction (caudate, lentiform nuclei, internal capsule, insula, M1-M3 cortex): 7 - Supraganglionic infarction (M4-M6 cortex): 3 Total score (0-10 with 10 being  normal): 10 Review of the MIP images confirms the above findings CTA NECK FINDINGS Aortic arch: Standard branching. Imaged portion shows no evidence of aneurysm or dissection. No significant stenosis of the major arch vessel origins. Moderate mixed plaque of the aortic arch. Right carotid system: No evidence of dissection, stenosis (50% or greater) or occlusion. Predominant fibrofatty plaque of the carotid bifurcation with minimal less than 30% proximal ICA stenosis. Left carotid system: No evidence of dissection, stenosis (  50% or greater) or occlusion. Predominant fibrofatty plaque of the carotid bifurcation with mild less than 50% proximal ICA stenosis. Mild nonstenotic beaded irregularity of the left mid cervical ICA. Vertebral arteries: Codominant. No evidence of dissection, stenosis (50% or greater) or occlusion. Segments of non stenotic beaded irregularity of the vertebral arteries. Skeleton: Moderate severe spondylosis of the cervical spine. Multilevel uncovertebral and facet hypertrophy with bony foraminal encroachment. Spinal canal stenosis is greatest at the C6-7 level where it is mild-to-moderate. Grade 1 C7-T1 anterolisthesis. Other neck: Negative. Upper chest: Mild centrilobular emphysema of the lung apices. Review of the MIP images confirms the above findings CTA HEAD FINDINGS Anterior circulation: No significant stenosis, proximal occlusion, aneurysm, or vascular malformation. Non stenotic calcific atherosclerosis of carotid siphons. Posterior circulation: No significant stenosis, proximal occlusion, aneurysm, or vascular malformation. Mild right P2 stenosis. Venous sinuses: As permitted by contrast timing, patent. Anatomic variants: Complete circle-of-Willis. Right fetal PCA. Review of the MIP images confirms the above findings CT Brain Perfusion Findings: CBF (<30%) Volume: 0mL Perfusion (Tmax>6.0s) volume: 0mL Mismatch Volume: 0mL Infarction Location:Negative. IMPRESSION: CT head: 1. No acute  intracranial abnormality identified.  ASPECTS is 10. 2. Stable chronic microvascular ischemic changes and volume loss of the brain. 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 CT perfusion head: Negative CT perfusion head. No acute stroke or ischemic penumbra by RAPID perfusion criteria. CTA neck: 1. Patent carotid and vertebral arteries. No dissection, aneurysm, or high-grade stenosis by NASCET criteria. 2. Predominant fibrofatty plaque of bilateral carotid bifurcations with mild less than 50% proximal ICA stenosis. 3. 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 4. Moderate severe spondylosis of the cervical spine greatest at C6-7. CTA head: Patent anterior and posterior intracranial circulation. No large vessel occlusion, aneurysm, or significant stenosis is identified. These results were called by telephone at the time of interpretation on 09/06/2018 at 1:06 am to Dr. Bayard MalesANDOLPH BROWN , who verbally acknowledged these results. Electronically Signed   By: Mitzi HansenLance  Furusawa-Stratton M.D.   On: 09/06/2018 01:09   Dg Chest 2 View  Result Date: 09/04/2018 CLINICAL DATA:  Altered mental status EXAM: CHEST - 2 VIEW COMPARISON:  07/19/2018 FINDINGS: The heart size and mediastinal contours are within normal limits. Both lungs are clear. Old left clavicle fracture. IMPRESSION: No active cardiopulmonary disease. Electronically Signed   By: Jasmine PangKim  Fujinaga M.D.   On: 09/04/2018 16:26   Ct Angio Neck W Or Wo Contrast  Result Date: 09/06/2018 CLINICAL DATA:  73 y/o F; seizure-like activity. Focal neural deficit. Code stroke. EXAM: CT HEAD WITHOUT CONTRAST CT ANGIOGRAPHY HEAD AND NECK CT PERFUSION BRAIN TECHNIQUE: Multidetector CT imaging of the head and neck was performed using the standard protocol during bolus administration of intravenous contrast. Multiplanar CT image reconstructions and MIPs were obtained to evaluate the  vascular anatomy. Carotid stenosis measurements (when applicable) are obtained utilizing NASCET criteria, using the distal internal carotid diameter as the denominator. Multiphase CT imaging of the brain was performed following IV bolus contrast injection. Subsequent parametric perfusion maps were calculated using RAPID software. CONTRAST:  100mL ISOVUE-370 IOPAMIDOL (ISOVUE-370) INJECTION 76% COMPARISON:  07/19/2018 CT head. 07/09/2018 MRI head. FINDINGS: CT HEAD FINDINGS Brain: No evidence of acute infarction, hemorrhage, hydrocephalus, extra-axial collection or mass lesion/mass effect. 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 Stable chronic microvascular ischemic changes and volume loss of the brain. Vascular: As below. Skull: Normal.  Negative for fracture or focal lesion. Sinuses/Orbits: Small fluid levels within the paranasal sinuses. Normal aeration of mastoid air cells. Orbits are unremarkable. Other: None. ASPECTS (Alberta Stroke Program Early CT Score) - Ganglionic level infarction (caudate, lentiform nuclei, internal capsule, insula, M1-M3 cortex): 7 - Supraganglionic infarction (M4-M6 cortex): 3 Total score (0-10 with 10 being normal): 10 Review of the MIP images confirms the above findings CTA NECK FINDINGS Aortic arch: Standard branching. Imaged portion shows no evidence of aneurysm or dissection. No significant stenosis of the major arch vessel origins. Moderate mixed plaque of the aortic arch. Right carotid system: No evidence of dissection, stenosis (50% or greater) or occlusion. Predominant fibrofatty plaque of the carotid bifurcation with minimal less than 30% proximal ICA stenosis. Left carotid system: No evidence of dissection, stenosis (50% or greater) or occlusion. Predominant fibrofatty plaque of the carotid bifurcation with mild less than 50% proximal ICA stenosis. Mild nonstenotic beaded irregularity of the left mid cervical  ICA. Vertebral arteries: Codominant. No evidence of dissection, stenosis (50% or greater) or occlusion. Segments of non stenotic beaded irregularity of the vertebral arteries. Skeleton: Moderate severe spondylosis of the cervical spine. Multilevel uncovertebral and facet hypertrophy with bony foraminal encroachment. Spinal canal stenosis is greatest at the C6-7 level where it is mild-to-moderate. Grade 1 C7-T1 anterolisthesis. Other neck: Negative. Upper chest: Mild centrilobular emphysema of the lung apices. Review of the MIP images confirms the above findings CTA HEAD FINDINGS Anterior circulation: No significant stenosis, proximal occlusion, aneurysm, or vascular malformation. Non stenotic calcific atherosclerosis of carotid siphons. Posterior circulation: No significant stenosis, proximal occlusion, aneurysm, or vascular malformation. Mild right P2 stenosis. Venous sinuses: As permitted by contrast timing, patent. Anatomic variants: Complete circle-of-Willis. Right fetal PCA. Review of the MIP images confirms the above findings CT Brain Perfusion Findings: CBF (<30%) Volume: 0mL Perfusion (Tmax>6.0s) volume: 0mL Mismatch Volume: 0mL Infarction Location:Negative. IMPRESSION: CT head: 1. No acute intracranial abnormality identified.  ASPECTS is 10. 2. Stable chronic microvascular ischemic changes and volume loss of the brain. 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 CT perfusion head: Negative CT perfusion head. No acute stroke or ischemic penumbra by RAPID perfusion criteria. CTA neck: 1. Patent carotid and vertebral arteries. No dissection, aneurysm, or high-grade stenosis by NASCET criteria. 2. Predominant fibrofatty plaque of bilateral carotid bifurcations with mild less than 50% proximal ICA stenosis. 3. 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 4. Moderate severe spondylosis of the cervical spine greatest  at C6-7. CTA head: Patent anterior and posterior intracranial circulation. No large vessel occlusion, aneurysm, or significant stenosis is identified. These results were called by telephone at the time of interpretation on 09/06/2018 at 1:06 am to Dr. Bayard Males , who verbally acknowledged these results. Electronically Signed   By: Mitzi Hansen M.D.   On: 09/06/2018 01:09   Ct Cerebral Perfusion W Contrast  Result Date: 09/06/2018 CLINICAL DATA:  73 y/o F; seizure-like activity. Focal neural deficit. Code stroke. EXAM: CT HEAD WITHOUT CONTRAST CT ANGIOGRAPHY HEAD AND NECK CT PERFUSION BRAIN TECHNIQUE: Multidetector CT imaging of the head and neck was performed using the standard protocol during bolus administration of intravenous contrast. Multiplanar CT image reconstructions and MIPs were obtained to evaluate the vascular anatomy. Carotid stenosis measurements (when applicable) are obtained utilizing NASCET criteria, using the distal internal carotid diameter as the denominator. Multiphase CT imaging of the brain was performed following IV bolus contrast injection. Subsequent parametric perfusion  maps were calculated using RAPID software. CONTRAST:  ISOVUE-370 IOPAMIDOL (ISOVUE-370) INJECTION 76% COMPARISON:  07/19/2018 CT head. 07/09/2018 MRI head. FINDINGS: CT HEAD FINDINGS Brain: No evidence of acute infarction, hemorrhage, hydrocephalus, extra-axial collection or mass lesion/mass effect. 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 Stable chronic microvascular ischemic changes and volume loss of the brain. Vascular: As below. Skull: Normal. Negative for fracture or focal lesion. Sinuses/Orbits: Small fluid levels within the paranasal sinuses. Normal aeration of mastoid air cells. Orbits are unremarkable. Other: None. ASPECTS (Alberta Stroke Program Early CT Score) - Ganglionic level infarction (caudate, lentiform nuclei,  internal capsule, insula, M1-M3 cortex): 7 - Supraganglionic infarction (M4-M6 cortex): 3 Total score (0-10 with 10 being normal): 10 Review of the MIP images confirms the above findings CTA NECK FINDINGS Aortic arch: Standard branching. Imaged portion shows no evidence of aneurysm or dissection. No significant stenosis of the major arch vessel origins. Moderate mixed plaque of the aortic arch. Right carotid system: No evidence of dissection, stenosis (50% or greater) or occlusion. Predominant fibrofatty plaque of the carotid bifurcation with minimal less than 30% proximal ICA stenosis. Left carotid system: No evidence of dissection, stenosis (50% or greater) or occlusion. Predominant fibrofatty plaque of the carotid bifurcation with mild less than 50% proximal ICA stenosis. Mild nonstenotic beaded irregularity of the left mid cervical ICA. Vertebral arteries: Codominant. No evidence of dissection, stenosis (50% or greater) or occlusion. Segments of non stenotic beaded irregularity of the vertebral arteries. Skeleton: Moderate severe spondylosis of the cervical spine. Multilevel uncovertebral and facet hypertrophy with bony foraminal encroachment. Spinal canal stenosis is greatest at the C6-7 level where it is mild-to-moderate. Grade 1 C7-T1 anterolisthesis. Other neck: Negative. Upper chest: Mild centrilobular emphysema of the lung apices. Review of the MIP images confirms the above findings CTA HEAD FINDINGS Anterior circulation: No significant stenosis, proximal occlusion, aneurysm, or vascular malformation. Non stenotic calcific atherosclerosis of carotid siphons. Posterior circulation: No significant stenosis, proximal occlusion, aneurysm, or vascular malformation. Mild right P2 stenosis. Venous sinuses: As permitted by contrast timing, patent. Anatomic variants: Complete circle-of-Willis. Right fetal PCA. Review of the MIP images confirms the above findings CT Brain Perfusion Findings: CBF (<30%) Volume: 0mL  Perfusion (Tmax>6.0s) volume: 0mL Mismatch Volume: 0mL Infarction Location:Negative. IMPRESSION: CT head: 1. No acute intracranial abnormality identified.  ASPECTS is 10. 2. Stable chronic microvascular ischemic changes and volume loss of the brain. 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 CT perfusion head: Negative CT perfusion head. No acute stroke or ischemic penumbra by RAPID perfusion criteria. CTA neck: 1. Patent carotid and vertebral arteries. No dissection, aneurysm, or high-grade stenosis by NASCET criteria. 2. Predominant fibrofatty plaque of bilateral carotid bifurcations with mild less than 50% proximal ICA stenosis. 3. 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 4. Moderate severe spondylosis of the cervical spine greatest at C6-7. CTA head: Patent anterior and posterior intracranial circulation. No large vessel occlusion, aneurysm, or significant stenosis is identified. These results were called by telephone at the time of interpretation on 09/06/2018 at 1:06 am to Dr. Bayard Males , who verbally acknowledged these results. Electronically Signed   By: Mitzi Hansen M.D.   On: 09/06/2018 01:09   Ct Head Code Stroke Wo Contrast  Result Date: 09/06/2018 CLINICAL DATA:  73 y/o F; seizure-like activity. Focal neural deficit. Code stroke. EXAM: CT HEAD WITHOUT CONTRAST CT ANGIOGRAPHY HEAD AND  NECK CT PERFUSION BRAIN TECHNIQUE: Multidetector CT imaging of the head and neck was performed using the standard protocol during bolus administration of intravenous contrast. Multiplanar CT image reconstructions and MIPs were obtained to evaluate the vascular anatomy. Carotid stenosis measurements (when applicable) are obtained utilizing NASCET criteria, using the distal internal carotid diameter as the denominator. Multiphase CT imaging of the brain was performed following IV bolus contrast injection.  Subsequent parametric perfusion maps were calculated using RAPID software. CONTRAST:  ISOVUE-370 IOPAMIDOL (ISOVUE-370) INJECTION 76% COMPARISON:  07/19/2018 CT head. 07/09/2018 MRI head. FINDINGS: CT HEAD FINDINGS Brain: No evidence of acute infarction, hemorrhage, hydrocephalus, extra-axial collection or mass lesion/mass effect. 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 Stable chronic microvascular ischemic changes and volume loss of the brain. Vascular: As below. Skull: Normal. Negative for fracture or focal lesion. Sinuses/Orbits: Small fluid levels within the paranasal sinuses. Normal aeration of mastoid air cells. Orbits are unremarkable. Other: None. ASPECTS ([place] Stroke Program Early CT Score) - Ganglionic level infarction (caudate, lentiform nuclei, internal capsule, insula, M1-M3 cortex): 7 - Supraganglionic infarction (M4-M6 cortex): 3 Total score (0-10 with 10 being normal): 10 Review of the MIP images confirms the above findings CTA NECK FINDINGS Aortic arch: Standard branching. Imaged portion shows no evidence of aneurysm or dissection. No significant stenosis of the major arch vessel origins. Moderate mixed plaque of the aortic arch. Right carotid system: No evidence of dissection, stenosis (50% or greater) or occlusion. Predominant fibrofatty plaque of the carotid bifurcation with minimal less than 30% proximal ICA stenosis. Left carotid system: No evidence of dissection, stenosis (50% or greater) or occlusion. Predominant fibrofatty plaque of the carotid bifurcation with mild less than 50% proximal ICA stenosis. Mild nonstenotic beaded irregularity of the left mid cervical ICA. Vertebral arteries: Codominant. No evidence of dissection, stenosis (50% or greater) or occlusion. Segments of non stenotic beaded irregularity of the vertebral arteries. Skeleton: Moderate severe spondylosis of the cervical spine. Multilevel  uncovertebral and facet hypertrophy with bony foraminal encroachment. Spinal canal stenosis is greatest at the C6-7 level where it is mild-to-moderate. Grade 1 C7-T1 anterolisthesis. Other neck: Negative. Upper chest: Mild centrilobular emphysema of the lung apices. Review of the MIP images confirms the above findings CTA HEAD FINDINGS Anterior circulation: No significant stenosis, proximal occlusion, aneurysm, or vascular malformation. Non stenotic calcific atherosclerosis of carotid siphons. Posterior circulation: No significant stenosis, proximal occlusion, aneurysm, or vascular malformation. Mild right P2 stenosis. Venous sinuses: As permitted by contrast timing, patent. Anatomic variants: Complete circle-of-Willis. Right fetal PCA. Review of the MIP images confirms the above findings CT Brain Perfusion Findings: CBF (<30%) Volume: 42mL Perfusion (Tmax>6.0s) volume: 31mL Mismatch Volume: 26mL Infarction Location:Negative. IMPRESSION: CT head: 1. No acute intracranial abnormality identified.  ASPECTS is 10. 2. Stable chronic microvascular ischemic changes and volume loss of the brain. 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 CT perfusion head: Negative CT perfusion head. No acute stroke or ischemic penumbra by RAPID perfusion criteria. CTA neck: 1. Patent carotid and vertebral arteries. No dissection, aneurysm, or high-grade stenosis by NASCET criteria. 2. Predominant fibrofatty plaque of bilateral carotid bifurcations with mild less than 50% proximal ICA stenosis. 3. 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 4. Moderate severe spondylosis of the cervical spine greatest at C6-7. CTA head: Patent anterior and posterior intracranial circulation. No large vessel occlusion, aneurysm, or significant stenosis is identified. These  results were called by telephone at the time of interpretation on 09/06/2018 at 1:06 am to  Dr. Bayard Males , who verbally acknowledged these results. Electronically Signed   By: Mitzi Hansen M.D.   On: 09/06/2018 01:09    Patient seen and examined.  Clinical course and management discussed.  Necessary edits performed.  I agree with the above.  Assessment and plan of care developed and discussed below.    Assessment: 73 y.o. female withhistory of chronic atrial fibrillation on anticoagulation, chronic kidney disease stage III, hypertension, recent embolic CVA, multiple falls, depression, and tremors presenting with chief complaints of syncopal episode and witnessed seizure like activity.  She had a similar episode on 11/112019 with negative work-up.  CT head was negative with no acute intracranial abnormality noted.  ASPECT10.  CTA head and neck negative for large vessel occlusion, hemodynamically significant stenosis or aneurysm.  CTP negative with no acute stroke or ischemic penumbra noted.  She is currently being treated for UTI. Unclear if seizure-like event actually epileptic. In the past this has not seemed the case and appeared to be related either to ischemia or hypoperfusion.  Episodes are recurrent though.  Patient with history of strokes in the past.  Has been started on Keppra.  May benefit from an anticonvulsant trial.    Stroke Risk Factors - atrial fibrillation, family history, hyperlipidemia and hypertension  Plan: 1.EEGpending 2. MRI brain without contrast.  Stroke work up to be entertained only if acute ischemic event noted on imaging.   3. Seizure precautions 4. Ativan prn seizure activity 5. Continue Keppra at current dose of 500mg  BID 6. Telemetry monitoring 7. Frequent neuro checks 8. Orthostatic vitals 9. Patient to follow up with neurology on an outpatient basis.    This patient was staffed with Dr. Verlon Au, Thad Ranger who personally evaluated patient, reviewed documentation and agreed with assessment and plan of care as above.  Webb Silversmith,  DNP, FNP-BC Board certified Nurse Practitioner Neurology Department  09/06/2018, 3:33 PM    Thana Farr, MD Neurology [phone]  09/06/2018  7:42 PM

## 2018-09-06 NOTE — ED Notes (Signed)
EDP Brown verb not to collect 2nd trop.  

## 2018-09-06 NOTE — H&P (Signed)
Destiny Mack is an 73 y.o. female.   Chief Complaint: Seizure HPI: The patient with past medical history significant for atrial fibrillation, hypertension, frequent falls and stroke presents to the emergency department via EMS after being found down by her roommate.  Upon arrival to the emergency department the patient was able to state that she was feeling weak which prompted initiation of code stroke.  CT of her head revealed no acute ischemia and we can clear the patient seem to have seized.  Once she was stabilized the emergency department staff call hospitalist service for further evaluation.  Past Medical History:  Diagnosis Date  . A-fib (HCC)   . Depression   . Frequent falls   . Hypertension   . Leaking of urine   . Mixed incontinence   . Stroke (HCC)   . Tremor of hands and face     Past Surgical History:  Procedure Laterality Date  . APPENDECTOMY    . TEE WITHOUT CARDIOVERSION N/A 01/04/2018   Procedure: TRANSESOPHAGEAL ECHOCARDIOGRAM (TEE);  Surgeon: Lamar Blinks, MD;  Location: [facility] ORS;  Service: Cardiovascular;  Laterality: N/A;  . TUBAL LIGATION      Family History  Problem Relation Age of Onset  . Dementia Mother   . Heart failure Mother   . Heart failure Father   . CAD Father    Social History:  reports that she has been smoking. She has a 112.00 pack-year smoking history. She has never used smokeless tobacco. She reports that she does not drink alcohol or use drugs.  Allergies: No Known Allergies  (Not in a hospital admission)   Results for orders placed or performed during the hospital encounter of 09/05/18 (from the past 48 hour(s))  Glucose, capillary     Status: None   Collection Time: 09/05/18 11:51 PM  Result Value Ref Range   Glucose-Capillary 77 70 - 99 mg/dL  CBC     Status: Abnormal   Collection Time: 09/06/18 12:20 AM  Result Value Ref Range   WBC 6.6 4.0 - 10.5 K/uL   RBC 3.56 (L) 3.87 - 5.11 MIL/uL   Hemoglobin 10.8 (L) 12.0 - 15.0  g/dL   HCT 16.1 (L) 09.6 - 04.5 %   MCV 95.8 80.0 - 100.0 fL   MCH 30.3 26.0 - 34.0 pg   MCHC 31.7 30.0 - 36.0 g/dL   RDW 40.9 (H) 81.1 - 91.4 %   Platelets 181 150 - 400 K/uL   nRBC 0.0 0.0 - 0.2 %    Comment: Performed at [facility], [address] [place], [place] [address]  Differential     Status: None   Collection Time: 09/06/18 12:20 AM  Result Value Ref Range   Neutrophils Relative % 67 %   Neutro Abs 4.4 1.7 - 7.7 K/uL   Lymphocytes Relative 23 %   Lymphs Abs 1.5 0.7 - 4.0 K/uL   Monocytes Relative 7 %   Monocytes Absolute 0.4 0.1 - 1.0 K/uL   Eosinophils Relative 3 %   Eosinophils Absolute 0.2 0.0 - 0.5 K/uL   Basophils Relative 0 %   Basophils Absolute 0.0 0.0 - 0.1 K/uL   Immature Granulocytes 0 %   Abs Immature Granulocytes 0.02 0.00 - 0.07 K/uL    Comment: Performed at [facility], [address] [place], [place] [address]  Comprehensive metabolic panel     Status: Abnormal   Collection Time: 09/06/18 12:20 AM  Result Value Ref Range  Sodium 144 135 - 145 mmol/L   Potassium 3.5 3.5 - 5.1 mmol/L   Chloride 118 (H) 98 - 111 mmol/L   CO2 20 (L) 22 - 32 mmol/L   Glucose, Bld 87 70 - 99 mg/dL   BUN 13 8 - 23 mg/dL   Creatinine, Ser 3.83 (H) 0.44 - 1.00 mg/dL   Calcium 8.2 (L) 8.9 - 10.3 mg/dL   Total Protein 6.2 (L) 6.5 - 8.1 g/dL   Albumin 3.0 (L) 3.5 - 5.0 g/dL   AST 17 15 - 41 U/L   ALT 10 0 - 44 U/L   Alkaline Phosphatase 67 38 - 126 U/L   Total Bilirubin 0.3 0.3 - 1.2 mg/dL   GFR calc non Af Amer 40 (L) >60 mL/min   GFR calc Af Amer 46 (L) >60 mL/min   Anion gap 6 5 - 15    Comment: Performed at [facility], [address] [place], [place] [address]  Troponin I - ONCE - STAT     Status: None   Collection Time: 09/06/18 12:20 AM  Result Value Ref Range   Troponin I <0.03 <0.03 ng/mL    Comment: Performed at [facility], [address] [place], [place] [address]  Urinalysis, Routine w reflex microscopic      Status: Abnormal   Collection Time: 09/06/18 12:53 AM  Result Value Ref Range   Color, Urine YELLOW (A) YELLOW   APPearance HAZY (A) CLEAR   Specific Gravity, Urine 1.012 1.005 - 1.030   pH 6.0 5.0 - 8.0   Glucose, UA NEGATIVE NEGATIVE mg/dL   Hgb urine dipstick LARGE (A) NEGATIVE   Bilirubin Urine NEGATIVE NEGATIVE   Ketones, ur NEGATIVE NEGATIVE mg/dL   Protein, ur 30 (A) NEGATIVE mg/dL   Nitrite NEGATIVE NEGATIVE   Leukocytes, UA MODERATE (A) NEGATIVE   RBC / HPF >50 (H) 0 - 5 RBC/hpf   WBC, UA >50 (H) 0 - 5 WBC/hpf   Bacteria, UA NONE SEEN NONE SEEN   Squamous Epithelial / LPF NONE SEEN 0 - 5   Mucus PRESENT     Comment: Performed at [facility], [address] [place], [place] [address]  Protime-INR     Status: Abnormal   Collection Time: 09/06/18 12:53 AM  Result Value Ref Range   Prothrombin Time 20.7 (H) 11.4 - 15.2 seconds   INR 1.80     Comment: Performed at [facility], [address] [place], [place] [address]  APTT     Status: Abnormal   Collection Time: 09/06/18 12:53 AM  Result Value Ref Range   aPTT 55 (H) 24 - 36 seconds    Comment:        IF BASELINE aPTT IS ELEVATED, SUGGEST PATIENT RISK ASSESSMENT BE USED TO DETERMINE APPROPRIATE ANTICOAGULANT THERAPY. Performed at [facility], [address] [place], [place] [address]   Glucose, capillary     Status: Abnormal   Collection Time: 09/06/18  4:10 AM  Result Value Ref Range   Glucose-Capillary 108 (H) 70 - 99 mg/dL   Ct Angio Head W Or Wo Contrast  Result Date: 09/06/2018 CLINICAL DATA:  73 y/o F; seizure-like activity. Focal neural deficit. Code stroke. EXAM: CT HEAD WITHOUT CONTRAST CT ANGIOGRAPHY HEAD AND NECK CT PERFUSION BRAIN TECHNIQUE: Multidetector CT imaging of the head and neck was performed using the standard protocol during bolus administration of intravenous contrast. Multiplanar CT image reconstructions and MIPs were obtained to evaluate the vascular  anatomy. Carotid stenosis measurements (when applicable) are obtained utilizing NASCET criteria, using the distal internal carotid diameter as the denominator. Multiphase CT imaging of the brain was performed following IV bolus contrast injection. Subsequent parametric perfusion maps were calculated using RAPID software. CONTRAST:  100mL ISOVUE-370 IOPAMIDOL (ISOVUE-370) INJECTION 76% COMPARISON:  07/19/2018 CT head. 07/09/2018 MRI head. FINDINGS: CT HEAD FINDINGS Brain: No evidence of acute infarction, hemorrhage, hydrocephalus, extra-axial collection or mass lesion/mass effect. 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 Stable chronic microvascular ischemic changes and volume loss of the brain. Vascular: As below. Skull: Normal. Negative for fracture or focal lesion. Sinuses/Orbits: Small fluid levels within the paranasal sinuses. Normal aeration of mastoid air cells. Orbits are unremarkable. Other: None. ASPECTS (Alberta Stroke Program Early CT Score) - Ganglionic level infarction (caudate, lentiform nuclei, internal capsule, insula, M1-M3 cortex): 7 - Supraganglionic infarction (M4-M6 cortex): 3 Total score (0-10 with 10 being normal): 10 Review of the MIP images confirms the above findings CTA NECK FINDINGS Aortic arch: Standard branching. Imaged portion shows no evidence of aneurysm or dissection. No significant stenosis of the major arch vessel origins. Moderate mixed plaque of the aortic arch. Right carotid system: No evidence of dissection, stenosis (50% or greater) or occlusion. Predominant fibrofatty plaque of the carotid bifurcation with minimal less than 30% proximal ICA stenosis. Left carotid system: No evidence of dissection, stenosis (50% or greater) or occlusion. Predominant fibrofatty plaque of the carotid bifurcation with mild less than 50% proximal ICA stenosis. Mild nonstenotic beaded irregularity of the left mid cervical ICA.  Vertebral arteries: Codominant. No evidence of dissection, stenosis (50% or greater) or occlusion. Segments of non stenotic beaded irregularity of the vertebral arteries. Skeleton: Moderate severe spondylosis of the cervical spine. Multilevel uncovertebral and facet hypertrophy with bony foraminal encroachment. Spinal canal stenosis is greatest at the C6-7 level where it is mild-to-moderate. Grade 1 C7-T1 anterolisthesis. Other neck: Negative. Upper chest: Mild centrilobular emphysema of the lung apices. Review of the MIP images confirms the above findings CTA HEAD FINDINGS Anterior circulation: No significant stenosis, proximal occlusion, aneurysm, or vascular malformation. Non stenotic calcific atherosclerosis of carotid siphons. Posterior circulation: No significant stenosis, proximal occlusion, aneurysm, or vascular malformation. Mild right P2 stenosis. Venous sinuses: As permitted by contrast timing, patent. Anatomic variants: Complete circle-of-Willis. Right fetal PCA. Review of the MIP images confirms the above findings CT Brain Perfusion Findings: CBF (<30%) Volume: 0mL Perfusion (Tmax>6.0s) volume: 0mL Mismatch Volume: 0mL Infarction Location:Negative. IMPRESSION: CT head: 1. No acute intracranial abnormality identified.  ASPECTS is 10. 2. Stable chronic microvascular ischemic changes and volume loss of the brain. 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 CT perfusion head: Negative CT perfusion head. No acute stroke or ischemic penumbra by RAPID perfusion criteria. CTA neck: 1. Patent carotid and vertebral arteries. No dissection, aneurysm, or high-grade stenosis by NASCET criteria. 2. Predominant fibrofatty plaque of bilateral carotid bifurcations with mild less than 50% proximal ICA stenosis. 3. 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 4. Moderate severe spondylosis of the cervical spine greatest at  C6-7. CTA head: Patent anterior and posterior intracranial circulation. No large vessel occlusion, aneurysm, or significant stenosis is identified. These results were called by telephone at the time of interpretation on 09/06/2018 at 1:06 am to Dr. Bayard MalesANDOLPH BROWN , who verbally acknowledged these results. Electronically Signed   By: Mitzi HansenLance  Furusawa-Stratton M.D.   On:  09/06/2018 01:09   Dg Chest 2 View  Result Date: 09/04/2018 CLINICAL DATA:  Altered mental status EXAM: CHEST - 2 VIEW COMPARISON:  07/19/2018 FINDINGS: The heart size and mediastinal contours are within normal limits. Both lungs are clear. Old left clavicle fracture. IMPRESSION: No active cardiopulmonary disease. Electronically Signed   By: Jasmine Pang M.D.   On: 09/04/2018 16:26   Ct Angio Neck W Or Wo Contrast  Result Date: 09/06/2018 CLINICAL DATA:  73 y/o F; seizure-like activity. Focal neural deficit. Code stroke. EXAM: CT HEAD WITHOUT CONTRAST CT ANGIOGRAPHY HEAD AND NECK CT PERFUSION BRAIN TECHNIQUE: Multidetector CT imaging of the head and neck was performed using the standard protocol during bolus administration of intravenous contrast. Multiplanar CT image reconstructions and MIPs were obtained to evaluate the vascular anatomy. Carotid stenosis measurements (when applicable) are obtained utilizing NASCET criteria, using the distal internal carotid diameter as the denominator. Multiphase CT imaging of the brain was performed following IV bolus contrast injection. Subsequent parametric perfusion maps were calculated using RAPID software. CONTRAST:  ISOVUE-370 IOPAMIDOL (ISOVUE-370) INJECTION 76% COMPARISON:  07/19/2018 CT head. 07/09/2018 MRI head. FINDINGS: CT HEAD FINDINGS Brain: No evidence of acute infarction, hemorrhage, hydrocephalus, extra-axial collection or mass lesion/mass effect. 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 Stable chronic microvascular  ischemic changes and volume loss of the brain. Vascular: As below. Skull: Normal. Negative for fracture or focal lesion. Sinuses/Orbits: Small fluid levels within the paranasal sinuses. Normal aeration of mastoid air cells. Orbits are unremarkable. Other: None. ASPECTS (Alberta Stroke Program Early CT Score) - Ganglionic level infarction (caudate, lentiform nuclei, internal capsule, insula, M1-M3 cortex): 7 - Supraganglionic infarction (M4-M6 cortex): 3 Total score (0-10 with 10 being normal): 10 Review of the MIP images confirms the above findings CTA NECK FINDINGS Aortic arch: Standard branching. Imaged portion shows no evidence of aneurysm or dissection. No significant stenosis of the major arch vessel origins. Moderate mixed plaque of the aortic arch. Right carotid system: No evidence of dissection, stenosis (50% or greater) or occlusion. Predominant fibrofatty plaque of the carotid bifurcation with minimal less than 30% proximal ICA stenosis. Left carotid system: No evidence of dissection, stenosis (50% or greater) or occlusion. Predominant fibrofatty plaque of the carotid bifurcation with mild less than 50% proximal ICA stenosis. Mild nonstenotic beaded irregularity of the left mid cervical ICA. Vertebral arteries: Codominant. No evidence of dissection, stenosis (50% or greater) or occlusion. Segments of non stenotic beaded irregularity of the vertebral arteries. Skeleton: Moderate severe spondylosis of the cervical spine. Multilevel uncovertebral and facet hypertrophy with bony foraminal encroachment. Spinal canal stenosis is greatest at the C6-7 level where it is mild-to-moderate. Grade 1 C7-T1 anterolisthesis. Other neck: Negative. Upper chest: Mild centrilobular emphysema of the lung apices. Review of the MIP images confirms the above findings CTA HEAD FINDINGS Anterior circulation: No significant stenosis, proximal occlusion, aneurysm, or vascular malformation. Non stenotic calcific atherosclerosis of  carotid siphons. Posterior circulation: No significant stenosis, proximal occlusion, aneurysm, or vascular malformation. Mild right P2 stenosis. Venous sinuses: As permitted by contrast timing, patent. Anatomic variants: Complete circle-of-Willis. Right fetal PCA. Review of the MIP images confirms the above findings CT Brain Perfusion Findings: CBF (<30%) Volume: 0mL Perfusion (Tmax>6.0s) volume: 0mL Mismatch Volume: 0mL Infarction Location:Negative. IMPRESSION: CT head: 1. No acute intracranial abnormality identified.  ASPECTS is 10. 2. Stable chronic microvascular ischemic changes and volume loss of the brain. Stable small chronic infarcts in left frontal, left  parietal, and left occipital lobes. CT perfusion head: Negative CT perfusion head. No acute stroke or ischemic penumbra by RAPID perfusion criteria. CTA neck: 1. Patent carotid and vertebral arteries. No dissection, aneurysm, or high-grade stenosis by NASCET criteria. 2. Predominant fibrofatty plaque of bilateral carotid bifurcations with mild less than 50% proximal ICA stenosis. 3. 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 4. Moderate severe spondylosis of the cervical spine greatest at C6-7. CTA head: Patent anterior and posterior intracranial circulation. No large vessel occlusion, aneurysm, or significant stenosis is identified. These results were called by telephone at the time of interpretation on 09/06/2018 at 1:06 am to Dr. Bayard Males , who verbally acknowledged these results. Electronically Signed   By: Mitzi Hansen M.D.   On: 09/06/2018 01:09   Ct Cerebral Perfusion W Contrast  Result Date: 09/06/2018 CLINICAL DATA:  73 y/o F; seizure-like activity. Focal neural deficit. Code stroke. EXAM: CT HEAD WITHOUT CONTRAST CT ANGIOGRAPHY HEAD AND NECK CT PERFUSION BRAIN TECHNIQUE: Multidetector CT imaging of the head and neck was performed using the standard protocol during bolus  administration of intravenous contrast. Multiplanar CT image reconstructions and MIPs were obtained to evaluate the vascular anatomy. Carotid stenosis measurements (when applicable) are obtained utilizing NASCET criteria, using the distal internal carotid diameter as the denominator. Multiphase CT imaging of the brain was performed following IV bolus contrast injection. Subsequent parametric perfusion maps were calculated using RAPID software. CONTRAST:  ISOVUE-370 IOPAMIDOL (ISOVUE-370) INJECTION 76% COMPARISON:  07/19/2018 CT head. 07/09/2018 MRI head. FINDINGS: CT HEAD FINDINGS Brain: No evidence of acute infarction, hemorrhage, hydrocephalus, extra-axial collection or mass lesion/mass effect. 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 Stable chronic microvascular ischemic changes and volume loss of the brain. Vascular: As below. Skull: Normal. Negative for fracture or focal lesion. Sinuses/Orbits: Small fluid levels within the paranasal sinuses. Normal aeration of mastoid air cells. Orbits are unremarkable. Other: None. ASPECTS (Alberta Stroke Program Early CT Score) - Ganglionic level infarction (caudate, lentiform nuclei, internal capsule, insula, M1-M3 cortex): 7 - Supraganglionic infarction (M4-M6 cortex): 3 Total score (0-10 with 10 being normal): 10 Review of the MIP images confirms the above findings CTA NECK FINDINGS Aortic arch: Standard branching. Imaged portion shows no evidence of aneurysm or dissection. No significant stenosis of the major arch vessel origins. Moderate mixed plaque of the aortic arch. Right carotid system: No evidence of dissection, stenosis (50% or greater) or occlusion. Predominant fibrofatty plaque of the carotid bifurcation with minimal less than 30% proximal ICA stenosis. Left carotid system: No evidence of dissection, stenosis (50% or greater) or occlusion. Predominant fibrofatty plaque of the carotid  bifurcation with mild less than 50% proximal ICA stenosis. Mild nonstenotic beaded irregularity of the left mid cervical ICA. Vertebral arteries: Codominant. No evidence of dissection, stenosis (50% or greater) or occlusion. Segments of non stenotic beaded irregularity of the vertebral arteries. Skeleton: Moderate severe spondylosis of the cervical spine. Multilevel uncovertebral and facet hypertrophy with bony foraminal encroachment. Spinal canal stenosis is greatest at the C6-7 level where it is mild-to-moderate. Grade 1 C7-T1 anterolisthesis. Other neck: Negative. Upper chest: Mild centrilobular emphysema of the lung apices. Review of the MIP images confirms the above findings CTA HEAD FINDINGS Anterior circulation: No significant stenosis, proximal occlusion, aneurysm, or vascular malformation. Non stenotic calcific atherosclerosis of carotid siphons. Posterior circulation: No significant stenosis, proximal occlusion, aneurysm, or vascular malformation. Mild right P2 stenosis. Venous sinuses:  As permitted by contrast timing, patent. Anatomic variants: Complete circle-of-Willis. Right fetal PCA. Review of the MIP images confirms the above findings CT Brain Perfusion Findings: CBF (<30%) Volume: 0mL Perfusion (Tmax>6.0s) volume: 0mL Mismatch Volume: 0mL Infarction Location:Negative. IMPRESSION: CT head: 1. No acute intracranial abnormality identified.  ASPECTS is 10. 2. Stable chronic microvascular ischemic changes and volume loss of the brain. 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 CT perfusion head: Negative CT perfusion head. No acute stroke or ischemic penumbra by RAPID perfusion criteria. CTA neck: 1. Patent carotid and vertebral arteries. No dissection, aneurysm, or high-grade stenosis by NASCET criteria. 2. Predominant fibrofatty plaque of bilateral carotid bifurcations with mild less than 50% proximal ICA stenosis. 3. Findings of mild fibromuscular dysplasia  within the left carotid and bilateral vertebral systems. 4. Moderate severe spondylosis of the cervical spine greatest at C6-7. CTA head: Patent anterior and posterior intracranial circulation. No large vessel occlusion, aneurysm, or significant stenosis is identified. These results were called by telephone at the time of interpretation on 09/06/2018 at 1:06 am to Dr. Bayard MalesANDOLPH BROWN , who verbally acknowledged these results. Electronically Signed   By: Mitzi HansenLance  Furusawa-Stratton M.D.   On: 09/06/2018 01:09   Ct Head Code Stroke Wo Contrast  Result Date: 09/06/2018 CLINICAL DATA:  73 y/o F; seizure-like activity. Focal neural deficit. Code stroke. EXAM: CT HEAD WITHOUT CONTRAST CT ANGIOGRAPHY HEAD AND NECK CT PERFUSION BRAIN TECHNIQUE: Multidetector CT imaging of the head and neck was performed using the standard protocol during bolus administration of intravenous contrast. Multiplanar CT image reconstructions and MIPs were obtained to evaluate the vascular anatomy. Carotid stenosis measurements (when applicable) are obtained utilizing NASCET criteria, using the distal internal carotid diameter as the denominator. Multiphase CT imaging of the brain was performed following IV bolus contrast injection. Subsequent parametric perfusion maps were calculated using RAPID software. CONTRAST:  100mL ISOVUE-370 IOPAMIDOL (ISOVUE-370) INJECTION 76% COMPARISON:  07/19/2018 CT head. 07/09/2018 MRI head. FINDINGS: CT HEAD FINDINGS Brain: No evidence of acute infarction, hemorrhage, hydrocephalus, extra-axial collection or mass lesion/mass effect. 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 Stable chronic microvascular ischemic changes and volume loss of the brain. Vascular: As below. Skull: Normal. Negative for fracture or focal lesion. Sinuses/Orbits: Small fluid levels within the paranasal sinuses. Normal aeration of mastoid air cells. Orbits are unremarkable.  Other: None. ASPECTS (Alberta Stroke Program Early CT Score) - Ganglionic level infarction (caudate, lentiform nuclei, internal capsule, insula, M1-M3 cortex): 7 - Supraganglionic infarction (M4-M6 cortex): 3 Total score (0-10 with 10 being normal): 10 Review of the MIP images confirms the above findings CTA NECK FINDINGS Aortic arch: Standard branching. Imaged portion shows no evidence of aneurysm or dissection. No significant stenosis of the major arch vessel origins. Moderate mixed plaque of the aortic arch. Right carotid system: No evidence of dissection, stenosis (50% or greater) or occlusion. Predominant fibrofatty plaque of the carotid bifurcation with minimal less than 30% proximal ICA stenosis. Left carotid system: No evidence of dissection, stenosis (50% or greater) or occlusion. Predominant fibrofatty plaque of the carotid bifurcation with mild less than 50% proximal ICA stenosis. Mild nonstenotic beaded irregularity of the left mid cervical ICA. Vertebral arteries: Codominant. No evidence of dissection, stenosis (50% or greater) or occlusion. Segments of non stenotic beaded irregularity of the vertebral arteries. Skeleton: Moderate severe spondylosis of the cervical spine. Multilevel uncovertebral and facet hypertrophy with bony foraminal encroachment. Spinal canal stenosis  is greatest at the C6-7 level where it is mild-to-moderate. Grade 1 C7-T1 anterolisthesis. Other neck: Negative. Upper chest: Mild centrilobular emphysema of the lung apices. Review of the MIP images confirms the above findings CTA HEAD FINDINGS Anterior circulation: No significant stenosis, proximal occlusion, aneurysm, or vascular malformation. Non stenotic calcific atherosclerosis of carotid siphons. Posterior circulation: No significant stenosis, proximal occlusion, aneurysm, or vascular malformation. Mild right P2 stenosis. Venous sinuses: As permitted by contrast timing, patent. Anatomic variants: Complete circle-of-Willis.  Right fetal PCA. Review of the MIP images confirms the above findings CT Brain Perfusion Findings: CBF (<30%) Volume: 0mL Perfusion (Tmax>6.0s) volume: 0mL Mismatch Volume: 0mL Infarction Location:Negative. IMPRESSION: CT head: 1. No acute intracranial abnormality identified.  ASPECTS is 10. 2. Stable chronic microvascular ischemic changes and volume loss of the brain. 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 CT perfusion head: Negative CT perfusion head. No acute stroke or ischemic penumbra by RAPID perfusion criteria. CTA neck: 1. Patent carotid and vertebral arteries. No dissection, aneurysm, or high-grade stenosis by NASCET criteria. 2. Predominant fibrofatty plaque of bilateral carotid bifurcations with mild less than 50% proximal ICA stenosis. 3. 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 4. Moderate severe spondylosis of the cervical spine greatest at C6-7. CTA head: Patent anterior and posterior intracranial circulation. No large vessel occlusion, aneurysm, or significant stenosis is identified. These results were called by telephone at the time of interpretation on 09/06/2018 at 1:06 am to Dr. Bayard Males , who verbally acknowledged these results. Electronically Signed   By: Mitzi Hansen M.D.   On: 09/06/2018 01:09    Review of Systems  Unable to perform ROS: Medical condition    Blood pressure 117/64, pulse (!) 47, temperature (!) 96.4 F (35.8 C), temperature source Axillary, resp. rate 12, height 5\' 4"  (1.626 m), weight 63.5 kg, SpO2 99 %. Physical Exam  Vitals reviewed. Constitutional: She is oriented to person, place, and time. She appears well-developed and well-nourished. No distress.  HENT:  Head: Normocephalic and atraumatic.  Mouth/Throat: Oropharynx is clear and moist.  Eyes: Pupils are equal, round, and reactive to light. Conjunctivae and EOM are normal. No scleral  icterus.  Neck: Normal range of motion. Neck supple. No JVD present. No tracheal deviation present. No thyromegaly present.  Cardiovascular: Normal rate, regular rhythm and normal heart sounds. Exam reveals no gallop and no friction rub.  No murmur heard. Respiratory: Effort normal and breath sounds normal.  GI: Soft. Bowel sounds are normal. She exhibits no distension. There is no abdominal tenderness.  Genitourinary:    Genitourinary Comments: Deferred   Musculoskeletal: Normal range of motion.        General: No edema.  Lymphadenopathy:    She has no cervical adenopathy.  Neurological: She is alert and oriented to person, place, and time. No cranial nerve deficit. She exhibits normal muscle tone.  Skin: Skin is warm and dry. No rash noted. No erythema.  Psychiatric:  Patient is postictal and barely coherent or cooperative with physical exam     Assessment/Plan This is a 73 year old female admitted for seizure. 1.  Seizure: New onset; loaded with Keppra in the emergency department.  Now postictal very somnolent.  Continue to monitor for seizure activity.  Consult neurology. 2.  Hypertension: Controlled; continue amlodipine 3.  Atrial fibrillation: Rate controlled; continue Eliquis 4.  History of stroke: The patient has a history of falls and likely has gait disturbance secondary  to her remote strokes.  Continue secondary prevention by continuing statin therapy and control of atrial fibrillation and hypertension 5.  DVT prophylaxis: As above 6.  GI prophylaxis: None The patient is a full code.  Time spent on admission orders and patient care approximately 45 minutes  Arnaldo Natal, MD 09/06/2018, 8:05 AM

## 2018-09-06 NOTE — ED Notes (Signed)
Pt on antibiotics for UTI. Family reports seizure 3 weeks ago.

## 2018-09-06 NOTE — ED Notes (Signed)
EDP Manson Passey notified of continued need for IV access for CT.

## 2018-09-06 NOTE — ED Notes (Signed)
Pt asleep.

## 2018-09-06 NOTE — ED Notes (Signed)
Pt putting out dark amber urine through foley cath.

## 2018-09-06 NOTE — ED Notes (Signed)
ED TO INPATIENT HANDOFF REPORT  Name/Age/Gender Destiny Mack 73 y.o. female  Code Status    Code Status Orders  (From admission, onward)         Start     Ordered   09/06/18 0729  Full code  Continuous     09/06/18 0728        Code Status History    Date Active Date Inactive Code Status Order ID Comments User Context   08/28/2018 1307 08/31/2018 2356 Full Code [id number]  Ihor Austin, MD Inpatient   07/06/2018 1628 07/09/2018 2237 Full Code [id number]  Altamese Dilling, MD ED   06/24/2018 1648 07/03/2018 1942 Full Code [id number]  Adrian Saran, MD Inpatient   05/20/2018 1847 05/21/2018 2212 Full Code [id number]  Ramonita Lab, MD Inpatient   05/20/2018 1847 05/20/2018 1847 Full Code [id number]  Ramonita Lab, MD Inpatient   12/31/2017 1538 01/04/2018 2130 DNR [id number]  Milagros Loll, MD ED   12/13/2017 1630 12/15/2017 2016 Full Code [id number]  Alford Highland, MD ED   11/22/2017 0202 11/23/2017 2009 DNR [id number]  Bertrum Sol, MD ED   11/22/2017 0150 11/22/2017 0202 Full Code [id number]  Salary, Evelena Asa, MD ED   11/01/2017 1026 11/02/2017 0155 DNR [id number]  Delfino Lovett, MD Inpatient   10/29/2017 1256 11/01/2017 1026 Full Code [id number]  Alford Highland, MD ED    Advance Directive Documentation     Most Recent Value  Type of Advance Directive  Living will  Pre-existing out of facility DNR order (yellow form or pink MOST form)  -  "MOST" Form in Place?  -      Home/SNF/Other Home  Chief Complaint Seizure  Level of Care/Admitting Diagnosis ED Disposition    ED Disposition Condition Comment   Admit  Hospital Area: [facility] [[id number]]  Level of Care: Med-Surg [16]  Diagnosis: Seizure [facility]) [[id number]]  Admitting Physician: Arnaldo Natal [[id number]]  Attending Physician: Arnaldo Natal [[id number]]  Estimated length of stay: past midnight tomorrow  Certification:: I certify this patient will need inpatient services for at least 2 midnights  PT Class (Do  Not Modify): Inpatient [101]  PT Acc Code (Do Not Modify): Private [1]       Medical History Past Medical History:  Diagnosis Date  . A-fib (HCC)   . Depression   . Frequent falls   . Hypertension   . Leaking of urine   . Mixed incontinence   . Stroke (HCC)   . Tremor of hands and face     Allergies No Known Allergies  IV Location/Drains/Wounds Patient Lines/Drains/Airways Status   Active Line/Drains/Airways    Name:   Placement date:   Placement time:   Site:   Days:   Peripheral IV 09/06/18 Left Antecubital   09/06/18    0018    Antecubital   less than 1   Urethral Catheter Kassie RN 16 Fr.   09/04/18    1847    -   2   Pressure Injury 06/28/18 Stage II -  Partial thickness loss of dermis presenting as a shallow open ulcer with a red, pink wound bed without slough.   06/28/18    0800     70          Labs/Imaging Results for orders placed or performed during the hospital encounter of 09/05/18 (from the past 48 hour(s))  Glucose, capillary     Status: None   Collection Time: 09/05/18 11:51 PM  Result Value Ref  Range   Glucose-Capillary 77 70 - 99 mg/dL  CBC     Status: Abnormal   Collection Time: 09/06/18 12:20 AM  Result Value Ref Range   WBC 6.6 4.0 - 10.5 K/uL   RBC 3.56 (L) 3.87 - 5.11 MIL/uL   Hemoglobin 10.8 (L) 12.0 - 15.0 g/dL   HCT 96.034.1 (L) 45.436.0 - 09.846.0 %   MCV 95.8 80.0 - 100.0 fL   MCH 30.3 26.0 - 34.0 pg   MCHC 31.7 30.0 - 36.0 g/dL   RDW 11.916.0 (H) 14.711.5 - 82.915.5 %   Platelets 181 150 - 400 K/uL   nRBC 0.0 0.0 - 0.2 %    Comment: Performed at [facility], [address] [place], [place] [address]  Differential     Status: None   Collection Time: 09/06/18 12:20 AM  Result Value Ref Range   Neutrophils Relative % 67 %   Neutro Abs 4.4 1.7 - 7.7 K/uL   Lymphocytes Relative 23 %   Lymphs Abs 1.5 0.7 - 4.0 K/uL   Monocytes Relative 7 %   Monocytes Absolute 0.4 0.1 - 1.0 K/uL   Eosinophils Relative 3 %   Eosinophils Absolute 0.2 0.0 - 0.5  K/uL   Basophils Relative 0 %   Basophils Absolute 0.0 0.0 - 0.1 K/uL   Immature Granulocytes 0 %   Abs Immature Granulocytes 0.02 0.00 - 0.07 K/uL    Comment: Performed at [facility], [address] [place], [place] [address]  Comprehensive metabolic panel     Status: Abnormal   Collection Time: 09/06/18 12:20 AM  Result Value Ref Range   Sodium 144 135 - 145 mmol/L   Potassium 3.5 3.5 - 5.1 mmol/L   Chloride 118 (H) 98 - 111 mmol/L   CO2 20 (L) 22 - 32 mmol/L   Glucose, Bld 87 70 - 99 mg/dL   BUN 13 8 - 23 mg/dL   Creatinine, Ser 8.461.32 (H) 0.44 - 1.00 mg/dL   Calcium 8.2 (L) 8.9 - 10.3 mg/dL   Total Protein 6.2 (L) 6.5 - 8.1 g/dL   Albumin 3.0 (L) 3.5 - 5.0 g/dL   AST 17 15 - 41 U/L   ALT 10 0 - 44 U/L   Alkaline Phosphatase 67 38 - 126 U/L   Total Bilirubin 0.3 0.3 - 1.2 mg/dL   GFR calc non Af Amer 40 (L) >60 mL/min   GFR calc Af Amer 46 (L) >60 mL/min   Anion gap 6 5 - 15    Comment: Performed at [facility], [address] [place], [place] [address]  Troponin I - ONCE - STAT     Status: None   Collection Time: 09/06/18 12:20 AM  Result Value Ref Range   Troponin I <0.03 <0.03 ng/mL    Comment: Performed at [facility], [address] [place], [place] [address]  TSH     Status: None   Collection Time: 09/06/18 12:20 AM  Result Value Ref Range   TSH 0.910 0.350 - 4.500 uIU/mL    Comment: Performed by a 3rd Generation assay with a functional sensitivity of <=0.01 uIU/mL. Performed at [facility], [address] [place], [place] [address]   Urinalysis, Routine w reflex microscopic     Status: Abnormal   Collection Time: 09/06/18 12:53 AM  Result Value Ref Range   Color, Urine YELLOW (A) YELLOW   APPearance HAZY (A) CLEAR   Specific Gravity, Urine 1.012 1.005 - 1.030  pH 6.0 5.0 - 8.0   Glucose, UA NEGATIVE NEGATIVE mg/dL   Hgb urine dipstick LARGE (A) NEGATIVE   Bilirubin Urine NEGATIVE NEGATIVE   Ketones, ur NEGATIVE  NEGATIVE mg/dL   Protein, ur 30 (A) NEGATIVE mg/dL   Nitrite NEGATIVE NEGATIVE   Leukocytes, UA MODERATE (A) NEGATIVE   RBC / HPF >50 (H) 0 - 5 RBC/hpf   WBC, UA >50 (H) 0 - 5 WBC/hpf   Bacteria, UA NONE SEEN NONE SEEN   Squamous Epithelial / LPF NONE SEEN 0 - 5   Mucus PRESENT     Comment: Performed at [facility], [address] [place], [place] [address]  Protime-INR     Status: Abnormal   Collection Time: 09/06/18 12:53 AM  Result Value Ref Range   Prothrombin Time 20.7 (H) 11.4 - 15.2 seconds   INR 1.80     Comment: Performed at [facility], [address] [place], [place] [address]  APTT     Status: Abnormal   Collection Time: 09/06/18 12:53 AM  Result Value Ref Range   aPTT 55 (H) 24 - 36 seconds    Comment:        IF BASELINE aPTT IS ELEVATED, SUGGEST PATIENT RISK ASSESSMENT BE USED TO DETERMINE APPROPRIATE ANTICOAGULANT THERAPY. Performed at [facility], [address] [place], [place] [address]   Glucose, capillary     Status: Abnormal   Collection Time: 09/06/18  4:10 AM  Result Value Ref Range   Glucose-Capillary 108 (H) 70 - 99 mg/dL   Ct Angio Head W Or Wo Contrast  Result Date: 09/06/2018 CLINICAL DATA:  73 y/o F; seizure-like activity. Focal neural deficit. Code stroke. EXAM: CT HEAD WITHOUT CONTRAST CT ANGIOGRAPHY HEAD AND NECK CT PERFUSION BRAIN TECHNIQUE: Multidetector CT imaging of the head and neck was performed using the standard protocol during bolus administration of intravenous contrast. Multiplanar CT image reconstructions and MIPs were obtained to evaluate the vascular anatomy. Carotid stenosis measurements (when applicable) are obtained utilizing NASCET criteria, using the distal internal carotid diameter as the denominator. Multiphase CT imaging of the brain was performed following IV bolus contrast injection. Subsequent parametric perfusion maps were calculated using RAPID software. CONTRAST:  ISOVUE-370  IOPAMIDOL (ISOVUE-370) INJECTION 76% COMPARISON:  07/19/2018 CT head. 07/09/2018 MRI head. FINDINGS: CT HEAD FINDINGS Brain: No evidence of acute infarction, hemorrhage, hydrocephalus, extra-axial collection or mass lesion/mass effect. 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 Stable chronic microvascular ischemic changes and volume loss of the brain. Vascular: As below. Skull: Normal. Negative for fracture or focal lesion. Sinuses/Orbits: Small fluid levels within the paranasal sinuses. Normal aeration of mastoid air cells. Orbits are unremarkable. Other: None. ASPECTS (Alberta Stroke Program Early CT Score) - Ganglionic level infarction (caudate, lentiform nuclei, internal capsule, insula, M1-M3 cortex): 7 - Supraganglionic infarction (M4-M6 cortex): 3 Total score (0-10 with 10 being normal): 10 Review of the MIP images confirms the above findings CTA NECK FINDINGS Aortic arch: Standard branching. Imaged portion shows no evidence of aneurysm or dissection. No significant stenosis of the major arch vessel origins. Moderate mixed plaque of the aortic arch. Right carotid system: No evidence of dissection, stenosis (50% or greater) or occlusion. Predominant fibrofatty plaque of the carotid bifurcation with minimal less than 30% proximal ICA stenosis. Left carotid system: No evidence of dissection, stenosis (50% or greater) or occlusion. Predominant fibrofatty plaque of the carotid bifurcation with mild less than 50%  proximal ICA stenosis. Mild nonstenotic beaded irregularity of the left mid cervical ICA. Vertebral arteries: Codominant. No evidence of dissection, stenosis (50% or greater) or occlusion. Segments of non stenotic beaded irregularity of the vertebral arteries. Skeleton: Moderate severe spondylosis of the cervical spine. Multilevel uncovertebral and facet hypertrophy with bony foraminal encroachment. Spinal canal stenosis is greatest at the  C6-7 level where it is mild-to-moderate. Grade 1 C7-T1 anterolisthesis. Other neck: Negative. Upper chest: Mild centrilobular emphysema of the lung apices. Review of the MIP images confirms the above findings CTA HEAD FINDINGS Anterior circulation: No significant stenosis, proximal occlusion, aneurysm, or vascular malformation. Non stenotic calcific atherosclerosis of carotid siphons. Posterior circulation: No significant stenosis, proximal occlusion, aneurysm, or vascular malformation. Mild right P2 stenosis. Venous sinuses: As permitted by contrast timing, patent. Anatomic variants: Complete circle-of-Willis. Right fetal PCA. Review of the MIP images confirms the above findings CT Brain Perfusion Findings: CBF (<30%) Volume: 55mL Perfusion (Tmax>6.0s) volume: 7mL Mismatch Volume: 65mL Infarction Location:Negative. IMPRESSION: CT head: 1. No acute intracranial abnormality identified.  ASPECTS is 10. 2. Stable chronic microvascular ischemic changes and volume loss of the brain. 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 CT perfusion head: Negative CT perfusion head. No acute stroke or ischemic penumbra by RAPID perfusion criteria. CTA neck: 1. Patent carotid and vertebral arteries. No dissection, aneurysm, or high-grade stenosis by NASCET criteria. 2. Predominant fibrofatty plaque of bilateral carotid bifurcations with mild less than 50% proximal ICA stenosis. 3. 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 4. Moderate severe spondylosis of the cervical spine greatest at C6-7. CTA head: Patent anterior and posterior intracranial circulation. No large vessel occlusion, aneurysm, or significant stenosis is identified. These results were called by telephone at the time of interpretation on 09/06/2018 at 1:06 am to Dr. Bayard Males , who verbally acknowledged these results. Electronically Signed   By: Mitzi Hansen M.D.   On: 09/06/2018 01:09   Ct Angio Neck W Or Wo Contrast  Result Date: 09/06/2018 CLINICAL DATA:  73 y/o F; seizure-like activity. Focal neural deficit. Code stroke. EXAM: CT HEAD WITHOUT CONTRAST CT ANGIOGRAPHY HEAD AND NECK CT PERFUSION BRAIN TECHNIQUE: Multidetector CT imaging of the head and neck was performed using the standard protocol during bolus administration of intravenous contrast. Multiplanar CT image reconstructions and MIPs were obtained to evaluate the vascular anatomy. Carotid stenosis measurements (when applicable) are obtained utilizing NASCET criteria, using the distal internal carotid diameter as the denominator. Multiphase CT imaging of the brain was performed following IV bolus contrast injection. Subsequent parametric perfusion maps were calculated using RAPID software. CONTRAST:  ISOVUE-370 IOPAMIDOL (ISOVUE-370) INJECTION 76% COMPARISON:  07/19/2018 CT head. 07/09/2018 MRI head. FINDINGS: CT HEAD FINDINGS Brain: No evidence of acute infarction, hemorrhage, hydrocephalus, extra-axial collection or mass lesion/mass effect. 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 Stable chronic microvascular ischemic changes and volume loss of the brain. Vascular: As below. Skull: Normal. Negative for fracture or focal lesion. Sinuses/Orbits: Small fluid levels within the paranasal sinuses. Normal aeration of mastoid air cells. Orbits are unremarkable. Other: None. ASPECTS (Alberta Stroke Program Early CT Score) - Ganglionic level infarction (caudate, lentiform nuclei, internal capsule, insula, M1-M3 cortex): 7 - Supraganglionic infarction (M4-M6 cortex): 3 Total score (0-10 with 10 being normal): 10 Review of the MIP images confirms the above findings CTA NECK FINDINGS Aortic arch: Standard branching. Imaged portion shows no  evidence of aneurysm or dissection. No significant stenosis of the major arch vessel  origins. Moderate mixed plaque of the aortic arch. Right carotid system: No evidence of dissection, stenosis (50% or greater) or occlusion. Predominant fibrofatty plaque of the carotid bifurcation with minimal less than 30% proximal ICA stenosis. Left carotid system: No evidence of dissection, stenosis (50% or greater) or occlusion. Predominant fibrofatty plaque of the carotid bifurcation with mild less than 50% proximal ICA stenosis. Mild nonstenotic beaded irregularity of the left mid cervical ICA. Vertebral arteries: Codominant. No evidence of dissection, stenosis (50% or greater) or occlusion. Segments of non stenotic beaded irregularity of the vertebral arteries. Skeleton: Moderate severe spondylosis of the cervical spine. Multilevel uncovertebral and facet hypertrophy with bony foraminal encroachment. Spinal canal stenosis is greatest at the C6-7 level where it is mild-to-moderate. Grade 1 C7-T1 anterolisthesis. Other neck: Negative. Upper chest: Mild centrilobular emphysema of the lung apices. Review of the MIP images confirms the above findings CTA HEAD FINDINGS Anterior circulation: No significant stenosis, proximal occlusion, aneurysm, or vascular malformation. Non stenotic calcific atherosclerosis of carotid siphons. Posterior circulation: No significant stenosis, proximal occlusion, aneurysm, or vascular malformation. Mild right P2 stenosis. Venous sinuses: As permitted by contrast timing, patent. Anatomic variants: Complete circle-of-Willis. Right fetal PCA. Review of the MIP images confirms the above findings CT Brain Perfusion Findings: CBF (<30%) Volume: 0mL Perfusion (Tmax>6.0s) volume: 0mL Mismatch Volume: 0mL Infarction Location:Negative. IMPRESSION: CT head: 1. No acute intracranial abnormality identified.  ASPECTS is 10. 2. Stable chronic microvascular ischemic changes and volume loss of the brain. 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 CT  perfusion head: Negative CT perfusion head. No acute stroke or ischemic penumbra by RAPID perfusion criteria. CTA neck: 1. Patent carotid and vertebral arteries. No dissection, aneurysm, or high-grade stenosis by NASCET criteria. 2. Predominant fibrofatty plaque of bilateral carotid bifurcations with mild less than 50% proximal ICA stenosis. 3. 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 4. Moderate severe spondylosis of the cervical spine greatest at C6-7. CTA head: Patent anterior and posterior intracranial circulation. No large vessel occlusion, aneurysm, or significant stenosis is identified. These results were called by telephone at the time of interpretation on 09/06/2018 at 1:06 am to Dr. Bayard Males , who verbally acknowledged these results. Electronically Signed   By: Mitzi Hansen M.D.   On: 09/06/2018 01:09   Ct Cerebral Perfusion W Contrast  Result Date: 09/06/2018 CLINICAL DATA:  73 y/o F; seizure-like activity. Focal neural deficit. Code stroke. EXAM: CT HEAD WITHOUT CONTRAST CT ANGIOGRAPHY HEAD AND NECK CT PERFUSION BRAIN TECHNIQUE: Multidetector CT imaging of the head and neck was performed using the standard protocol during bolus administration of intravenous contrast. Multiplanar CT image reconstructions and MIPs were obtained to evaluate the vascular anatomy. Carotid stenosis measurements (when applicable) are obtained utilizing NASCET criteria, using the distal internal carotid diameter as the denominator. Multiphase CT imaging of the brain was performed following IV bolus contrast injection. Subsequent parametric perfusion maps were calculated using RAPID software. CONTRAST:  ISOVUE-370 IOPAMIDOL (ISOVUE-370) INJECTION 76% COMPARISON:  07/19/2018 CT head. 07/09/2018 MRI head. FINDINGS: CT HEAD FINDINGS Brain: No evidence of acute infarction, hemorrhage, hydrocephalus, extra-axial collection or mass lesion/mass effect.  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 Stable chronic microvascular ischemic changes and volume loss of the brain. Vascular: As below. Skull: Normal. Negative for fracture or focal lesion.  Sinuses/Orbits: Small fluid levels within the paranasal sinuses. Normal aeration of mastoid air cells. Orbits are unremarkable. Other: None. ASPECTS (Alberta Stroke Program Early CT Score) - Ganglionic level infarction (caudate, lentiform nuclei, internal capsule, insula, M1-M3 cortex): 7 - Supraganglionic infarction (M4-M6 cortex): 3 Total score (0-10 with 10 being normal): 10 Review of the MIP images confirms the above findings CTA NECK FINDINGS Aortic arch: Standard branching. Imaged portion shows no evidence of aneurysm or dissection. No significant stenosis of the major arch vessel origins. Moderate mixed plaque of the aortic arch. Right carotid system: No evidence of dissection, stenosis (50% or greater) or occlusion. Predominant fibrofatty plaque of the carotid bifurcation with minimal less than 30% proximal ICA stenosis. Left carotid system: No evidence of dissection, stenosis (50% or greater) or occlusion. Predominant fibrofatty plaque of the carotid bifurcation with mild less than 50% proximal ICA stenosis. Mild nonstenotic beaded irregularity of the left mid cervical ICA. Vertebral arteries: Codominant. No evidence of dissection, stenosis (50% or greater) or occlusion. Segments of non stenotic beaded irregularity of the vertebral arteries. Skeleton: Moderate severe spondylosis of the cervical spine. Multilevel uncovertebral and facet hypertrophy with bony foraminal encroachment. Spinal canal stenosis is greatest at the C6-7 level where it is mild-to-moderate. Grade 1 C7-T1 anterolisthesis. Other neck: Negative. Upper chest: Mild centrilobular emphysema of the lung apices. Review of the MIP images confirms the above findings CTA HEAD FINDINGS Anterior  circulation: No significant stenosis, proximal occlusion, aneurysm, or vascular malformation. Non stenotic calcific atherosclerosis of carotid siphons. Posterior circulation: No significant stenosis, proximal occlusion, aneurysm, or vascular malformation. Mild right P2 stenosis. Venous sinuses: As permitted by contrast timing, patent. Anatomic variants: Complete circle-of-Willis. Right fetal PCA. Review of the MIP images confirms the above findings CT Brain Perfusion Findings: CBF (<30%) Volume: 0mL Perfusion (Tmax>6.0s) volume: 0mL Mismatch Volume: 0mL Infarction Location:Negative. IMPRESSION: CT head: 1. No acute intracranial abnormality identified.  ASPECTS is 10. 2. Stable chronic microvascular ischemic changes and volume loss of the brain. 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 CT perfusion head: Negative CT perfusion head. No acute stroke or ischemic penumbra by RAPID perfusion criteria. CTA neck: 1. Patent carotid and vertebral arteries. No dissection, aneurysm, or high-grade stenosis by NASCET criteria. 2. Predominant fibrofatty plaque of bilateral carotid bifurcations with mild less than 50% proximal ICA stenosis. 3. 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 4. Moderate severe spondylosis of the cervical spine greatest at C6-7. CTA head: Patent anterior and posterior intracranial circulation. No large vessel occlusion, aneurysm, or significant stenosis is identified. These results were called by telephone at the time of interpretation on 09/06/2018 at 1:06 am to Dr. Bayard Males , who verbally acknowledged these results. Electronically Signed   By: Mitzi Hansen M.D.   On: 09/06/2018 01:09   Ct Head Code Stroke Wo Contrast  Result Date: 09/06/2018 CLINICAL DATA:  73 y/o F; seizure-like activity. Focal neural deficit. Code stroke. EXAM: CT HEAD WITHOUT CONTRAST CT ANGIOGRAPHY HEAD AND NECK  CT PERFUSION BRAIN TECHNIQUE: Multidetector CT imaging of the head and neck was performed using the standard protocol during bolus administration of intravenous contrast. Multiplanar CT image reconstructions and MIPs were obtained to evaluate the vascular anatomy. Carotid stenosis measurements (when applicable) are obtained utilizing NASCET criteria, using the distal internal carotid diameter as the denominator. Multiphase CT imaging of the brain was performed following IV bolus contrast injection. Subsequent parametric perfusion maps were calculated using RAPID  software. CONTRAST:  ISOVUE-370 IOPAMIDOL (ISOVUE-370) INJECTION 76% COMPARISON:  07/19/2018 CT head. 07/09/2018 MRI head. FINDINGS: CT HEAD FINDINGS Brain: No evidence of acute infarction, hemorrhage, hydrocephalus, extra-axial collection or mass lesion/mass effect. 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 Stable chronic microvascular ischemic changes and volume loss of the brain. Vascular: As below. Skull: Normal. Negative for fracture or focal lesion. Sinuses/Orbits: Small fluid levels within the paranasal sinuses. Normal aeration of mastoid air cells. Orbits are unremarkable. Other: None. ASPECTS (Alberta Stroke Program Early CT Score) - Ganglionic level infarction (caudate, lentiform nuclei, internal capsule, insula, M1-M3 cortex): 7 - Supraganglionic infarction (M4-M6 cortex): 3 Total score (0-10 with 10 being normal): 10 Review of the MIP images confirms the above findings CTA NECK FINDINGS Aortic arch: Standard branching. Imaged portion shows no evidence of aneurysm or dissection. No significant stenosis of the major arch vessel origins. Moderate mixed plaque of the aortic arch. Right carotid system: No evidence of dissection, stenosis (50% or greater) or occlusion. Predominant fibrofatty plaque of the carotid bifurcation with minimal less than 30% proximal ICA stenosis. Left  carotid system: No evidence of dissection, stenosis (50% or greater) or occlusion. Predominant fibrofatty plaque of the carotid bifurcation with mild less than 50% proximal ICA stenosis. Mild nonstenotic beaded irregularity of the left mid cervical ICA. Vertebral arteries: Codominant. No evidence of dissection, stenosis (50% or greater) or occlusion. Segments of non stenotic beaded irregularity of the vertebral arteries. Skeleton: Moderate severe spondylosis of the cervical spine. Multilevel uncovertebral and facet hypertrophy with bony foraminal encroachment. Spinal canal stenosis is greatest at the C6-7 level where it is mild-to-moderate. Grade 1 C7-T1 anterolisthesis. Other neck: Negative. Upper chest: Mild centrilobular emphysema of the lung apices. Review of the MIP images confirms the above findings CTA HEAD FINDINGS Anterior circulation: No significant stenosis, proximal occlusion, aneurysm, or vascular malformation. Non stenotic calcific atherosclerosis of carotid siphons. Posterior circulation: No significant stenosis, proximal occlusion, aneurysm, or vascular malformation. Mild right P2 stenosis. Venous sinuses: As permitted by contrast timing, patent. Anatomic variants: Complete circle-of-Willis. Right fetal PCA. Review of the MIP images confirms the above findings CT Brain Perfusion Findings: CBF (<30%) Volume: 0mL Perfusion (Tmax>6.0s) volume: 0mL Mismatch Volume: 0mL Infarction Location:Negative. IMPRESSION: CT head: 1. No acute intracranial abnormality identified.  ASPECTS is 10. 2. Stable chronic microvascular ischemic changes and volume loss of the brain. 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 CT perfusion head: Negative CT perfusion head. No acute stroke or ischemic penumbra by RAPID perfusion criteria. CTA neck: 1. Patent carotid and vertebral arteries. No dissection, aneurysm, or high-grade stenosis by NASCET criteria. 2. 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 3. 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 4. Moderate severe spondylosis of the cervical spine greatest at C6-7. CTA head: Patent anterior and posterior intracranial circulation. No large vessel occlusion, aneurysm, or significant stenosis is identified. These results were called by telephone at the time of interpretation on 09/06/2018 at 1:06 am to Dr. Bayard Males , who verbally acknowledged these results. Electronically Signed   By: Mitzi Hansen M.D.   On: 09/06/2018 01:09    Pending Labs Unresulted Labs (From admission, onward)    Start     Ordered   09/06/18 0156  Urine culture  ONCE - STAT,   STAT     09/06/18 0155  Vitals/Pain Today's Vitals   09/06/18 1300 09/06/18 1330 09/06/18 1400 09/06/18 1646  BP: (!) 153/70 (!) 147/69 (!) 147/66 (!) 125/52  Pulse: 65 60 (!) 59 (!) 59  Resp: 15 18 15 16   Temp:    97.8 F (36.6 C)  TempSrc:    Oral  SpO2: 98% 98% 98% 98%  Weight:      Height:      PainSc:        Isolation Precautions No active isolations  Medications Medications  atorvastatin (LIPITOR) tablet 20 mg (has no administration in time range)  amLODipine (NORVASC) tablet 5 mg (5 mg Oral Given 09/06/18 0951)  apixaban (ELIQUIS) tablet 5 mg (5 mg Oral Given 09/06/18 0951)  nicotine (NICODERM CQ - dosed in mg/24 hours) patch 21 mg (21 mg Transdermal Patch Applied 09/06/18 0953)  senna-docusate (Senokot-S) tablet 1 tablet (has no administration in time range)  acetaminophen (TYLENOL) tablet 650 mg (has no administration in time range)    Or  acetaminophen (TYLENOL) suppository 650 mg (has no administration in time range)  ondansetron (ZOFRAN) tablet 4 mg (has no administration in time range)    Or  ondansetron (ZOFRAN) injection 4 mg (has no administration in time range)  docusate sodium (COLACE) capsule 100  mg (100 mg Oral Given 09/06/18 0951)  cefTRIAXone (ROCEPHIN) 1 g in sodium chloride 0.9 % 100 mL IVPB (has no administration in time range)  iopamidol (ISOVUE-370) 76 % injection 100 mL (100 mLs Intravenous Contrast Given 09/06/18 0025)  levETIRAcetam (KEPPRA) IVPB 1000 mg/100 mL premix (0 mg Intravenous Stopped 09/06/18 0259)  cefTRIAXone (ROCEPHIN) 1 g in sodium chloride 0.9 % 100 mL IVPB ( Intravenous Stopped 09/06/18 0240)    Mobility walks with person assist

## 2018-09-06 NOTE — ED Notes (Signed)
Pt transported to EEG. Pt refusing treatment but pt is not in her right mind. RN called pts daughter to update on treatment plan and daughter verified to continue with testing. EEG notified and MRI verbalized they had already talked to daughter about medical hx and obtained consent

## 2018-09-06 NOTE — Progress Notes (Signed)
I went to see the pt in ER- she was gone for EEG   Will see her tomorrow. Neuro had seen her for seizures and on meds,  Rocephin and ur cx for UTI

## 2018-09-06 NOTE — Plan of Care (Signed)
Pt unable to give accurate  responses Problem: Education: Goal: Knowledge of General Education information will improve Description Including pain rating scale, medication(s)/side effects and non-pharmacologic comfort measures Outcome: Progressing   Problem: Activity: Goal: Risk for activity intolerance will decrease Outcome: Progressing   Problem: Elimination: Goal: Will not experience complications related to bowel motility Outcome: Progressing   Problem: Safety: Goal: Ability to remain free from injury will improve Outcome: Progressing   Problem: Skin Integrity: Goal: Risk for impaired skin integrity will decrease Outcome: Progressing   Problem: Education: Goal: Knowledge of secondary prevention will improve Outcome: Progressing

## 2018-09-06 NOTE — Procedures (Signed)
ELECTROENCEPHALOGRAM REPORT   Patient: Destiny Mack       Room #: 116A-AA EEG No. ID: [id number] Age: 73 y.o.        Sex: female Referring Physician: Elisabeth Pigeon Report Date:  09/06/2018        Interpreting Physician: Thana Farr  History: [name] is an 73 y.o. female with altered mental status  Medications:  Norvasc, Eliquis, Lipitor, Rocephin, Colace  Conditions of Recording:  This is a 21 channel routine scalp EEG performed with bipolar and monopolar montages arranged in accordance to the international 10/20 system of electrode placement. One channel was dedicated to EKG recording.  The patient is in the awake, drowsy and asleep states.  Description:  The waking background activity consists of a low voltage, symmetrical, fairly well organized, 7-8 Hz theta activity, seen from the parieto-occipital and posterior temporal regions.  Low voltage fast activity, poorly organized, is seen anteriorly and is at times superimposed on more posterior regions.  A mixture of theta and alpha rhythms are seen from the central and temporal regions. The patient drowses with slowing to irregular, low voltage theta and beta activity.   The patient goes in to a light sleep with symmetrical sleep spindles, vertex central sharp transients and irregular slow activity.   Hyperventilation and intermittent photic stimulation were not performed.  IMPRESSION: This is an abnormal EEG secondary to posterior background slowing.  This finding may be seen with a diffuse gray matter disturbance that is etiologically nonspecific, but may include a dementia, among other possibilities.  No epileptiform activity is noted.     Thana Farr, MD Neurology [phone] 09/06/2018, 6:44 PM

## 2018-09-06 NOTE — ED Notes (Signed)
Pt's family contacted over phone to find out date/time LKW. Daughter Rae Roam states pt experienced "seizure-like" activity about 1 & 1/2 hours ago. States pt has been sleeping all day on & off and that this is not normal for her. Pt states she is baseline on weakness from previous stroke but is confused on what day it is. A&Ox3. IV team at bedside.

## 2018-09-06 NOTE — ED Notes (Signed)
Pt back from CT with this RN and NT Zack.

## 2018-09-06 NOTE — ED Notes (Signed)
EDP R at bedside to obtain 18g access for CT.

## 2018-09-06 NOTE — ED Notes (Signed)
Blue top sent to lab. 

## 2018-09-06 NOTE — ED Notes (Signed)
Pharm contacted by phone for Keppra.

## 2018-09-06 NOTE — Progress Notes (Signed)
Routine EEG completed, results pending. 

## 2018-09-06 NOTE — ED Notes (Signed)
Daughter updated on assigned bed.

## 2018-09-06 NOTE — ED Notes (Signed)
Pt's daughter called wanting an update; updated as pt gave permission earlier to share medical info with daughter.

## 2018-09-06 NOTE — ED Notes (Signed)
Pt wakes to voice again. Will answer questions but still confused (A&Ox3--year/month wrong). Remains alert. Pt verbalizes wanting more grape juice. Tolerates water well. Given more grape juice. L pupil 3 while R pupil 2 noted.

## 2018-09-06 NOTE — ED Notes (Signed)
Seizure pads in place

## 2018-09-06 NOTE — ED Notes (Signed)
Lab called to add on urine culture to sample.

## 2018-09-06 NOTE — ED Notes (Signed)
Pt suddenly acting very lethargic; opening eyes to painful stimuli; CBG 108; speech slurred; EDP Brown notified in person.

## 2018-09-06 NOTE — ED Notes (Signed)
Pt given food/drink okay per EDP Brown.

## 2018-09-06 NOTE — ED Provider Notes (Signed)
Angiocath insertion Performed by: Merrily Brittle  Consent: Verbal consent obtained. Risks and benefits: risks, benefits and alternatives were discussed Time out: Immediately prior to procedure a "time out" was called to verify the correct patient, procedure, equipment, support staff and site/side marked as required.  Preparation: Patient was prepped and draped in the usual sterile fashion.  Vein Location: left upper extremity  Ultrasound Guided  Gauge: 18  Normal blood return and flush without difficulty Patient tolerance: Patient tolerated the procedure well with no immediate complications.      Merrily Brittle, MD 09/06/18 [phone]

## 2018-09-06 NOTE — ED Notes (Signed)
Pt is not oriented to perform MRI screening. MRI notified and reports they will call family.

## 2018-09-06 NOTE — ED Notes (Signed)
MRI verbalized pt could be sent to the floor at this time and they would take patient for MRI at a later time.

## 2018-09-07 LAB — URINE CULTURE: Culture: 10000 — AB

## 2018-09-07 MED ORDER — GUAIFENESIN-DM 100-10 MG/5ML PO SYRP
5.0000 mL | ORAL_SOLUTION | ORAL | Status: DC | PRN
  Administered 2018-09-07: 03:00:00 5 mL via ORAL
  Filled 2018-09-07 (×2): qty 5

## 2018-09-07 MED ORDER — LEVETIRACETAM 500 MG PO TABS
500.0000 mg | ORAL_TABLET | Freq: Two times a day (BID) | ORAL | Status: DC
  Administered 2018-09-07 – 2018-09-09 (×4): 500 mg via ORAL
  Filled 2018-09-07 (×5): qty 1

## 2018-09-07 NOTE — Progress Notes (Signed)
Chaplain responded to an OR for AD. Pt is not cognizant enough to complete. Chaplain prayed for the patient.    09/07/18 1500  Clinical Encounter Type  Visited With Patient  Visit Type Initial  Referral From Nurse  Spiritual Encounters  Spiritual Needs Prayer

## 2018-09-07 NOTE — Progress Notes (Signed)
[employer] - [place] [facility]   PATIENT NAME: Destiny Mack    MR#:  [id number]  DATE OF BIRTH:  Aug 24, 1946  SUBJECTIVE:  CHIEF COMPLAINT:   Chief Complaint  Patient presents with  . Seizures  . Code Stroke   Brought with worsening mental status and possible seizure. Completely alert and oriented today.  Continue to have both lower extremity weakness.  REVIEW OF SYSTEMS:  CONSTITUTIONAL: No fever, fatigue or weakness.  EYES: No blurred or double vision.  EARS, NOSE, AND THROAT: No tinnitus or ear pain.  RESPIRATORY: No cough, shortness of breath, wheezing or hemoptysis.  CARDIOVASCULAR: No chest pain, orthopnea, edema.  GASTROINTESTINAL: No nausea, vomiting, diarrhea or abdominal pain.  GENITOURINARY: No dysuria, hematuria.  ENDOCRINE: No polyuria, nocturia,  HEMATOLOGY: No anemia, easy bruising or bleeding SKIN: No rash or lesion. MUSCULOSKELETAL: No joint pain or arthritis.   NEUROLOGIC: No tingling, numbness, both lower extremity weakness.  PSYCHIATRY: No anxiety or depression.   ROS  DRUG ALLERGIES:  No Known Allergies  VITALS:  Blood pressure 137/60, pulse 79, temperature 98.6 F (37 C), temperature source Oral, resp. rate 20, height 5\' 4"  (1.626 m), weight 57.1 kg, SpO2 97 %.  PHYSICAL EXAMINATION:  GENERAL:  73 y.o.-year-old patient lying in the bed with no acute distress.  EYES: Pupils equal, round, reactive to light and accommodation. No scleral icterus. Extraocular muscles intact.  HEENT: Head atraumatic, normocephalic. Oropharynx and nasopharynx clear.  NECK:  Supple, no jugular venous distention. No thyroid enlargement, no tenderness.  LUNGS: Normal breath sounds bilaterally, no wheezing, rales,rhonchi or crepitation. No use of accessory muscles of respiration.  CARDIOVASCULAR: S1, S2 normal. No murmurs, rubs, or gallops.  ABDOMEN: Soft, nontender, nondistended. Bowel sounds present. No organomegaly or mass.  EXTREMITIES: No pedal  edema, cyanosis, or clubbing.  NEUROLOGIC: Cranial nerves II through XII are intact. Muscle strength 5/5 in upper and 1 out of 5 in both lower extremities. Sensation intact. Gait not checked.  PSYCHIATRIC: The patient is alert and oriented x 3.  SKIN: No obvious rash, lesion, or ulcer.   Physical Exam LABORATORY PANEL:   CBC Recent Labs  Lab 09/06/18 0020  WBC 6.6  HGB 10.8*  HCT 34.1*  PLT 181   ------------------------------------------------------------------------------------------------------------------  Chemistries  Recent Labs  Lab 09/06/18 0020  NA 144  K 3.5  CL 118*  CO2 20*  GLUCOSE 87  BUN 13  CREATININE 1.32*  CALCIUM 8.2*  AST 17  ALT 10  ALKPHOS 67  BILITOT 0.3   ------------------------------------------------------------------------------------------------------------------  Cardiac Enzymes Recent Labs  Lab 09/04/18 1517 09/06/18 0020  TROPONINI <0.03 <0.03   ------------------------------------------------------------------------------------------------------------------  RADIOLOGY:  Ct Angio Head W Or Wo Contrast  Result Date: 09/06/2018 CLINICAL DATA:  73 y/o F; seizure-like activity. Focal neural deficit. Code stroke. EXAM: CT HEAD WITHOUT CONTRAST CT ANGIOGRAPHY HEAD AND NECK CT PERFUSION BRAIN TECHNIQUE: Multidetector CT imaging of the head and neck was performed using the standard protocol during bolus administration of intravenous contrast. Multiplanar CT image reconstructions and MIPs were obtained to evaluate the vascular anatomy. Carotid stenosis measurements (when applicable) are obtained utilizing NASCET criteria, using the distal internal carotid diameter as the denominator. Multiphase CT imaging of the brain was performed following IV bolus contrast injection. Subsequent parametric perfusion maps were calculated using RAPID software. CONTRAST:  ISOVUE-370 IOPAMIDOL (ISOVUE-370) INJECTION 76% COMPARISON:  07/19/2018 CT head.  07/09/2018 MRI head. FINDINGS: CT HEAD FINDINGS Brain: No evidence of acute infarction, hemorrhage, hydrocephalus, extra-axial collection  or mass lesion/mass effect. 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 Stable chronic microvascular ischemic changes and volume loss of the brain. Vascular: As below. Skull: Normal. Negative for fracture or focal lesion. Sinuses/Orbits: Small fluid levels within the paranasal sinuses. Normal aeration of mastoid air cells. Orbits are unremarkable. Other: None. ASPECTS (Alberta Stroke Program Early CT Score) - Ganglionic level infarction (caudate, lentiform nuclei, internal capsule, insula, M1-M3 cortex): 7 - Supraganglionic infarction (M4-M6 cortex): 3 Total score (0-10 with 10 being normal): 10 Review of the MIP images confirms the above findings CTA NECK FINDINGS Aortic arch: Standard branching. Imaged portion shows no evidence of aneurysm or dissection. No significant stenosis of the major arch vessel origins. Moderate mixed plaque of the aortic arch. Right carotid system: No evidence of dissection, stenosis (50% or greater) or occlusion. Predominant fibrofatty plaque of the carotid bifurcation with minimal less than 30% proximal ICA stenosis. Left carotid system: No evidence of dissection, stenosis (50% or greater) or occlusion. Predominant fibrofatty plaque of the carotid bifurcation with mild less than 50% proximal ICA stenosis. Mild nonstenotic beaded irregularity of the left mid cervical ICA. Vertebral arteries: Codominant. No evidence of dissection, stenosis (50% or greater) or occlusion. Segments of non stenotic beaded irregularity of the vertebral arteries. Skeleton: Moderate severe spondylosis of the cervical spine. Multilevel uncovertebral and facet hypertrophy with bony foraminal encroachment. Spinal canal stenosis is greatest at the C6-7 level where it is mild-to-moderate. Grade 1 C7-T1 anterolisthesis.  Other neck: Negative. Upper chest: Mild centrilobular emphysema of the lung apices. Review of the MIP images confirms the above findings CTA HEAD FINDINGS Anterior circulation: No significant stenosis, proximal occlusion, aneurysm, or vascular malformation. Non stenotic calcific atherosclerosis of carotid siphons. Posterior circulation: No significant stenosis, proximal occlusion, aneurysm, or vascular malformation. Mild right P2 stenosis. Venous sinuses: As permitted by contrast timing, patent. Anatomic variants: Complete circle-of-Willis. Right fetal PCA. Review of the MIP images confirms the above findings CT Brain Perfusion Findings: CBF (<30%) Volume: 0mL Perfusion (Tmax>6.0s) volume: 0mL Mismatch Volume: 0mL Infarction Location:Negative. IMPRESSION: CT head: 1. No acute intracranial abnormality identified.  ASPECTS is 10. 2. Stable chronic microvascular ischemic changes and volume loss of the brain. 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 CT perfusion head: Negative CT perfusion head. No acute stroke or ischemic penumbra by RAPID perfusion criteria. CTA neck: 1. Patent carotid and vertebral arteries. No dissection, aneurysm, or high-grade stenosis by NASCET criteria. 2. Predominant fibrofatty plaque of bilateral carotid bifurcations with mild less than 50% proximal ICA stenosis. 3. 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 4. Moderate severe spondylosis of the cervical spine greatest at C6-7. CTA head: Patent anterior and posterior intracranial circulation. No large vessel occlusion, aneurysm, or significant stenosis is identified. These results were called by telephone at the time of interpretation on 09/06/2018 at 1:06 am to Dr. Bayard Males , who verbally acknowledged these results. Electronically Signed   By: Mitzi Hansen M.D.   On: 09/06/2018 01:09   Ct Angio Neck W Or Wo  Contrast  Result Date: 09/06/2018 CLINICAL DATA:  73 y/o F; seizure-like activity. Focal neural deficit. Code stroke. EXAM: CT HEAD WITHOUT CONTRAST CT ANGIOGRAPHY HEAD AND NECK CT PERFUSION BRAIN TECHNIQUE: Multidetector CT imaging of the head and neck was performed using the standard protocol during bolus administration of intravenous contrast. Multiplanar CT image reconstructions and MIPs were obtained to evaluate  the vascular anatomy. Carotid stenosis measurements (when applicable) are obtained utilizing NASCET criteria, using the distal internal carotid diameter as the denominator. Multiphase CT imaging of the brain was performed following IV bolus contrast injection. Subsequent parametric perfusion maps were calculated using RAPID software. CONTRAST:  ISOVUE-370 IOPAMIDOL (ISOVUE-370) INJECTION 76% COMPARISON:  07/19/2018 CT head. 07/09/2018 MRI head. FINDINGS: CT HEAD FINDINGS Brain: No evidence of acute infarction, hemorrhage, hydrocephalus, extra-axial collection or mass lesion/mass effect. 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 Stable chronic microvascular ischemic changes and volume loss of the brain. Vascular: As below. Skull: Normal. Negative for fracture or focal lesion. Sinuses/Orbits: Small fluid levels within the paranasal sinuses. Normal aeration of mastoid air cells. Orbits are unremarkable. Other: None. ASPECTS (Alberta Stroke Program Early CT Score) - Ganglionic level infarction (caudate, lentiform nuclei, internal capsule, insula, M1-M3 cortex): 7 - Supraganglionic infarction (M4-M6 cortex): 3 Total score (0-10 with 10 being normal): 10 Review of the MIP images confirms the above findings CTA NECK FINDINGS Aortic arch: Standard branching. Imaged portion shows no evidence of aneurysm or dissection. No significant stenosis of the major arch vessel origins. Moderate mixed plaque of the aortic arch. Right carotid system:  No evidence of dissection, stenosis (50% or greater) or occlusion. Predominant fibrofatty plaque of the carotid bifurcation with minimal less than 30% proximal ICA stenosis. Left carotid system: No evidence of dissection, stenosis (50% or greater) or occlusion. Predominant fibrofatty plaque of the carotid bifurcation with mild less than 50% proximal ICA stenosis. Mild nonstenotic beaded irregularity of the left mid cervical ICA. Vertebral arteries: Codominant. No evidence of dissection, stenosis (50% or greater) or occlusion. Segments of non stenotic beaded irregularity of the vertebral arteries. Skeleton: Moderate severe spondylosis of the cervical spine. Multilevel uncovertebral and facet hypertrophy with bony foraminal encroachment. Spinal canal stenosis is greatest at the C6-7 level where it is mild-to-moderate. Grade 1 C7-T1 anterolisthesis. Other neck: Negative. Upper chest: Mild centrilobular emphysema of the lung apices. Review of the MIP images confirms the above findings CTA HEAD FINDINGS Anterior circulation: No significant stenosis, proximal occlusion, aneurysm, or vascular malformation. Non stenotic calcific atherosclerosis of carotid siphons. Posterior circulation: No significant stenosis, proximal occlusion, aneurysm, or vascular malformation. Mild right P2 stenosis. Venous sinuses: As permitted by contrast timing, patent. Anatomic variants: Complete circle-of-Willis. Right fetal PCA. Review of the MIP images confirms the above findings CT Brain Perfusion Findings: CBF (<30%) Volume: 0mL Perfusion (Tmax>6.0s) volume: 0mL Mismatch Volume: 0mL Infarction Location:Negative. IMPRESSION: CT head: 1. No acute intracranial abnormality identified.  ASPECTS is 10. 2. Stable chronic microvascular ischemic changes and volume loss of the brain. 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 CT perfusion head: Negative CT perfusion head. No acute stroke or ischemic penumbra  by RAPID perfusion criteria. CTA neck: 1. Patent carotid and vertebral arteries. No dissection, aneurysm, or high-grade stenosis by NASCET criteria. 2. Predominant fibrofatty plaque of bilateral carotid bifurcations with mild less than 50% proximal ICA stenosis. 3. 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 4. Moderate severe spondylosis of the cervical spine greatest at C6-7. CTA head: Patent anterior and posterior intracranial circulation. No large vessel occlusion, aneurysm, or significant stenosis is identified. These results were called by telephone at the time of interpretation on 09/06/2018 at 1:06 am to Dr. Bayard Males , who verbally acknowledged these results. Electronically Signed   By: Buzzy Han.D.  On: 09/06/2018 01:09   Mr Brain Wo Contrast  Result Date: 09/06/2018 CLINICAL DATA:  Initial evaluation for acute syncope, history of atrial fibrillation. Evaluate for stroke. EXAM: MRI HEAD WITHOUT CONTRAST TECHNIQUE: Multiplanar, multiecho pulse sequences of the brain and surrounding structures were obtained without intravenous contrast. COMPARISON:  Comparison made with prior CTs from earlier the same day. FINDINGS: Brain: Diffuse prominence of the CSF containing spaces compatible with generalized age-related cerebral atrophy. Patchy and confluent T2/FLAIR hyperintensity within the periventricular deep white matter both cerebral hemispheres consistent with chronic microvascular ischemic disease, moderate to advanced in nature. Small chronic left occipital lobe infarct noted. Additional small chronic cortical infarct noted at the anterior left frontal lobe. Few tiny remote left cerebellar infarcts. No abnormal foci of restricted diffusion to suggest acute or subacute ischemia. Gray-white matter differentiation maintained. No evidence for acute or chronic intracranial hemorrhage. No mass lesion, midline shift or mass effect.  Ventricular prominence related to global parenchymal volume loss without hydrocephalus. No extra-axial fluid collection. Pituitary gland within normal limits. Vascular: Major intracranial vascular flow voids maintained. Skull and upper cervical spine: Craniocervical junction within normal limits. Multilevel degenerative spondylolysis noted within the upper cervical spine with resultant mild to moderate diffuse spinal stenosis. Bone marrow signal intensity within normal limits. No scalp soft tissue abnormality. Sinuses/Orbits: Globes and orbital soft tissues within normal limits. Mild scattered mucosal thickening throughout the paranasal sinuses. No air-fluid level to suggest acute sinusitis. Trace left mastoid effusion noted, of doubtful significance. Other: None. IMPRESSION: 1. No acute intracranial abnormality. 2. 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 Electronically Signed   By: Rise MuBenjamin  McClintock M.D.   On: 09/06/2018 22:12   Ct Cerebral Perfusion W Contrast  Result Date: 09/06/2018 CLINICAL DATA:  73 y/o F; seizure-like activity. Focal neural deficit. Code stroke. EXAM: CT HEAD WITHOUT CONTRAST CT ANGIOGRAPHY HEAD AND NECK CT PERFUSION BRAIN TECHNIQUE: Multidetector CT imaging of the head and neck was performed using the standard protocol during bolus administration of intravenous contrast. Multiplanar CT image reconstructions and MIPs were obtained to evaluate the vascular anatomy. Carotid stenosis measurements (when applicable) are obtained utilizing NASCET criteria, using the distal internal carotid diameter as the denominator. Multiphase CT imaging of the brain was performed following IV bolus contrast injection. Subsequent parametric perfusion maps were calculated using RAPID software. CONTRAST:  100mL ISOVUE-370 IOPAMIDOL (ISOVUE-370) INJECTION 76% COMPARISON:  07/19/2018 CT head.  07/09/2018 MRI head. FINDINGS: CT HEAD FINDINGS Brain: No evidence of acute infarction, hemorrhage, hydrocephalus, extra-axial collection or mass lesion/mass effect. 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 Stable chronic microvascular ischemic changes and volume loss of the brain. Vascular: As below. Skull: Normal. Negative for fracture or focal lesion. Sinuses/Orbits: Small fluid levels within the paranasal sinuses. Normal aeration of mastoid air cells. Orbits are unremarkable. Other: None. ASPECTS (Alberta Stroke Program Early CT Score) - Ganglionic level infarction (caudate, lentiform nuclei, internal capsule, insula, M1-M3 cortex): 7 - Supraganglionic infarction (M4-M6 cortex): 3 Total score (0-10 with 10 being normal): 10 Review of the MIP images confirms the above findings CTA NECK FINDINGS Aortic arch: Standard branching. Imaged portion shows no evidence of aneurysm or dissection. No significant stenosis of the major arch vessel origins. Moderate mixed plaque of the aortic arch. Right carotid system: No evidence of dissection, stenosis (50% or greater) or occlusion. Predominant fibrofatty plaque of the carotid bifurcation with minimal less  than 30% proximal ICA stenosis. Left carotid system: No evidence of dissection, stenosis (50% or greater) or occlusion. Predominant fibrofatty plaque of the carotid bifurcation with mild less than 50% proximal ICA stenosis. Mild nonstenotic beaded irregularity of the left mid cervical ICA. Vertebral arteries: Codominant. No evidence of dissection, stenosis (50% or greater) or occlusion. Segments of non stenotic beaded irregularity of the vertebral arteries. Skeleton: Moderate severe spondylosis of the cervical spine. Multilevel uncovertebral and facet hypertrophy with bony foraminal encroachment. Spinal canal stenosis is greatest at the C6-7 level where it is mild-to-moderate. Grade 1 C7-T1 anterolisthesis.  Other neck: Negative. Upper chest: Mild centrilobular emphysema of the lung apices. Review of the MIP images confirms the above findings CTA HEAD FINDINGS Anterior circulation: No significant stenosis, proximal occlusion, aneurysm, or vascular malformation. Non stenotic calcific atherosclerosis of carotid siphons. Posterior circulation: No significant stenosis, proximal occlusion, aneurysm, or vascular malformation. Mild right P2 stenosis. Venous sinuses: As permitted by contrast timing, patent. Anatomic variants: Complete circle-of-Willis. Right fetal PCA. Review of the MIP images confirms the above findings CT Brain Perfusion Findings: CBF (<30%) Volume: 0mL Perfusion (Tmax>6.0s) volume: 0mL Mismatch Volume: 0mL Infarction Location:Negative. IMPRESSION: CT head: 1. No acute intracranial abnormality identified.  ASPECTS is 10. 2. Stable chronic microvascular ischemic changes and volume loss of the brain. 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 CT perfusion head: Negative CT perfusion head. No acute stroke or ischemic penumbra by RAPID perfusion criteria. CTA neck: 1. Patent carotid and vertebral arteries. No dissection, aneurysm, or high-grade stenosis by NASCET criteria. 2. Predominant fibrofatty plaque of bilateral carotid bifurcations with mild less than 50% proximal ICA stenosis. 3. 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 4. Moderate severe spondylosis of the cervical spine greatest at C6-7. CTA head: Patent anterior and posterior intracranial circulation. No large vessel occlusion, aneurysm, or significant stenosis is identified. These results were called by telephone at the time of interpretation on 09/06/2018 at 1:06 am to Dr. Bayard MalesANDOLPH BROWN , who verbally acknowledged these results. Electronically Signed   By: Mitzi HansenLance  Furusawa-Stratton M.D.   On: 09/06/2018 01:09   Ct Head Code Stroke Wo  Contrast  Result Date: 09/06/2018 CLINICAL DATA:  73 y/o F; seizure-like activity. Focal neural deficit. Code stroke. EXAM: CT HEAD WITHOUT CONTRAST CT ANGIOGRAPHY HEAD AND NECK CT PERFUSION BRAIN TECHNIQUE: Multidetector CT imaging of the head and neck was performed using the standard protocol during bolus administration of intravenous contrast. Multiplanar CT image reconstructions and MIPs were obtained to evaluate the vascular anatomy. Carotid stenosis measurements (when applicable) are obtained utilizing NASCET criteria, using the distal internal carotid diameter as the denominator. Multiphase CT imaging of the brain was performed following IV bolus contrast injection. Subsequent parametric perfusion maps were calculated using RAPID software. CONTRAST:  100mL ISOVUE-370 IOPAMIDOL (ISOVUE-370) INJECTION 76% COMPARISON:  07/19/2018 CT head. 07/09/2018 MRI head. FINDINGS: CT HEAD FINDINGS Brain: No evidence of acute infarction, hemorrhage, hydrocephalus, extra-axial collection or mass lesion/mass effect. 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 Stable chronic microvascular ischemic changes and volume loss of the brain. Vascular: As below. Skull: Normal. Negative for fracture or focal lesion. Sinuses/Orbits: Small fluid levels within the paranasal sinuses. Normal aeration of mastoid air cells. Orbits are unremarkable. Other: None. ASPECTS [facility](Alberta Stroke Program Early CT Score) - Ganglionic level infarction (caudate, lentiform nuclei, internal capsule, insula, M1-M3 cortex): 7 - Supraganglionic infarction (M4-M6 cortex):  3 Total score (0-10 with 10 being normal): 10 Review of the MIP images confirms the above findings CTA NECK FINDINGS Aortic arch: Standard branching. Imaged portion shows no evidence of aneurysm or dissection. No significant stenosis of the major arch vessel origins. Moderate mixed plaque of the aortic arch. Right carotid system:  No evidence of dissection, stenosis (50% or greater) or occlusion. Predominant fibrofatty plaque of the carotid bifurcation with minimal less than 30% proximal ICA stenosis. Left carotid system: No evidence of dissection, stenosis (50% or greater) or occlusion. Predominant fibrofatty plaque of the carotid bifurcation with mild less than 50% proximal ICA stenosis. Mild nonstenotic beaded irregularity of the left mid cervical ICA. Vertebral arteries: Codominant. No evidence of dissection, stenosis (50% or greater) or occlusion. Segments of non stenotic beaded irregularity of the vertebral arteries. Skeleton: Moderate severe spondylosis of the cervical spine. Multilevel uncovertebral and facet hypertrophy with bony foraminal encroachment. Spinal canal stenosis is greatest at the C6-7 level where it is mild-to-moderate. Grade 1 C7-T1 anterolisthesis. Other neck: Negative. Upper chest: Mild centrilobular emphysema of the lung apices. Review of the MIP images confirms the above findings CTA HEAD FINDINGS Anterior circulation: No significant stenosis, proximal occlusion, aneurysm, or vascular malformation. Non stenotic calcific atherosclerosis of carotid siphons. Posterior circulation: No significant stenosis, proximal occlusion, aneurysm, or vascular malformation. Mild right P2 stenosis. Venous sinuses: As permitted by contrast timing, patent. Anatomic variants: Complete circle-of-Willis. Right fetal PCA. Review of the MIP images confirms the above findings CT Brain Perfusion Findings: CBF (<30%) Volume: 31mL Perfusion (Tmax>6.0s) volume: 27mL Mismatch Volume: 9mL Infarction Location:Negative. IMPRESSION: CT head: 1. No acute intracranial abnormality identified.  ASPECTS is 10. 2. Stable chronic microvascular ischemic changes and volume loss of the brain. 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 CT perfusion head: Negative CT perfusion head. No acute stroke or ischemic penumbra  by RAPID perfusion criteria. CTA neck: 1. Patent carotid and vertebral arteries. No dissection, aneurysm, or high-grade stenosis by NASCET criteria. 2. Predominant fibrofatty plaque of bilateral carotid bifurcations with mild less than 50% proximal ICA stenosis. 3. 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 4. Moderate severe spondylosis of the cervical spine greatest at C6-7. CTA head: Patent anterior and posterior intracranial circulation. No large vessel occlusion, aneurysm, or significant stenosis is identified. These results were called by telephone at the time of interpretation on 09/06/2018 at 1:06 am to Dr. Bayard Males , who verbally acknowledged these results. Electronically Signed   By: Mitzi Hansen M.D.   On: 09/06/2018 01:09    ASSESSMENT AND PLAN:   Active Problems:   Seizure [facility])  This is a 73 year old female admitted for seizure.  * Seizure: New onset; loaded with Keppra in the emergency department.  Continue to monitor for seizure activity.  Consult neurology. EEG done which did not show any epileptic activities, but due to her recurrent presentation with similar symptoms neurology suggested to continue Keppra. MRI negative. *UTI-continue Rocephin and follow urine culture. *  Hypertension: Controlled; continue amlodipine *  Atrial fibrillation: Rate controlled; continue Eliquis *  History of stroke: The patient has a history of falls and likely has gait disturbance secondary to her remote strokes.  Continue secondary prevention by continuing statin therapy and control of atrial fibrillation and hypertension *  DVT prophylaxis: As above *  GI prophylaxis: None   All the records are reviewed and case discussed with Care Management/Social Workerr. Management plans discussed with  the patient, family and they are in agreement.  CODE STATUS: FUll.  TOTAL TIME TAKING CARE OF THIS PATIENT: 35 minutes.      POSSIBLE D/C IN 1-2 DAYS, DEPENDING ON CLINICAL CONDITION.   Altamese Dilling M.D on 09/07/2018   Between 7am to 6pm - Pager - [phone]  After 6pm go to [email or web address] - password [email or web address]  [facility]  Office  [phone]  CC: Primary care physician; Oswaldo Conroy, MD  Note: This dictation was prepared with Dragon dictation along with smaller phrase technology. Any transcriptional errors that result from this process are unintentional.

## 2018-09-07 NOTE — Progress Notes (Signed)
Subjective: Patient improved today.  Not as weak.  More alert.    Objective: Current vital signs: BP 131/80 (BP Location: Right Arm)   Pulse 65   Temp 98.1 F (36.7 C) (Oral)   Resp 20   Ht 5\' 4"  (1.626 m)   Wt 57.1 kg   LMP  (LMP Unknown) Comment: uta  SpO2 96%   BMI 21.61 kg/m  Vital signs in last 24 hours: Temp:  [97.8 F (36.6 C)-99.1 F (37.3 C)] 98.1 F (36.7 C) (01/11 0401) Pulse Rate:  [59-65] 65 (01/11 0401) Resp:  [15-23] 20 (01/10 1724) BP: (123-153)/(50-80) 131/80 (01/11 0401) SpO2:  [95 %-100 %] 96 % (01/11 0401) Weight:  [57.1 kg] 57.1 kg (01/11 0500)  Intake/Output from previous day: 01/10 0701 - 01/11 0700 In: 218.6 [I.V.:18.6; IV Piggyback:200] Out: 250 [Urine:250] Intake/Output this shift: No intake/output data recorded. Nutritional status:  Diet Order            Diet Heart Room service appropriate? Yes; Fluid consistency: Thin  Diet effective now              Neurologic Exam: Mental Status: Alert.  Speech fluent without evidence of aphasia.  Able to follow 3 step commands without difficulty. Cranial Nerves: II: Discs flat bilaterally; Visual fields grossly normal, pupils equal, round, reactive to light and accommodation III,IV, VI: ptosis not present, extra-ocular motions intact bilaterally V,VII: smile symmetric, facial light touch sensation normal bilaterally VIII: hearing normal bilaterally IX,X: gag reflex present XI: bilateral shoulder shrug XII: midline tongue extension Motor: Lifts both upper extremities off the bed, left greater than right.  Able to bend both legs at the knees but unable to lift off the bed.      Lab Results: Basic Metabolic Panel: Recent Labs  Lab 09/04/18 1517 09/06/18 0020  NA 142 144  K 3.6 3.5  CL 116* 118*  CO2 18* 20*  GLUCOSE 92 87  BUN 11 13  CREATININE 1.39* 1.32*  CALCIUM 8.3* 8.2*    Liver Function Tests: Recent Labs  Lab 09/06/18 0020  AST 17  ALT 10  ALKPHOS 67  BILITOT 0.3   PROT 6.2*  ALBUMIN 3.0*   No results for input(s): LIPASE, AMYLASE in the last 168 hours. No results for input(s): AMMONIA in the last 168 hours.  CBC: Recent Labs  Lab 09/04/18 1517 09/06/18 0020  WBC 6.2 6.6  NEUTROABS 4.2 4.4  HGB 11.3* 10.8*  HCT 35.5* 34.1*  MCV 95.2 95.8  PLT 190 181    Cardiac Enzymes: Recent Labs  Lab 09/04/18 1517 09/06/18 0020  TROPONINI <0.03 <0.03    Lipid Panel: No results for input(s): CHOL, TRIG, HDL, CHOLHDL, VLDL, LDLCALC in the last 168 hours.  CBG: Recent Labs  Lab 09/05/18 2351 09/06/18 0410  GLUCAP 77 108*    Microbiology: Results for orders placed or performed during the hospital encounter of 09/05/18  Urine culture     Status: Abnormal   Collection Time: 09/06/18 12:53 AM  Result Value Ref Range Status   Specimen Description URINE, RANDOM  Final   Special Requests   Final    NONE Performed at [facility], [address] [place], [place] [address]    Culture 10,000 COLONIES/mL YEAST (A)  Final   Report Status 09/07/2018 FINAL  Final    Coagulation Studies: Recent Labs    09/06/18 0053  LABPROT 20.7*  INR 1.80    Imaging: Ct Angio Head W Or Wo Contrast  Result  Date: 09/06/2018 CLINICAL DATA:  73 y/o F; seizure-like activity. Focal neural deficit. Code stroke. EXAM: CT HEAD WITHOUT CONTRAST CT ANGIOGRAPHY HEAD AND NECK CT PERFUSION BRAIN TECHNIQUE: Multidetector CT imaging of the head and neck was performed using the standard protocol during bolus administration of intravenous contrast. Multiplanar CT image reconstructions and MIPs were obtained to evaluate the vascular anatomy. Carotid stenosis measurements (when applicable) are obtained utilizing NASCET criteria, using the distal internal carotid diameter as the denominator. Multiphase CT imaging of the brain was performed following IV bolus contrast injection. Subsequent parametric perfusion maps were calculated using RAPID software. CONTRAST:   ISOVUE-370 IOPAMIDOL (ISOVUE-370) INJECTION 76% COMPARISON:  07/19/2018 CT head. 07/09/2018 MRI head. FINDINGS: CT HEAD FINDINGS Brain: No evidence of acute infarction, hemorrhage, hydrocephalus, extra-axial collection or mass lesion/mass effect. 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 Stable chronic microvascular ischemic changes and volume loss of the brain. Vascular: As below. Skull: Normal. Negative for fracture or focal lesion. Sinuses/Orbits: Small fluid levels within the paranasal sinuses. Normal aeration of mastoid air cells. Orbits are unremarkable. Other: None. ASPECTS (Alberta Stroke Program Early CT Score) - Ganglionic level infarction (caudate, lentiform nuclei, internal capsule, insula, M1-M3 cortex): 7 - Supraganglionic infarction (M4-M6 cortex): 3 Total score (0-10 with 10 being normal): 10 Review of the MIP images confirms the above findings CTA NECK FINDINGS Aortic arch: Standard branching. Imaged portion shows no evidence of aneurysm or dissection. No significant stenosis of the major arch vessel origins. Moderate mixed plaque of the aortic arch. Right carotid system: No evidence of dissection, stenosis (50% or greater) or occlusion. Predominant fibrofatty plaque of the carotid bifurcation with minimal less than 30% proximal ICA stenosis. Left carotid system: No evidence of dissection, stenosis (50% or greater) or occlusion. Predominant fibrofatty plaque of the carotid bifurcation with mild less than 50% proximal ICA stenosis. Mild nonstenotic beaded irregularity of the left mid cervical ICA. Vertebral arteries: Codominant. No evidence of dissection, stenosis (50% or greater) or occlusion. Segments of non stenotic beaded irregularity of the vertebral arteries. Skeleton: Moderate severe spondylosis of the cervical spine. Multilevel uncovertebral and facet hypertrophy with bony foraminal encroachment. Spinal canal stenosis is  greatest at the C6-7 level where it is mild-to-moderate. Grade 1 C7-T1 anterolisthesis. Other neck: Negative. Upper chest: Mild centrilobular emphysema of the lung apices. Review of the MIP images confirms the above findings CTA HEAD FINDINGS Anterior circulation: No significant stenosis, proximal occlusion, aneurysm, or vascular malformation. Non stenotic calcific atherosclerosis of carotid siphons. Posterior circulation: No significant stenosis, proximal occlusion, aneurysm, or vascular malformation. Mild right P2 stenosis. Venous sinuses: As permitted by contrast timing, patent. Anatomic variants: Complete circle-of-Willis. Right fetal PCA. Review of the MIP images confirms the above findings CT Brain Perfusion Findings: CBF (<30%) Volume: 0mL Perfusion (Tmax>6.0s) volume: 0mL Mismatch Volume: 0mL Infarction Location:Negative. IMPRESSION: CT head: 1. No acute intracranial abnormality identified.  ASPECTS is 10. 2. Stable chronic microvascular ischemic changes and volume loss of the brain. 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 CT perfusion head: Negative CT perfusion head. No acute stroke or ischemic penumbra by RAPID perfusion criteria. CTA neck: 1. Patent carotid and vertebral arteries. No dissection, aneurysm, or high-grade stenosis by NASCET criteria. 2. Predominant fibrofatty plaque of bilateral carotid bifurcations with mild less than 50% proximal ICA stenosis. 3. 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 4. Moderate severe spondylosis of the  cervical spine greatest at C6-7. CTA head: Patent anterior and posterior intracranial circulation. No large vessel occlusion, aneurysm, or significant stenosis is identified. These results were called by telephone at the time of interpretation on 09/06/2018 at 1:06 am to Dr. Bayard Males , who verbally acknowledged these results. Electronically Signed   By: Mitzi Hansen M.D.   On: 09/06/2018 01:09   Ct Angio Neck W Or Wo Contrast  Result Date: 09/06/2018 CLINICAL DATA:  73 y/o F; seizure-like activity. Focal neural deficit. Code stroke. EXAM: CT HEAD WITHOUT CONTRAST CT ANGIOGRAPHY HEAD AND NECK CT PERFUSION BRAIN TECHNIQUE: Multidetector CT imaging of the head and neck was performed using the standard protocol during bolus administration of intravenous contrast. Multiplanar CT image reconstructions and MIPs were obtained to evaluate the vascular anatomy. Carotid stenosis measurements (when applicable) are obtained utilizing NASCET criteria, using the distal internal carotid diameter as the denominator. Multiphase CT imaging of the brain was performed following IV bolus contrast injection. Subsequent parametric perfusion maps were calculated using RAPID software. CONTRAST:  ISOVUE-370 IOPAMIDOL (ISOVUE-370) INJECTION 76% COMPARISON:  07/19/2018 CT head. 07/09/2018 MRI head. FINDINGS: CT HEAD FINDINGS Brain: No evidence of acute infarction, hemorrhage, hydrocephalus, extra-axial collection or mass lesion/mass effect. 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 Stable chronic microvascular ischemic changes and volume loss of the brain. Vascular: As below. Skull: Normal. Negative for fracture or focal lesion. Sinuses/Orbits: Small fluid levels within the paranasal sinuses. Normal aeration of mastoid air cells. Orbits are unremarkable. Other: None. ASPECTS (Alberta Stroke Program Early CT Score) - Ganglionic level infarction (caudate, lentiform nuclei, internal capsule, insula, M1-M3 cortex): 7 - Supraganglionic infarction (M4-M6 cortex): 3 Total score (0-10 with 10 being normal): 10 Review of the MIP images confirms the above findings CTA NECK FINDINGS Aortic arch: Standard branching. Imaged portion shows no evidence of aneurysm or dissection. No significant stenosis of the major arch vessel  origins. Moderate mixed plaque of the aortic arch. Right carotid system: No evidence of dissection, stenosis (50% or greater) or occlusion. Predominant fibrofatty plaque of the carotid bifurcation with minimal less than 30% proximal ICA stenosis. Left carotid system: No evidence of dissection, stenosis (50% or greater) or occlusion. Predominant fibrofatty plaque of the carotid bifurcation with mild less than 50% proximal ICA stenosis. Mild nonstenotic beaded irregularity of the left mid cervical ICA. Vertebral arteries: Codominant. No evidence of dissection, stenosis (50% or greater) or occlusion. Segments of non stenotic beaded irregularity of the vertebral arteries. Skeleton: Moderate severe spondylosis of the cervical spine. Multilevel uncovertebral and facet hypertrophy with bony foraminal encroachment. Spinal canal stenosis is greatest at the C6-7 level where it is mild-to-moderate. Grade 1 C7-T1 anterolisthesis. Other neck: Negative. Upper chest: Mild centrilobular emphysema of the lung apices. Review of the MIP images confirms the above findings CTA HEAD FINDINGS Anterior circulation: No significant stenosis, proximal occlusion, aneurysm, or vascular malformation. Non stenotic calcific atherosclerosis of carotid siphons. Posterior circulation: No significant stenosis, proximal occlusion, aneurysm, or vascular malformation. Mild right P2 stenosis. Venous sinuses: As permitted by contrast timing, patent. Anatomic variants: Complete circle-of-Willis. Right fetal PCA. Review of the MIP images confirms the above findings CT Brain Perfusion Findings: CBF (<30%) Volume: 0mL Perfusion (Tmax>6.0s) volume: 0mL Mismatch Volume: 0mL Infarction Location:Negative. IMPRESSION: CT head: 1. No acute intracranial abnormality identified.  ASPECTS is 10. 2. Stable chronic microvascular ischemic changes and volume loss of the brain. 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 CT  perfusion head: Negative CT perfusion head. No acute stroke or ischemic penumbra by RAPID perfusion criteria. CTA neck: 1. Patent carotid and vertebral arteries. No dissection, aneurysm, or high-grade stenosis by NASCET criteria. 2. Predominant fibrofatty plaque of bilateral carotid bifurcations with mild less than 50% proximal ICA stenosis. 3. 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 4. Moderate severe spondylosis of the cervical spine greatest at C6-7. CTA head: Patent anterior and posterior intracranial circulation. No large vessel occlusion, aneurysm, or significant stenosis is identified. These results were called by telephone at the time of interpretation on 09/06/2018 at 1:06 am to Dr. Bayard Males , who verbally acknowledged these results. Electronically Signed   By: Mitzi Hansen M.D.   On: 09/06/2018 01:09   Mr Brain Wo Contrast  Result Date: 09/06/2018 CLINICAL DATA:  Initial evaluation for acute syncope, history of atrial fibrillation. Evaluate for stroke. EXAM: MRI HEAD WITHOUT CONTRAST TECHNIQUE: Multiplanar, multiecho pulse sequences of the brain and surrounding structures were obtained without intravenous contrast. COMPARISON:  Comparison made with prior CTs from earlier the same day. FINDINGS: Brain: Diffuse prominence of the CSF containing spaces compatible with generalized age-related cerebral atrophy. Patchy and confluent T2/FLAIR hyperintensity within the periventricular deep white matter both cerebral hemispheres consistent with chronic microvascular ischemic disease, moderate to advanced in nature. Small chronic left occipital lobe infarct noted. Additional small chronic cortical infarct noted at the anterior left frontal lobe. Few tiny remote left cerebellar infarcts. No abnormal foci of restricted diffusion to suggest acute or subacute ischemia. Gray-white matter differentiation maintained. No evidence for acute or  chronic intracranial hemorrhage. No mass lesion, midline shift or mass effect. Ventricular prominence related to global parenchymal volume loss without hydrocephalus. No extra-axial fluid collection. Pituitary gland within normal limits. Vascular: Major intracranial vascular flow voids maintained. Skull and upper cervical spine: Craniocervical junction within normal limits. Multilevel degenerative spondylolysis noted within the upper cervical spine with resultant mild to moderate diffuse spinal stenosis. Bone marrow signal intensity within normal limits. No scalp soft tissue abnormality. Sinuses/Orbits: Globes and orbital soft tissues within normal limits. Mild scattered mucosal thickening throughout the paranasal sinuses. No air-fluid level to suggest acute sinusitis. Trace left mastoid effusion noted, of doubtful significance. Other: None. IMPRESSION: 1. No acute intracranial abnormality. 2. 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 Electronically Signed   By: Rise Mu M.D.   On: 09/06/2018 22:12   Ct Cerebral Perfusion W Contrast  Result Date: 09/06/2018 CLINICAL DATA:  73 y/o F; seizure-like activity. Focal neural deficit. Code stroke. EXAM: CT HEAD WITHOUT CONTRAST CT ANGIOGRAPHY HEAD AND NECK CT PERFUSION BRAIN TECHNIQUE: Multidetector CT imaging of the head and neck was performed using the standard protocol during bolus administration of intravenous contrast. Multiplanar CT image reconstructions and MIPs were obtained to evaluate the vascular anatomy. Carotid stenosis measurements (when applicable) are obtained utilizing NASCET criteria, using the distal internal carotid diameter as the denominator. Multiphase CT imaging of the brain was performed following IV bolus contrast injection. Subsequent parametric perfusion maps were calculated using RAPID software. CONTRAST:   ISOVUE-370 IOPAMIDOL (ISOVUE-370) INJECTION 76% COMPARISON:  07/19/2018 CT head. 07/09/2018 MRI head. FINDINGS: CT HEAD FINDINGS Brain: No evidence of acute infarction, hemorrhage, hydrocephalus, extra-axial collection or mass lesion/mass effect. 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 Stable chronic microvascular ischemic  changes and volume loss of the brain. Vascular: As below. Skull: Normal. Negative for fracture or focal lesion. Sinuses/Orbits: Small fluid levels within the paranasal sinuses. Normal aeration of mastoid air cells. Orbits are unremarkable. Other: None. ASPECTS ([place] Stroke Program Early CT Score) - Ganglionic level infarction (caudate, lentiform nuclei, internal capsule, insula, M1-M3 cortex): 7 - Supraganglionic infarction (M4-M6 cortex): 3 Total score (0-10 with 10 being normal): 10 Review of the MIP images confirms the above findings CTA NECK FINDINGS Aortic arch: Standard branching. Imaged portion shows no evidence of aneurysm or dissection. No significant stenosis of the major arch vessel origins. Moderate mixed plaque of the aortic arch. Right carotid system: No evidence of dissection, stenosis (50% or greater) or occlusion. Predominant fibrofatty plaque of the carotid bifurcation with minimal less than 30% proximal ICA stenosis. Left carotid system: No evidence of dissection, stenosis (50% or greater) or occlusion. Predominant fibrofatty plaque of the carotid bifurcation with mild less than 50% proximal ICA stenosis. Mild nonstenotic beaded irregularity of the left mid cervical ICA. Vertebral arteries: Codominant. No evidence of dissection, stenosis (50% or greater) or occlusion. Segments of non stenotic beaded irregularity of the vertebral arteries. Skeleton: Moderate severe spondylosis of the cervical spine. Multilevel uncovertebral and facet hypertrophy with bony foraminal encroachment. Spinal canal stenosis is  greatest at the C6-7 level where it is mild-to-moderate. Grade 1 C7-T1 anterolisthesis. Other neck: Negative. Upper chest: Mild centrilobular emphysema of the lung apices. Review of the MIP images confirms the above findings CTA HEAD FINDINGS Anterior circulation: No significant stenosis, proximal occlusion, aneurysm, or vascular malformation. Non stenotic calcific atherosclerosis of carotid siphons. Posterior circulation: No significant stenosis, proximal occlusion, aneurysm, or vascular malformation. Mild right P2 stenosis. Venous sinuses: As permitted by contrast timing, patent. Anatomic variants: Complete circle-of-Willis. Right fetal PCA. Review of the MIP images confirms the above findings CT Brain Perfusion Findings: CBF (<30%) Volume: 67mL Perfusion (Tmax>6.0s) volume: 8mL Mismatch Volume: 19mL Infarction Location:Negative. IMPRESSION: CT head: 1. No acute intracranial abnormality identified.  ASPECTS is 10. 2. Stable chronic microvascular ischemic changes and volume loss of the brain. 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 CT perfusion head: Negative CT perfusion head. No acute stroke or ischemic penumbra by RAPID perfusion criteria. CTA neck: 1. Patent carotid and vertebral arteries. No dissection, aneurysm, or high-grade stenosis by NASCET criteria. 2. Predominant fibrofatty plaque of bilateral carotid bifurcations with mild less than 50% proximal ICA stenosis. 3. 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 4. Moderate severe spondylosis of the cervical spine greatest at C6-7. CTA head: Patent anterior and posterior intracranial circulation. No large vessel occlusion, aneurysm, or significant stenosis is identified. These results were called by telephone at the time of interpretation on 09/06/2018 at 1:06 am to Dr. Bayard Males , who verbally acknowledged these results. Electronically Signed   By: Mitzi Hansen M.D.   On: 09/06/2018 01:09   Ct Head Code Stroke Wo Contrast  Result Date: 09/06/2018 CLINICAL DATA:  73 y/o F; seizure-like activity. Focal neural deficit. Code stroke. EXAM: CT HEAD WITHOUT CONTRAST CT ANGIOGRAPHY HEAD AND NECK CT PERFUSION BRAIN TECHNIQUE: Multidetector CT imaging of the head and neck was performed using the standard protocol during bolus administration of intravenous contrast. Multiplanar CT image reconstructions and MIPs were obtained to evaluate the vascular anatomy. Carotid stenosis measurements (when applicable) are obtained utilizing NASCET criteria, using the distal internal carotid diameter as the denominator. Multiphase CT imaging  of the brain was performed following IV bolus contrast injection. Subsequent parametric perfusion maps were calculated using RAPID software. CONTRAST:  100mL ISOVUE-370 IOPAMIDOL (ISOVUE-370) INJECTION 76% COMPARISON:  07/19/2018 CT head. 07/09/2018 MRI head. FINDINGS: CT HEAD FINDINGS Brain: No evidence of acute infarction, hemorrhage, hydrocephalus, extra-axial collection or mass lesion/mass effect. 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 Stable chronic microvascular ischemic changes and volume loss of the brain. Vascular: As below. Skull: Normal. Negative for fracture or focal lesion. Sinuses/Orbits: Small fluid levels within the paranasal sinuses. Normal aeration of mastoid air cells. Orbits are unremarkable. Other: None. ASPECTS ([place]ber[place] Stroke Program Early CT Score) - Ganglionic level infarction (caudate, lentiform nuclei, internal capsule, insula, M1-M3 cortex): 7 - Supraganglionic infarction (M4-M6 cortex): 3 Total score (0-10 with 10 being normal): 10 Review of the MIP images confirms the above findings CTA NECK FINDINGS Aortic arch: Standard branching. Imaged portion shows no evidence of aneurysm or dissection. No significant stenosis of the major arch vessel  origins. Moderate mixed plaque of the aortic arch. Right carotid system: No evidence of dissection, stenosis (50% or greater) or occlusion. Predominant fibrofatty plaque of the carotid bifurcation with minimal less than 30% proximal ICA stenosis. Left carotid system: No evidence of dissection, stenosis (50% or greater) or occlusion. Predominant fibrofatty plaque of the carotid bifurcation with mild less than 50% proximal ICA stenosis. Mild nonstenotic beaded irregularity of the left mid cervical ICA. Vertebral arteries: Codominant. No evidence of dissection, stenosis (50% or greater) or occlusion. Segments of non stenotic beaded irregularity of the vertebral arteries. Skeleton: Moderate severe spondylosis of the cervical spine. Multilevel uncovertebral and facet hypertrophy with bony foraminal encroachment. Spinal canal stenosis is greatest at the C6-7 level where it is mild-to-moderate. Grade 1 C7-T1 anterolisthesis. Other neck: Negative. Upper chest: Mild centrilobular emphysema of the lung apices. Review of the MIP images confirms the above findings CTA HEAD FINDINGS Anterior circulation: No significant stenosis, proximal occlusion, aneurysm, or vascular malformation. Non stenotic calcific atherosclerosis of carotid siphons. Posterior circulation: No significant stenosis, proximal occlusion, aneurysm, or vascular malformation. Mild right P2 stenosis. Venous sinuses: As permitted by contrast timing, patent. Anatomic variants: Complete circle-of-Willis. Right fetal PCA. Review of the MIP images confirms the above findings CT Brain Perfusion Findings: CBF (<30%) Volume: 0mL Perfusion (Tmax>6.0s) volume: 0mL Mismatch Volume: 0mL Infarction Location:Negative. IMPRESSION: CT head: 1. No acute intracranial abnormality identified.  ASPECTS is 10. 2. Stable chronic microvascular ischemic changes and volume loss of the brain. 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 CT  perfusion head: Negative CT perfusion head. No acute stroke or ischemic penumbra by RAPID perfusion criteria. CTA neck: 1. Patent carotid and vertebral arteries. No dissection, aneurysm, or high-grade stenosis by NASCET criteria. 2. Predominant fibrofatty plaque of bilateral carotid bifurcations with mild less than 50% proximal ICA stenosis. 3. 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 4. Moderate severe spondylosis of the cervical spine greatest at C6-7. CTA head: Patent anterior and posterior intracranial circulation. No large vessel occlusion, aneurysm, or significant stenosis is identified. These results were called by telephone at the time of interpretation on 09/06/2018 at 1:06 am to Dr. Bayard MalesANDOLPH BROWN , who verbally acknowledged these results. Electronically Signed   By: Mitzi HansenLance  Furusawa-Stratton M.D.   On: 09/06/2018 01:09    Medications:  I have reviewed the patient's current medications. Scheduled: . amLODipine  5 mg Oral Daily  .  apixaban  5 mg Oral BID  . atorvastatin  20 mg Oral QHS  . docusate sodium  100 mg Oral BID  . nicotine  21 mg Transdermal Daily    Assessment/Plan: Patient slowly improving.  MR of the brain reviewed and shows no acute changes. EEG shows no epileptiform discharges but patient continues to present with similar complaints.  Will give a trial of anticonvulsant to see if this is helpful for her.    Recommendations: 1. Orthostatics 2. Continue Keppra at 500mg  BID po   LOS: 1 day   Thana Farr, MD Neurology [phone] 09/07/2018  11:34 AM

## 2018-09-07 NOTE — Progress Notes (Signed)
Pt alert, pleasant, cooperative; forgetful. Denies co's. Noted hematuria in foley tubing with pt on eliquis with Dr. Elisabeth Pigeon notified with no new orders. Family member has called numberous times with updates given. No seizure activity noted.

## 2018-09-08 LAB — BASIC METABOLIC PANEL
ANION GAP: 6 (ref 5–15)
BUN: 18 mg/dL (ref 8–23)
CO2: 21 mmol/L — ABNORMAL LOW (ref 22–32)
Calcium: 8 mg/dL — ABNORMAL LOW (ref 8.9–10.3)
Chloride: 116 mmol/L — ABNORMAL HIGH (ref 98–111)
Creatinine, Ser: 1.2 mg/dL — ABNORMAL HIGH (ref 0.44–1.00)
GFR calc Af Amer: 52 mL/min — ABNORMAL LOW (ref >60)
GFR calc non Af Amer: 45 mL/min — ABNORMAL LOW (ref >60)
Glucose, Bld: 92 mg/dL (ref 70–99)
Potassium: 3.3 mmol/L — ABNORMAL LOW (ref 3.5–5.1)
Sodium: 143 mmol/L (ref 135–145)

## 2018-09-08 MED ORDER — POTASSIUM CHLORIDE CRYS ER 20 MEQ PO TBCR
20.0000 meq | EXTENDED_RELEASE_TABLET | Freq: Two times a day (BID) | ORAL | Status: DC
  Administered 2018-09-08 – 2018-09-09 (×3): 20 meq via ORAL
  Filled 2018-09-08 (×3): qty 1

## 2018-09-08 NOTE — Progress Notes (Signed)
Subjective: Patient continues to improve.  Reports that she is stronger today.    Objective: Current vital signs: BP (!) 127/56 (BP Location: Right Arm)   Pulse 73   Temp 98.7 F (37.1 C) (Oral)   Resp 20   Ht 5\' 4"  (1.626 m)   Wt 61.3 kg   LMP  (LMP Unknown) Comment: uta  SpO2 95%   BMI 23.20 kg/m  Vital signs in last 24 hours: Temp:  [98.4 F (36.9 C)-98.7 F (37.1 C)] 98.7 F (37.1 C) (01/12 0921) Pulse Rate:  [73-85] 73 (01/12 0921) BP: (123-144)/(56-70) 127/56 (01/12 0921) SpO2:  [92 %-97 %] 95 % (01/12 0921) Weight:  [61.3 kg] 61.3 kg (01/12 0500)  Intake/Output from previous day: 01/11 0701 - 01/12 0700 In: 763.2 [P.O.:600; I.V.:63.2; IV Piggyback:100] Out: 950 [Urine:950] Intake/Output this shift: No intake/output data recorded. Nutritional status:  Diet Order            Diet Heart Room service appropriate? Yes; Fluid consistency: Thin  Diet effective now              Neurologic Exam: Mental Status: Alert.  Knows the month but reports that it is 2012.  Unable to tell me the day of the week.  Speech fluent without evidence of aphasia.  Able to follow 3 step commands without difficulty. Cranial Nerves: II: Discs flat bilaterally; Visual fields grossly normal, pupils equal, round, reactive to light and accommodation III,IV, VI: ptosis not present, extra-ocular motions intact bilaterally V,VII: smile symmetric, facial light touch sensation normal bilaterally VIII: hearing normal bilaterally IX,X: gag reflex present XI: bilateral shoulder shrug XII: midline tongue extension Motor: Lifts both upper extremities off the bed, left greater than right.  Able to lift both legs off the bed although weakly (left greater than right).       Lab Results: Basic Metabolic Panel: Recent Labs  Lab 09/04/18 1517 09/06/18 0020 09/08/18 0433  NA 142 144 143  K 3.6 3.5 3.3*  CL 116* 118* 116*  CO2 18* 20* 21*  GLUCOSE 92 87 92  BUN 11 13 18   CREATININE 1.39*  1.32* 1.20*  CALCIUM 8.3* 8.2* 8.0*    Liver Function Tests: Recent Labs  Lab 09/06/18 0020  AST 17  ALT 10  ALKPHOS 67  BILITOT 0.3  PROT 6.2*  ALBUMIN 3.0*   No results for input(s): LIPASE, AMYLASE in the last 168 hours. No results for input(s): AMMONIA in the last 168 hours.  CBC: Recent Labs  Lab 09/04/18 1517 09/06/18 0020  WBC 6.2 6.6  NEUTROABS 4.2 4.4  HGB 11.3* 10.8*  HCT 35.5* 34.1*  MCV 95.2 95.8  PLT 190 181    Cardiac Enzymes: Recent Labs  Lab 09/04/18 1517 09/06/18 0020  TROPONINI <0.03 <0.03    Lipid Panel: No results for input(s): CHOL, TRIG, HDL, CHOLHDL, VLDL, LDLCALC in the last 168 hours.  CBG: Recent Labs  Lab 09/05/18 2351 09/06/18 0410  GLUCAP 77 108*    Microbiology: Results for orders placed or performed during the hospital encounter of 09/05/18  Urine culture     Status: Abnormal   Collection Time: 09/06/18 12:53 AM  Result Value Ref Range Status   Specimen Description URINE, RANDOM  Final   Special Requests   Final    NONE Performed at [facility], [address] [place], [place] [id number]    Culture 10,000 COLONIES/mL YEAST (A)  Final   Report Status 09/07/2018 FINAL  Final    Coagulation  Studies: Recent Labs    09/06/18 0053  LABPROT 20.7*  INR 1.80    Imaging: Mr Brain Wo Contrast  Result Date: 09/06/2018 CLINICAL DATA:  Initial evaluation for acute syncope, history of atrial fibrillation. Evaluate for stroke. EXAM: MRI HEAD WITHOUT CONTRAST TECHNIQUE: Multiplanar, multiecho pulse sequences of the brain and surrounding structures were obtained without intravenous contrast. COMPARISON:  Comparison made with prior CTs from earlier the same day. FINDINGS: Brain: Diffuse prominence of the CSF containing spaces compatible with generalized age-related cerebral atrophy. Patchy and confluent T2/FLAIR hyperintensity within the periventricular deep white matter both cerebral hemispheres consistent with chronic  microvascular ischemic disease, moderate to advanced in nature. Small chronic left occipital lobe infarct noted. Additional small chronic cortical infarct noted at the anterior left frontal lobe. Few tiny remote left cerebellar infarcts. No abnormal foci of restricted diffusion to suggest acute or subacute ischemia. Gray-white matter differentiation maintained. No evidence for acute or chronic intracranial hemorrhage. No mass lesion, midline shift or mass effect. Ventricular prominence related to global parenchymal volume loss without hydrocephalus. No extra-axial fluid collection. Pituitary gland within normal limits. Vascular: Major intracranial vascular flow voids maintained. Skull and upper cervical spine: Craniocervical junction within normal limits. Multilevel degenerative spondylolysis noted within the upper cervical spine with resultant mild to moderate diffuse spinal stenosis. Bone marrow signal intensity within normal limits. No scalp soft tissue abnormality. Sinuses/Orbits: Globes and orbital soft tissues within normal limits. Mild scattered mucosal thickening throughout the paranasal sinuses. No air-fluid level to suggest acute sinusitis. Trace left mastoid effusion noted, of doubtful significance. Other: None. IMPRESSION: 1. No acute intracranial abnormality. 2. 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 Electronically Signed   By: Rise Mu M.D.   On: 09/06/2018 22:12    Medications:  I have reviewed the patient's current medications. Scheduled: . amLODipine  5 mg Oral Daily  . apixaban  5 mg Oral BID  . atorvastatin  20 mg Oral QHS  . docusate sodium  100 mg Oral BID  . levETIRAcetam  500 mg Oral BID  . nicotine  21 mg Transdermal Daily  . potassium chloride  20 mEq Oral BID    Assessment/Plan: Patient continues to slowly improve.  Tolerating Keppra at 500mg  BID  without noted side effects.    Recommendations: 1. PT to evaluate 2. Continue Keppra at current dose   LOS: 2 days   Thana Farr, MD Neurology [phone] 09/08/2018  10:08 AM

## 2018-09-08 NOTE — Progress Notes (Signed)
States she is feeling better today and her legs fell stronger. Occasional loose productive cough. Destiny Mack family member has called numerous times today to check to see if pt being discharged and update in her status. No visible blood in urine in foley tubing/bag thus far today with pt continued on eliquis. No seizures observed.

## 2018-09-08 NOTE — Progress Notes (Signed)
Seen patient lying on bed, not in any distress, denies any pain and discomforts. Continues to have an indwelling catheter, urine clear, no gross hematuria noted. Urine bag changed, foley care rendered. Perineal care done, New pink foam to bottom applied, groin area powdered with antifungal medicine. Needs attended. Kept safe and comfortable.

## 2018-09-08 NOTE — Progress Notes (Signed)
[employer] [facility] [place] [facility]   PATIENT NAME: Destiny Mack    MR#:  [id number]  DATE OF BIRTH:  March 29, 1946  SUBJECTIVE:  CHIEF COMPLAINT:   Chief Complaint  Patient presents with  . Seizures  . Code Stroke   Brought with worsening mental status and possible seizure. Completely alert and oriented today.  Continue to have both lower extremity weakness, but slightly better than yesterday.  REVIEW OF SYSTEMS:  CONSTITUTIONAL: No fever, fatigue or weakness.  EYES: No blurred or double vision.  EARS, NOSE, AND THROAT: No tinnitus or ear pain.  RESPIRATORY: No cough, shortness of breath, wheezing or hemoptysis.  CARDIOVASCULAR: No chest pain, orthopnea, edema.  GASTROINTESTINAL: No nausea, vomiting, diarrhea or abdominal pain.  GENITOURINARY: No dysuria, hematuria.  ENDOCRINE: No polyuria, nocturia,  HEMATOLOGY: No anemia, easy bruising or bleeding SKIN: No rash or lesion. MUSCULOSKELETAL: No joint pain or arthritis.   NEUROLOGIC: No tingling, numbness, both lower extremity weakness.  PSYCHIATRY: No anxiety or depression.   ROS  DRUG ALLERGIES:  No Known Allergies  VITALS:  Blood pressure 124/61, pulse 77, temperature 99 F (37.2 C), temperature source Oral, resp. rate 20, height 5\' 4"  (1.626 m), weight 61.3 kg, SpO2 94 %.  PHYSICAL EXAMINATION:  GENERAL:  73 y.o.-year-old patient lying in the bed with no acute distress.  EYES: Pupils equal, round, reactive to light and accommodation. No scleral icterus. Extraocular muscles intact.  HEENT: Head atraumatic, normocephalic. Oropharynx and nasopharynx clear.  NECK:  Supple, no jugular venous distention. No thyroid enlargement, no tenderness.  LUNGS: Normal breath sounds bilaterally, no wheezing, rales,rhonchi or crepitation. No use of accessory muscles of respiration.  CARDIOVASCULAR: S1, S2 normal. No murmurs, rubs, or gallops.  ABDOMEN: Soft, nontender, nondistended. Bowel sounds present. No organomegaly or  mass.  EXTREMITIES: No pedal edema, cyanosis, or clubbing.  NEUROLOGIC: Cranial nerves II through XII are intact. Muscle strength 5/5 in upper and 2-3 out of 5 in both lower extremities. Sensation intact. Gait not checked.  PSYCHIATRIC: The patient is alert and oriented x 3.  SKIN: No obvious rash, lesion, or ulcer.   Physical Exam LABORATORY PANEL:   CBC Recent Labs  Lab 09/06/18 0020  WBC 6.6  HGB 10.8*  HCT 34.1*  PLT 181   ------------------------------------------------------------------------------------------------------------------  Chemistries  Recent Labs  Lab 09/06/18 0020 09/08/18 0433  NA 144 143  K 3.5 3.3*  CL 118* 116*  CO2 20* 21*  GLUCOSE 87 92  BUN 13 18  CREATININE 1.32* 1.20*  CALCIUM 8.2* 8.0*  AST 17  --   ALT 10  --   ALKPHOS 67  --   BILITOT 0.3  --    ------------------------------------------------------------------------------------------------------------------  Cardiac Enzymes Recent Labs  Lab 09/04/18 1517 09/06/18 0020  TROPONINI <0.03 <0.03   ------------------------------------------------------------------------------------------------------------------  RADIOLOGY:  Mr Brain Wo Contrast  Result Date: 09/06/2018 CLINICAL DATA:  Initial evaluation for acute syncope, history of atrial fibrillation. Evaluate for stroke. EXAM: MRI HEAD WITHOUT CONTRAST TECHNIQUE: Multiplanar, multiecho pulse sequences of the brain and surrounding structures were obtained without intravenous contrast. COMPARISON:  Comparison made with prior CTs from earlier the same day. FINDINGS: Brain: Diffuse prominence of the CSF containing spaces compatible with generalized age-related cerebral atrophy. Patchy and confluent T2/FLAIR hyperintensity within the periventricular deep white matter both cerebral hemispheres consistent with chronic microvascular ischemic disease, moderate to advanced in nature. Small chronic left occipital lobe infarct noted. Additional  small chronic cortical infarct noted at the anterior left frontal lobe.  Few tiny remote left cerebellar infarcts. No abnormal foci of restricted diffusion to suggest acute or subacute ischemia. Gray-white matter differentiation maintained. No evidence for acute or chronic intracranial hemorrhage. No mass lesion, midline shift or mass effect. Ventricular prominence related to global parenchymal volume loss without hydrocephalus. No extra-axial fluid collection. Pituitary gland within normal limits. Vascular: Major intracranial vascular flow voids maintained. Skull and upper cervical spine: Craniocervical junction within normal limits. Multilevel degenerative spondylolysis noted within the upper cervical spine with resultant mild to moderate diffuse spinal stenosis. Bone marrow signal intensity within normal limits. No scalp soft tissue abnormality. Sinuses/Orbits: Globes and orbital soft tissues within normal limits. Mild scattered mucosal thickening throughout the paranasal sinuses. No air-fluid level to suggest acute sinusitis. Trace left mastoid effusion noted, of doubtful significance. Other: None. IMPRESSION: 1. No acute intracranial abnormality. 2. 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 Electronically Signed   By: Rise MuBenjamin  McClintock M.D.   On: 09/06/2018 22:12    ASSESSMENT AND PLAN:   Active Problems:   Seizure [facility](HCC)  This is a 73 year old female admitted for seizure.  * Seizure: New onset; loaded with Keppra in the emergency department.  Continue to monitor for seizure activity.  Consult neurology. EEG done which did not show any epileptic activities, but due to her recurrent presentation with similar symptoms neurology suggested to continue Keppra. MRI negative.  *UTI-continue Rocephin and follow urine culture. As per culture reports there is yeast in the urine but did not  grow any bacteria.  Stop ceftriaxone.  Patient received total 3 days of therapy.  *  Hypertension: Controlled; continue amlodipine  *  Atrial fibrillation: Rate controlled; continue Eliquis.  *  History of stroke: The patient has a history of falls and likely has gait disturbance secondary to her remote strokes.  Continue secondary prevention by continuing statin therapy and control of atrial fibrillation and hypertension.  *  DVT prophylaxis: As above *  GI prophylaxis: None  Physical therapy evaluation for her weakness.  All the records are reviewed and case discussed with Care Management/Social Workerr. Management plans discussed with the patient, family and they are in agreement.  CODE STATUS: FUll.  TOTAL TIME TAKING CARE OF THIS PATIENT: 35 minutes.    POSSIBLE D/C IN 1-2 DAYS, DEPENDING ON CLINICAL CONDITION.   Altamese DillingVaibhavkumar Paden Senger M.D on 09/08/2018   Between 7am to 6pm - Pager - [phone]  After 6pm go to [email or web address] - password [email or web address]  [facility]  Office  [phone]  CC: Primary care physician; Oswaldo ConroyBender, [name], MD  Note: This dictation was prepared with Dragon dictation along with smaller phrase technology. Any transcriptional errors that result from this process are unintentional.

## 2018-09-08 NOTE — Progress Notes (Signed)
Family Meeting Note  Advance Directive:yes  Today a meeting took place with the Patient.    The following clinical team members were present during this meeting:MD  The following were discussed:Patient's diagnosis: Seizures, weakness, UTI, Patient's progosis: Unable to determine and Goals for treatment: DNR  Additional follow-up to be provided: *PMD  Time spent during discussion:20 minutes  Altamese Dilling, MD

## 2018-09-09 LAB — CBC
HCT: 30 % — ABNORMAL LOW (ref 36.0–46.0)
Hemoglobin: 9.4 g/dL — ABNORMAL LOW (ref 12.0–15.0)
MCH: 29.8 pg (ref 26.0–34.0)
MCHC: 31.3 g/dL (ref 30.0–36.0)
MCV: 95.2 fL (ref 80.0–100.0)
Platelets: 180 10*3/uL (ref 150–400)
RBC: 3.15 MIL/uL — ABNORMAL LOW (ref 3.87–5.11)
RDW: 16.1 % — AB (ref 11.5–15.5)
WBC: 7.5 10*3/uL (ref 4.0–10.5)
nRBC: 0 % (ref 0.0–0.2)

## 2018-09-09 MED ORDER — LEVETIRACETAM 500 MG PO TABS: 500.0000 mg | ORAL_TABLET | Freq: Two times a day (BID) | ORAL | 1 refills | Status: DC

## 2018-09-09 NOTE — Care Management (Signed)
Discharge to home today per Dr. Cherlynn Kaiser. Services with [facility] will be resumed. [name], [employer] representative updated. Transportation per [employer] Gwenette Greet RN MSN CCM Care Management [phone]

## 2018-09-09 NOTE — Evaluation (Signed)
Physical Therapy Evaluation Patient Details Name: Destiny Mack MRN: [id number] DOB: 28-Jul-1946 Today's Date: 09/09/2018   History of Present Illness  Patient is a 73 year old female admitted with seizure. Patient had CVA in Nov 2019. PMH to include A fib, HTN, freq falls, depression.     Clinical Impression  Patient agreeable to PT eval. She reports she does not walk, she mainly stays in bed at home. Lives with her daughter and grandson who provide assistance. She is able to get up to [facility] or wheelchair with assist. Patient demonstrates poor sitting balance, requiring mod/max assist to maintain balance. Requires assistance for all bed mobility due to weakness R>L, but generalized weakness throughout.  Patient plans to return home with daughter's assist. No family present at time of eval. Patient appears to be at baseline level of function.      Follow Up Recommendations Supervision/Assistance - 24 hour    Equipment Recommendations  None recommended by PT    Recommendations for Other Services       Precautions / Restrictions Precautions Precautions: Fall Restrictions Weight Bearing Restrictions: No      Mobility  Bed Mobility Overal bed mobility: Needs Assistance Bed Mobility: Supine to Sit;Sit to Supine     Supine to sit: Max assist Sit to supine: Max assist   General bed mobility comments: requires assistance for bring LEs on/off bed. Unable to raise trun into seated position without max assist or maintain sitting balance once positioned.   Transfers                 General transfer comment: unable at this time  Ambulation/Gait             General Gait Details: non-ambulatory  Stairs            Wheelchair Mobility    Modified Rankin (Stroke Patients Only)       Balance Overall balance assessment: Needs assistance Sitting-balance support: Bilateral upper extremity supported Sitting balance-Leahy Scale: Poor Sitting balance - Comments:  requires mod/max assist to maintain sitting at EOB. Posterior leaning.  Postural control: Posterior lean                                   Pertinent Vitals/Pain Pain Assessment: No/denies pain    Home Living Family/patient expects to be discharged to:: Private residence Living Arrangements: Children Available Help at Discharge: Family;Available 24 hours/day Type of Home: Apartment Home Access: Level entry     Home Layout: Two level;Able to live on main level with bedroom/bathroom Home Equipment: Bedside commode;Wheelchair - manual;Hospital bed Additional Comments: No family present to confirm information provided by pt, pt is a questionable historian- she reports she has not been ambulatory since her CVA and mainly stays in the bed.     Prior Function Level of Independence: Needs assistance   Gait / Transfers Assistance Needed: Non-ambulatory at baseline, performing transfers to/from manual WC total assist from son.   ADL's / Homemaking Assistance Needed: Previous PT note reports that pt recieves assist from daughter as needed for ADLs, grooming (wears depends, uses bedpan);  today pt states that she has been transferring to [facility].  Comments: Non-ambulatory at baseline, performing transfers to/from manual [facility] with significant assist from son.  Assist from daughter as needed for ADLs, grooming (wears depends, uses bedpan)     Hand Dominance   Dominant Hand: Right    Extremity/Trunk Assessment  Upper Extremity Assessment Upper Extremity Assessment: Generalized weakness;RUE deficits/detail RUE Coordination: decreased fine motor;decreased gross motor    Lower Extremity Assessment Lower Extremity Assessment: Generalized weakness       Communication   Communication: No difficulties  Cognition Arousal/Alertness: Awake/alert Behavior During Therapy: WFL for tasks assessed/performed Overall Cognitive Status: Within Functional Limits for tasks assessed                                         General Comments      Exercises     Assessment/Plan    PT Assessment Patent does not need any further PT services  PT Problem List         PT Treatment Interventions      PT Goals (Current goals can be found in the Care Plan section)  Acute Rehab PT Goals Patient Stated Goal: to return home with daughter PT Goal Formulation: With patient Time For Goal Achievement: 09/23/18 Potential to Achieve Goals: Good    Frequency     Barriers to discharge        Co-evaluation               AM-PAC PT "6 Clicks" Mobility  Outcome Measure Help needed turning from your back to your side while in a flat bed without using bedrails?: A Lot Help needed moving from lying on your back to sitting on the side of a flat bed without using bedrails?: A Lot Help needed moving to and from a bed to a chair (including a wheelchair)?: A Lot Help needed standing up from a chair using your arms (e.g., wheelchair or bedside chair)?: A Lot Help needed to walk in hospital room?: Total Help needed climbing 3-5 steps with a railing? : Total 6 Click Score: 10    End of Session   Activity Tolerance: Patient tolerated treatment well Patient left: in bed Nurse Communication: Mobility status PT Visit Diagnosis: Muscle weakness (generalized) (M62.81);History of falling (Z91.81);Difficulty in walking, not elsewhere classified (R26.2);Hemiplegia and hemiparesis Hemiplegia - Right/Left: Right Hemiplegia - dominant/non-dominant: Dominant Hemiplegia - caused by: Cerebral infarction    Time: 1410-1425 PT Time Calculation (min) (ACUTE ONLY): 15 min   Charges:   PT Evaluation $PT Eval Moderate Complexity: 1 Mod          Albena Comes, PT, GCS 09/09/18,3:06 PM

## 2018-09-09 NOTE — Care Management Important Message (Signed)
Important Message  Patient Details  Name: Destiny Mack MRN: [id number] Date of Birth: 03-12-1946   Medicare Important Message Given:  Other (see comment)  Patient sleeping.No family in room.  Left a copy of the Important Message from Medicare at bedside for review.  [name] A [name] 09/09/2018, 11:48 AM

## 2018-09-09 NOTE — Discharge Summary (Signed)
[facility] [place] at [facility]   PATIENT NAME: Destiny Mack    MR#:  [id number]  DATE OF BIRTH:  [birth year]  DATE OF ADMISSION:  09/05/2018 ADMITTING PHYSICIAN: Arnaldo Natal, MD  DATE OF DISCHARGE: 09/09/2018   PRIMARY CARE PHYSICIAN: Oswaldo Conroy, MD    ADMISSION DIAGNOSIS:  Seizure (HCC) [R56.9] Acute cystitis without hematuria [N30.00]  DISCHARGE DIAGNOSIS:  Active Problems:   Seizure (HCC)   SECONDARY DIAGNOSIS:   Past Medical History:  Diagnosis Date  . A-fib (HCC)   . Depression   . Frequent falls   . Hypertension   . Leaking of urine   . Mixed incontinence   . Stroke (HCC)   . Tremor of hands and face     HOSPITAL COURSE:   73 year old female with past medical history of atrial fibrillation, frequent falls, history of previous CVA, hypertension, depression who presented to the hospital after being found down with encephalopathy.  1.  Seizures-this was the source of patient's altered mental status/encephalopathy.  Patient has had repeated presentations for similar reasons and was seen by neurology.  Patient underwent extensive neurologic work-up including CT head, MRI of the brain which were negative.  Patient also had an EEG done which was negative for seizures. - Empirically patient was started on Keppra and been maintained on it and has had no further episodes of seizures and her mental status is back to baseline.  She is being discharged on the oral Keppra presently.  2.  Urinary tract infection-this was suspected based off a urinalysis on admission.  Patient has a chronic indwelling Foley catheter.  Her urine cultures although grew out only 10,000 colonies of yeast.  UTI has been ruled out, patient has been off antibiotics.  She is afebrile and hemodynamically stable.    3.  Essential hypertension-patient will continue her Norvasc.  4.  Chronic atrial fibrillation-rate controlled.  Patient will continue her Eliquis.  5.   History of previous CVA- patient will continue her Eliquis. -She will also continue her atorvastatin.   DISCHARGE CONDITIONS:   Stable  CONSULTS OBTAINED:  Treatment Team:  Kym Groom, MD  DRUG ALLERGIES:  No Known Allergies  DISCHARGE MEDICATIONS:   Allergies as of 09/09/2018   No Known Allergies     Medication List    TAKE these medications   amLODipine 5 MG tablet Commonly known as:  NORVASC Take 5 mg by mouth daily.   apixaban 5 MG Tabs tablet Commonly known as:  ELIQUIS Take 1 tablet (5 mg total) by mouth 2 (two) times daily.   atorvastatin 20 MG tablet Commonly known as:  LIPITOR Take 20 mg by mouth at bedtime.   levETIRAcetam 500 MG tablet Commonly known as:  KEPPRA Take 1 tablet (500 mg total) by mouth 2 (two) times daily.   nicotine 21 mg/24hr patch Commonly known as:  NICODERM CQ - dosed in mg/24 hours Place 1 patch (21 mg total) onto the skin daily.   senna-docusate 8.6-50 MG tablet Commonly known as:  Senokot-S Take 1 tablet by mouth at bedtime as needed for mild constipation.         DISCHARGE INSTRUCTIONS:   DIET:  Cardiac diet  DISCHARGE CONDITION:  Stable  ACTIVITY:  Activity as tolerated  OXYGEN:  Home Oxygen: No.   Oxygen Delivery: room air  DISCHARGE LOCATION:  Home with Home Health PT, RN, OT, Aide, Social Work.    If you experience worsening of your admission symptoms, develop shortness  of breath, life threatening emergency, suicidal or homicidal thoughts you must seek medical attention immediately by calling 911 or calling your MD immediately  if symptoms less severe.  You Must read complete instructions/literature along with all the possible adverse reactions/side effects for all the Medicines you take and that have been prescribed to you. Take any new Medicines after you have completely understood and accpet all the possible adverse reactions/side effects.   Please note  You were cared for by a hospitalist  during your hospital stay. If you have any questions about your discharge medications or the care you received while you were in the hospital after you are discharged, you can call the unit and asked to speak with the hospitalist on call if the hospitalist that took care of you is not available. Once you are discharged, your primary care physician will handle any further medical issues. Please note that NO REFILLS for any discharge medications will be authorized once you are discharged, as it is imperative that you return to your primary care physician (or establish a relationship with a primary care physician if you do not have one) for your aftercare needs so that they can reassess your need for medications and monitor your lab values.     Today   No acute events overnight, mental status is baseline.  No further seizure type activity.  Will discharge home with home health services today.  VITAL SIGNS:  Blood pressure (!) 122/50, pulse 72, temperature 98.5 F (36.9 C), temperature source Oral, resp. rate 16, height 5\' 4"  (1.626 m), weight 61.3 kg, SpO2 97 %.  I/O:    Intake/Output Summary (Last 24 hours) at 09/09/2018 1515 Last data filed at 09/09/2018 0204 Gross per 24 hour  Intake -  Output 775 ml  Net -775 ml    PHYSICAL EXAMINATION:   GENERAL:  73 y.o.-year-old patient lying in the bed with no acute distress.  EYES: Pupils equal, round, reactive to light and accommodation. No scleral icterus. Extraocular muscles intact.  HEENT: Head atraumatic, normocephalic. Oropharynx and nasopharynx clear.  NECK:  Supple, no jugular venous distention. No thyroid enlargement, no tenderness.  LUNGS: Normal breath sounds bilaterally, no wheezing, rales,rhonchi or crepitation. No use of accessory muscles of respiration.  CARDIOVASCULAR: S1, S2 normal. No murmurs, rubs, or gallops.  ABDOMEN: Soft, nontender, nondistended. Bowel sounds present. No organomegaly or mass.  EXTREMITIES: No pedal edema,  cyanosis, or clubbing.  NEUROLOGIC: Cranial nerves II through XII are intact. Muscle strength 5/5 in upper and 2-3 out of 5 in both lower extremities. Sensation intact. Gait not checked.  PSYCHIATRIC: The patient is alert and oriented x 3.  SKIN: No obvious rash, lesion, or ulcer.   DATA REVIEW:   CBC Recent Labs  Lab 09/09/18 0504  WBC 7.5  HGB 9.4*  HCT 30.0*  PLT 180    Chemistries  Recent Labs  Lab 09/06/18 0020 09/08/18 0433  NA 144 143  K 3.5 3.3*  CL 118* 116*  CO2 20* 21*  GLUCOSE 87 92  BUN 13 18  CREATININE 1.32* 1.20*  CALCIUM 8.2* 8.0*  AST 17  --   ALT 10  --   ALKPHOS 67  --   BILITOT 0.3  --     Cardiac Enzymes Recent Labs  Lab 09/06/18 0020  TROPONINI <0.03    Microbiology Results  Results for orders placed or performed during the hospital encounter of 09/05/18  Urine culture     Status: Abnormal   Collection  Time: 09/06/18 12:53 AM  Result Value Ref Range Status   Specimen Description URINE, RANDOM  Final   Special Requests   Final    NONE Performed at [facility], [address] [place], [place] [address]    Culture 10,000 COLONIES/mL YEAST (A)  Final   Report Status 09/07/2018 FINAL  Final    RADIOLOGY:  No results found.    Management plans discussed with the patient, family and they are in agreement.  CODE STATUS:     Code Status Orders  (From admission, onward)         Start     Ordered   09/08/18 2255  Do not attempt resuscitation (DNR)  Continuous    Question Answer Comment  In the event of cardiac or respiratory ARREST Do not call a "code blue"   In the event of cardiac or respiratory ARREST Do not perform Intubation, CPR, defibrillation or ACLS   In the event of cardiac or respiratory ARREST Use medication by any route, position, wound care, and other measures to relive pain and suffering. May use oxygen, suction and manual treatment of airway obstruction as needed for comfort.      09/08/18 2254          TOTAL TIME TAKING CARE OF THIS PATIENT: 40 minutes.    Houston SirenSAINANI,Jesicca Dipierro J M.D on 09/09/2018 at 3:15 PM  Between 7am to 6pm - Pager - [phone]  After 6pm go to [email or web address] - Social research officer, governmentpassword [email or web address]  [facility] [place] [facility]  Office  [phone]  CC: Primary care physician; Oswaldo ConroyBender, [name], MD

## 2018-09-13 ENCOUNTER — Encounter: Payer: Self-pay | Admitting: Emergency Medicine

## 2018-09-13 ENCOUNTER — Emergency Department: Payer: Medicare HMO

## 2018-09-13 ENCOUNTER — Inpatient Hospital Stay
Admission: EM | Admit: 2018-09-13 | Discharge: 2018-09-19 | DRG: 100 | Disposition: A | Discharge status: Hospice | Payer: Medicare HMO | Attending: Internal Medicine | Admitting: Internal Medicine

## 2018-09-13 DIAGNOSIS — Z8249 Family history of ischemic heart disease and other diseases of the circulatory system: Secondary | ICD-10-CM

## 2018-09-13 DIAGNOSIS — Z515 Encounter for palliative care: Secondary | ICD-10-CM | POA: Diagnosis not present

## 2018-09-13 DIAGNOSIS — J690 Pneumonitis due to inhalation of food and vomit: Secondary | ICD-10-CM | POA: Diagnosis present

## 2018-09-13 DIAGNOSIS — I10 Essential (primary) hypertension: Secondary | ICD-10-CM | POA: Diagnosis present

## 2018-09-13 DIAGNOSIS — T17908A Unspecified foreign body in respiratory tract, part unspecified causing other injury, initial encounter: Secondary | ICD-10-CM

## 2018-09-13 DIAGNOSIS — R296 Repeated falls: Secondary | ICD-10-CM | POA: Diagnosis present

## 2018-09-13 DIAGNOSIS — Z87440 Personal history of urinary (tract) infections: Secondary | ICD-10-CM

## 2018-09-13 DIAGNOSIS — Z66 Do not resuscitate: Secondary | ICD-10-CM | POA: Diagnosis not present

## 2018-09-13 DIAGNOSIS — E875 Hyperkalemia: Secondary | ICD-10-CM | POA: Diagnosis present

## 2018-09-13 DIAGNOSIS — Z79899 Other long term (current) drug therapy: Secondary | ICD-10-CM

## 2018-09-13 DIAGNOSIS — N3000 Acute cystitis without hematuria: Secondary | ICD-10-CM | POA: Diagnosis present

## 2018-09-13 DIAGNOSIS — F329 Major depressive disorder, single episode, unspecified: Secondary | ICD-10-CM | POA: Diagnosis present

## 2018-09-13 DIAGNOSIS — N3946 Mixed incontinence: Secondary | ICD-10-CM | POA: Diagnosis present

## 2018-09-13 DIAGNOSIS — Z7401 Bed confinement status: Secondary | ICD-10-CM

## 2018-09-13 DIAGNOSIS — N390 Urinary tract infection, site not specified: Secondary | ICD-10-CM

## 2018-09-13 DIAGNOSIS — Z7189 Other specified counseling: Secondary | ICD-10-CM | POA: Diagnosis not present

## 2018-09-13 DIAGNOSIS — R4182 Altered mental status, unspecified: Secondary | ICD-10-CM | POA: Diagnosis not present

## 2018-09-13 DIAGNOSIS — G9341 Metabolic encephalopathy: Secondary | ICD-10-CM | POA: Diagnosis present

## 2018-09-13 DIAGNOSIS — G40409 Other generalized epilepsy and epileptic syndromes, not intractable, without status epilepticus: Secondary | ICD-10-CM | POA: Diagnosis present

## 2018-09-13 DIAGNOSIS — F0390 Unspecified dementia without behavioral disturbance: Secondary | ICD-10-CM | POA: Diagnosis present

## 2018-09-13 DIAGNOSIS — I482 Chronic atrial fibrillation, unspecified: Secondary | ICD-10-CM | POA: Diagnosis present

## 2018-09-13 DIAGNOSIS — F419 Anxiety disorder, unspecified: Secondary | ICD-10-CM | POA: Diagnosis present

## 2018-09-13 DIAGNOSIS — Z7901 Long term (current) use of anticoagulants: Secondary | ICD-10-CM

## 2018-09-13 DIAGNOSIS — T83511A Infection and inflammatory reaction due to indwelling urethral catheter, initial encounter: Secondary | ICD-10-CM | POA: Diagnosis not present

## 2018-09-13 DIAGNOSIS — R569 Unspecified convulsions: Secondary | ICD-10-CM | POA: Diagnosis present

## 2018-09-13 DIAGNOSIS — F17210 Nicotine dependence, cigarettes, uncomplicated: Secondary | ICD-10-CM | POA: Diagnosis present

## 2018-09-13 DIAGNOSIS — Z8673 Personal history of transient ischemic attack (TIA), and cerebral infarction without residual deficits: Secondary | ICD-10-CM

## 2018-09-13 DIAGNOSIS — R0602 Shortness of breath: Secondary | ICD-10-CM

## 2018-09-13 LAB — CBC WITH DIFFERENTIAL/PLATELET
Abs Immature Granulocytes: 0.04 10*3/uL (ref 0.00–0.07)
Basophils Absolute: 0.1 10*3/uL (ref 0.0–0.1)
Basophils Relative: 1 %
EOS ABS: 0.2 10*3/uL (ref 0.0–0.5)
Eosinophils Relative: 1 %
HCT: 43.2 % (ref 36.0–46.0)
Hemoglobin: 13.5 g/dL (ref 12.0–15.0)
IMMATURE GRANULOCYTES: 0 %
Lymphocytes Relative: 11 %
Lymphs Abs: 1.6 10*3/uL (ref 0.7–4.0)
MCH: 30.4 pg (ref 26.0–34.0)
MCHC: 31.3 g/dL (ref 30.0–36.0)
MCV: 97.3 fL (ref 80.0–100.0)
Monocytes Absolute: 1 10*3/uL (ref 0.1–1.0)
Monocytes Relative: 7 %
Neutro Abs: 11.1 10*3/uL — ABNORMAL HIGH (ref 1.7–7.7)
Neutrophils Relative %: 80 %
Platelets: 284 10*3/uL (ref 150–400)
RBC: 4.44 MIL/uL (ref 3.87–5.11)
RDW: 16.1 % — ABNORMAL HIGH (ref 11.5–15.5)
WBC: 14 10*3/uL — ABNORMAL HIGH (ref 4.0–10.5)
nRBC: 0 % (ref 0.0–0.2)

## 2018-09-13 LAB — COMPREHENSIVE METABOLIC PANEL
ALT: 10 U/L (ref 0–44)
AST: 20 U/L (ref 15–41)
Albumin: 3.9 g/dL (ref 3.5–5.0)
Alkaline Phosphatase: 84 U/L (ref 38–126)
Anion gap: 10 (ref 5–15)
BILIRUBIN TOTAL: 0.6 mg/dL (ref 0.3–1.2)
BUN: 48 mg/dL — ABNORMAL HIGH (ref 8–23)
CO2: 25 mmol/L (ref 22–32)
Calcium: 9.4 mg/dL (ref 8.9–10.3)
Chloride: 105 mmol/L (ref 98–111)
Creatinine, Ser: 1.87 mg/dL — ABNORMAL HIGH (ref 0.44–1.00)
GFR calc Af Amer: 30 mL/min — ABNORMAL LOW (ref >60)
GFR calc non Af Amer: 26 mL/min — ABNORMAL LOW (ref >60)
Glucose, Bld: 109 mg/dL — ABNORMAL HIGH (ref 70–99)
Potassium: 5.2 mmol/L — ABNORMAL HIGH (ref 3.5–5.1)
Sodium: 140 mmol/L (ref 135–145)
TOTAL PROTEIN: 8.1 g/dL (ref 6.5–8.1)

## 2018-09-13 LAB — URINALYSIS, COMPLETE (UACMP) WITH MICROSCOPIC
Bilirubin Urine: NEGATIVE
Glucose, UA: NEGATIVE mg/dL
Ketones, ur: NEGATIVE mg/dL
Nitrite: NEGATIVE
Protein, ur: 100 mg/dL — AB
RBC / HPF: >50 RBC/hpf — ABNORMAL HIGH (ref 0–5)
SPECIFIC GRAVITY, URINE: 1.008 (ref 1.005–1.030)
WBC, UA: >50 WBC/hpf — ABNORMAL HIGH (ref 0–5)
pH: 7 (ref 5.0–8.0)

## 2018-09-13 MED ORDER — SODIUM CHLORIDE 0.9 % IV SOLN
1.0000 g | INTRAVENOUS | Status: DC
  Administered 2018-09-14: 17:00:00 1 g via INTRAVENOUS
  Filled 2018-09-13: qty 10
  Filled 2018-09-13: qty 1

## 2018-09-13 MED ORDER — SODIUM POLYSTYRENE SULFONATE 15 GM/60ML PO SUSP
15.0000 g | Freq: Once | ORAL | Status: DC
  Filled 2018-09-13: qty 60

## 2018-09-13 MED ORDER — ONDANSETRON HCL 4 MG/2ML IJ SOLN
4.0000 mg | Freq: Four times a day (QID) | INTRAMUSCULAR | Status: DC | PRN
  Administered 2018-09-15: 4 mg via INTRAVENOUS

## 2018-09-13 MED ORDER — SODIUM CHLORIDE 0.9 % IV SOLN
INTRAVENOUS | Status: DC
  Administered 2018-09-13: 20:00:00 via INTRAVENOUS

## 2018-09-13 MED ORDER — ACETAMINOPHEN 325 MG PO TABS: 650.0000 mg | ORAL_TABLET | Freq: Four times a day (QID) | ORAL | Status: DC | PRN

## 2018-09-13 MED ORDER — SODIUM CHLORIDE 0.9 % IV BOLUS
1000.0000 mL | Freq: Once | INTRAVENOUS | Status: AC
  Administered 2018-09-13: 1000 mL via INTRAVENOUS

## 2018-09-13 MED ORDER — LEVETIRACETAM IN NACL 500 MG/100ML IV SOLN
500.0000 mg | Freq: Two times a day (BID) | INTRAVENOUS | Status: DC
  Administered 2018-09-13 – 2018-09-16 (×6): 500 mg via INTRAVENOUS
  Filled 2018-09-13 (×8): qty 100

## 2018-09-13 MED ORDER — ONDANSETRON HCL 4 MG PO TABS: 4.0000 mg | ORAL_TABLET | Freq: Four times a day (QID) | ORAL | Status: DC | PRN

## 2018-09-13 MED ORDER — SODIUM CHLORIDE 0.9 % IV SOLN
1.0000 g | Freq: Once | INTRAVENOUS | Status: AC
  Administered 2018-09-13: 1 g via INTRAVENOUS
  Filled 2018-09-13: qty 10

## 2018-09-13 MED ORDER — ACETAMINOPHEN 650 MG RE SUPP: 650.0000 mg | Freq: Four times a day (QID) | RECTAL | Status: DC | PRN

## 2018-09-13 MED ORDER — PATIROMER SORBITEX CALCIUM 8.4 G PO PACK: 8.4000 g | PACK | Freq: Every day | ORAL | Status: DC

## 2018-09-13 MED ORDER — FLEET ENEMA 7-19 GM/118ML RE ENEM
1.0000 | ENEMA | Freq: Once | RECTAL | Status: AC
  Administered 2018-09-13: 1 via RECTAL

## 2018-09-13 MED ORDER — ALBUTEROL SULFATE (2.5 MG/3ML) 0.083% IN NEBU
2.5000 mg | INHALATION_SOLUTION | Freq: Once | RESPIRATORY_TRACT | Status: AC
  Administered 2018-09-14: 04:00:00 2.5 mg via RESPIRATORY_TRACT
  Filled 2018-09-13: qty 3

## 2018-09-13 MED ORDER — SODIUM POLYSTYRENE SULFONATE 15 GM/60ML PO SUSP
15.0000 g | Freq: Once | ORAL | Status: AC
  Administered 2018-09-14: 15 g via RECTAL

## 2018-09-13 NOTE — ED Notes (Signed)
RN unable to call report due to time restriction. Will attempt report once outside of time restriction window.

## 2018-09-13 NOTE — H&P (Signed)
[facility] - [place] at [facility]   PATIENT NAME: Destiny Mack    MR#:  [id number]  DATE OF BIRTH:  1945-10-20  DATE OF ADMISSION:  09/13/2018  PRIMARY CARE PHYSICIAN: Oswaldo Conroy, MD   REQUESTING/REFERRING PHYSICIAN:   CHIEF COMPLAINT:  Seizure, altered mental status  HISTORY OF PRESENT ILLNESS:  Destiny Mack  is a 72 y.o. female with a known history of seizure disorder, history of stroke , dementia, chronic atrial fibrillation on Eliquis, indwelling Foley catheter, recurrent UTIs was just admitted to the hospital few weeks ago with similar diagnosis of acute cystitis and seizures and got discharged After extensive neurological work-up with unclear etiology for her seizures. According to the patient's daughter patient was found to be unresponsive and EMS witnessed generalized tonic-clonic seizure.  Urinalysis looks abnormal and blood-tinged.  Patient is very lethargic but arousable to deep touch  PAST MEDICAL HISTORY:   Past Medical History:  Diagnosis Date  . A-fib (HCC)   . Depression   . Frequent falls   . Hypertension   . Leaking of urine   . Mixed incontinence   . Stroke (HCC)   . Tremor of hands and face     PAST SURGICAL HISTOIRY:   Past Surgical History:  Procedure Laterality Date  . APPENDECTOMY    . TEE WITHOUT CARDIOVERSION N/A 01/04/2018   Procedure: TRANSESOPHAGEAL ECHOCARDIOGRAM (TEE);  Surgeon: Lamar Blinks, MD;  Location: [facility] ORS;  Service: Cardiovascular;  Laterality: N/A;  . TUBAL LIGATION      SOCIAL HISTORY:   Social History   Tobacco Use  . Smoking status: Current Every Day Smoker    Packs/day: 2.00    Years: 56.00    Pack years: 112.00    Last attempt to quit: 11/22/2017    Years since quitting: 0.8  . Smokeless tobacco: Never Used  Substance Use Topics  . Alcohol use: No    Frequency: Never    FAMILY HISTORY:   Family History  Problem Relation Age of Onset  . Dementia Mother   . Heart failure  Mother   . Heart failure Father   . CAD Father     DRUG ALLERGIES:  No Known Allergies  REVIEW OF SYSTEMS:  Unobtainable review of system  MEDICATIONS AT HOME:   Prior to Admission medications   Medication Sig Start Date End Date Taking? Authorizing Provider  acetaminophen (TYLENOL) 500 MG tablet Take 500 mg by mouth 3 (three) times daily.   Yes [provider]  amLODipine (NORVASC) 5 MG tablet Take 5 mg by mouth daily.   Yes [provider]  apixaban (ELIQUIS) 5 MG TABS tablet Take 1 tablet (5 mg total) by mouth 2 (two) times daily. 12/15/17 09/13/18 Yes Pyreddy, Vivien Rota, MD  atorvastatin (LIPITOR) 20 MG tablet Take 20 mg by mouth at bedtime.    Yes [provider]  levETIRAcetam (KEPPRA) 500 MG tablet Take 1 tablet (500 mg total) by mouth 2 (two) times daily. 09/09/18 11/08/18 Yes Sainani, Rolly Pancake, MD  nicotine (NICODERM CQ - DOSED IN MG/24 HOURS) 21 mg/24hr patch Place 1 patch (21 mg total) onto the skin daily. 08/30/18  Yes Makiah Clauson, MD  senna-docusate (SENOKOT-S) 8.6-50 MG tablet Take 1 tablet by mouth at bedtime as needed for mild constipation. 08/30/18  Yes Amarionna Arca, Deanna Artis, MD  sertraline (ZOLOFT) 50 MG tablet Take 50 mg by mouth daily.   Yes [provider]      VITAL SIGNS:  Blood pressure Marland Kitchen)  143/63, pulse 80, temperature 97.9 F (36.6 C), temperature source Axillary, resp. rate 18, SpO2 98 %.  PHYSICAL EXAMINATION:  GENERAL:  73 y.o.-year-old patient lying in the bed with no acute distress.  EYES: Pupils equal, round, reactive to light and accommodation. No scleral icterus. Extraocular muscles intact.  HEENT: Head atraumatic, normocephalic. Oropharynx and nasopharynx clear.  NECK:  Supple, no jugular venous distention. No thyroid enlargement, no tenderness.  LUNGS: Normal breath sounds bilaterally, no wheezing, rales,rhonchi or crepitation. No use of accessory muscles of respiration.  CARDIOVASCULAR: S1, S2 normal. No murmurs, rubs, or  gallops.  ABDOMEN: Soft, nontender, nondistended. Bowel sounds present.  Chronic indwelling Foley catheter EXTREMITIES: No pedal edema, cyanosis, or clubbing.  NEUROLOGIC: Very lethargic but arousable to deep touch gait not checked.  PSYCHIATRIC: The patient is encephalopathic SKIN: No obvious rash, lesion, or ulcer.   LABORATORY PANEL:   CBC Recent Labs  Lab 09/13/18 1435  WBC 14.0*  HGB 13.5  HCT 43.2  PLT 284   ------------------------------------------------------------------------------------------------------------------  Chemistries  Recent Labs  Lab 09/13/18 1435  NA 140  K 5.2*  CL 105  CO2 25  GLUCOSE 109*  BUN 48*  CREATININE 1.87*  CALCIUM 9.4  AST 20  ALT 10  ALKPHOS 84  BILITOT 0.6   ------------------------------------------------------------------------------------------------------------------  Cardiac Enzymes No results for input(s): TROPONINI in the last 168 hours. ------------------------------------------------------------------------------------------------------------------  RADIOLOGY:  Ct Head Wo Contrast  Result Date: 09/13/2018 CLINICAL DATA:  Altered mental status EXAM: CT HEAD WITHOUT CONTRAST TECHNIQUE: Contiguous axial images were obtained from the base of the skull through the vertex without intravenous contrast. COMPARISON:  09/06/2018 FINDINGS: Brain: No evidence of acute infarction, hemorrhage, extra-axial collection, ventriculomegaly, or mass effect. Generalized cerebral atrophy. Periventricular white matter low attenuation likely secondary to microangiopathy. Vascular: Cerebrovascular atherosclerotic calcifications are noted. Skull: Negative for fracture or focal lesion. Sinuses/Orbits: Visualized portions of the orbits are unremarkable. Mastoid sinuses are clear. Air-fluid level in the left sphenoid sinus. Other: None. IMPRESSION: 1. No acute intracranial pathology. 2. Chronic microvascular disease and cerebral atrophy. Electronically  Signed   By: Elige Ko   On: 09/13/2018 17:26   Dg Chest Portable 1 View  Result Date: 09/13/2018 CLINICAL DATA:  Patient found unresponsive today. EXAM: PORTABLE CHEST 1 VIEW COMPARISON:  PA and lateral chest 09/04/2018. Single-view of the chest 10/29/2017. FINDINGS: The lungs are clear. Heart size is normal. Aortic atherosclerosis noted. No pneumothorax or pleural effusion. No acute bony abnormality. Remote right sixth and seventh rib fracture identified. Remote left clavicle fracture also noted. IMPRESSION: No acute disease. Atherosclerosis. Electronically Signed   By: Drusilla Kanner M.D.   On: 09/13/2018 14:26    EKG:   Orders placed or performed during the hospital encounter of 09/13/18  . ED EKG  . ED EKG    IMPRESSION AND PLAN:    #Acute encephalopathy on chronic dementia- secondary to UTI ,postictal state  Admit to MedSurg unit Neurochecks Seizure precautions N.p.o.  #Acute cystitis IV Rocephin and hydrate with IV fluids and follow-up on the urine culture and sensitivity During previous admission urine culture has revealed yeast  #Acute on chronic seizures Patient was just discharged from the hospital on 09/09/2018 and all her work-up which was done extensively including CT head, MRI of the brain were negative her EEG was also negative for seizures. Neurology consult placed, text message sent via haiku, not seen by the specialist please follow-up in a.m. Continue Keppra 500 mg IV every 12 hours Keppra level pending  #  Chronic atrial fibrillation rate controlled Currently patient is n.p.o. as she is encephalopathic and hold Eliquis in view of hematuria  #Hyperkalemia Kayexalate.  Check a.m. labs  #History of previous CVA Currently Eliquis is on hold in view of hematuria and encephalopathy Patient being n.p.o. hold statin as well    All the records are reviewed and case discussed with ED provider. Management plans discussed with the patient's daughter she is in  agreement with the current plan of care.  All her questions were answered to her satisfaction  CODE STATUS: fc   TOTAL TIME TAKING CARE OF THIS PATIENT: 43  minutes.   Note: This dictation was prepared with Dragon dictation along with smaller phrase technology. Any transcriptional errors that result from this process are unintentional.  Ramonita LabAruna George Haggart M.D on 09/13/2018 at 6:48 PM  Between 7am to 6pm - Pager - [phone]  After 6pm go to [email or web address] - password [email or web address] [facility]  [facility]  Office  [phone]  CC: Primary care physician; Oswaldo ConroyBender, [name], MD

## 2018-09-13 NOTE — ED Triage Notes (Signed)
Pt to ED by EMS. Daughter called due to pt being unresponsive. Per EMS pt appeared to have seizure like activity. Pt has indwelling cath with blood present in urine. Pt has congested cough r/t to possible aspiration. Pt has hx of seizures and frequent UTI's.

## 2018-09-13 NOTE — ED Provider Notes (Signed)
[facility] Emergency Department Provider Note  ____________________________________________  Time seen: Approximately 2:21 PM  I have reviewed the triage vital signs and the nursing notes.   HISTORY  Chief Complaint Altered Mental Status  Level 5 caveat:  Portions of the history and physical were unable to be obtained due to AMS   HPI Destiny Mack is a 73 y.o. female with a history of seizure disorder, stroke, dementia, A. fib on Eliquis, hypertension, indwelling Foley catheter, recurrent UTIs who presents for evaluation of altered mental status.  Patient is coming from home.  According to EMS, daughter called when she found patient unresponsive.  When EMS arrived at the experience a generalized tonic-clonic seizure.  Patient is currently postictal unable to provide any history.   Past Medical History:  Diagnosis Date  . A-fib (HCC)   . Depression   . Frequent falls   . Hypertension   . Leaking of urine   . Mixed incontinence   . Stroke (HCC)   . Tremor of hands and face     Patient Active Problem List   Diagnosis Date Noted  . Seizure (HCC) 09/06/2018  . Renal failure 08/28/2018  . Syncope 07/06/2018  . Acute lower UTI 07/06/2018  . UTI (urinary tract infection) 07/06/2018  . Sepsis (HCC) 06/24/2018  . CVA (cerebral vascular accident) (HCC) 05/20/2018  . Malnutrition of moderate degree 01/01/2018  . Pneumonia 12/31/2017  . Hypotension 12/13/2017  . Diarrhea 11/22/2017  . Campylobacter diarrhea 11/22/2017  . Acute encephalopathy 10/29/2017  . Pressure injury of skin 10/29/2017    Past Surgical History:  Procedure Laterality Date  . APPENDECTOMY    . TEE WITHOUT CARDIOVERSION N/A 01/04/2018   Procedure: TRANSESOPHAGEAL ECHOCARDIOGRAM (TEE);  Surgeon: Lamar Blinks, MD;  Location: [facility] ORS;  Service: Cardiovascular;  Laterality: N/A;  . TUBAL LIGATION      Prior to Admission medications   Medication Sig Start Date End Date  Taking? Authorizing Provider  amLODipine (NORVASC) 5 MG tablet Take 5 mg by mouth daily.    [provider]  apixaban (ELIQUIS) 5 MG TABS tablet Take 1 tablet (5 mg total) by mouth 2 (two) times daily. 12/15/17 09/06/18  Ihor Austin, MD  atorvastatin (LIPITOR) 20 MG tablet Take 20 mg by mouth at bedtime.     [provider]  levETIRAcetam (KEPPRA) 500 MG tablet Take 1 tablet (500 mg total) by mouth 2 (two) times daily. 09/09/18 11/08/18  Houston Siren, MD  nicotine (NICODERM CQ - DOSED IN MG/24 HOURS) 21 mg/24hr patch Place 1 patch (21 mg total) onto the skin daily. 08/30/18   Gouru, Deanna Artis, MD  senna-docusate (SENOKOT-S) 8.6-50 MG tablet Take 1 tablet by mouth at bedtime as needed for mild constipation. 08/30/18   Ramonita Lab, MD    Allergies Patient has no known allergies.  Family History  Problem Relation Age of Onset  . Dementia Mother   . Heart failure Mother   . Heart failure Father   . CAD Father     Social History Social History   Tobacco Use  . Smoking status: Current Every Day Smoker    Packs/day: 2.00    Years: 56.00    Pack years: 112.00    Last attempt to quit: 11/22/2017    Years since quitting: 0.8  . Smokeless tobacco: Never Used  Substance Use Topics  . Alcohol use: No    Frequency: Never  . Drug use: No    Review of Systems  Constitutional: Negative for fever. + AMS Neurological:+ seizure  ____________________________________________   PHYSICAL EXAM:  VITAL SIGNS: ED Triage Vitals  Enc Vitals Group     BP 09/13/18 1357 129/66     Pulse --      Resp 09/13/18 1353 11     Temp 09/13/18 1353 97.9 F (36.6 C)     Temp Source 09/13/18 1353 Axillary     SpO2 09/13/18 1350 97 %     Weight --      Height --      Head Circumference --      Peak Flow --      Pain Score 09/13/18 1358 0     Pain Loc --      Pain Edu? --      Excl. in GC? --     Constitutional: Sleeping, snoring, no distress HEENT:      Head: Normocephalic and  atraumatic.         Eyes: Conjunctivae are normal. Sclera is non-icteric.       Mouth/Throat: Mucous membranes are moist.       Neck: Supple with no signs of meningismus. Cardiovascular: Regular rate and rhythm. No murmurs, gallops, or rubs. 2+ symmetrical distal pulses are present in all extremities. No JVD. Respiratory: Normal respiratory effort. Lungs are clear to auscultation bilaterally. No wheezes, crackles, or rhonchi.  Gastrointestinal: Soft, non tender, and non distended with positive bowel sounds. No rebound or guarding. Musculoskeletal: Nontender with normal range of motion in all extremities. No edema, cyanosis, or erythema of extremities. Neurologic: Patient is sleeping, opens eyes and withdraws x4 from noxious stimuli, not answering any questions or following commands Skin: Skin is warm, dry and intact. No rash noted.   ____________________________________________   LABS (all labs ordered are listed, but only abnormal results are displayed)  Labs Reviewed  CBC WITH DIFFERENTIAL/PLATELET - Abnormal; Notable for the following components:      Result Value   WBC 14.0 (*)    RDW 16.1 (*)    Neutro Abs 11.1 (*)    All other components within normal limits  COMPREHENSIVE METABOLIC PANEL - Abnormal; Notable for the following components:   Potassium 5.2 (*)    Glucose, Bld 109 (*)    BUN 48 (*)    Creatinine, Ser 1.87 (*)    GFR calc non Af Amer 26 (*)    GFR calc Af Amer 30 (*)    All other components within normal limits  URINALYSIS, COMPLETE (UACMP) WITH MICROSCOPIC - Abnormal; Notable for the following components:   Color, Urine PINK (*)    APPearance HAZY (*)    Hgb urine dipstick LARGE (*)    Protein, ur 100 (*)    Leukocytes, UA LARGE (*)    RBC / HPF >50 (*)    WBC, UA >50 (*)    Bacteria, UA RARE (*)    Non Squamous Epithelial PRESENT (*)    All other components within normal limits  URINE CULTURE  LEVETIRACETAM LEVEL    ____________________________________________  EKG  ED ECG REPORT I, Nita Sicklearolina Dasia Guerrier, the attending physician, personally viewed and interpreted this ECG.  Normal sinus rhythm, rate of 83, normal intervals, normal axis, minimal ST depressions on lateral leads but no ST elevations.  Unchanged from prior. ____________________________________________  RADIOLOGY  I have personally reviewed the images performed during this visit and I agree with the Radiologist's read.   Interpretation by Radiologist:  Ct Head Wo Contrast  Result Date: 09/13/2018 CLINICAL  DATA:  Altered mental status EXAM: CT HEAD WITHOUT CONTRAST TECHNIQUE: Contiguous axial images were obtained from the base of the skull through the vertex without intravenous contrast. COMPARISON:  09/06/2018 FINDINGS: Brain: No evidence of acute infarction, hemorrhage, extra-axial collection, ventriculomegaly, or mass effect. Generalized cerebral atrophy. Periventricular white matter low attenuation likely secondary to microangiopathy. Vascular: Cerebrovascular atherosclerotic calcifications are noted. Skull: Negative for fracture or focal lesion. Sinuses/Orbits: Visualized portions of the orbits are unremarkable. Mastoid sinuses are clear. Air-fluid level in the left sphenoid sinus. Other: None. IMPRESSION: 1. No acute intracranial pathology. 2. Chronic microvascular disease and cerebral atrophy. Electronically Signed   By: Elige KoHetal  Patel   On: 09/13/2018 17:26   Dg Chest Portable 1 View  Result Date: 09/13/2018 CLINICAL DATA:  Patient found unresponsive today. EXAM: PORTABLE CHEST 1 VIEW COMPARISON:  PA and lateral chest 09/04/2018. Single-view of the chest 10/29/2017. FINDINGS: The lungs are clear. Heart size is normal. Aortic atherosclerosis noted. No pneumothorax or pleural effusion. No acute bony abnormality. Remote right sixth and seventh rib fracture identified. Remote left clavicle fracture also noted. IMPRESSION: No acute disease.  Atherosclerosis. Electronically Signed   By: Drusilla Kannerhomas  Dalessio M.D.   On: 09/13/2018 14:26     ____________________________________________   PROCEDURES  Procedure(s) performed: None Procedures Critical Care performed:  None ____________________________________________   INITIAL IMPRESSION / ASSESSMENT AND PLAN / ED COURSE  73 y.o. female with a history of seizure disorder, stroke, dementia, A. fib on Eliquis, hypertension, indwelling Foley catheter, recurrent UTIs who presents for evaluation of altered mental status.  Per EMS they witnessed a generalized tonic-clonic seizure.  Patient was recently in the hospital a week ago for similar presentation and had a negative MRI, EEG, head CT.  She was started on Keppra and her altered mental status cleared.  She is currently on Keppra.  She has an indwelling Foley catheter with hematuria in the bag.  She is hemodynamically stable.  Her face is symmetric.  She opens her eyes and withdraws x4 from noxious stimuli.  She also has a very productive cough.  Based on vitals no signs of sepsis at this time.  We will do a chest x-ray, basic labs, urinalysis, will send a Keppra level.  Ddx post-ictal from seizure vs sepsis versus head bleed.  Clinical Course as of Sep 13 1749  Fri Sep 13, 2018  1701 Patient still not back to baseline, very somnolent.  UA is positive for UTI.  Slightly elevated white count of 14 but no other signs of sepsis.  We will send patient for head CT.   [CV]    Clinical Course User Index [CV] Nita SickleVeronese, Maurice, MD   _________________________ 5:41 PM on 09/13/2018 -----------------------------------------  CT head negative.  Patient remains altered will admit to the hospitalist service.    As part of my medical decision making, I reviewed the following data within the electronic MEDICAL RECORD NUMBER Nursing notes reviewed and incorporated, Labs reviewed , EKG interpreted , Old EKG reviewed, Old chart reviewed, Radiograph reviewed  , Discussed with admitting physician , Notes from prior ED visits and [place] Controlled Substance Database    Pertinent labs & imaging results that were available during my care of the patient were reviewed by me and considered in my medical decision making (see chart for details).    ____________________________________________   FINAL CLINICAL IMPRESSION(S) / ED DIAGNOSES  Final diagnoses:  Altered mental status, unspecified altered mental status type  Seizure (HCC)  Urinary tract infection associated with  indwelling urethral catheter, initial encounter (HCC)      NEW MEDICATIONS STARTED DURING THIS VISIT:  ED Discharge Orders    None       Note:  This document was prepared using Dragon voice recognition software and may include unintentional dictation errors.    Don Perking, [place], MD 09/13/18 [phone]

## 2018-09-13 NOTE — Progress Notes (Signed)
Family Meeting Note  Advance Directive:yes  Today a meeting took place with the Patient's daughter   Patient is unable to participate due [id number] capacity Encephalopathic and demented   The following clinical team members were present during this meeting:MD  The following were discussed:Patient's diagnosis: Acute encephalopathy on chronic dementia, acute cystitis, recurrent seizures, postictal state, acute cystitis, chronic atrial fibrillation, hematuria, history of stroke, treatment plan of care discussed in detail with the patient's daughter.  She verbalized understanding of the plan   patient's progosis: Unable to determine and Goals for treatment: Full Code  Additional follow-up to be provided: Hospitalist, neurology  Time spent during discussion:17 min  Ramonita Lab, MD

## 2018-09-14 LAB — URINE CULTURE: Culture: NO GROWTH

## 2018-09-14 LAB — COMPREHENSIVE METABOLIC PANEL
ALK PHOS: 73 U/L (ref 38–126)
ALT: 8 U/L (ref 0–44)
AST: 15 U/L (ref 15–41)
Albumin: 3.2 g/dL — ABNORMAL LOW (ref 3.5–5.0)
Anion gap: 7 (ref 5–15)
BUN: 43 mg/dL — ABNORMAL HIGH (ref 8–23)
CALCIUM: 8.4 mg/dL — AB (ref 8.9–10.3)
CO2: 25 mmol/L (ref 22–32)
Chloride: 109 mmol/L (ref 98–111)
Creatinine, Ser: 1.71 mg/dL — ABNORMAL HIGH (ref 0.44–1.00)
GFR calc non Af Amer: 29 mL/min — ABNORMAL LOW (ref >60)
GFR, EST AFRICAN AMERICAN: 34 mL/min — AB (ref >60)
Glucose, Bld: 149 mg/dL — ABNORMAL HIGH (ref 70–99)
Potassium: 4.6 mmol/L (ref 3.5–5.1)
Sodium: 141 mmol/L (ref 135–145)
Total Bilirubin: 0.5 mg/dL (ref 0.3–1.2)
Total Protein: 6.6 g/dL (ref 6.5–8.1)

## 2018-09-14 LAB — BLOOD GAS, ARTERIAL
Acid-Base Excess: 3.3 mmol/L — ABNORMAL HIGH (ref 0.0–2.0)
Bicarbonate: 27.8 mmol/L (ref 20.0–28.0)
FIO2: 0.24
O2 Saturation: 97.7 %
Patient temperature: 37
pCO2 arterial: 41 mmHg (ref 32.0–48.0)
pH, Arterial: 7.44 (ref 7.350–7.450)
pO2, Arterial: 96 mmHg (ref 83.0–108.0)

## 2018-09-14 LAB — GLUCOSE, CAPILLARY
Glucose-Capillary: 132 mg/dL — ABNORMAL HIGH (ref 70–99)
Glucose-Capillary: 135 mg/dL — ABNORMAL HIGH (ref 70–99)
Glucose-Capillary: 83 mg/dL (ref 70–99)
Glucose-Capillary: 85 mg/dL (ref 70–99)

## 2018-09-14 LAB — CBC
HCT: 35.1 % — ABNORMAL LOW (ref 36.0–46.0)
Hemoglobin: 11 g/dL — ABNORMAL LOW (ref 12.0–15.0)
MCH: 30.1 pg (ref 26.0–34.0)
MCHC: 31.3 g/dL (ref 30.0–36.0)
MCV: 96.2 fL (ref 80.0–100.0)
Platelets: 213 10*3/uL (ref 150–400)
RBC: 3.65 MIL/uL — AB (ref 3.87–5.11)
RDW: 15.8 % — ABNORMAL HIGH (ref 11.5–15.5)
WBC: 11.7 10*3/uL — ABNORMAL HIGH (ref 4.0–10.5)
nRBC: 0 % (ref 0.0–0.2)

## 2018-09-14 LAB — VITAMIN B12: Vitamin B-12: 480 pg/mL (ref 180–914)

## 2018-09-14 LAB — AMMONIA: AMMONIA: 16 umol/L (ref 9–35)

## 2018-09-14 MED ORDER — FUROSEMIDE 10 MG/ML IJ SOLN
40.0000 mg | Freq: Once | INTRAMUSCULAR | Status: AC
  Administered 2018-09-14: 01:00:00 40 mg via INTRAVENOUS
  Filled 2018-09-14: qty 4

## 2018-09-14 MED ORDER — MODAFINIL 100 MG PO TABS: 100.0000 mg | ORAL_TABLET | Freq: Every day | ORAL | Status: DC

## 2018-09-14 MED ORDER — SODIUM CHLORIDE 0.9 % IV SOLN
INTRAVENOUS | Status: DC | PRN
  Administered 2018-09-14: 12:00:00 1000 mL via INTRAVENOUS
  Administered 2018-09-16 – 2018-09-18 (×2): 250 mL via INTRAVENOUS
  Administered 2018-09-18 (×2): 500 mL via INTRAVENOUS

## 2018-09-14 MED ORDER — CHLORHEXIDINE GLUCONATE 0.12 % MT SOLN
15.0000 mL | Freq: Two times a day (BID) | OROMUCOSAL | Status: DC
  Administered 2018-09-14 – 2018-09-19 (×11): 15 mL via OROMUCOSAL
  Filled 2018-09-14 (×8): qty 15

## 2018-09-14 MED ORDER — SODIUM CHLORIDE 0.9 % IV SOLN
200.0000 mg | Freq: Two times a day (BID) | INTRAVENOUS | Status: DC
  Administered 2018-09-14 – 2018-09-19 (×11): 200 mg via INTRAVENOUS
  Filled 2018-09-14 (×16): qty 20

## 2018-09-14 MED ORDER — DEXTROSE-NACL 5-0.45 % IV SOLN
INTRAVENOUS | Status: DC
  Administered 2018-09-14 – 2018-09-19 (×9): via INTRAVENOUS

## 2018-09-14 MED ORDER — ORAL CARE MOUTH RINSE
15.0000 mL | Freq: Two times a day (BID) | OROMUCOSAL | Status: DC
  Administered 2018-09-14 – 2018-09-18 (×6): 15 mL via OROMUCOSAL

## 2018-09-14 NOTE — Plan of Care (Signed)
  Problem: Education: Goal: Knowledge of General Education information will improve Description Including pain rating scale, medication(s)/side effects and non-pharmacologic comfort measures Outcome: Progressing   Problem: Health Behavior/Discharge Planning: Goal: Ability to manage health-related needs will improve Outcome: Progressing   Problem: Clinical Measurements: Goal: Ability to maintain clinical measurements within normal limits will improve Outcome: Progressing Goal: Will remain free from infection Outcome: Progressing Goal: Diagnostic test results will improve Outcome: Progressing Goal: Respiratory complications will improve Outcome: Progressing Goal: Cardiovascular complication will be avoided Outcome: Progressing   Problem: Activity: Goal: Risk for activity intolerance will decrease Outcome: Progressing   Problem: Nutrition: Goal: Adequate nutrition will be maintained Outcome: Not Progressing   Problem: Coping: Goal: Level of anxiety will decrease Outcome: Progressing   Problem: Elimination: Goal: Will not experience complications related to bowel motility Outcome: Progressing Goal: Will not experience complications related to urinary retention Outcome: Progressing   Problem: Pain Managment: Goal: General experience of comfort will improve Outcome: Progressing   Problem: Safety: Goal: Ability to remain free from injury will improve Outcome: Not Progressing   Problem: Skin Integrity: Goal: Risk for impaired skin integrity will decrease Outcome: Not Progressing

## 2018-09-14 NOTE — Progress Notes (Signed)
Pt responsive to voice, very lethargic. 2 enemas given with 1 bowel movement.  New indwelling foley catheter placed and urine output is yellow. Will continue to monitor responsiveness.

## 2018-09-14 NOTE — Progress Notes (Signed)
[employer] [facility] [place] [facility]                                                                                                                                                                                  Patient Demographics   Destiny CharonLinda Mack, is a 73 y.o. female, DOB - 19-Jul-1946, ZOX:[id number]  Admit date - 09/13/2018   Admitting Physician Destiny LabAruna Gouru, MD  Outpatient Primary MD for the patient is Destiny Mack, Destiny LagosAbby Daneele, MD   LOS - 1  Subjective: Patient was recently hospitalized with similar presentation admitted with similar type of presentation Now readmitted with tonic-clonic seizure   Review of Systems:   CONSTITUTIONAL: Patient confused with dementia Vitals:   Vitals:   09/13/18 1900 09/13/18 1910 09/13/18 1959 09/14/18 0602  BP: (!) 146/54  (!) 140/48 124/78  Pulse: 69 76 67 70  Resp: 10 11 18 17   Temp:   (!) 97.3 F (36.3 C) 98.9 F (37.2 C)  TempSrc:    Axillary  SpO2: 95% 95% 100% 99%    Wt Readings from Last 3 Encounters:  09/08/18 61.3 kg  09/04/18 58.1 kg  07/19/18 64 kg     Intake/Output Summary (Last 24 hours) at 09/14/2018 1147 Last data filed at 09/14/2018 1100 Gross per 24 hour  Intake 1324.74 ml  Output 2 ml  Net 1322.74 ml    Physical Exam:   GENERAL: Chronically ill-appearing HEAD, EYES, EARS, NOSE AND THROAT: Atraumatic, normocephalic. Extraocular muscles are intact. Pupils equal and reactive to light. Sclerae anicteric. No conjunctival injection. No oro-pharyngeal erythema.  NECK: Supple. There is no jugular venous distention. No bruits, no lymphadenopathy, no thyromegaly.  HEART: Regular rate and rhythm,. No murmurs, no rubs, no clicks.  LUNGS: Clear to auscultation bilaterally. No rales or rhonchi. No wheezes.  ABDOMEN: Soft, flat, nontender, nondistended. Has good bowel sounds. No hepatosplenomegaly appreciated.  EXTREMITIES: No evidence of any cyanosis, clubbing, or peripheral edema.  +2 pedal and radial pulses  bilaterally.  NEUROLOGIC: Patient confused  sKIN: Moist and warm with no rashes appreciated.  Psych: Not anxious, depressed LN: No inguinal LN enlargement    Antibiotics   Anti-infectives (From admission, onward)   Start     Dose/Rate Route Frequency Ordered Stop   09/14/18 1800  cefTRIAXone (ROCEPHIN) 1 g in sodium chloride 0.9 % 100 mL IVPB     1 g 200 mL/hr over 30 Minutes Intravenous Every 24 hours 09/13/18 1812     09/13/18 1630  cefTRIAXone (ROCEPHIN) 1 g in sodium chloride 0.9 % 100 mL IVPB     1 g 200 mL/hr over 30 Minutes Intravenous  Once 09/13/18 1618 09/13/18 1827  Medications   Scheduled Meds: . chlorhexidine  15 mL Mouth Rinse BID  . mouth rinse  15 mL Mouth Rinse q12n4p  . modafinil  100 mg Oral Daily   Continuous Infusions: . sodium chloride 1,000 mL (09/14/18 1146)  . cefTRIAXone (ROCEPHIN)  IV    . dextrose 5 % and 0.45% NaCl Stopped (09/14/18 0932)  . lacosamide (VIMPAT) IV Stopped (09/14/18 1019)  . levETIRAcetam Stopped (09/13/18 2140)   PRN Meds:.sodium chloride, acetaminophen **OR** acetaminophen, ondansetron **OR** ondansetron (ZOFRAN) IV   Data Review:   Micro Results Recent Results (from the past 240 hour(s))  Urine culture     Status: Abnormal   Collection Time: 09/04/18  4:10 PM  Result Value Ref Range Status   Specimen Description   Final    URINE, RANDOM Performed at [facility], [address] [place], [place] [address]    Special Requests   Final    NONE Performed at [facility], [address] [place], [place] [address]    Culture >=100,000 COLONIES/mL YEAST (A)  Final   Report Status 09/06/2018 FINAL  Final  Urine culture     Status: Abnormal   Collection Time: 09/06/18 12:53 AM  Result Value Ref Range Status   Specimen Description URINE, RANDOM  Final   Special Requests   Final    NONE Performed at [facility], [address], [place], [place] [address]    Culture 10,000 COLONIES/mL  YEAST (A)  Final   Report Status 09/07/2018 FINAL  Final    Radiology Reports Ct Angio Head W Or Wo Contrast  Result Date: 09/06/2018 CLINICAL DATA:  73 y/o F; seizure-like activity. Focal neural deficit. Code stroke. EXAM: CT HEAD WITHOUT CONTRAST CT ANGIOGRAPHY HEAD AND NECK CT PERFUSION BRAIN TECHNIQUE: Multidetector CT imaging of the head and neck was performed using the standard protocol during bolus administration of intravenous contrast. Multiplanar CT image reconstructions and MIPs were obtained to evaluate the vascular anatomy. Carotid stenosis measurements (when applicable) are obtained utilizing NASCET criteria, using the distal internal carotid diameter as the denominator. Multiphase CT imaging of the brain was performed following IV bolus contrast injection. Subsequent parametric perfusion maps were calculated using RAPID software. CONTRAST:  ISOVUE-370 IOPAMIDOL (ISOVUE-370) INJECTION 76% COMPARISON:  07/19/2018 CT head. 07/09/2018 MRI head. FINDINGS: CT HEAD FINDINGS Brain: No evidence of acute infarction, hemorrhage, hydrocephalus, extra-axial collection or mass lesion/mass effect. 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 Stable chronic microvascular ischemic changes and volume loss of the brain. Vascular: As below. Skull: Normal. Negative for fracture or focal lesion. Sinuses/Orbits: Small fluid levels within the paranasal sinuses. Normal aeration of mastoid air cells. Orbits are unremarkable. Other: None. ASPECTS (Alberta Stroke Program Early CT Score) - Ganglionic level infarction (caudate, lentiform nuclei, internal capsule, insula, M1-M3 cortex): 7 - Supraganglionic infarction (M4-M6 cortex): 3 Total score (0-10 with 10 being normal): 10 Review of the MIP images confirms the above findings CTA NECK FINDINGS Aortic arch: Standard branching. Imaged portion shows no evidence of aneurysm or dissection. No significant  stenosis of the major arch vessel origins. Moderate mixed plaque of the aortic arch. Right carotid system: No evidence of dissection, stenosis (50% or greater) or occlusion. Predominant fibrofatty plaque of the carotid bifurcation with minimal less than 30% proximal ICA stenosis. Left carotid system: No evidence of dissection, stenosis (50% or greater) or occlusion. Predominant fibrofatty plaque of the carotid bifurcation with mild less than  50% proximal ICA stenosis. Mild nonstenotic beaded irregularity of the left mid cervical ICA. Vertebral arteries: Codominant. No evidence of dissection, stenosis (50% or greater) or occlusion. Segments of non stenotic beaded irregularity of the vertebral arteries. Skeleton: Moderate severe spondylosis of the cervical spine. Multilevel uncovertebral and facet hypertrophy with bony foraminal encroachment. Spinal canal stenosis is greatest at the C6-7 level where it is mild-to-moderate. Grade 1 C7-T1 anterolisthesis. Other neck: Negative. Upper chest: Mild centrilobular emphysema of the lung apices. Review of the MIP images confirms the above findings CTA HEAD FINDINGS Anterior circulation: No significant stenosis, proximal occlusion, aneurysm, or vascular malformation. Non stenotic calcific atherosclerosis of carotid siphons. Posterior circulation: No significant stenosis, proximal occlusion, aneurysm, or vascular malformation. Mild right P2 stenosis. Venous sinuses: As permitted by contrast timing, patent. Anatomic variants: Complete circle-of-Willis. Right fetal PCA. Review of the MIP images confirms the above findings CT Brain Perfusion Findings: CBF (<30%) Volume: 51mL Perfusion (Tmax>6.0s) volume: 50mL Mismatch Volume: 50mL Infarction Location:Negative. IMPRESSION: CT head: 1. No acute intracranial abnormality identified.  ASPECTS is 10. 2. Stable chronic microvascular ischemic changes and volume loss of the brain. 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 CT perfusion head: Negative CT perfusion head. No acute stroke or ischemic penumbra by RAPID perfusion criteria. CTA neck: 1. Patent carotid and vertebral arteries. No dissection, aneurysm, or high-grade stenosis by NASCET criteria. 2. Predominant fibrofatty plaque of bilateral carotid bifurcations with mild less than 50% proximal ICA stenosis. 3. 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 4. Moderate severe spondylosis of the cervical spine greatest at C6-7. CTA head: Patent anterior and posterior intracranial circulation. No large vessel occlusion, aneurysm, or significant stenosis is identified. These results were called by telephone at the time of interpretation on 09/06/2018 at 1:06 am to Dr. Bayard Males , who verbally acknowledged these results. Electronically Signed   By: Mitzi Hansen M.D.   On: 09/06/2018 01:09   Dg Chest 2 View  Result Date: 09/04/2018 CLINICAL DATA:  Altered mental status EXAM: CHEST - 2 VIEW COMPARISON:  07/19/2018 FINDINGS: The heart size and mediastinal contours are within normal limits. Both lungs are clear. Old left clavicle fracture. IMPRESSION: No active cardiopulmonary disease. Electronically Signed   By: Jasmine Pang M.D.   On: 09/04/2018 16:26   Ct Head Wo Contrast  Result Date: 09/13/2018 CLINICAL DATA:  Altered mental status EXAM: CT HEAD WITHOUT CONTRAST TECHNIQUE: Contiguous axial images were obtained from the base of the skull through the vertex without intravenous contrast. COMPARISON:  09/06/2018 FINDINGS: Brain: No evidence of acute infarction, hemorrhage, extra-axial collection, ventriculomegaly, or mass effect. Generalized cerebral atrophy. Periventricular white matter low attenuation likely secondary to microangiopathy. Vascular: Cerebrovascular atherosclerotic calcifications are noted. Skull: Negative for fracture or focal lesion. Sinuses/Orbits: Visualized portions  of the orbits are unremarkable. Mastoid sinuses are clear. Air-fluid level in the left sphenoid sinus. Other: None. IMPRESSION: 1. No acute intracranial pathology. 2. Chronic microvascular disease and cerebral atrophy. Electronically Signed   By: Elige Ko   On: 09/13/2018 17:26   Ct Angio Neck W Or Wo Contrast  Result Date: 09/06/2018 CLINICAL DATA:  73 y/o F; seizure-like activity. Focal neural deficit. Code stroke. EXAM: CT HEAD WITHOUT CONTRAST CT ANGIOGRAPHY HEAD AND NECK CT PERFUSION BRAIN TECHNIQUE: Multidetector CT imaging of the head and neck was performed using the standard protocol during bolus administration of intravenous contrast. Multiplanar CT image reconstructions and MIPs were obtained  to evaluate the vascular anatomy. Carotid stenosis measurements (when applicable) are obtained utilizing NASCET criteria, using the distal internal carotid diameter as the denominator. Multiphase CT imaging of the brain was performed following IV bolus contrast injection. Subsequent parametric perfusion maps were calculated using RAPID software. CONTRAST:  100mL ISOVUE-370 IOPAMIDOL (ISOVUE-370) INJECTION 76% COMPARISON:  07/19/2018 CT head. 07/09/2018 MRI head. FINDINGS: CT HEAD FINDINGS Brain: No evidence of acute infarction, hemorrhage, hydrocephalus, extra-axial collection or mass lesion/mass effect. 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 Stable chronic microvascular ischemic changes and volume loss of the brain. Vascular: As below. Skull: Normal. Negative for fracture or focal lesion. Sinuses/Orbits: Small fluid levels within the paranasal sinuses. Normal aeration of mastoid air cells. Orbits are unremarkable. Other: None. ASPECTS (Alberta Stroke Program Early CT Score) - Ganglionic level infarction (caudate, lentiform nuclei, internal capsule, insula, M1-M3 cortex): 7 - Supraganglionic infarction (M4-M6 cortex): 3 Total score (0-10 with  10 being normal): 10 Review of the MIP images confirms the above findings CTA NECK FINDINGS Aortic arch: Standard branching. Imaged portion shows no evidence of aneurysm or dissection. No significant stenosis of the major arch vessel origins. Moderate mixed plaque of the aortic arch. Right carotid system: No evidence of dissection, stenosis (50% or greater) or occlusion. Predominant fibrofatty plaque of the carotid bifurcation with minimal less than 30% proximal ICA stenosis. Left carotid system: No evidence of dissection, stenosis (50% or greater) or occlusion. Predominant fibrofatty plaque of the carotid bifurcation with mild less than 50% proximal ICA stenosis. Mild nonstenotic beaded irregularity of the left mid cervical ICA. Vertebral arteries: Codominant. No evidence of dissection, stenosis (50% or greater) or occlusion. Segments of non stenotic beaded irregularity of the vertebral arteries. Skeleton: Moderate severe spondylosis of the cervical spine. Multilevel uncovertebral and facet hypertrophy with bony foraminal encroachment. Spinal canal stenosis is greatest at the C6-7 level where it is mild-to-moderate. Grade 1 C7-T1 anterolisthesis. Other neck: Negative. Upper chest: Mild centrilobular emphysema of the lung apices. Review of the MIP images confirms the above findings CTA HEAD FINDINGS Anterior circulation: No significant stenosis, proximal occlusion, aneurysm, or vascular malformation. Non stenotic calcific atherosclerosis of carotid siphons. Posterior circulation: No significant stenosis, proximal occlusion, aneurysm, or vascular malformation. Mild right P2 stenosis. Venous sinuses: As permitted by contrast timing, patent. Anatomic variants: Complete circle-of-Willis. Right fetal PCA. Review of the MIP images confirms the above findings CT Brain Perfusion Findings: CBF (<30%) Volume: 0mL Perfusion (Tmax>6.0s) volume: 0mL Mismatch Volume: 0mL Infarction Location:Negative. IMPRESSION: CT head: 1. No  acute intracranial abnormality identified.  ASPECTS is 10. 2. Stable chronic microvascular ischemic changes and volume loss of the brain. 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 CT perfusion head: Negative CT perfusion head. No acute stroke or ischemic penumbra by RAPID perfusion criteria. CTA neck: 1. Patent carotid and vertebral arteries. No dissection, aneurysm, or high-grade stenosis by NASCET criteria. 2. Predominant fibrofatty plaque of bilateral carotid bifurcations with mild less than 50% proximal ICA stenosis. 3. 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 4. Moderate severe spondylosis of the cervical spine greatest at C6-7. CTA head: Patent anterior and posterior intracranial circulation. No large vessel occlusion, aneurysm, or significant stenosis is identified. These results were called by telephone at the time of interpretation on 09/06/2018 at 1:06 am to Dr. Bayard MalesANDOLPH BROWN , who verbally acknowledged these results. Electronically Signed   By: Mitzi HansenLance  Furusawa-Stratton  M.D.   On: 09/06/2018 01:09   Mr Brain Wo Contrast  Result Date: 09/06/2018 CLINICAL DATA:  Initial evaluation for acute syncope, history of atrial fibrillation. Evaluate for stroke. EXAM: MRI HEAD WITHOUT CONTRAST TECHNIQUE: Multiplanar, multiecho pulse sequences of the brain and surrounding structures were obtained without intravenous contrast. COMPARISON:  Comparison made with prior CTs from earlier the same day. FINDINGS: Brain: Diffuse prominence of the CSF containing spaces compatible with generalized age-related cerebral atrophy. Patchy and confluent T2/FLAIR hyperintensity within the periventricular deep white matter both cerebral hemispheres consistent with chronic microvascular ischemic disease, moderate to advanced in nature. Small chronic left occipital lobe infarct noted. Additional small chronic cortical infarct noted at  the anterior left frontal lobe. Few tiny remote left cerebellar infarcts. No abnormal foci of restricted diffusion to suggest acute or subacute ischemia. Gray-white matter differentiation maintained. No evidence for acute or chronic intracranial hemorrhage. No mass lesion, midline shift or mass effect. Ventricular prominence related to global parenchymal volume loss without hydrocephalus. No extra-axial fluid collection. Pituitary gland within normal limits. Vascular: Major intracranial vascular flow voids maintained. Skull and upper cervical spine: Craniocervical junction within normal limits. Multilevel degenerative spondylolysis noted within the upper cervical spine with resultant mild to moderate diffuse spinal stenosis. Bone marrow signal intensity within normal limits. No scalp soft tissue abnormality. Sinuses/Orbits: Globes and orbital soft tissues within normal limits. Mild scattered mucosal thickening throughout the paranasal sinuses. No air-fluid level to suggest acute sinusitis. Trace left mastoid effusion noted, of doubtful significance. Other: None. IMPRESSION: 1. No acute intracranial abnormality. 2. 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 Electronically Signed   By: Rise Mu M.D.   On: 09/06/2018 22:12   Ct Cerebral Perfusion W Contrast  Result Date: 09/06/2018 CLINICAL DATA:  73 y/o F; seizure-like activity. Focal neural deficit. Code stroke. EXAM: CT HEAD WITHOUT CONTRAST CT ANGIOGRAPHY HEAD AND NECK CT PERFUSION BRAIN TECHNIQUE: Multidetector CT imaging of the head and neck was performed using the standard protocol during bolus administration of intravenous contrast. Multiplanar CT image reconstructions and MIPs were obtained to evaluate the vascular anatomy. Carotid stenosis measurements (when applicable) are obtained utilizing NASCET criteria, using the distal  internal carotid diameter as the denominator. Multiphase CT imaging of the brain was performed following IV bolus contrast injection. Subsequent parametric perfusion maps were calculated using RAPID software. CONTRAST:  ISOVUE-370 IOPAMIDOL (ISOVUE-370) INJECTION 76% COMPARISON:  07/19/2018 CT head. 07/09/2018 MRI head. FINDINGS: CT HEAD FINDINGS Brain: No evidence of acute infarction, hemorrhage, hydrocephalus, extra-axial collection or mass lesion/mass effect. 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 Stable chronic microvascular ischemic changes and volume loss of the brain. Vascular: As below. Skull: Normal. Negative for fracture or focal lesion. Sinuses/Orbits: Small fluid levels within the paranasal sinuses. Normal aeration of mastoid air cells. Orbits are unremarkable. Other: None. ASPECTS (Alberta Stroke Program Early CT Score) - Ganglionic level infarction (caudate, lentiform nuclei, internal capsule, insula, M1-M3 cortex): 7 - Supraganglionic infarction (M4-M6 cortex): 3 Total score (0-10 with 10 being normal): 10 Review of the MIP images confirms the above findings CTA NECK FINDINGS Aortic arch: Standard branching. Imaged portion shows no evidence of aneurysm or dissection. No significant stenosis of the major arch vessel origins. Moderate mixed plaque of the aortic arch. Right carotid system: No evidence of dissection, stenosis (50% or greater) or occlusion. Predominant fibrofatty plaque of the carotid bifurcation  with minimal less than 30% proximal ICA stenosis. Left carotid system: No evidence of dissection, stenosis (50% or greater) or occlusion. Predominant fibrofatty plaque of the carotid bifurcation with mild less than 50% proximal ICA stenosis. Mild nonstenotic beaded irregularity of the left mid cervical ICA. Vertebral arteries: Codominant. No evidence of dissection, stenosis (50% or greater) or occlusion. Segments of non  stenotic beaded irregularity of the vertebral arteries. Skeleton: Moderate severe spondylosis of the cervical spine. Multilevel uncovertebral and facet hypertrophy with bony foraminal encroachment. Spinal canal stenosis is greatest at the C6-7 level where it is mild-to-moderate. Grade 1 C7-T1 anterolisthesis. Other neck: Negative. Upper chest: Mild centrilobular emphysema of the lung apices. Review of the MIP images confirms the above findings CTA HEAD FINDINGS Anterior circulation: No significant stenosis, proximal occlusion, aneurysm, or vascular malformation. Non stenotic calcific atherosclerosis of carotid siphons. Posterior circulation: No significant stenosis, proximal occlusion, aneurysm, or vascular malformation. Mild right P2 stenosis. Venous sinuses: As permitted by contrast timing, patent. Anatomic variants: Complete circle-of-Willis. Right fetal PCA. Review of the MIP images confirms the above findings CT Brain Perfusion Findings: CBF (<30%) Volume: 0mL Perfusion (Tmax>6.0s) volume: 0mL Mismatch Volume: 0mL Infarction Location:Negative. IMPRESSION: CT head: 1. No acute intracranial abnormality identified.  ASPECTS is 10. 2. Stable chronic microvascular ischemic changes and volume loss of the brain. 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 CT perfusion head: Negative CT perfusion head. No acute stroke or ischemic penumbra by RAPID perfusion criteria. CTA neck: 1. Patent carotid and vertebral arteries. No dissection, aneurysm, or high-grade stenosis by NASCET criteria. 2. Predominant fibrofatty plaque of bilateral carotid bifurcations with mild less than 50% proximal ICA stenosis. 3. 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 4. Moderate severe spondylosis of the cervical spine greatest at C6-7. CTA head: Patent anterior and posterior intracranial circulation. No large vessel occlusion, aneurysm, or  significant stenosis is identified. These results were called by telephone at the time of interpretation on 09/06/2018 at 1:06 am to Dr. Bayard Males , who verbally acknowledged these results. Electronically Signed   By: Mitzi Hansen M.D.   On: 09/06/2018 01:09   Dg Chest Portable 1 View  Result Date: 09/13/2018 CLINICAL DATA:  Patient found unresponsive today. EXAM: PORTABLE CHEST 1 VIEW COMPARISON:  PA and lateral chest 09/04/2018. Single-view of the chest 10/29/2017. FINDINGS: The lungs are clear. Heart size is normal. Aortic atherosclerosis noted. No pneumothorax or pleural effusion. No acute bony abnormality. Remote right sixth and seventh rib fracture identified. Remote left clavicle fracture also noted. IMPRESSION: No acute disease. Atherosclerosis. Electronically Signed   By: Drusilla Kanner M.D.   On: 09/13/2018 14:26   Ct Head Code Stroke Wo Contrast  Result Date: 09/06/2018 CLINICAL DATA:  73 y/o F; seizure-like activity. Focal neural deficit. Code stroke. EXAM: CT HEAD WITHOUT CONTRAST CT ANGIOGRAPHY HEAD AND NECK CT PERFUSION BRAIN TECHNIQUE: Multidetector CT imaging of the head and neck was performed using the standard protocol during bolus administration of intravenous contrast. Multiplanar CT image reconstructions and MIPs were obtained to evaluate the vascular anatomy. Carotid stenosis measurements (when applicable) are obtained utilizing NASCET criteria, using the distal internal carotid diameter as the denominator. Multiphase CT imaging of the brain was performed following IV bolus contrast injection. Subsequent parametric perfusion maps were calculated using RAPID software. CONTRAST:  ISOVUE-370 IOPAMIDOL (ISOVUE-370) INJECTION 76% COMPARISON:  07/19/2018 CT head. 07/09/2018 MRI head. FINDINGS: CT HEAD FINDINGS Brain: No evidence of  acute infarction, hemorrhage, hydrocephalus, extra-axial collection or mass lesion/mass effect. Stable small chronic infarcts are present  within the left frontal, left parietal, and left occipital lobes. Stable chronic microvascular ischemic changes and volume loss of the brain. Vascular: As below. Skull: Normal. Negative for fracture or focal lesion. Sinuses/Orbits: Small fluid levels within the paranasal sinuses. Normal aeration of mastoid air cells. Orbits are unremarkable. Other: None. ASPECTS (Alberta Stroke Program Early CT Score) - Ganglionic level infarction (caudate, lentiform nuclei, internal capsule, insula, M1-M3 cortex): 7 - Supraganglionic infarction (M4-M6 cortex): 3 Total score (0-10 with 10 being normal): 10 Review of the MIP images confirms the above findings CTA NECK FINDINGS Aortic arch: Standard branching. Imaged portion shows no evidence of aneurysm or dissection. No significant stenosis of the major arch vessel origins. Moderate mixed plaque of the aortic arch. Right carotid system: No evidence of dissection, stenosis (50% or greater) or occlusion. Predominant fibrofatty plaque of the carotid bifurcation with minimal less than 30% proximal ICA stenosis. Left carotid system: No evidence of dissection, stenosis (50% or greater) or occlusion. Predominant fibrofatty plaque of the carotid bifurcation with mild less than 50% proximal ICA stenosis. Mild nonstenotic beaded irregularity of the left mid cervical ICA. Vertebral arteries: Codominant. No evidence of dissection, stenosis (50% or greater) or occlusion. Segments of non stenotic beaded irregularity of the vertebral arteries. Skeleton: Moderate severe spondylosis of the cervical spine. Multilevel uncovertebral and facet hypertrophy with bony foraminal encroachment. Spinal canal stenosis is greatest at the C6-7 level where it is mild-to-moderate. Grade 1 C7-T1 anterolisthesis. Other neck: Negative. Upper chest: Mild centrilobular emphysema of the lung apices. Review of the MIP images confirms the above findings CTA HEAD FINDINGS Anterior circulation: No significant stenosis,  proximal occlusion, aneurysm, or vascular malformation. Non stenotic calcific atherosclerosis of carotid siphons. Posterior circulation: No significant stenosis, proximal occlusion, aneurysm, or vascular malformation. Mild right P2 stenosis. Venous sinuses: As permitted by contrast timing, patent. Anatomic variants: Complete circle-of-Willis. Right fetal PCA. Review of the MIP images confirms the above findings CT Brain Perfusion Findings: CBF (<30%) Volume: 0mL Perfusion (Tmax>6.0s) volume: 0mL Mismatch Volume: 0mL Infarction Location:Negative. IMPRESSION: CT head: 1. No acute intracranial abnormality identified.  ASPECTS is 10. 2. Stable chronic microvascular ischemic changes and volume loss of the brain. 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 CT perfusion head: Negative CT perfusion head. No acute stroke or ischemic penumbra by RAPID perfusion criteria. CTA neck: 1. Patent carotid and vertebral arteries. No dissection, aneurysm, or high-grade stenosis by NASCET criteria. 2. Predominant fibrofatty plaque of bilateral carotid bifurcations with mild less than 50% proximal ICA stenosis. 3. 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 4. Moderate severe spondylosis of the cervical spine greatest at C6-7. CTA head: Patent anterior and posterior intracranial circulation. No large vessel occlusion, aneurysm, or significant stenosis is identified. These results were called by telephone at the time of interpretation on 09/06/2018 at 1:06 am to Dr. Bayard Males , who verbally acknowledged these results. Electronically Signed   By: Mitzi Hansen M.D.   On: 09/06/2018 01:09     CBC Recent Labs  Mack 09/09/18 0504 09/13/18 1435 09/14/18 0445  WBC 7.5 14.0* 11.7*  HGB 9.4* 13.5 11.0*  HCT 30.0* 43.2 35.1*  PLT 180 284 213  MCV 95.2 97.3 96.2  MCH 29.8 30.4 30.1  MCHC 31.3 31.3 31.3  RDW 16.1* 16.1*  15.8*  LYMPHSABS  --  1.6  --  MONOABS  --  1.0  --   EOSABS  --  0.2  --   BASOSABS  --  0.1  --     Chemistries  Recent Labs  Mack 09/08/18 0433 09/13/18 1435 09/14/18 0445  NA 143 140 141  K 3.3* 5.2* 4.6  CL 116* 105 109  CO2 21* 25 25  GLUCOSE 92 109* 149*  BUN 18 48* 43*  CREATININE 1.20* 1.87* 1.71*  CALCIUM 8.0* 9.4 8.4*  AST  --  20 15  ALT  --  10 8  ALKPHOS  --  84 73  BILITOT  --  0.6 0.5   ------------------------------------------------------------------------------------------------------------------ estimated creatinine clearance is 25.3 mL/min (A) (by C-G formula based on SCr of 1.71 mg/dL (H)). ------------------------------------------------------------------------------------------------------------------ No results for input(s): HGBA1C in the last 72 hours. ------------------------------------------------------------------------------------------------------------------ No results for input(s): CHOL, HDL, LDLCALC, TRIG, CHOLHDL, LDLDIRECT in the last 72 hours. ------------------------------------------------------------------------------------------------------------------ No results for input(s): TSH, T4TOTAL, T3FREE, THYROIDAB in the last 72 hours.  Invalid input(s): FREET3 ------------------------------------------------------------------------------------------------------------------ No results for input(s): VITAMINB12, FOLATE, FERRITIN, TIBC, IRON, RETICCTPCT in the last 72 hours.  Coagulation profile No results for input(s): INR, PROTIME in the last 168 hours.  No results for input(s): DDIMER in the last 72 hours.  Cardiac Enzymes No results for input(s): CKMB, TROPONINI, MYOGLOBIN in the last 168 hours.  Invalid input(s): CK ------------------------------------------------------------------------------------------------------------------ Invalid input(s): POCBNP    Assessment & Plan  Pt is 72 with recent hospitalization for sz and  uti   *Seizure: with recurrent sz  Continue Keppra Appreciate neurology input Patient started on Vimpat   *UTI-continue ceftriaxone follow urine culture  * Hypertension: Controlled; continue amlodipine  * Atrial fibrillation: Rate controlled; continue Eliquis.  * History of stroke: The patient has a history of falls and likely has gait disturbance secondary to her remote strokes. Continue secondary prevention by continuing statin therapy and control of atrial fibrillation and hypertension.  * DVT prophylaxis: As above  * GI prophylaxis: None     Code Status Orders  (From admission, onward)         Start     Ordered   09/13/18 1822  Full code  Continuous     09/13/18 1822        Code Status History    Date Active Date Inactive Code Status Order ID Comments User Context   09/08/2018 2254 09/09/2018 2026 DNR [id number]  Altamese Dilling, MD Inpatient   09/06/2018 0728 09/08/2018 2254 Full Code [id number]  Arnaldo Natal, MD ED   08/28/2018 1307 08/31/2018 2356 Full Code [id number]  Ihor Austin, MD Inpatient   07/06/2018 1628 07/09/2018 2237 Full Code [id number]  Altamese Dilling, MD ED   06/24/2018 1648 07/03/2018 1942 Full Code [id number]  Adrian Saran, MD Inpatient   05/20/2018 1847 05/21/2018 2212 Full Code [id number]  Destiny Lab, MD Inpatient   05/20/2018 1847 05/20/2018 1847 Full Code [id number]  Destiny Lab, MD Inpatient   12/31/2017 1538 01/04/2018 2130 DNR [id number]  Milagros Loll, MD ED   12/13/2017 1630 12/15/2017 2016 Full Code [id number]  Alford Highland, MD ED   11/22/2017 0202 11/23/2017 2009 DNR [id number]  Bertrum Sol, MD ED   11/22/2017 0150 11/22/2017 0202 Full Code [id number]  Salary, Evelena Asa, MD ED   11/01/2017 1026 11/02/2017 0155 DNR [id number]  Delfino Lovett, MD Inpatient   10/29/2017 1256 11/01/2017 1026 Full Code [id number]  Alford Highland, MD ED           Consults neurology   DVT  Prophylaxis  Lovenox   Mack Results  Component Value  Date   PLT 213 09/14/2018     Time Spent in minutes   35 minutes  Greater than 50% of time spent in care coordination and counseling patient regarding the condition and plan of care.   Auburn Bilberry M.D on 09/14/2018 at 11:47 AM  Between 7am to 6pm - Pager - [phone]  After 6pm go to [email or web address] - Social research officer, government  [employer]   Office  [phone]

## 2018-09-14 NOTE — Consult Note (Signed)
NEUROLOGY CONSULT  Reason for Consult: Recurrent seizures  Referring Physician: Dr. Amado Coe  CC: Found down unresponsive with GTC sz   HPI: Destiny Mack is an 72 y.o. 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  In the emergency room patient was found to have a UTI and elevated Cr, she was started on antibiotics, CT head showed no acute changes, she was admitted to the hospital for metabolic encephalopathy/UTI/breakthrough seizures. Patient was recently admitted to the hospital for the same reason, she has neurological evaluation with an extensive neurological work-up for seizures she was discharged on Keppra 500 mg twice a day and according to her daughter she is compliant with medication. Patient had a recent MRI of the brain on September 06, 2018 showed no acute intracranial abnormalities. According to the daughter patient was not complaining about any chest pain, any shortness of breath, she did not have any fever, she did not have any nausea or vomiting.  Past Medical History Past Medical History:  Diagnosis Date  . A-fib (HCC)   . Depression   . Frequent falls   . Hypertension   . Leaking of urine   . Mixed incontinence   . Stroke (HCC)   . Tremor of hands and face     Past Surgical History Past Surgical History:  Procedure Laterality Date  . APPENDECTOMY    . TEE WITHOUT CARDIOVERSION N/A 01/04/2018   Procedure: TRANSESOPHAGEAL ECHOCARDIOGRAM (TEE);  Surgeon: Lamar Blinks, MD;  Location: [facility] ORS;  Service: Cardiovascular;  Laterality: N/A;  . TUBAL LIGATION      Family History Family History  Problem Relation Age of Onset  . Dementia Mother   . Heart failure Mother   . Heart failure Father   . CAD Father     Social  History    reports that she has been smoking. She has a 112.00 pack-year smoking history. She has never used smokeless tobacco. She reports that she does not drink alcohol or use drugs.  Allergies No Known Allergies  Home Medications Medications Prior to Admission  Medication Sig Dispense Refill  . acetaminophen (TYLENOL) 500 MG tablet Take 500 mg by mouth 3 (three) times daily.    Marland Kitchen amLODipine (NORVASC) 5 MG tablet Take 5 mg by mouth daily.    Marland Kitchen apixaban (ELIQUIS) 5 MG TABS tablet Take 1 tablet (5 mg total) by mouth 2 (two) times daily. 60 tablet 1  . atorvastatin (LIPITOR) 20 MG tablet Take 20 mg by mouth at bedtime.     . levETIRAcetam (KEPPRA) 500 MG tablet Take 1 tablet (500 mg total) by mouth 2 (two) times daily. 60 tablet 1  . nicotine (NICODERM CQ - DOSED IN MG/24 HOURS) 21 mg/24hr patch Place 1 patch (21 mg total) onto the skin daily. 14 patch 0  . senna-docusate (SENOKOT-S) 8.6-50 MG tablet Take 1 tablet by mouth at bedtime as needed for mild constipation.    . sertraline (ZOLOFT) 50 MG tablet Take 50 mg by mouth daily.      Hospital Medications . chlorhexidine  15 mL Mouth Rinse BID  . mouth rinse  15 mL Mouth Rinse q12n4p     ROS:  Negative except of what is reported in HPI   Physical Examination:  Vitals:   09/13/18  1900 09/13/18 1910 09/13/18 1959 09/14/18 0602  BP: (!) 146/54  (!) 140/48 124/78  Pulse: 69 76 67 70  Resp: 10 11 18 17   Temp:   (!) 97.3 F (36.3 C) 98.9 F (37.2 C)  TempSrc:    Axillary  SpO2: 95% 95% 100% 99%    General NAD, lying in bed  Heart - Irregular  Lungs - Clear to auscultation Abdomen - Soft - non tender Extremities - Distal pulses intact - no edema Skin - Warm and dry  Neurologic Examination:   Mental Status: Awake, slow psychomotor, following commands with constant stimulation. No speech output  Cranial Nerves: Blinks to threat B/L, face symmetric  Motor: Moves all 4 extremities equally   LABORATORY STUDIES:  Basic  Metabolic Panel: Recent Labs  Lab 09/08/18 0433 09/13/18 1435 09/14/18 0445  NA 143 140 141  K 3.3* 5.2* 4.6  CL 116* 105 109  CO2 21* 25 25  GLUCOSE 92 109* 149*  BUN 18 48* 43*  CREATININE 1.20* 1.87* 1.71*  CALCIUM 8.0* 9.4 8.4*    Liver Function Tests: Recent Labs  Lab 09/13/18 1435 09/14/18 0445  AST 20 15  ALT 10 8  ALKPHOS 84 73  BILITOT 0.6 0.5  PROT 8.1 6.6  ALBUMIN 3.9 3.2*   No results for input(s): LIPASE, AMYLASE in the last 168 hours. No results for input(s): AMMONIA in the last 168 hours.  CBC: Recent Labs  Lab 09/09/18 0504 09/13/18 1435 09/14/18 0445  WBC 7.5 14.0* 11.7*  NEUTROABS  --  11.1*  --   HGB 9.4* 13.5 11.0*  HCT 30.0* 43.2 35.1*  MCV 95.2 97.3 96.2  PLT 180 284 213    Cardiac Enzymes: No results for input(s): CKTOTAL, CKMB, CKMBINDEX, TROPONINI in the last 168 hours.  BNP: Invalid input(s): POCBNP  CBG: Recent Labs  Lab 09/14/18 0742  GLUCAP 135*    Microbiology:   Coagulation Studies: No results for input(s): LABPROT, INR in the last 72 hours.  Urinalysis:  Recent Labs  Lab 09/13/18 1435  COLORURINE PINK*  LABSPEC 1.008  PHURINE 7.0  GLUCOSEU NEGATIVE  HGBUR LARGE*  BILIRUBINUR NEGATIVE  KETONESUR NEGATIVE  PROTEINUR 100*  NITRITE NEGATIVE  LEUKOCYTESUR LARGE*    Lipid Panel:     Component Value Date/Time   CHOL 162 05/21/2018 0321   TRIG 92 06/27/2018 0433   HDL 33 (L) 05/21/2018 0321   CHOLHDL 4.9 05/21/2018 0321   VLDL 38 05/21/2018 0321   LDLCALC 91 05/21/2018 0321    HgbA1C:  Lab Results  Component Value Date   HGBA1C 5.5 05/21/2018    Urine Drug Screen:      Component Value Date/Time   LABOPIA NONE DETECTED 07/19/2018 1018   COCAINSCRNUR NONE DETECTED 07/19/2018 1018   LABBENZ NONE DETECTED 07/19/2018 1018   AMPHETMU NONE DETECTED 07/19/2018 1018   THCU NONE DETECTED 07/19/2018 1018   LABBARB NONE DETECTED 07/19/2018 1018     Alcohol Level:  No results for input(s): ETH in  the last 168 hours.  Miscellaneous labs:  EKG  EKG  IMAGING: Ct Head Wo Contrast  Result Date: 09/13/2018 CLINICAL DATA:  Altered mental status EXAM: CT HEAD WITHOUT CONTRAST TECHNIQUE: Contiguous axial images were obtained from the base of the skull through the vertex without intravenous contrast. COMPARISON:  09/06/2018 FINDINGS: Brain: No evidence of acute infarction, hemorrhage, extra-axial collection, ventriculomegaly, or mass effect. Generalized cerebral atrophy. Periventricular white matter low attenuation likely secondary to microangiopathy. Vascular: Cerebrovascular atherosclerotic calcifications are  noted. Skull: Negative for fracture or focal lesion. Sinuses/Orbits: Visualized portions of the orbits are unremarkable. Mastoid sinuses are clear. Air-fluid level in the left sphenoid sinus. Other: None. IMPRESSION: 1. No acute intracranial pathology. 2. Chronic microvascular disease and cerebral atrophy. Electronically Signed   By: Elige KoHetal  Patel   On: 09/13/2018 17:26   Dg Chest Portable 1 View  Result Date: 09/13/2018 CLINICAL DATA:  Patient found unresponsive today. EXAM: PORTABLE CHEST 1 VIEW COMPARISON:  PA and lateral chest 09/04/2018. Single-view of the chest 10/29/2017. FINDINGS: The lungs are clear. Heart size is normal. Aortic atherosclerosis noted. No pneumothorax or pleural effusion. No acute bony abnormality. Remote right sixth and seventh rib fracture identified. Remote left clavicle fracture also noted. IMPRESSION: No acute disease. Atherosclerosis. Electronically Signed   By: Drusilla Kannerhomas  Dalessio M.D.   On: 09/13/2018 14:26     Assessment/Plan: 73 y.o. 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  Most likely her UTI and elevated  creatinine has decreased her seizure threshold.  Plan  - Continue Keppra with the current dose 500 MG BID, it can not be increased due to her abnormal Cr Cl  -Add Vimpat 200 mg BID -Check EEG -Since she had a recent negative MRI brain 09/06/2018, no need to repeat MRI unless pt mental state doesn't improve with treating her UTI and correcting the metabolic derangements in the next couple of days. -PT/OT and speech evaluation    Jonna Munroarek La Shehan, MD

## 2018-09-15 ENCOUNTER — Inpatient Hospital Stay: Payer: Medicare HMO

## 2018-09-15 ENCOUNTER — Inpatient Hospital Stay: Payer: Self-pay

## 2018-09-15 ENCOUNTER — Other Ambulatory Visit: Payer: Self-pay

## 2018-09-15 LAB — BASIC METABOLIC PANEL
Anion gap: 6 (ref 5–15)
BUN: 32 mg/dL — ABNORMAL HIGH (ref 8–23)
CO2: 25 mmol/L (ref 22–32)
Calcium: 8.5 mg/dL — ABNORMAL LOW (ref 8.9–10.3)
Chloride: 109 mmol/L (ref 98–111)
Creatinine, Ser: 1.39 mg/dL — ABNORMAL HIGH (ref 0.44–1.00)
GFR calc Af Amer: 43 mL/min — ABNORMAL LOW (ref >60)
GFR calc non Af Amer: 38 mL/min — ABNORMAL LOW (ref >60)
GLUCOSE: 140 mg/dL — AB (ref 70–99)
Potassium: 4.1 mmol/L (ref 3.5–5.1)
Sodium: 140 mmol/L (ref 135–145)

## 2018-09-15 LAB — LEVETIRACETAM LEVEL: Levetiracetam Lvl: 30.4 ug/mL (ref 10.0–40.0)

## 2018-09-15 MED ORDER — ONDANSETRON HCL 4 MG/2ML IJ SOLN
INTRAMUSCULAR | Status: AC
  Administered 2018-09-15: 4 mg via INTRAVENOUS
  Filled 2018-09-15: qty 2

## 2018-09-15 MED ORDER — LORAZEPAM 2 MG/ML IJ SOLN
2.0000 mg | INTRAMUSCULAR | Status: DC | PRN
  Administered 2018-09-15: 2 mg via INTRAVENOUS
  Filled 2018-09-15: qty 1

## 2018-09-15 MED ORDER — SODIUM CHLORIDE 0.9 % IV SOLN
3.0000 g | Freq: Four times a day (QID) | INTRAVENOUS | Status: DC
  Administered 2018-09-15 – 2018-09-16 (×4): 3 g via INTRAVENOUS
  Filled 2018-09-15 (×7): qty 3

## 2018-09-15 MED ORDER — SODIUM CHLORIDE 0.9 % IV SOLN
920.0000 mg | Freq: Once | INTRAVENOUS | Status: AC
  Administered 2018-09-15: 20:00:00 920 mg via INTRAVENOUS
  Filled 2018-09-15: qty 18.4

## 2018-09-15 MED ORDER — LEVETIRACETAM IN NACL 1000 MG/100ML IV SOLN: 1000.0000 mg | Freq: Once | INTRAVENOUS | Status: DC

## 2018-09-15 MED ORDER — SODIUM CHLORIDE 0.9 % IV SOLN
15.0000 mg/kg | Freq: Once | INTRAVENOUS | Status: DC
  Filled 2018-09-15: qty 18.39

## 2018-09-15 NOTE — Progress Notes (Signed)
[employer] - [place] at [facility]                                                                                                                                                                                  Patient Demographics   Destiny Mack, is a 73 y.o. female, DOB - 1945-12-15, RKY:[id number]  Admit date - 09/13/2018   Admitting Physician Ramonita Lab, MD  Outpatient Primary MD for the patient is Oswaldo Conroy, MD   LOS - 2  Subjective: Rapid response called.  Seen and evaluated the patient.  Patient was stating and right upper extremity shaking.  Not responding to verbal commands with active seizures.  Daughter at bedside.  Review of Systems:   CONSTITUTIONAL: Altered, actively seizing Vitals:   Vitals:   09/14/18 1927 09/14/18 2330 09/15/18 0408 09/15/18 0850  BP: (!) 104/57 (!) 155/65 129/69   Pulse: 68 100 73   Resp: 17 18 18    Temp: 98.6 F (37 C) 98.3 F (36.8 C)    TempSrc: Oral Oral    SpO2: 96% 91% 97% 100%    Wt Readings from Last 3 Encounters:  09/08/18 61.3 kg  09/04/18 58.1 kg  08/28/18 58.1 kg     Intake/Output Summary (Last 24 hours) at 09/15/2018 1600 Last data filed at 09/15/2018 1532 Gross per 24 hour  Intake 1801.51 ml  Output 1150 ml  Net 651.51 ml    Physical Exam:   GENERAL: Ill-appearing HEAD, EYES, EARS, NOSE AND THROAT: Atraumatic, normocephalic. Extraocular muscles are intact. Pupils equal and reactive to light. Sclerae anicteric. No conjunctival injection. No oro-pharyngeal erythema.  NECK: Supple. There is no jugular venous distention. No bruits, no lymphadenopathy, no thyromegaly.  HEART: Regular rate and rhythm,. No murmurs, no rubs, no clicks.  LUNGS: Rhonchus breath sound bilaterally ABDOMEN: Soft, flat, nontender, nondistended. Has good bowel sounds. No hepatosplenomegaly appreciated.  EXTREMITIES: Right upper extremity with twitching movements no evidence of any cyanosis, clubbing, or peripheral edema.   +2 pedal and radial pulses bilaterally.  NEUROLOGIC: Patient is altered with right upper extremity twitching movements and staring sKIN: Moist and warm with no rashes appreciated.  Psych: Not anxious, depressed LN: No inguinal LN enlargement    Antibiotics   Anti-infectives (From admission, onward)   Start     Dose/Rate Route Frequency Ordered Stop   09/15/18 1600  Ampicillin-Sulbactam (UNASYN) 3 g in sodium chloride 0.9 % 100 mL IVPB     3 g 200 mL/hr over 30 Minutes Intravenous Every 6 hours 09/15/18 1537     09/14/18 1800  cefTRIAXone (ROCEPHIN) 1 g in sodium chloride 0.9 % 100 mL IVPB  Status:  Discontinued  1 g 200 mL/hr over 30 Minutes Intravenous Every 24 hours 09/13/18 1812 09/15/18 1536   09/13/18 1630  cefTRIAXone (ROCEPHIN) 1 g in sodium chloride 0.9 % 100 mL IVPB     1 g 200 mL/hr over 30 Minutes Intravenous  Once 09/13/18 1618 09/13/18 1827      Medications   Scheduled Meds: . chlorhexidine  15 mL Mouth Rinse BID  . mouth rinse  15 mL Mouth Rinse q12n4p  . modafinil  100 mg Oral Daily   Continuous Infusions: . sodium chloride Stopped (09/14/18 1356)  . ampicillin-sulbactam (UNASYN) IV    . dextrose 5 % and 0.45% NaCl 100 mL/hr at 09/15/18 0500  . fosPHENYtoin (CEREBYX) IV    . lacosamide (VIMPAT) IV 200 mg (09/15/18 1407)  . levETIRAcetam 500 mg (09/15/18 0803)   PRN Meds:.sodium chloride, acetaminophen **OR** acetaminophen, LORazepam, ondansetron **OR** ondansetron (ZOFRAN) IV   Data Review:   Micro Results Recent Results (from the past 240 hour(s))  Urine culture     Status: Abnormal   Collection Time: 09/06/18 12:53 AM  Result Value Ref Range Status   Specimen Description URINE, RANDOM  Final   Special Requests   Final    NONE Performed at [facility], [address] [place], [place] [address]    Culture 10,000 COLONIES/mL YEAST (A)  Final   Report Status 09/07/2018 FINAL  Final  Urine Culture     Status: None   Collection Time:  09/13/18  2:35 PM  Result Value Ref Range Status   Specimen Description   Final    URINE, RANDOM Performed at [facility], [address] [place], [place] [address]    Special Requests   Final    NONE Performed at [facility], [address] [place], [place] [address]    Culture   Final    NO GROWTH Performed at [facility], [address] [place], [place] [address]    Report Status 09/14/2018 FINAL  Final    Radiology Reports Ct Angio Head W Or Wo Contrast  Result Date: 09/06/2018 CLINICAL DATA:  73 y/o F; seizure-like activity. Focal neural deficit. Code stroke. EXAM: CT HEAD WITHOUT CONTRAST CT ANGIOGRAPHY HEAD AND NECK CT PERFUSION BRAIN TECHNIQUE: Multidetector CT imaging of the head and neck was performed using the standard protocol during bolus administration of intravenous contrast. Multiplanar CT image reconstructions and MIPs were obtained to evaluate the vascular anatomy. Carotid stenosis measurements (when applicable) are obtained utilizing NASCET criteria, using the distal internal carotid diameter as the denominator. Multiphase CT imaging of the brain was performed following IV bolus contrast injection. Subsequent parametric perfusion maps were calculated using RAPID software. CONTRAST:  ISOVUE-370 IOPAMIDOL (ISOVUE-370) INJECTION 76% COMPARISON:  07/19/2018 CT head. 07/09/2018 MRI head. FINDINGS: CT HEAD FINDINGS Brain: No evidence of acute infarction, hemorrhage, hydrocephalus, extra-axial collection or mass lesion/mass effect. 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 Stable chronic microvascular ischemic changes and volume loss of the brain. Vascular: As below. Skull: Normal. Negative for fracture or focal lesion. Sinuses/Orbits: Small fluid levels within the paranasal sinuses. Normal aeration of mastoid air cells. Orbits are unremarkable. Other: None. ASPECTS (Alberta Stroke  Program Early CT Score) - Ganglionic level infarction (caudate, lentiform nuclei, internal capsule, insula, M1-M3 cortex): 7 - Supraganglionic infarction (M4-M6 cortex): 3 Total score (0-10 with 10 being normal): 10 Review of the MIP images confirms the above findings CTA NECK FINDINGS  Aortic arch: Standard branching. Imaged portion shows no evidence of aneurysm or dissection. No significant stenosis of the major arch vessel origins. Moderate mixed plaque of the aortic arch. Right carotid system: No evidence of dissection, stenosis (50% or greater) or occlusion. Predominant fibrofatty plaque of the carotid bifurcation with minimal less than 30% proximal ICA stenosis. Left carotid system: No evidence of dissection, stenosis (50% or greater) or occlusion. Predominant fibrofatty plaque of the carotid bifurcation with mild less than 50% proximal ICA stenosis. Mild nonstenotic beaded irregularity of the left mid cervical ICA. Vertebral arteries: Codominant. No evidence of dissection, stenosis (50% or greater) or occlusion. Segments of non stenotic beaded irregularity of the vertebral arteries. Skeleton: Moderate severe spondylosis of the cervical spine. Multilevel uncovertebral and facet hypertrophy with bony foraminal encroachment. Spinal canal stenosis is greatest at the C6-7 level where it is mild-to-moderate. Grade 1 C7-T1 anterolisthesis. Other neck: Negative. Upper chest: Mild centrilobular emphysema of the lung apices. Review of the MIP images confirms the above findings CTA HEAD FINDINGS Anterior circulation: No significant stenosis, proximal occlusion, aneurysm, or vascular malformation. Non stenotic calcific atherosclerosis of carotid siphons. Posterior circulation: No significant stenosis, proximal occlusion, aneurysm, or vascular malformation. Mild right P2 stenosis. Venous sinuses: As permitted by contrast timing, patent. Anatomic variants: Complete circle-of-Willis. Right fetal PCA. Review of the MIP images  confirms the above findings CT Brain Perfusion Findings: CBF (<30%) Volume: 0mL Perfusion (Tmax>6.0s) volume: 0mL Mismatch Volume: 0mL Infarction Location:Negative. IMPRESSION: CT head: 1. No acute intracranial abnormality identified.  ASPECTS is 10. 2. Stable chronic microvascular ischemic changes and volume loss of the brain. 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 CT perfusion head: Negative CT perfusion head. No acute stroke or ischemic penumbra by RAPID perfusion criteria. CTA neck: 1. Patent carotid and vertebral arteries. No dissection, aneurysm, or high-grade stenosis by NASCET criteria. 2. Predominant fibrofatty plaque of bilateral carotid bifurcations with mild less than 50% proximal ICA stenosis. 3. 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 4. Moderate severe spondylosis of the cervical spine greatest at C6-7. CTA head: Patent anterior and posterior intracranial circulation. No large vessel occlusion, aneurysm, or significant stenosis is identified. These results were called by telephone at the time of interpretation on 09/06/2018 at 1:06 am to Dr. Bayard Males , who verbally acknowledged these results. Electronically Signed   By: Mitzi Hansen M.D.   On: 09/06/2018 01:09   Dg Chest 2 View  Result Date: 09/04/2018 CLINICAL DATA:  Altered mental status EXAM: CHEST - 2 VIEW COMPARISON:  07/19/2018 FINDINGS: The heart size and mediastinal contours are within normal limits. Both lungs are clear. Old left clavicle fracture. IMPRESSION: No active cardiopulmonary disease. Electronically Signed   By: Jasmine Pang M.D.   On: 09/04/2018 16:26   Ct Head Wo Contrast  Result Date: 09/13/2018 CLINICAL DATA:  Altered mental status EXAM: CT HEAD WITHOUT CONTRAST TECHNIQUE: Contiguous axial images were obtained from the base of the skull through the vertex without intravenous contrast. COMPARISON:   09/06/2018 FINDINGS: Brain: No evidence of acute infarction, hemorrhage, extra-axial collection, ventriculomegaly, or mass effect. Generalized cerebral atrophy. Periventricular white matter low attenuation likely secondary to microangiopathy. Vascular: Cerebrovascular atherosclerotic calcifications are noted. Skull: Negative for fracture or focal lesion. Sinuses/Orbits: Visualized portions of the orbits are unremarkable. Mastoid sinuses are clear. Air-fluid level in the left sphenoid sinus. Other: None. IMPRESSION: 1. No acute intracranial pathology. 2. Chronic microvascular disease and cerebral atrophy.  Electronically Signed   By: Elige Ko   On: 09/13/2018 17:26   Ct Angio Neck W Or Wo Contrast  Result Date: 09/06/2018 CLINICAL DATA:  73 y/o F; seizure-like activity. Focal neural deficit. Code stroke. EXAM: CT HEAD WITHOUT CONTRAST CT ANGIOGRAPHY HEAD AND NECK CT PERFUSION BRAIN TECHNIQUE: Multidetector CT imaging of the head and neck was performed using the standard protocol during bolus administration of intravenous contrast. Multiplanar CT image reconstructions and MIPs were obtained to evaluate the vascular anatomy. Carotid stenosis measurements (when applicable) are obtained utilizing NASCET criteria, using the distal internal carotid diameter as the denominator. Multiphase CT imaging of the brain was performed following IV bolus contrast injection. Subsequent parametric perfusion maps were calculated using RAPID software. CONTRAST:  ISOVUE-370 IOPAMIDOL (ISOVUE-370) INJECTION 76% COMPARISON:  07/19/2018 CT head. 07/09/2018 MRI head. FINDINGS: CT HEAD FINDINGS Brain: No evidence of acute infarction, hemorrhage, hydrocephalus, extra-axial collection or mass lesion/mass effect. 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 Stable chronic microvascular ischemic changes and volume loss of the brain. Vascular: As below. Skull: Normal.  Negative for fracture or focal lesion. Sinuses/Orbits: Small fluid levels within the paranasal sinuses. Normal aeration of mastoid air cells. Orbits are unremarkable. Other: None. ASPECTS (Alberta Stroke Program Early CT Score) - Ganglionic level infarction (caudate, lentiform nuclei, internal capsule, insula, M1-M3 cortex): 7 - Supraganglionic infarction (M4-M6 cortex): 3 Total score (0-10 with 10 being normal): 10 Review of the MIP images confirms the above findings CTA NECK FINDINGS Aortic arch: Standard branching. Imaged portion shows no evidence of aneurysm or dissection. No significant stenosis of the major arch vessel origins. Moderate mixed plaque of the aortic arch. Right carotid system: No evidence of dissection, stenosis (50% or greater) or occlusion. Predominant fibrofatty plaque of the carotid bifurcation with minimal less than 30% proximal ICA stenosis. Left carotid system: No evidence of dissection, stenosis (50% or greater) or occlusion. Predominant fibrofatty plaque of the carotid bifurcation with mild less than 50% proximal ICA stenosis. Mild nonstenotic beaded irregularity of the left mid cervical ICA. Vertebral arteries: Codominant. No evidence of dissection, stenosis (50% or greater) or occlusion. Segments of non stenotic beaded irregularity of the vertebral arteries. Skeleton: Moderate severe spondylosis of the cervical spine. Multilevel uncovertebral and facet hypertrophy with bony foraminal encroachment. Spinal canal stenosis is greatest at the C6-7 level where it is mild-to-moderate. Grade 1 C7-T1 anterolisthesis. Other neck: Negative. Upper chest: Mild centrilobular emphysema of the lung apices. Review of the MIP images confirms the above findings CTA HEAD FINDINGS Anterior circulation: No significant stenosis, proximal occlusion, aneurysm, or vascular malformation. Non stenotic calcific atherosclerosis of carotid siphons. Posterior circulation: No significant stenosis, proximal occlusion,  aneurysm, or vascular malformation. Mild right P2 stenosis. Venous sinuses: As permitted by contrast timing, patent. Anatomic variants: Complete circle-of-Willis. Right fetal PCA. Review of the MIP images confirms the above findings CT Brain Perfusion Findings: CBF (<30%) Volume: 0mL Perfusion (Tmax>6.0s) volume: 0mL Mismatch Volume: 0mL Infarction Location:Negative. IMPRESSION: CT head: 1. No acute intracranial abnormality identified.  ASPECTS is 10. 2. Stable chronic microvascular ischemic changes and volume loss of the brain. 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 CT perfusion head: Negative CT perfusion head. No acute stroke or ischemic penumbra by RAPID perfusion criteria. CTA neck: 1. Patent carotid and vertebral arteries. No dissection, aneurysm, or high-grade stenosis by NASCET criteria. 2. Predominant fibrofatty plaque of bilateral carotid bifurcations with mild less than  50% proximal ICA stenosis. 3. 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 4. Moderate severe spondylosis of the cervical spine greatest at C6-7. CTA head: Patent anterior and posterior intracranial circulation. No large vessel occlusion, aneurysm, or significant stenosis is identified. These results were called by telephone at the time of interpretation on 09/06/2018 at 1:06 am to Dr. Bayard Males , who verbally acknowledged these results. Electronically Signed   By: Mitzi Hansen M.D.   On: 09/06/2018 01:09   Mr Brain Wo Contrast  Result Date: 09/15/2018 CLINICAL DATA:  73 year old female with altered mental status, found down. Seizure. EXAM: MRI HEAD WITHOUT CONTRAST TECHNIQUE: Multiplanar, multiecho pulse sequences of the brain and surrounding structures were obtained without intravenous contrast. COMPARISON:  Head CT 09/13/2018.  Brain MRI 09/06/2018 and earlier. FINDINGS: Brain: No restricted diffusion or evidence of  acute infarction. There are stable small areas of cortical encephalomalacia in the left anterior frontal gyrus, left occipital pole, and right superior temporal gyrus. Confluent bilateral cerebral white matter T2 and FLAIR hyperintensity. No definite chronic cerebral blood products. One or 2 small chronic lacunar infarcts in the right thalamus. Mild T2 heterogeneity in the pons. Negative cerebellum. No midline shift, mass effect, evidence of mass lesion, ventriculomegaly, extra-axial collection or acute intracranial hemorrhage. Cervicomedullary junction and pituitary are within normal limits. Vascular: Major intracranial vascular flow voids are stable. Skull and upper cervical spine: Widespread advanced cervical spine degeneration with up to moderate appearing degenerative spinal stenosis at C3-C4 with spinal cord mass effect (series 6, image 13), and a lesser degree of stenosis at most other visible levels. Visualized bone marrow signal is within normal limits. Sinuses/Orbits: Negative orbits. Fluid level in the left sphenoid sinus has increased from earlier this month. Mild right sphenoid opacification. Other: Mastoids remain clear. Grossly normal visible internal auditory structures. Scalp and face soft tissues appear negative. IMPRESSION: 1. Continued stable MRI appearance of the brain. No acute intracranial abnormality. 2. 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 Electronically Signed   By: Odessa Fleming M.D.   On: 09/15/2018 13:11   Mr Brain Wo Contrast  Result Date: 09/06/2018 CLINICAL DATA:  Initial evaluation for acute syncope, history of atrial fibrillation. Evaluate for stroke. EXAM: MRI HEAD WITHOUT CONTRAST TECHNIQUE: Multiplanar, multiecho pulse sequences of the brain and surrounding structures were obtained without intravenous contrast. COMPARISON:  Comparison made with prior CTs from earlier the same day. FINDINGS: Brain: Diffuse prominence of the CSF containing spaces  compatible with generalized age-related cerebral atrophy. Patchy and confluent T2/FLAIR hyperintensity within the periventricular deep white matter both cerebral hemispheres consistent with chronic microvascular ischemic disease, moderate to advanced in nature. Small chronic left occipital lobe infarct noted. Additional small chronic cortical infarct noted at the anterior left frontal lobe. Few tiny remote left cerebellar infarcts. No abnormal foci of restricted diffusion to suggest acute or subacute ischemia. Gray-white matter differentiation maintained. No evidence for acute or chronic intracranial hemorrhage. No mass lesion, midline shift or mass effect. Ventricular prominence related to global parenchymal volume loss without hydrocephalus. No extra-axial fluid collection. Pituitary gland within normal limits. Vascular: Major intracranial vascular flow voids maintained. Skull and upper cervical spine: Craniocervical junction within normal limits. Multilevel degenerative spondylolysis noted within the upper cervical spine with resultant mild to moderate diffuse spinal stenosis. Bone marrow signal intensity within normal limits. No scalp soft tissue abnormality. Sinuses/Orbits: Globes and orbital soft tissues within normal limits. Mild scattered mucosal thickening throughout the paranasal sinuses. No  air-fluid level to suggest acute sinusitis. Trace left mastoid effusion noted, of doubtful significance. Other: None. IMPRESSION: 1. No acute intracranial abnormality. 2. 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 Electronically Signed   By: Rise MuBenjamin  McClintock M.D.   On: 09/06/2018 22:12   Ct Cerebral Perfusion W Contrast  Result Date: 09/06/2018 CLINICAL DATA:  73 y/o F; seizure-like activity. Focal neural deficit. Code stroke. EXAM: CT HEAD WITHOUT CONTRAST CT ANGIOGRAPHY HEAD AND NECK CT PERFUSION  BRAIN TECHNIQUE: Multidetector CT imaging of the head and neck was performed using the standard protocol during bolus administration of intravenous contrast. Multiplanar CT image reconstructions and MIPs were obtained to evaluate the vascular anatomy. Carotid stenosis measurements (when applicable) are obtained utilizing NASCET criteria, using the distal internal carotid diameter as the denominator. Multiphase CT imaging of the brain was performed following IV bolus contrast injection. Subsequent parametric perfusion maps were calculated using RAPID software. CONTRAST:  100mL ISOVUE-370 IOPAMIDOL (ISOVUE-370) INJECTION 76% COMPARISON:  07/19/2018 CT head. 07/09/2018 MRI head. FINDINGS: CT HEAD FINDINGS Brain: No evidence of acute infarction, hemorrhage, hydrocephalus, extra-axial collection or mass lesion/mass effect. 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 Stable chronic microvascular ischemic changes and volume loss of the brain. Vascular: As below. Skull: Normal. Negative for fracture or focal lesion. Sinuses/Orbits: Small fluid levels within the paranasal sinuses. Normal aeration of mastoid air cells. Orbits are unremarkable. Other: None. ASPECTS (Alberta Stroke Program Early CT Score) - Ganglionic level infarction (caudate, lentiform nuclei, internal capsule, insula, M1-M3 cortex): 7 - Supraganglionic infarction (M4-M6 cortex): 3 Total score (0-10 with 10 being normal): 10 Review of the MIP images confirms the above findings CTA NECK FINDINGS Aortic arch: Standard branching. Imaged portion shows no evidence of aneurysm or dissection. No significant stenosis of the major arch vessel origins. Moderate mixed plaque of the aortic arch. Right carotid system: No evidence of dissection, stenosis (50% or greater) or occlusion. Predominant fibrofatty plaque of the carotid bifurcation with minimal less than 30% proximal ICA stenosis. Left carotid system:  No evidence of dissection, stenosis (50% or greater) or occlusion. Predominant fibrofatty plaque of the carotid bifurcation with mild less than 50% proximal ICA stenosis. Mild nonstenotic beaded irregularity of the left mid cervical ICA. Vertebral arteries: Codominant. No evidence of dissection, stenosis (50% or greater) or occlusion. Segments of non stenotic beaded irregularity of the vertebral arteries. Skeleton: Moderate severe spondylosis of the cervical spine. Multilevel uncovertebral and facet hypertrophy with bony foraminal encroachment. Spinal canal stenosis is greatest at the C6-7 level where it is mild-to-moderate. Grade 1 C7-T1 anterolisthesis. Other neck: Negative. Upper chest: Mild centrilobular emphysema of the lung apices. Review of the MIP images confirms the above findings CTA HEAD FINDINGS Anterior circulation: No significant stenosis, proximal occlusion, aneurysm, or vascular malformation. Non stenotic calcific atherosclerosis of carotid siphons. Posterior circulation: No significant stenosis, proximal occlusion, aneurysm, or vascular malformation. Mild right P2 stenosis. Venous sinuses: As permitted by contrast timing, patent. Anatomic variants: Complete circle-of-Willis. Right fetal PCA. Review of the MIP images confirms the above findings CT Brain Perfusion Findings: CBF (<30%) Volume: 0mL Perfusion (Tmax>6.0s) volume: 0mL Mismatch Volume: 0mL Infarction Location:Negative. IMPRESSION: CT head: 1. No acute intracranial abnormality identified.  ASPECTS is 10. 2. Stable chronic microvascular ischemic changes and volume loss of the brain. 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 CT perfusion head: Negative CT  perfusion head. No acute stroke or ischemic penumbra by RAPID perfusion criteria. CTA neck: 1. Patent carotid and vertebral arteries. No dissection, aneurysm, or high-grade stenosis by NASCET criteria. 2. Predominant fibrofatty plaque of bilateral  carotid bifurcations with mild less than 50% proximal ICA stenosis. 3. 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 4. Moderate severe spondylosis of the cervical spine greatest at C6-7. CTA head: Patent anterior and posterior intracranial circulation. No large vessel occlusion, aneurysm, or significant stenosis is identified. These results were called by telephone at the time of interpretation on 09/06/2018 at 1:06 am to Dr. Bayard Males , who verbally acknowledged these results. Electronically Signed   By: Mitzi Hansen M.D.   On: 09/06/2018 01:09   Dg Chest Port 1 View  Result Date: 09/15/2018 CLINICAL DATA:  Possible aspiration. EXAM: PORTABLE CHEST 1 VIEW COMPARISON:  September 15, 2018 FINDINGS: Again noted is calcified nodularity in the lateral left chest. The heart, hila, mediastinum, lungs, and pleura are otherwise unchanged. Possible mild atelectasis in the left base. IMPRESSION: No active disease. Electronically Signed   By: Gerome Sam III M.D   On: 09/15/2018 15:35   Dg Chest Port 1 View  Result Date: 09/15/2018 CLINICAL DATA:  73 year old female with altered mental status. Found down. Seizure. Cough. EXAM: PORTABLE CHEST 1 VIEW COMPARISON:  09/13/2018 and earlier. FINDINGS: Portable AP semi upright view at 1129 hours. Stable lung volumes and mediastinal contours. Mildly tortuous descending aorta. Allowing for portable technique the lungs are clear. Visualized tracheal air column is within normal limits. Negative visible bowel gas pattern. Subacute to chronic right lateral rib and left clavicle fractures. IMPRESSION: No acute cardiopulmonary abnormality. Electronically Signed   By: Odessa Fleming M.D.   On: 09/15/2018 13:12   Dg Chest Portable 1 View  Result Date: 09/13/2018 CLINICAL DATA:  Patient found unresponsive today. EXAM: PORTABLE CHEST 1 VIEW COMPARISON:  PA and lateral chest 09/04/2018. Single-view of the chest 10/29/2017.  FINDINGS: The lungs are clear. Heart size is normal. Aortic atherosclerosis noted. No pneumothorax or pleural effusion. No acute bony abnormality. Remote right sixth and seventh rib fracture identified. Remote left clavicle fracture also noted. IMPRESSION: No acute disease. Atherosclerosis. Electronically Signed   By: Drusilla Kanner M.D.   On: 09/13/2018 14:26   Ct Head Code Stroke Wo Contrast  Result Date: 09/06/2018 CLINICAL DATA:  73 y/o F; seizure-like activity. Focal neural deficit. Code stroke. EXAM: CT HEAD WITHOUT CONTRAST CT ANGIOGRAPHY HEAD AND NECK CT PERFUSION BRAIN TECHNIQUE: Multidetector CT imaging of the head and neck was performed using the standard protocol during bolus administration of intravenous contrast. Multiplanar CT image reconstructions and MIPs were obtained to evaluate the vascular anatomy. Carotid stenosis measurements (when applicable) are obtained utilizing NASCET criteria, using the distal internal carotid diameter as the denominator. Multiphase CT imaging of the brain was performed following IV bolus contrast injection. Subsequent parametric perfusion maps were calculated using RAPID software. CONTRAST:  ISOVUE-370 IOPAMIDOL (ISOVUE-370) INJECTION 76% COMPARISON:  07/19/2018 CT head. 07/09/2018 MRI head. FINDINGS: CT HEAD FINDINGS Brain: No evidence of acute infarction, hemorrhage, hydrocephalus, extra-axial collection or mass lesion/mass effect. 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 Stable chronic microvascular ischemic changes and volume loss of the brain. Vascular: As below. Skull: Normal. Negative for fracture or focal lesion. Sinuses/Orbits: Small fluid levels within the paranasal sinuses. Normal aeration of mastoid air cells. Orbits are unremarkable. Other: None. ASPECTS (  [place] Stroke Program Early CT Score) - Ganglionic level infarction (caudate, lentiform nuclei, internal capsule, insula,  M1-M3 cortex): 7 - Supraganglionic infarction (M4-M6 cortex): 3 Total score (0-10 with 10 being normal): 10 Review of the MIP images confirms the above findings CTA NECK FINDINGS Aortic arch: Standard branching. Imaged portion shows no evidence of aneurysm or dissection. No significant stenosis of the major arch vessel origins. Moderate mixed plaque of the aortic arch. Right carotid system: No evidence of dissection, stenosis (50% or greater) or occlusion. Predominant fibrofatty plaque of the carotid bifurcation with minimal less than 30% proximal ICA stenosis. Left carotid system: No evidence of dissection, stenosis (50% or greater) or occlusion. Predominant fibrofatty plaque of the carotid bifurcation with mild less than 50% proximal ICA stenosis. Mild nonstenotic beaded irregularity of the left mid cervical ICA. Vertebral arteries: Codominant. No evidence of dissection, stenosis (50% or greater) or occlusion. Segments of non stenotic beaded irregularity of the vertebral arteries. Skeleton: Moderate severe spondylosis of the cervical spine. Multilevel uncovertebral and facet hypertrophy with bony foraminal encroachment. Spinal canal stenosis is greatest at the C6-7 level where it is mild-to-moderate. Grade 1 C7-T1 anterolisthesis. Other neck: Negative. Upper chest: Mild centrilobular emphysema of the lung apices. Review of the MIP images confirms the above findings CTA HEAD FINDINGS Anterior circulation: No significant stenosis, proximal occlusion, aneurysm, or vascular malformation. Non stenotic calcific atherosclerosis of carotid siphons. Posterior circulation: No significant stenosis, proximal occlusion, aneurysm, or vascular malformation. Mild right P2 stenosis. Venous sinuses: As permitted by contrast timing, patent. Anatomic variants: Complete circle-of-Willis. Right fetal PCA. Review of the MIP images confirms the above findings CT Brain Perfusion Findings: CBF (<30%) Volume: 16mL Perfusion (Tmax>6.0s)  volume: 60mL Mismatch Volume: 4mL Infarction Location:Negative. IMPRESSION: CT head: 1. No acute intracranial abnormality identified.  ASPECTS is 10. 2. Stable chronic microvascular ischemic changes and volume loss of the brain. 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 CT perfusion head: Negative CT perfusion head. No acute stroke or ischemic penumbra by RAPID perfusion criteria. CTA neck: 1. Patent carotid and vertebral arteries. No dissection, aneurysm, or high-grade stenosis by NASCET criteria. 2. Predominant fibrofatty plaque of bilateral carotid bifurcations with mild less than 50% proximal ICA stenosis. 3. 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 4. Moderate severe spondylosis of the cervical spine greatest at C6-7. CTA head: Patent anterior and posterior intracranial circulation. No large vessel occlusion, aneurysm, or significant stenosis is identified. These results were called by telephone at the time of interpretation on 09/06/2018 at 1:06 am to Dr. Bayard Males , who verbally acknowledged these results. Electronically Signed   By: Mitzi Hansen M.D.   On: 09/06/2018 01:09     CBC Recent Labs  Lab 09/09/18 0504 09/13/18 1435 09/14/18 0445  WBC 7.5 14.0* 11.7*  HGB 9.4* 13.5 11.0*  HCT 30.0* 43.2 35.1*  PLT 180 284 213  MCV 95.2 97.3 96.2  MCH 29.8 30.4 30.1  MCHC 31.3 31.3 31.3  RDW 16.1* 16.1* 15.8*  LYMPHSABS  --  1.6  --   MONOABS  --  1.0  --   EOSABS  --  0.2  --   BASOSABS  --  0.1  --     Chemistries  Recent Labs  Lab 09/13/18 1435 09/14/18 0445 09/15/18 0458  NA 140 141 140  K 5.2* 4.6 4.1  CL 105 109 109  CO2 25 25 25   GLUCOSE 109* 149* 140*  BUN 48* 43* 32*  CREATININE 1.87* 1.71* 1.39*  CALCIUM 9.4 8.4* 8.5*  AST 20 15  --   ALT 10 8  --   ALKPHOS 84 73  --   BILITOT 0.6 0.5  --     ------------------------------------------------------------------------------------------------------------------ estimated creatinine clearance is 31.1 mL/min (A) (by C-G formula based on SCr of 1.39 mg/dL (H)). ------------------------------------------------------------------------------------------------------------------ No results for input(s): HGBA1C in the last 72 hours. ------------------------------------------------------------------------------------------------------------------ No results for input(s): CHOL, HDL, LDLCALC, TRIG, CHOLHDL, LDLDIRECT in the last 72 hours. ------------------------------------------------------------------------------------------------------------------ No results for input(s): TSH, T4TOTAL, T3FREE, THYROIDAB in the last 72 hours.  Invalid input(s): FREET3 ------------------------------------------------------------------------------------------------------------------ Recent Labs    09/14/18 1312  VITAMINB12 480    Coagulation profile No results for input(s): INR, PROTIME in the last 168 hours.  No results for input(s): DDIMER in the last 72 hours.  Cardiac Enzymes No results for input(s): CKMB, TROPONINI, MYOGLOBIN in the last 168 hours.  Invalid input(s): CK ------------------------------------------------------------------------------------------------------------------ Invalid input(s): POCBNP    Assessment & Plan  Pt is 72 with recent hospitalization for sz and uti  *Acute encephalopathy could be from breakthrough seizures Rapid response called Ativan 2 mg IV stat given Fosphenytoin 1 g IV was given after discussing with neurology Plan is to continue Keppra 500 mg twice a day and Vimpat 200 mg twice a day.  Could not increase the dose of Keppra because of her impaired renal function EEG will be obtained on Monday Ammonia levels normal B12 levels normal Her urine culture is not growing any bacteria She is afebrile, WBC was  minimally elevated there does not seem to be any evidence of meningitis Due to no improvement rounding physician is planning to order MRI of the brain, patient has had a normal MRI recently   *Aspiration pneumonitis Chest x-ray ordered stat Aspiration precautions IV Unasyn Bronchodilator treatments as needed   *UTI- ruled out with negative cultures discontinue IV Rocephin as patient is started on IV Unasyn  hypertension: Blood pressure meds on hold , as patient is encephalopathic  * Atrial fibrillation: Rate controlled; Eliquis on hold due to patient unable to take oral meds   * History of stroke: The patient has a history of falls and likely has gait disturbance secondary to her remote strokes.   * DVT prophylaxis: Eliquis  * GI prophylaxis: None  Plan of care discussed in detail with the patient's daughter at bedside.  Also discussed with RN and nursing supervisor.  Call placed and discussed with on-call neurologist     Code Status Orders  (From admission, onward)         Start     Ordered   09/13/18 1822  Full code  Continuous     09/13/18 1822        Code Status History    Date Active Date Inactive Code Status Order ID Comments User Context   09/08/2018 2254 09/09/2018 2026 DNR [id number]  Altamese DillingVachhani, Vaibhavkumar, MD Inpatient   09/06/2018 0728 09/08/2018 2254 Full Code [id number]  Arnaldo Nataliamond, Michael S, MD ED   08/28/2018 1307 08/31/2018 2356 Full Code [id number]  Ihor AustinPyreddy, Pavan, MD Inpatient   07/06/2018 1628 07/09/2018 2237 Full Code [id number]  Altamese DillingVachhani, Vaibhavkumar, MD ED   06/24/2018 1648 07/03/2018 1942 Full Code [id number]  Adrian SaranMody, Sital, MD Inpatient   05/20/2018 1847 05/21/2018 2212 Full Code [id number]  Ramonita LabGouru, Itzabella Sorrels, MD Inpatient   05/20/2018 1847 05/20/2018 1847 Full Code [id number]  Ramonita LabGouru, Aero Drummonds, MD Inpatient   12/31/2017 1538 01/04/2018 2130 DNR [id number]  Milagros Loll, MD ED   12/13/2017 1630 12/15/2017 2016 Full Code [id number]  Alford Highland, MD ED   11/22/2017 0202  11/23/2017 2009 DNR [id number]  Salary, Evelena Asa, MD ED   11/22/2017 0150 11/22/2017 0202 Full Code [id number]  Salary, Evelena Asa, MD ED   11/01/2017 1026 11/02/2017 0155 DNR [id number]  Delfino Lovett, MD Inpatient   10/29/2017 1256 11/01/2017 1026 Full Code [id number]  Alford Highland, MD ED           Consults neurology   DVT Prophylaxis  Lovenox   Lab Results  Component Value Date   PLT 213 09/14/2018     Time critical care spent in minutes   35 minutes  Greater than 50% of time spent in care coordination and counseling patient regarding the condition and plan of care.   Ramonita Lab M.D on 09/15/2018 at 4:00 PM  Between 7am to 6pm - Pager - [phone]  After 6pm go to [email or web address] - Social research officer, government  [employer]   Office  [phone]

## 2018-09-15 NOTE — Progress Notes (Addendum)
Spoke with Grier Mitts RN and  RN charge nurse re PICC to be placed in am.  Pt currently has 2 PIV's, sufficient for meds ordered at this time.  Pt currently post-ictal per progress notes and RN, status- post Rapid Response call at 1600.  Notified PICC will be placed in am if pt stable.

## 2018-09-15 NOTE — Progress Notes (Signed)
Brief History  Destiny Mack is an 73 y.o. 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  In the emergency room patient was found to have a UTI and elevated Cr, she was started on antibiotics, CT head showed no acute changes, she was admitted to the hospital for metabolic encephalopathy/UTI/breakthrough seizures. Patient was recently admitted to the hospital for the same reason, she has neurological evaluation with an extensive neurological work-up for seizures she was discharged on Keppra 500 mg twice a day and according to her daughter she is compliant with medication. Patient had a recent MRI of the brain on September 06, 2018 showed no acute intracranial abnormalities. According to the daughter patient was not complaining about any chest pain, any shortness of breath, she did not have any fever, she did not have any nausea or vomiting.   Subjective  EEG is pending   Past Medical History Past Medical History:  Diagnosis Date  . A-fib (HCC)   . Depression   . Frequent falls   . Hypertension   . Leaking of urine   . Mixed incontinence   . Stroke (HCC)   . Tremor of hands and face     Past Surgical History Past Surgical History:  Procedure Laterality Date  . APPENDECTOMY    . TEE WITHOUT CARDIOVERSION N/A 01/04/2018   Procedure: TRANSESOPHAGEAL ECHOCARDIOGRAM (TEE);  Surgeon: Lamar Blinks, MD;  Location: [facility] ORS;  Service: Cardiovascular;  Laterality: N/A;  . TUBAL LIGATION      Allergies No Known Allergies  Home Medications Medications Prior to Admission  Medication Sig Dispense Refill  . acetaminophen (TYLENOL) 500 MG tablet Take 500 mg by mouth 3 (three) times daily.    Marland Kitchen amLODipine (NORVASC) 5 MG tablet Take 5 mg by  mouth daily.    Marland Kitchen apixaban (ELIQUIS) 5 MG TABS tablet Take 1 tablet (5 mg total) by mouth 2 (two) times daily. 60 tablet 1  . atorvastatin (LIPITOR) 20 MG tablet Take 20 mg by mouth at bedtime.     . levETIRAcetam (KEPPRA) 500 MG tablet Take 1 tablet (500 mg total) by mouth 2 (two) times daily. 60 tablet 1  . nicotine (NICODERM CQ - DOSED IN MG/24 HOURS) 21 mg/24hr patch Place 1 patch (21 mg total) onto the skin daily. 14 patch 0  . senna-docusate (SENOKOT-S) 8.6-50 MG tablet Take 1 tablet by mouth at bedtime as needed for mild constipation.    . sertraline (ZOLOFT) 50 MG tablet Take 50 mg by mouth daily.      Hospital Medications . chlorhexidine  15 mL Mouth Rinse BID  . mouth rinse  15 mL Mouth Rinse q12n4p  . modafinil  100 mg Oral Daily     Objective  Still somnolent, no clinical sz activities reported   Intake/Output from previous day: 01/18 0701 - 01/19 0700 In: 2399.4 [I.V.:2030.5; IV Piggyback:368.9] Out: 900 [Urine:900] Intake/Output this shift: No intake/output data recorded. Nutritional status:  Diet Order            Diet NPO time specified  Diet effective now               Physical Exam Vitals:   09/14/18 1927 09/14/18 2330 09/15/18 0408 09/15/18 0850  BP: (!) 104/57 (!) 155/65 129/69  Pulse: 68 100 73   Resp: 17 18 18    Temp: 98.6 F (37 C) 98.3 F (36.8 C)    TempSrc: Oral Oral    SpO2: 96% 91% 97% 100%   General - NAD  Heart - Irregular  Lungs - Decreased BS B/L Abdomen - Soft - non tender Extremities - Distal pulses intact - no edema Skin - Warm and dry  Neurologic Exam:   Mental Status: Somnolent but arousable to voice, open her eyes, oriented to name, slow psychomotor, following commands with constant stimulation. low speech output  Cranial Nerves: Blinks to threat B/L, face symmetric  Motor: Moves all 4 extremities equally     LABORATORY RESULTS:  Basic Metabolic Panel: Recent Labs  Lab 09/13/18 1435 09/14/18 0445  09/15/18 0458  NA 140 141 140  K 5.2* 4.6 4.1  CL 105 109 109  CO2 25 25 25   GLUCOSE 109* 149* 140*  BUN 48* 43* 32*  CREATININE 1.87* 1.71* 1.39*  CALCIUM 9.4 8.4* 8.5*    Liver Function Tests: Recent Labs  Lab 09/13/18 1435 09/14/18 0445  AST 20 15  ALT 10 8  ALKPHOS 84 73  BILITOT 0.6 0.5  PROT 8.1 6.6  ALBUMIN 3.9 3.2*   No results for input(s): LIPASE, AMYLASE in the last 168 hours. Recent Labs  Lab 09/14/18 1441  AMMONIA 16    CBC: Recent Labs  Lab 09/09/18 0504 09/13/18 1435 09/14/18 0445  WBC 7.5 14.0* 11.7*  NEUTROABS  --  11.1*  --   HGB 9.4* 13.5 11.0*  HCT 30.0* 43.2 35.1*  MCV 95.2 97.3 96.2  PLT 180 284 213    Cardiac Enzymes: No results for input(s): CKTOTAL, CKMB, CKMBINDEX, TROPONINI in the last 168 hours.  Lipid Panel: No results for input(s): CHOL, TRIG, HDL, CHOLHDL, VLDL, LDLCALC in the last 168 hours.  CBG: Recent Labs  Lab 09/14/18 0742 09/14/18 1140 09/14/18 1402 09/14/18 2330  GLUCAP 135* 85 83 132*    Microbiology:   Coagulation Studies: No results for input(s): LABPROT, INR in the last 72 hours.  Miscellaneous Labs:   IMAGING RESULTS Ct Head Wo Contrast  Result Date: 09/13/2018 CLINICAL DATA:  Altered mental status EXAM: CT HEAD WITHOUT CONTRAST TECHNIQUE: Contiguous axial images were obtained from the base of the skull through the vertex without intravenous contrast. COMPARISON:  09/06/2018 FINDINGS: Brain: No evidence of acute infarction, hemorrhage, extra-axial collection, ventriculomegaly, or mass effect. Generalized cerebral atrophy. Periventricular white matter low attenuation likely secondary to microangiopathy. Vascular: Cerebrovascular atherosclerotic calcifications are noted. Skull: Negative for fracture or focal lesion. Sinuses/Orbits: Visualized portions of the orbits are unremarkable. Mastoid sinuses are clear. Air-fluid level in the left sphenoid sinus. Other: None. IMPRESSION: 1. No acute intracranial  pathology. 2. Chronic microvascular disease and cerebral atrophy. Electronically Signed   By: Elige KoHetal  Patel   On: 09/13/2018 17:26   Dg Chest Portable 1 View  Result Date: 09/13/2018 CLINICAL DATA:  Patient found unresponsive today. EXAM: PORTABLE CHEST 1 VIEW COMPARISON:  PA and lateral chest 09/04/2018. Single-view of the chest 10/29/2017. FINDINGS: The lungs are clear. Heart size is normal. Aortic atherosclerosis noted. No pneumothorax or pleural effusion. No acute bony abnormality. Remote right sixth and seventh rib fracture identified. Remote left clavicle fracture also noted. IMPRESSION: No acute disease. Atherosclerosis. Electronically Signed   By: Drusilla Kannerhomas  Dalessio M.D.   On: 09/13/2018 14:26     EEG is pending      Assessment/Plan:  73 y.o. 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  Most likely her UTI and elevated creatinine has decreased her seizure threshold.  Plan  - Continue Keppra 500 MG BID, it can not be increased due to her abnormal Cr Cl ( Cr improving but still elevated)   -Continue Vimpat 200 mg BID -EEG is pending ( Monday)  -Since she had a recent negative MRI brain 09/06/2018, no need to repeat MRI unless pt mental state doesn't improve with treating her UTI and correcting the metabolic derangements in the next couple of days. -PT/OT and speech evaluation     Jonna Munro, MD

## 2018-09-15 NOTE — Progress Notes (Signed)
[employer] [facility] [place] [facility]                                                                                                                                                                                  Patient Demographics   Destiny Mack, is a 73 y.o. female, DOB - 1946/08/02, ZOX:[id number]  Admit date - 09/13/2018   Admitting Physician Ramonita Lab, MD  Outpatient Primary MD for the patient is Bender, Earl Lagos, MD   LOS - 2  Subjective: Patient remains drowsy.  I am able to arouse her but she continues to be very sleepy  Review of Systems:   CONSTITUTIONAL: Drowsy Vitals:   Vitals:   09/14/18 1927 09/14/18 2330 09/15/18 0408 09/15/18 0850  BP: (!) 104/57 (!) 155/65 129/69   Pulse: 68 100 73   Resp: 17 18 18    Temp: 98.6 F (37 C) 98.3 F (36.8 C)    TempSrc: Oral Oral    SpO2: 96% 91% 97% 100%    Wt Readings from Last 3 Encounters:  09/08/18 61.3 kg  09/04/18 58.1 kg  08/28/18 58.1 kg     Intake/Output Summary (Last 24 hours) at 09/15/2018 1055 Last data filed at 09/15/2018 0654 Gross per 24 hour  Intake 2399.42 ml  Output 900 ml  Net 1499.42 ml    Physical Exam:   GENERAL: Ill-appearing HEAD, EYES, EARS, NOSE AND THROAT: Atraumatic, normocephalic. Extraocular muscles are intact. Pupils equal and reactive to light. Sclerae anicteric. No conjunctival injection. No oro-pharyngeal erythema.  NECK: Supple. There is no jugular venous distention. No bruits, no lymphadenopathy, no thyromegaly.  HEART: Regular rate and rhythm,. No murmurs, no rubs, no clicks.  LUNGS: Rhonchus breath sound ABDOMEN: Soft, flat, nontender, nondistended. Has good bowel sounds. No hepatosplenomegaly appreciated.  EXTREMITIES: No evidence of any cyanosis, clubbing, or peripheral edema.  +2 pedal and radial pulses bilaterally.  NEUROLOGIC: Patient drowsy sKIN: Moist and warm with no rashes appreciated.  Psych: Not anxious, depressed LN: No inguinal LN  enlargement    Antibiotics   Anti-infectives (From admission, onward)   Start     Dose/Rate Route Frequency Ordered Stop   09/14/18 1800  cefTRIAXone (ROCEPHIN) 1 g in sodium chloride 0.9 % 100 mL IVPB     1 g 200 mL/hr over 30 Minutes Intravenous Every 24 hours 09/13/18 1812     09/13/18 1630  cefTRIAXone (ROCEPHIN) 1 g in sodium chloride 0.9 % 100 mL IVPB     1 g 200 mL/hr over 30 Minutes Intravenous  Once 09/13/18 1618 09/13/18 1827      Medications   Scheduled Meds: . chlorhexidine  15 mL Mouth Rinse BID  .  mouth rinse  15 mL Mouth Rinse q12n4p  . modafinil  100 mg Oral Daily   Continuous Infusions: . sodium chloride Stopped (09/14/18 1356)  . cefTRIAXone (ROCEPHIN)  IV Stopped (09/14/18 1755)  . dextrose 5 % and 0.45% NaCl 100 mL/hr at 09/15/18 0500  . lacosamide (VIMPAT) IV Stopped (09/14/18 2255)  . levETIRAcetam 500 mg (09/15/18 0803)   PRN Meds:.sodium chloride, acetaminophen **OR** acetaminophen, ondansetron **OR** ondansetron (ZOFRAN) IV   Data Review:   Micro Results Recent Results (from the past 240 hour(s))  Urine culture     Status: Abnormal   Collection Time: 09/06/18 12:53 AM  Result Value Ref Range Status   Specimen Description URINE, RANDOM  Final   Special Requests   Final    NONE Performed at [facility], [address] [place], [place] [address]    Culture 10,000 COLONIES/mL YEAST (A)  Final   Report Status 09/07/2018 FINAL  Final  Urine Culture     Status: None   Collection Time: 09/13/18  2:35 PM  Result Value Ref Range Status   Specimen Description   Final    URINE, RANDOM Performed at [facility], [address], [place], [place] [address]    Special Requests   Final    NONE Performed at [facility], [address], [place], [place] [address]    Culture   Final    NO GROWTH Performed at [facility], [address] [place], [place] [address]    Report Status 09/14/2018 FINAL  Final     Radiology Reports Ct Angio Head W Or Wo Contrast  Result Date: 09/06/2018 CLINICAL DATA:  73 y/o F; seizure-like activity. Focal neural deficit. Code stroke. EXAM: CT HEAD WITHOUT CONTRAST CT ANGIOGRAPHY HEAD AND NECK CT PERFUSION BRAIN TECHNIQUE: Multidetector CT imaging of the head and neck was performed using the standard protocol during bolus administration of intravenous contrast. Multiplanar CT image reconstructions and MIPs were obtained to evaluate the vascular anatomy. Carotid stenosis measurements (when applicable) are obtained utilizing NASCET criteria, using the distal internal carotid diameter as the denominator. Multiphase CT imaging of the brain was performed following IV bolus contrast injection. Subsequent parametric perfusion maps were calculated using RAPID software. CONTRAST:  ISOVUE-370 IOPAMIDOL (ISOVUE-370) INJECTION 76% COMPARISON:  07/19/2018 CT head. 07/09/2018 MRI head. FINDINGS: CT HEAD FINDINGS Brain: No evidence of acute infarction, hemorrhage, hydrocephalus, extra-axial collection or mass lesion/mass effect. 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 Stable chronic microvascular ischemic changes and volume loss of the brain. Vascular: As below. Skull: Normal. Negative for fracture or focal lesion. Sinuses/Orbits: Small fluid levels within the paranasal sinuses. Normal aeration of mastoid air cells. Orbits are unremarkable. Other: None. ASPECTS (Alberta Stroke Program Early CT Score) - Ganglionic level infarction (caudate, lentiform nuclei, internal capsule, insula, M1-M3 cortex): 7 - Supraganglionic infarction (M4-M6 cortex): 3 Total score (0-10 with 10 being normal): 10 Review of the MIP images confirms the above findings CTA NECK FINDINGS Aortic arch: Standard branching. Imaged portion shows no evidence of aneurysm or dissection. No significant stenosis of the major arch vessel origins. Moderate mixed  plaque of the aortic arch. Right carotid system: No evidence of dissection, stenosis (50% or greater) or occlusion. Predominant fibrofatty plaque of the carotid bifurcation with minimal less than 30% proximal ICA stenosis. Left carotid system: No evidence of dissection, stenosis (50% or greater) or occlusion. Predominant fibrofatty plaque of the carotid bifurcation  with mild less than 50% proximal ICA stenosis. Mild nonstenotic beaded irregularity of the left mid cervical ICA. Vertebral arteries: Codominant. No evidence of dissection, stenosis (50% or greater) or occlusion. Segments of non stenotic beaded irregularity of the vertebral arteries. Skeleton: Moderate severe spondylosis of the cervical spine. Multilevel uncovertebral and facet hypertrophy with bony foraminal encroachment. Spinal canal stenosis is greatest at the C6-7 level where it is mild-to-moderate. Grade 1 C7-T1 anterolisthesis. Other neck: Negative. Upper chest: Mild centrilobular emphysema of the lung apices. Review of the MIP images confirms the above findings CTA HEAD FINDINGS Anterior circulation: No significant stenosis, proximal occlusion, aneurysm, or vascular malformation. Non stenotic calcific atherosclerosis of carotid siphons. Posterior circulation: No significant stenosis, proximal occlusion, aneurysm, or vascular malformation. Mild right P2 stenosis. Venous sinuses: As permitted by contrast timing, patent. Anatomic variants: Complete circle-of-Willis. Right fetal PCA. Review of the MIP images confirms the above findings CT Brain Perfusion Findings: CBF (<30%) Volume: 0mL Perfusion (Tmax>6.0s) volume: 0mL Mismatch Volume: 0mL Infarction Location:Negative. IMPRESSION: CT head: 1. No acute intracranial abnormality identified.  ASPECTS is 10. 2. Stable chronic microvascular ischemic changes and volume loss of the brain. 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 CT perfusion head: Negative CT  perfusion head. No acute stroke or ischemic penumbra by RAPID perfusion criteria. CTA neck: 1. Patent carotid and vertebral arteries. No dissection, aneurysm, or high-grade stenosis by NASCET criteria. 2. Predominant fibrofatty plaque of bilateral carotid bifurcations with mild less than 50% proximal ICA stenosis. 3. 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 4. Moderate severe spondylosis of the cervical spine greatest at C6-7. CTA head: Patent anterior and posterior intracranial circulation. No large vessel occlusion, aneurysm, or significant stenosis is identified. These results were called by telephone at the time of interpretation on 09/06/2018 at 1:06 am to Dr. Bayard MalesANDOLPH BROWN , who verbally acknowledged these results. Electronically Signed   By: Mitzi HansenLance  Furusawa-Stratton M.D.   On: 09/06/2018 01:09   Dg Chest 2 View  Result Date: 09/04/2018 CLINICAL DATA:  Altered mental status EXAM: CHEST - 2 VIEW COMPARISON:  07/19/2018 FINDINGS: The heart size and mediastinal contours are within normal limits. Both lungs are clear. Old left clavicle fracture. IMPRESSION: No active cardiopulmonary disease. Electronically Signed   By: Jasmine PangKim  Fujinaga M.D.   On: 09/04/2018 16:26   Ct Head Wo Contrast  Result Date: 09/13/2018 CLINICAL DATA:  Altered mental status EXAM: CT HEAD WITHOUT CONTRAST TECHNIQUE: Contiguous axial images were obtained from the base of the skull through the vertex without intravenous contrast. COMPARISON:  09/06/2018 FINDINGS: Brain: No evidence of acute infarction, hemorrhage, extra-axial collection, ventriculomegaly, or mass effect. Generalized cerebral atrophy. Periventricular white matter low attenuation likely secondary to microangiopathy. Vascular: Cerebrovascular atherosclerotic calcifications are noted. Skull: Negative for fracture or focal lesion. Sinuses/Orbits: Visualized portions of the orbits are unremarkable. Mastoid sinuses are  clear. Air-fluid level in the left sphenoid sinus. Other: None. IMPRESSION: 1. No acute intracranial pathology. 2. Chronic microvascular disease and cerebral atrophy. Electronically Signed   By: Elige KoHetal  Angelos Wasco   On: 09/13/2018 17:26   Ct Angio Neck W Or Wo Contrast  Result Date: 09/06/2018 CLINICAL DATA:  73 y/o F; seizure-like activity. Focal neural deficit. Code stroke. EXAM: CT HEAD WITHOUT CONTRAST CT ANGIOGRAPHY HEAD AND NECK CT PERFUSION BRAIN TECHNIQUE: Multidetector CT imaging of the head and neck was performed using the standard protocol during bolus administration of intravenous contrast. Multiplanar CT image reconstructions  and MIPs were obtained to evaluate the vascular anatomy. Carotid stenosis measurements (when applicable) are obtained utilizing NASCET criteria, using the distal internal carotid diameter as the denominator. Multiphase CT imaging of the brain was performed following IV bolus contrast injection. Subsequent parametric perfusion maps were calculated using RAPID software. CONTRAST:  100mL ISOVUE-370 IOPAMIDOL (ISOVUE-370) INJECTION 76% COMPARISON:  07/19/2018 CT head. 07/09/2018 MRI head. FINDINGS: CT HEAD FINDINGS Brain: No evidence of acute infarction, hemorrhage, hydrocephalus, extra-axial collection or mass lesion/mass effect. 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 Stable chronic microvascular ischemic changes and volume loss of the brain. Vascular: As below. Skull: Normal. Negative for fracture or focal lesion. Sinuses/Orbits: Small fluid levels within the paranasal sinuses. Normal aeration of mastoid air cells. Orbits are unremarkable. Other: None. ASPECTS (Alberta Stroke Program Early CT Score) - Ganglionic level infarction (caudate, lentiform nuclei, internal capsule, insula, M1-M3 cortex): 7 - Supraganglionic infarction (M4-M6 cortex): 3 Total score (0-10 with 10 being normal): 10 Review of the MIP images  confirms the above findings CTA NECK FINDINGS Aortic arch: Standard branching. Imaged portion shows no evidence of aneurysm or dissection. No significant stenosis of the major arch vessel origins. Moderate mixed plaque of the aortic arch. Right carotid system: No evidence of dissection, stenosis (50% or greater) or occlusion. Predominant fibrofatty plaque of the carotid bifurcation with minimal less than 30% proximal ICA stenosis. Left carotid system: No evidence of dissection, stenosis (50% or greater) or occlusion. Predominant fibrofatty plaque of the carotid bifurcation with mild less than 50% proximal ICA stenosis. Mild nonstenotic beaded irregularity of the left mid cervical ICA. Vertebral arteries: Codominant. No evidence of dissection, stenosis (50% or greater) or occlusion. Segments of non stenotic beaded irregularity of the vertebral arteries. Skeleton: Moderate severe spondylosis of the cervical spine. Multilevel uncovertebral and facet hypertrophy with bony foraminal encroachment. Spinal canal stenosis is greatest at the C6-7 level where it is mild-to-moderate. Grade 1 C7-T1 anterolisthesis. Other neck: Negative. Upper chest: Mild centrilobular emphysema of the lung apices. Review of the MIP images confirms the above findings CTA HEAD FINDINGS Anterior circulation: No significant stenosis, proximal occlusion, aneurysm, or vascular malformation. Non stenotic calcific atherosclerosis of carotid siphons. Posterior circulation: No significant stenosis, proximal occlusion, aneurysm, or vascular malformation. Mild right P2 stenosis. Venous sinuses: As permitted by contrast timing, patent. Anatomic variants: Complete circle-of-Willis. Right fetal PCA. Review of the MIP images confirms the above findings CT Brain Perfusion Findings: CBF (<30%) Volume: 0mL Perfusion (Tmax>6.0s) volume: 0mL Mismatch Volume: 0mL Infarction Location:Negative. IMPRESSION: CT head: 1. No acute intracranial abnormality identified.   ASPECTS is 10. 2. Stable chronic microvascular ischemic changes and volume loss of the brain. 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 CT perfusion head: Negative CT perfusion head. No acute stroke or ischemic penumbra by RAPID perfusion criteria. CTA neck: 1. Patent carotid and vertebral arteries. No dissection, aneurysm, or high-grade stenosis by NASCET criteria. 2. Predominant fibrofatty plaque of bilateral carotid bifurcations with mild less than 50% proximal ICA stenosis. 3. 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 4. Moderate severe spondylosis of the cervical spine greatest at C6-7. CTA head: Patent anterior and posterior intracranial circulation. No large vessel occlusion, aneurysm, or significant stenosis is identified. These results were called by telephone at the time of interpretation on 09/06/2018 at 1:06 am to Dr. Bayard MalesANDOLPH BROWN , who verbally acknowledged these results. Electronically Signed  By: Mitzi Hansen M.D.   On: 09/06/2018 01:09   Mr Brain Wo Contrast  Result Date: 09/06/2018 CLINICAL DATA:  Initial evaluation for acute syncope, history of atrial fibrillation. Evaluate for stroke. EXAM: MRI HEAD WITHOUT CONTRAST TECHNIQUE: Multiplanar, multiecho pulse sequences of the brain and surrounding structures were obtained without intravenous contrast. COMPARISON:  Comparison made with prior CTs from earlier the same day. FINDINGS: Brain: Diffuse prominence of the CSF containing spaces compatible with generalized age-related cerebral atrophy. Patchy and confluent T2/FLAIR hyperintensity within the periventricular deep white matter both cerebral hemispheres consistent with chronic microvascular ischemic disease, moderate to advanced in nature. Small chronic left occipital lobe infarct noted. Additional small chronic cortical infarct noted at the anterior left frontal lobe. Few tiny  remote left cerebellar infarcts. No abnormal foci of restricted diffusion to suggest acute or subacute ischemia. Gray-white matter differentiation maintained. No evidence for acute or chronic intracranial hemorrhage. No mass lesion, midline shift or mass effect. Ventricular prominence related to global parenchymal volume loss without hydrocephalus. No extra-axial fluid collection. Pituitary gland within normal limits. Vascular: Major intracranial vascular flow voids maintained. Skull and upper cervical spine: Craniocervical junction within normal limits. Multilevel degenerative spondylolysis noted within the upper cervical spine with resultant mild to moderate diffuse spinal stenosis. Bone marrow signal intensity within normal limits. No scalp soft tissue abnormality. Sinuses/Orbits: Globes and orbital soft tissues within normal limits. Mild scattered mucosal thickening throughout the paranasal sinuses. No air-fluid level to suggest acute sinusitis. Trace left mastoid effusion noted, of doubtful significance. Other: None. IMPRESSION: 1. No acute intracranial abnormality. 2. 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 Electronically Signed   By: Rise Mu M.D.   On: 09/06/2018 22:12   Ct Cerebral Perfusion W Contrast  Result Date: 09/06/2018 CLINICAL DATA:  73 y/o F; seizure-like activity. Focal neural deficit. Code stroke. EXAM: CT HEAD WITHOUT CONTRAST CT ANGIOGRAPHY HEAD AND NECK CT PERFUSION BRAIN TECHNIQUE: Multidetector CT imaging of the head and neck was performed using the standard protocol during bolus administration of intravenous contrast. Multiplanar CT image reconstructions and MIPs were obtained to evaluate the vascular anatomy. Carotid stenosis measurements (when applicable) are obtained utilizing NASCET criteria, using the distal internal carotid diameter as the denominator.  Multiphase CT imaging of the brain was performed following IV bolus contrast injection. Subsequent parametric perfusion maps were calculated using RAPID software. CONTRAST:  ISOVUE-370 IOPAMIDOL (ISOVUE-370) INJECTION 76% COMPARISON:  07/19/2018 CT head. 07/09/2018 MRI head. FINDINGS: CT HEAD FINDINGS Brain: No evidence of acute infarction, hemorrhage, hydrocephalus, extra-axial collection or mass lesion/mass effect. 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 Stable chronic microvascular ischemic changes and volume loss of the brain. Vascular: As below. Skull: Normal. Negative for fracture or focal lesion. Sinuses/Orbits: Small fluid levels within the paranasal sinuses. Normal aeration of mastoid air cells. Orbits are unremarkable. Other: None. ASPECTS (Alberta Stroke Program Early CT Score) - Ganglionic level infarction (caudate, lentiform nuclei, internal capsule, insula, M1-M3 cortex): 7 - Supraganglionic infarction (M4-M6 cortex): 3 Total score (0-10 with 10 being normal): 10 Review of the MIP images confirms the above findings CTA NECK FINDINGS Aortic arch: Standard branching. Imaged portion shows no evidence of aneurysm or dissection. No significant stenosis of the major arch vessel origins. Moderate mixed plaque of the aortic arch. Right carotid system: No evidence of dissection, stenosis (50% or greater) or occlusion. Predominant fibrofatty plaque  of the carotid bifurcation with minimal less than 30% proximal ICA stenosis. Left carotid system: No evidence of dissection, stenosis (50% or greater) or occlusion. Predominant fibrofatty plaque of the carotid bifurcation with mild less than 50% proximal ICA stenosis. Mild nonstenotic beaded irregularity of the left mid cervical ICA. Vertebral arteries: Codominant. No evidence of dissection, stenosis (50% or greater) or occlusion. Segments of non stenotic beaded irregularity of the vertebral  arteries. Skeleton: Moderate severe spondylosis of the cervical spine. Multilevel uncovertebral and facet hypertrophy with bony foraminal encroachment. Spinal canal stenosis is greatest at the C6-7 level where it is mild-to-moderate. Grade 1 C7-T1 anterolisthesis. Other neck: Negative. Upper chest: Mild centrilobular emphysema of the lung apices. Review of the MIP images confirms the above findings CTA HEAD FINDINGS Anterior circulation: No significant stenosis, proximal occlusion, aneurysm, or vascular malformation. Non stenotic calcific atherosclerosis of carotid siphons. Posterior circulation: No significant stenosis, proximal occlusion, aneurysm, or vascular malformation. Mild right P2 stenosis. Venous sinuses: As permitted by contrast timing, patent. Anatomic variants: Complete circle-of-Willis. Right fetal PCA. Review of the MIP images confirms the above findings CT Brain Perfusion Findings: CBF (<30%) Volume: 0mL Perfusion (Tmax>6.0s) volume: 0mL Mismatch Volume: 0mL Infarction Location:Negative. IMPRESSION: CT head: 1. No acute intracranial abnormality identified.  ASPECTS is 10. 2. Stable chronic microvascular ischemic changes and volume loss of the brain. 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 CT perfusion head: Negative CT perfusion head. No acute stroke or ischemic penumbra by RAPID perfusion criteria. CTA neck: 1. Patent carotid and vertebral arteries. No dissection, aneurysm, or high-grade stenosis by NASCET criteria. 2. Predominant fibrofatty plaque of bilateral carotid bifurcations with mild less than 50% proximal ICA stenosis. 3. 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 4. Moderate severe spondylosis of the cervical spine greatest at C6-7. CTA head: Patent anterior and posterior intracranial circulation. No large vessel occlusion, aneurysm, or significant stenosis is identified. These results  were called by telephone at the time of interpretation on 09/06/2018 at 1:06 am to Dr. Bayard Males , who verbally acknowledged these results. Electronically Signed   By: Mitzi Hansen M.D.   On: 09/06/2018 01:09   Dg Chest Portable 1 View  Result Date: 09/13/2018 CLINICAL DATA:  Patient found unresponsive today. EXAM: PORTABLE CHEST 1 VIEW COMPARISON:  PA and lateral chest 09/04/2018. Single-view of the chest 10/29/2017. FINDINGS: The lungs are clear. Heart size is normal. Aortic atherosclerosis noted. No pneumothorax or pleural effusion. No acute bony abnormality. Remote right sixth and seventh rib fracture identified. Remote left clavicle fracture also noted. IMPRESSION: No acute disease. Atherosclerosis. Electronically Signed   By: Drusilla Kanner M.D.   On: 09/13/2018 14:26   Ct Head Code Stroke Wo Contrast  Result Date: 09/06/2018 CLINICAL DATA:  73 y/o F; seizure-like activity. Focal neural deficit. Code stroke. EXAM: CT HEAD WITHOUT CONTRAST CT ANGIOGRAPHY HEAD AND NECK CT PERFUSION BRAIN TECHNIQUE: Multidetector CT imaging of the head and neck was performed using the standard protocol during bolus administration of intravenous contrast. Multiplanar CT image reconstructions and MIPs were obtained to evaluate the vascular anatomy. Carotid stenosis measurements (when applicable) are obtained utilizing NASCET criteria, using the distal internal carotid diameter as the denominator. Multiphase CT imaging of the brain was performed following IV bolus contrast injection. Subsequent parametric perfusion maps were calculated using RAPID software. CONTRAST:  ISOVUE-370 IOPAMIDOL (ISOVUE-370) INJECTION 76% COMPARISON:  07/19/2018 CT head. 07/09/2018 MRI head. FINDINGS: CT HEAD FINDINGS  Brain: No evidence of acute infarction, hemorrhage, hydrocephalus, extra-axial collection or mass lesion/mass effect. Stable small chronic infarcts are present within the left frontal, left parietal, and left  occipital lobes. Stable chronic microvascular ischemic changes and volume loss of the brain. Vascular: As below. Skull: Normal. Negative for fracture or focal lesion. Sinuses/Orbits: Small fluid levels within the paranasal sinuses. Normal aeration of mastoid air cells. Orbits are unremarkable. Other: None. ASPECTS (Alberta Stroke Program Early CT Score) - Ganglionic level infarction (caudate, lentiform nuclei, internal capsule, insula, M1-M3 cortex): 7 - Supraganglionic infarction (M4-M6 cortex): 3 Total score (0-10 with 10 being normal): 10 Review of the MIP images confirms the above findings CTA NECK FINDINGS Aortic arch: Standard branching. Imaged portion shows no evidence of aneurysm or dissection. No significant stenosis of the major arch vessel origins. Moderate mixed plaque of the aortic arch. Right carotid system: No evidence of dissection, stenosis (50% or greater) or occlusion. Predominant fibrofatty plaque of the carotid bifurcation with minimal less than 30% proximal ICA stenosis. Left carotid system: No evidence of dissection, stenosis (50% or greater) or occlusion. Predominant fibrofatty plaque of the carotid bifurcation with mild less than 50% proximal ICA stenosis. Mild nonstenotic beaded irregularity of the left mid cervical ICA. Vertebral arteries: Codominant. No evidence of dissection, stenosis (50% or greater) or occlusion. Segments of non stenotic beaded irregularity of the vertebral arteries. Skeleton: Moderate severe spondylosis of the cervical spine. Multilevel uncovertebral and facet hypertrophy with bony foraminal encroachment. Spinal canal stenosis is greatest at the C6-7 level where it is mild-to-moderate. Grade 1 C7-T1 anterolisthesis. Other neck: Negative. Upper chest: Mild centrilobular emphysema of the lung apices. Review of the MIP images confirms the above findings CTA HEAD FINDINGS Anterior circulation: No significant stenosis, proximal occlusion, aneurysm, or vascular  malformation. Non stenotic calcific atherosclerosis of carotid siphons. Posterior circulation: No significant stenosis, proximal occlusion, aneurysm, or vascular malformation. Mild right P2 stenosis. Venous sinuses: As permitted by contrast timing, patent. Anatomic variants: Complete circle-of-Willis. Right fetal PCA. Review of the MIP images confirms the above findings CT Brain Perfusion Findings: CBF (<30%) Volume: 0mL Perfusion (Tmax>6.0s) volume: 0mL Mismatch Volume: 0mL Infarction Location:Negative. IMPRESSION: CT head: 1. No acute intracranial abnormality identified.  ASPECTS is 10. 2. Stable chronic microvascular ischemic changes and volume loss of the brain. 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 CT perfusion head: Negative CT perfusion head. No acute stroke or ischemic penumbra by RAPID perfusion criteria. CTA neck: 1. Patent carotid and vertebral arteries. No dissection, aneurysm, or high-grade stenosis by NASCET criteria. 2. Predominant fibrofatty plaque of bilateral carotid bifurcations with mild less than 50% proximal ICA stenosis. 3. 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 4. Moderate severe spondylosis of the cervical spine greatest at C6-7. CTA head: Patent anterior and posterior intracranial circulation. No large vessel occlusion, aneurysm, or significant stenosis is identified. These results were called by telephone at the time of interpretation on 09/06/2018 at 1:06 am to Dr. Bayard Males , who verbally acknowledged these results. Electronically Signed   By: Mitzi Hansen M.D.   On: 09/06/2018 01:09     CBC Recent Labs  Lab 09/09/18 0504 09/13/18 1435 09/14/18 0445  WBC 7.5 14.0* 11.7*  HGB 9.4* 13.5 11.0*  HCT 30.0* 43.2 35.1*  PLT 180 284 213  MCV 95.2 97.3 96.2  MCH 29.8 30.4 30.1  MCHC 31.3 31.3 31.3  RDW 16.1* 16.1* 15.8*  LYMPHSABS  --  1.6  --   MONOABS   --  1.0  --   EOSABS  --  0.2  --   BASOSABS  --  0.1  --     Chemistries  Recent Labs  Lab 09/13/18 1435 09/14/18 0445 09/15/18 0458  NA 140 141 140  K 5.2* 4.6 4.1  CL 105 109 109  CO2 25 25 25   GLUCOSE 109* 149* 140*  BUN 48* 43* 32*  CREATININE 1.87* 1.71* 1.39*  CALCIUM 9.4 8.4* 8.5*  AST 20 15  --   ALT 10 8  --   ALKPHOS 84 73  --   BILITOT 0.6 0.5  --    ------------------------------------------------------------------------------------------------------------------ estimated creatinine clearance is 31.1 mL/min (A) (by C-G formula based on SCr of 1.39 mg/dL (H)). ------------------------------------------------------------------------------------------------------------------ No results for input(s): HGBA1C in the last 72 hours. ------------------------------------------------------------------------------------------------------------------ No results for input(s): CHOL, HDL, LDLCALC, TRIG, CHOLHDL, LDLDIRECT in the last 72 hours. ------------------------------------------------------------------------------------------------------------------ No results for input(s): TSH, T4TOTAL, T3FREE, THYROIDAB in the last 72 hours.  Invalid input(s): FREET3 ------------------------------------------------------------------------------------------------------------------ Recent Labs    09/14/18 1312  VITAMINB12 480    Coagulation profile No results for input(s): INR, PROTIME in the last 168 hours.  No results for input(s): DDIMER in the last 72 hours.  Cardiac Enzymes No results for input(s): CKMB, TROPONINI, MYOGLOBIN in the last 168 hours.  Invalid input(s): CK ------------------------------------------------------------------------------------------------------------------ Invalid input(s): POCBNP    Assessment & Plan  Pt is 70 with recent hospitalization for sz and uti  *Acute encephalopathy could be related to seizure Patient's has not shown much  improvement I have checked a ABG which shows no CO2 retention Ammonia levels normal B12 levels normal Her urine culture is not growing any bacteria She is afebrile, WBC was minimally elevated there does not seem to be any evidence of meningitis Due to no improvement I will order MRI of the brain, patient has had a normal MRI recently   *Seizure: with recurrent sz  Continue Keppra, continue IV Vimpat  appreciate neurology input    *UTI-I will continue ceftriaxone urine cultures are negative though  * Hypertension: Blood pressure meds on hold due to patient unable to take any oral meds  * Atrial fibrillation: Rate controlled; Eliquis on hold due to patient unable to take oral meds   * History of stroke: The patient has a history of falls and likely has gait disturbance secondary to her remote strokes.   * DVT prophylaxis: As above  * GI prophylaxis: None     Code Status Orders  (From admission, onward)         Start     Ordered   09/13/18 1822  Full code  Continuous     09/13/18 1822        Code Status History    Date Active Date Inactive Code Status Order ID Comments User Context   09/08/2018 2254 09/09/2018 2026 DNR [id number]  Altamese Dilling, MD Inpatient   09/06/2018 0728 09/08/2018 2254 Full Code [id number]  Arnaldo Natal, MD ED   08/28/2018 1307 08/31/2018 2356 Full Code [id number]  Ihor Austin, MD Inpatient   07/06/2018 1628 07/09/2018 2237 Full Code [id number]  Altamese Dilling, MD ED   06/24/2018 1648 07/03/2018 1942 Full Code [id number]  Adrian Saran, MD Inpatient   05/20/2018 1847 05/21/2018 2212 Full Code [id number]  Ramonita Lab, MD Inpatient   05/20/2018 1847 05/20/2018 1847 Full Code [id number]  Ramonita Lab, MD Inpatient   12/31/2017 1538 01/04/2018 2130 DNR [id number]  Milagros Loll, MD ED   12/13/2017 1630 12/15/2017 2016 Full Code [id number]  Alford Highland, MD ED   11/22/2017 0202 11/23/2017 2009 DNR [id number]  Salary, Evelena Asa, MD ED    11/22/2017 0150 11/22/2017 0202 Full Code [id number]  Salary, Evelena Asa, MD ED   11/01/2017 1026 11/02/2017 0155 DNR [id number]  Delfino Lovett, MD Inpatient   10/29/2017 1256 11/01/2017 1026 Full Code [id number]  Alford Highland, MD ED           Consults neurology   DVT Prophylaxis  Lovenox   Lab Results  Component Value Date   PLT 213 09/14/2018     Time Spent in minutes   35 minutes  Greater than 50% of time spent in care coordination and counseling patient regarding the condition and plan of care.   Auburn Bilberry M.D on 09/15/2018 at 10:55 AM  Between 7am to 6pm - Pager - [phone]  After 6pm go to [email or web address] - Social research officer, government  [employer]   Office  [phone]

## 2018-09-15 NOTE — Progress Notes (Signed)
Pt. Daughter yelled out from room. My self, assigned RN, ond Meghan O, Charge, RN responded to find pt in state of acute status change. Pt eyes fixed looking upwards with slowed pupil response. Tremor in right hand. Upper extremities limber. Called Rapid response, Paged S. Patel,attending MD. A. Gouru responded. See medication and imaging orders. IV consult ordered for pheripheral access. Lisa responded, suggested need for PICC line. Reached out to MD for order.PICC line ordered placed. Pt VS stable now resting comfortably . Consent obtained for PICC line signed via pt. Daughter, Apolonio SchneidersCatherine Cox. Consent placed with Pt chart

## 2018-09-15 NOTE — Progress Notes (Signed)
Patient noted to be minimally responsive, does respond to voice and falls right back to sleep. Maintained on semi to high fowlers position, aspiration, fall and seizure precautions are in place. Received anti-seizure medicines as scheduled. Suction set-up, suction with Yankauer rendered PRN. Patient has a chronic foley, noted for some hematuria going on, clearing out to pinkish colored urine. MD informed about the presence of blood in the urine. Will continue to monitor.

## 2018-09-15 NOTE — Progress Notes (Signed)
   09/15/18 1500  Clinical Encounter Type  Visited With Family;Health care provider  Visit Type Code   Chaplain responded to a RRT page to the patient's room. Upon arrival, medical staff were assessing patient's status and comforting her daughter. Daughter introduced to chaplain by [facility] and we engaged in brief conversation. Chaplain provided emotional support and silent prayer for the patient during assessment.

## 2018-09-15 NOTE — Consult Note (Signed)
Pharmacy Antibiotic Note  Destiny Mack is a 73 y.o. female admitted on 09/13/2018 with aspiration pneumonia.  Pharmacy has been consulted for Unasyn dosing.  Plan: Unasyn 3 gm IV every 6 hours.  Will monitor renal function for possible dose adjustment as patient is currently borderline.     Temp (24hrs), Avg:98.4 F (36.9 C), Min:98.3 F (36.8 C), Max:98.6 F (37 C)  Recent Labs  Lab 09/09/18 0504 09/13/18 1435 09/14/18 0445 09/15/18 0458  WBC 7.5 14.0* 11.7*  --   CREATININE  --  1.87* 1.71* 1.39*    Estimated Creatinine Clearance: 31.1 mL/min (A) (by C-G formula based on SCr of 1.39 mg/dL (H)).    No Known Allergies  Antimicrobials this admission: CTX 1/18 >> 1/19 Unasyn 1/19 >>   Dose adjustments this admission:   Microbiology results: 1/17 UCx: no growth    Thank you for allowing pharmacy to be a part of this patient's care.  Orinda Kenner , PharmD Clinical Pharmacist 09/15/2018 3:38 PM

## 2018-09-15 NOTE — Progress Notes (Signed)
Advanced care plan.  Purpose of the Encounter: CODE STATUS  Parties in Attendance: Patient's daughter Destiny Mack  Patient's Decision Capacity: Not intact  Subjective/Patient's story:  Patient 73 year old being admitted with acute encephalopathy, history of dementia history of seizures patient has had multiple admissions within the past 6 months.  After being hospitalized for 2 days she shows no improvement.  Continues to be drowsy.   Objective/Medical story  I discussed with the patient's daughter regarding overall poor prognosis and failure to improve strongly recommended patient be a DNR.  Goals of care determination:  Patient still states that this is a hard decision for her to make and would like her to be full code for now   CODE STATUS: Continue full CODE STATUS   Time spent discussing advanced care planning: 16 minutes

## 2018-09-15 NOTE — Progress Notes (Signed)
Spoke with Counsellor.  States she has called CVW to place PICC.  Pt still has 2 PIV's per RN.  Night shift VAS Team RN to attempt another PIV per RN request.

## 2018-09-16 ENCOUNTER — Inpatient Hospital Stay: Payer: Medicare HMO

## 2018-09-16 DIAGNOSIS — R4182 Altered mental status, unspecified: Secondary | ICD-10-CM

## 2018-09-16 DIAGNOSIS — R569 Unspecified convulsions: Secondary | ICD-10-CM

## 2018-09-16 LAB — BASIC METABOLIC PANEL
Anion gap: 3 — ABNORMAL LOW (ref 5–15)
BUN: 27 mg/dL — ABNORMAL HIGH (ref 8–23)
CALCIUM: 8.3 mg/dL — AB (ref 8.9–10.3)
CO2: 26 mmol/L (ref 22–32)
Chloride: 112 mmol/L — ABNORMAL HIGH (ref 98–111)
Creatinine, Ser: 1.32 mg/dL — ABNORMAL HIGH (ref 0.44–1.00)
GFR calc Af Amer: 46 mL/min — ABNORMAL LOW (ref >60)
GFR calc non Af Amer: 40 mL/min — ABNORMAL LOW (ref >60)
Glucose, Bld: 125 mg/dL — ABNORMAL HIGH (ref 70–99)
Potassium: 4.1 mmol/L (ref 3.5–5.1)
SODIUM: 141 mmol/L (ref 135–145)

## 2018-09-16 LAB — CBC
HCT: 28.9 % — ABNORMAL LOW (ref 36.0–46.0)
Hemoglobin: 9.1 g/dL — ABNORMAL LOW (ref 12.0–15.0)
MCH: 30.4 pg (ref 26.0–34.0)
MCHC: 31.5 g/dL (ref 30.0–36.0)
MCV: 96.7 fL (ref 80.0–100.0)
Platelets: 144 10*3/uL — ABNORMAL LOW (ref 150–400)
RBC: 2.99 MIL/uL — ABNORMAL LOW (ref 3.87–5.11)
RDW: 15.2 % (ref 11.5–15.5)
WBC: 6.8 10*3/uL (ref 4.0–10.5)
nRBC: 0 % (ref 0.0–0.2)

## 2018-09-16 LAB — VITAMIN D 25 HYDROXY (VIT D DEFICIENCY, FRACTURES): Vit D, 25-Hydroxy: 11.8 ng/mL — ABNORMAL LOW (ref 30.0–100.0)

## 2018-09-16 MED ORDER — SODIUM CHLORIDE 0.9% FLUSH
10.0000 mL | Freq: Two times a day (BID) | INTRAVENOUS | Status: DC
  Administered 2018-09-16: 10 mL
  Administered 2018-09-16: 10:00:00 30 mL
  Administered 2018-09-17 – 2018-09-19 (×4): 10 mL

## 2018-09-16 MED ORDER — SODIUM CHLORIDE 0.9 % IV SOLN
750.0000 mg | Freq: Two times a day (BID) | INTRAVENOUS | Status: DC
  Administered 2018-09-16 – 2018-09-19 (×6): 750 mg via INTRAVENOUS
  Filled 2018-09-16 (×9): qty 7.5

## 2018-09-16 MED ORDER — SODIUM CHLORIDE 0.9% FLUSH
10.0000 mL | INTRAVENOUS | Status: DC | PRN
  Administered 2018-09-18: 12:00:00 10 mL
  Filled 2018-09-16: qty 40

## 2018-09-16 NOTE — Progress Notes (Signed)
eeg completed ° °

## 2018-09-16 NOTE — Progress Notes (Signed)
Please note, patient has a pending outpatient Palliative referral to be seen at home. CMRN Terrilee Croak made aware. Dayna Barker RN, BSN, [facility] [place], hospital Liaison [phone]

## 2018-09-16 NOTE — Progress Notes (Signed)
Subjective: Sz overnight, MRI was normal  Exam: Vitals:   09/16/18 0536 09/16/18 0800  BP:  (!) 122/54  Pulse:    Resp:    Temp: 98.3 F (36.8 C) 98.8 F (37.1 C)  SpO2:  99%   Gen: In bed, NAD Resp: non-labored breathing, no acute distress Abd: soft, nt  Neuro: MS: Opens eyes to name, she does tell me her name, and tells me repeatedly to quit when I am stimulating her.  She does not clearly follow commands. CN: Pupils equal reactive, she does cross midline looking in both directions,  Motor: she has increased tone in her left arm, though she does withdraw all 4 extremities to noxious stimulation Sensory: She responds to noxious stimulation in all 4 extremities  Pertinent Labs: Creatinine 1.3, GFR 40 Ammonia 1/18 16 B12 1/18 480 Creatinine increased on 1/17-1.87, this is improving Keppra level from 1/17- 30.4  MRI brain reviewed-extensive subcortical white matter disease, there is also a old left occipital cortically based infarct.  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  She has been admitted with encephalopathy which was presumed secondary to urinary tract infection, though culture does not show any growth.  I do think there is now some room to increase her Keppra   Recommendations: 1) increase Keppra to Keppra 750 mg twice daily(renal dysfunction is playing a role in dosing)(started in early January) 2) Vimpat 200 mg twice daily(started with this hospitalization) 3) repeat EEG today  Ritta Slot, MD [facility] [phone]  If 7pm- 7am, please page neurology on call as listed in AMION.

## 2018-09-16 NOTE — Progress Notes (Signed)
[employer] [facility] [place] [facility]                                                                                                                                                                                  Patient Demographics   Destiny Mack, is a 73 y.o. female, DOB - 27-Sep-1945, ZOX:[id number]  Admit date - 09/13/2018   Admitting Physician Ramonita LabAruna Gouru, MD  Outpatient Primary MD for the patient is Bender, Earl LagosAbby Daneele, MD   LOS - 3  Subjective: Patient had seizure again yesterday, this morning drowsy  Review of Systems:   CONSTITUTIONAL: Drowsy Vitals:   Vitals:   09/16/18 0357 09/16/18 0410 09/16/18 0536 09/16/18 0800  BP: 129/62   (!) 122/54  Pulse: 62     Resp:      Temp: (!) 96.1 F (35.6 C) (!) 97.1 F (36.2 C) 98.3 F (36.8 C) 98.8 F (37.1 C)  TempSrc: Axillary Rectal Axillary Axillary  SpO2: 100%   99%    Wt Readings from Last 3 Encounters:  09/08/18 61.3 kg  09/04/18 58.1 kg  08/28/18 58.1 kg     Intake/Output Summary (Last 24 hours) at 09/16/2018 1021 Last data filed at 09/16/2018 [id number] Gross per 24 hour  Intake 1736.09 ml  Output 775 ml  Net 961.09 ml    Physical Exam:   GENERAL: Ill-appearing HEAD, EYES, EARS, NOSE AND THROAT: Atraumatic, normocephalic. Extraocular muscles are intact. Pupils equal and reactive to light. Sclerae anicteric. No conjunctival injection. No oro-pharyngeal erythema.  NECK: Supple. There is no jugular venous distention. No bruits, no lymphadenopathy, no thyromegaly.  HEART: Regular rate and rhythm,. No murmurs, no rubs, no clicks.  LUNGS: Rhonchus breath sound ABDOMEN: Soft, flat, nontender, nondistended. Has good bowel sounds. No hepatosplenomegaly appreciated.  EXTREMITIES: No evidence of any cyanosis, clubbing, or peripheral edema.  +2 pedal and radial pulses bilaterally.  NEUROLOGIC: Patient drowsy sKIN: Moist and warm with no rashes appreciated.  Psych: Not anxious, depressed LN: No inguinal LN  enlargement    Antibiotics   Anti-infectives (From admission, onward)   Start     Dose/Rate Route Frequency Ordered Stop   09/15/18 1600  Ampicillin-Sulbactam (UNASYN) 3 g in sodium chloride 0.9 % 100 mL IVPB     3 g 200 mL/hr over 30 Minutes Intravenous Every 6 hours 09/15/18 1537     09/14/18 1800  cefTRIAXone (ROCEPHIN) 1 g in sodium chloride 0.9 % 100 mL IVPB  Status:  Discontinued     1 g 200 mL/hr over 30 Minutes Intravenous Every 24 hours 09/13/18 1812 09/15/18 1536   09/13/18 1630  cefTRIAXone (ROCEPHIN) 1 g in sodium chloride 0.9 % 100  mL IVPB     1 g 200 mL/hr over 30 Minutes Intravenous  Once 09/13/18 1618 09/13/18 1827      Medications   Scheduled Meds: . chlorhexidine  15 mL Mouth Rinse BID  . mouth rinse  15 mL Mouth Rinse q12n4p  . modafinil  100 mg Oral Daily  . sodium chloride flush  10-40 mL Intracatheter Q12H   Continuous Infusions: . sodium chloride Stopped (09/14/18 1356)  . ampicillin-sulbactam (UNASYN) IV Stopped (09/16/18 0450)  . dextrose 5 % and 0.45% NaCl 100 mL/hr at 09/16/18 0922  . lacosamide (VIMPAT) IV Stopped (09/15/18 2351)  . levETIRAcetam 500 mg (09/16/18 0925)   PRN Meds:.sodium chloride, acetaminophen **OR** acetaminophen, LORazepam, ondansetron **OR** ondansetron (ZOFRAN) IV, sodium chloride flush   Data Review:   Micro Results Recent Results (from the past 240 hour(s))  Urine Culture     Status: None   Collection Time: 09/13/18  2:35 PM  Result Value Ref Range Status   Specimen Description   Final    URINE, RANDOM Performed at [facility], [address] [place], [place] [address]    Special Requests   Final    NONE Performed at [facility], [address] [place], [place] [address]    Culture   Final    NO GROWTH Performed at [facility], [address], [place], [place] [address]    Report Status 09/14/2018 FINAL  Final    Radiology Reports Ct Angio Head W Or Wo Contrast  Result  Date: 09/06/2018 CLINICAL DATA:  73 y/o F; seizure-like activity. Focal neural deficit. Code stroke. EXAM: CT HEAD WITHOUT CONTRAST CT ANGIOGRAPHY HEAD AND NECK CT PERFUSION BRAIN TECHNIQUE: Multidetector CT imaging of the head and neck was performed using the standard protocol during bolus administration of intravenous contrast. Multiplanar CT image reconstructions and MIPs were obtained to evaluate the vascular anatomy. Carotid stenosis measurements (when applicable) are obtained utilizing NASCET criteria, using the distal internal carotid diameter as the denominator. Multiphase CT imaging of the brain was performed following IV bolus contrast injection. Subsequent parametric perfusion maps were calculated using RAPID software. CONTRAST:  ISOVUE-370 IOPAMIDOL (ISOVUE-370) INJECTION 76% COMPARISON:  07/19/2018 CT head. 07/09/2018 MRI head. FINDINGS: CT HEAD FINDINGS Brain: No evidence of acute infarction, hemorrhage, hydrocephalus, extra-axial collection or mass lesion/mass effect. 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 Stable chronic microvascular ischemic changes and volume loss of the brain. Vascular: As below. Skull: Normal. Negative for fracture or focal lesion. Sinuses/Orbits: Small fluid levels within the paranasal sinuses. Normal aeration of mastoid air cells. Orbits are unremarkable. Other: None. ASPECTS (Alberta Stroke Program Early CT Score) - Ganglionic level infarction (caudate, lentiform nuclei, internal capsule, insula, M1-M3 cortex): 7 - Supraganglionic infarction (M4-M6 cortex): 3 Total score (0-10 with 10 being normal): 10 Review of the MIP images confirms the above findings CTA NECK FINDINGS Aortic arch: Standard branching. Imaged portion shows no evidence of aneurysm or dissection. No significant stenosis of the major arch vessel origins. Moderate mixed plaque of the aortic arch. Right carotid system: No evidence of  dissection, stenosis (50% or greater) or occlusion. Predominant fibrofatty plaque of the carotid bifurcation with minimal less than 30% proximal ICA stenosis. Left carotid system: No evidence of dissection, stenosis (50% or greater) or occlusion. Predominant fibrofatty plaque of the carotid bifurcation with mild less than 50% proximal ICA stenosis. Mild nonstenotic beaded irregularity of the left mid cervical ICA.  Vertebral arteries: Codominant. No evidence of dissection, stenosis (50% or greater) or occlusion. Segments of non stenotic beaded irregularity of the vertebral arteries. Skeleton: Moderate severe spondylosis of the cervical spine. Multilevel uncovertebral and facet hypertrophy with bony foraminal encroachment. Spinal canal stenosis is greatest at the C6-7 level where it is mild-to-moderate. Grade 1 C7-T1 anterolisthesis. Other neck: Negative. Upper chest: Mild centrilobular emphysema of the lung apices. Review of the MIP images confirms the above findings CTA HEAD FINDINGS Anterior circulation: No significant stenosis, proximal occlusion, aneurysm, or vascular malformation. Non stenotic calcific atherosclerosis of carotid siphons. Posterior circulation: No significant stenosis, proximal occlusion, aneurysm, or vascular malformation. Mild right P2 stenosis. Venous sinuses: As permitted by contrast timing, patent. Anatomic variants: Complete circle-of-Willis. Right fetal PCA. Review of the MIP images confirms the above findings CT Brain Perfusion Findings: CBF (<30%) Volume: 0mL Perfusion (Tmax>6.0s) volume: 0mL Mismatch Volume: 0mL Infarction Location:Negative. IMPRESSION: CT head: 1. No acute intracranial abnormality identified.  ASPECTS is 10. 2. Stable chronic microvascular ischemic changes and volume loss of the brain. 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 CT perfusion head: Negative CT perfusion head. No acute stroke or ischemic penumbra by RAPID  perfusion criteria. CTA neck: 1. Patent carotid and vertebral arteries. No dissection, aneurysm, or high-grade stenosis by NASCET criteria. 2. Predominant fibrofatty plaque of bilateral carotid bifurcations with mild less than 50% proximal ICA stenosis. 3. 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 4. Moderate severe spondylosis of the cervical spine greatest at C6-7. CTA head: Patent anterior and posterior intracranial circulation. No large vessel occlusion, aneurysm, or significant stenosis is identified. These results were called by telephone at the time of interpretation on 09/06/2018 at 1:06 am to Dr. Bayard Males , who verbally acknowledged these results. Electronically Signed   By: Mitzi Hansen M.D.   On: 09/06/2018 01:09   Dg Chest 2 View  Result Date: 09/04/2018 CLINICAL DATA:  Altered mental status EXAM: CHEST - 2 VIEW COMPARISON:  07/19/2018 FINDINGS: The heart size and mediastinal contours are within normal limits. Both lungs are clear. Old left clavicle fracture. IMPRESSION: No active cardiopulmonary disease. Electronically Signed   By: Jasmine Pang M.D.   On: 09/04/2018 16:26   Ct Head Wo Contrast  Result Date: 09/13/2018 CLINICAL DATA:  Altered mental status EXAM: CT HEAD WITHOUT CONTRAST TECHNIQUE: Contiguous axial images were obtained from the base of the skull through the vertex without intravenous contrast. COMPARISON:  09/06/2018 FINDINGS: Brain: No evidence of acute infarction, hemorrhage, extra-axial collection, ventriculomegaly, or mass effect. Generalized cerebral atrophy. Periventricular white matter low attenuation likely secondary to microangiopathy. Vascular: Cerebrovascular atherosclerotic calcifications are noted. Skull: Negative for fracture or focal lesion. Sinuses/Orbits: Visualized portions of the orbits are unremarkable. Mastoid sinuses are clear. Air-fluid level in the left sphenoid sinus. Other: None.  IMPRESSION: 1. No acute intracranial pathology. 2. Chronic microvascular disease and cerebral atrophy. Electronically Signed   By: Elige Ko   On: 09/13/2018 17:26   Ct Angio Neck W Or Wo Contrast  Result Date: 09/06/2018 CLINICAL DATA:  73 y/o F; seizure-like activity. Focal neural deficit. Code stroke. EXAM: CT HEAD WITHOUT CONTRAST CT ANGIOGRAPHY HEAD AND NECK CT PERFUSION BRAIN TECHNIQUE: Multidetector CT imaging of the head and neck was performed using the standard protocol during bolus administration of intravenous contrast. Multiplanar CT image reconstructions and MIPs were obtained to evaluate the vascular anatomy. Carotid stenosis measurements (when applicable) are obtained utilizing NASCET  criteria, using the distal internal carotid diameter as the denominator. Multiphase CT imaging of the brain was performed following IV bolus contrast injection. Subsequent parametric perfusion maps were calculated using RAPID software. CONTRAST:  ISOVUE-370 IOPAMIDOL (ISOVUE-370) INJECTION 76% COMPARISON:  07/19/2018 CT head. 07/09/2018 MRI head. FINDINGS: CT HEAD FINDINGS Brain: No evidence of acute infarction, hemorrhage, hydrocephalus, extra-axial collection or mass lesion/mass effect. 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 Stable chronic microvascular ischemic changes and volume loss of the brain. Vascular: As below. Skull: Normal. Negative for fracture or focal lesion. Sinuses/Orbits: Small fluid levels within the paranasal sinuses. Normal aeration of mastoid air cells. Orbits are unremarkable. Other: None. ASPECTS (Alberta Stroke Program Early CT Score) - Ganglionic level infarction (caudate, lentiform nuclei, internal capsule, insula, M1-M3 cortex): 7 - Supraganglionic infarction (M4-M6 cortex): 3 Total score (0-10 with 10 being normal): 10 Review of the MIP images confirms the above findings CTA NECK FINDINGS Aortic arch: Standard  branching. Imaged portion shows no evidence of aneurysm or dissection. No significant stenosis of the major arch vessel origins. Moderate mixed plaque of the aortic arch. Right carotid system: No evidence of dissection, stenosis (50% or greater) or occlusion. Predominant fibrofatty plaque of the carotid bifurcation with minimal less than 30% proximal ICA stenosis. Left carotid system: No evidence of dissection, stenosis (50% or greater) or occlusion. Predominant fibrofatty plaque of the carotid bifurcation with mild less than 50% proximal ICA stenosis. Mild nonstenotic beaded irregularity of the left mid cervical ICA. Vertebral arteries: Codominant. No evidence of dissection, stenosis (50% or greater) or occlusion. Segments of non stenotic beaded irregularity of the vertebral arteries. Skeleton: Moderate severe spondylosis of the cervical spine. Multilevel uncovertebral and facet hypertrophy with bony foraminal encroachment. Spinal canal stenosis is greatest at the C6-7 level where it is mild-to-moderate. Grade 1 C7-T1 anterolisthesis. Other neck: Negative. Upper chest: Mild centrilobular emphysema of the lung apices. Review of the MIP images confirms the above findings CTA HEAD FINDINGS Anterior circulation: No significant stenosis, proximal occlusion, aneurysm, or vascular malformation. Non stenotic calcific atherosclerosis of carotid siphons. Posterior circulation: No significant stenosis, proximal occlusion, aneurysm, or vascular malformation. Mild right P2 stenosis. Venous sinuses: As permitted by contrast timing, patent. Anatomic variants: Complete circle-of-Willis. Right fetal PCA. Review of the MIP images confirms the above findings CT Brain Perfusion Findings: CBF (<30%) Volume: 46mL Perfusion (Tmax>6.0s) volume: 40mL Mismatch Volume: 52mL Infarction Location:Negative. IMPRESSION: CT head: 1. No acute intracranial abnormality identified.  ASPECTS is 10. 2. Stable chronic microvascular ischemic changes and  volume loss of the brain. 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 CT perfusion head: Negative CT perfusion head. No acute stroke or ischemic penumbra by RAPID perfusion criteria. CTA neck: 1. Patent carotid and vertebral arteries. No dissection, aneurysm, or high-grade stenosis by NASCET criteria. 2. Predominant fibrofatty plaque of bilateral carotid bifurcations with mild less than 50% proximal ICA stenosis. 3. 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 4. Moderate severe spondylosis of the cervical spine greatest at C6-7. CTA head: Patent anterior and posterior intracranial circulation. No large vessel occlusion, aneurysm, or significant stenosis is identified. These results were called by telephone at the time of interpretation on 09/06/2018 at 1:06 am to Dr. Bayard Males , who verbally acknowledged these results. Electronically Signed   By: Mitzi Hansen M.D.   On: 09/06/2018 01:09   Mr Brain Wo Contrast  Result  Date: 09/15/2018 CLINICAL DATA:  73 year old female with altered mental status, found down. Seizure. EXAM: MRI HEAD WITHOUT CONTRAST TECHNIQUE: Multiplanar, multiecho pulse sequences of the brain and surrounding structures were obtained without intravenous contrast. COMPARISON:  Head CT 09/13/2018.  Brain MRI 09/06/2018 and earlier. FINDINGS: Brain: No restricted diffusion or evidence of acute infarction. There are stable small areas of cortical encephalomalacia in the left anterior frontal gyrus, left occipital pole, and right superior temporal gyrus. Confluent bilateral cerebral white matter T2 and FLAIR hyperintensity. No definite chronic cerebral blood products. One or 2 small chronic lacunar infarcts in the right thalamus. Mild T2 heterogeneity in the pons. Negative cerebellum. No midline shift, mass effect, evidence of mass lesion, ventriculomegaly, extra-axial collection or acute  intracranial hemorrhage. Cervicomedullary junction and pituitary are within normal limits. Vascular: Major intracranial vascular flow voids are stable. Skull and upper cervical spine: Widespread advanced cervical spine degeneration with up to moderate appearing degenerative spinal stenosis at C3-C4 with spinal cord mass effect (series 6, image 13), and a lesser degree of stenosis at most other visible levels. Visualized bone marrow signal is within normal limits. Sinuses/Orbits: Negative orbits. Fluid level in the left sphenoid sinus has increased from earlier this month. Mild right sphenoid opacification. Other: Mastoids remain clear. Grossly normal visible internal auditory structures. Scalp and face soft tissues appear negative. IMPRESSION: 1. Continued stable MRI appearance of the brain. No acute intracranial abnormality. 2. 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 Electronically Signed   By: Odessa Fleming M.D.   On: 09/15/2018 13:11   Mr Brain Wo Contrast  Result Date: 09/06/2018 CLINICAL DATA:  Initial evaluation for acute syncope, history of atrial fibrillation. Evaluate for stroke. EXAM: MRI HEAD WITHOUT CONTRAST TECHNIQUE: Multiplanar, multiecho pulse sequences of the brain and surrounding structures were obtained without intravenous contrast. COMPARISON:  Comparison made with prior CTs from earlier the same day. FINDINGS: Brain: Diffuse prominence of the CSF containing spaces compatible with generalized age-related cerebral atrophy. Patchy and confluent T2/FLAIR hyperintensity within the periventricular deep white matter both cerebral hemispheres consistent with chronic microvascular ischemic disease, moderate to advanced in nature. Small chronic left occipital lobe infarct noted. Additional small chronic cortical infarct noted at the anterior left frontal lobe. Few tiny remote left cerebellar infarcts. No abnormal foci of restricted diffusion to suggest acute or subacute  ischemia. Gray-white matter differentiation maintained. No evidence for acute or chronic intracranial hemorrhage. No mass lesion, midline shift or mass effect. Ventricular prominence related to global parenchymal volume loss without hydrocephalus. No extra-axial fluid collection. Pituitary gland within normal limits. Vascular: Major intracranial vascular flow voids maintained. Skull and upper cervical spine: Craniocervical junction within normal limits. Multilevel degenerative spondylolysis noted within the upper cervical spine with resultant mild to moderate diffuse spinal stenosis. Bone marrow signal intensity within normal limits. No scalp soft tissue abnormality. Sinuses/Orbits: Globes and orbital soft tissues within normal limits. Mild scattered mucosal thickening throughout the paranasal sinuses. No air-fluid level to suggest acute sinusitis. Trace left mastoid effusion noted, of doubtful significance. Other: None. IMPRESSION: 1. No acute intracranial abnormality. 2. 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 Electronically Signed   By: Rise Mu M.D.   On: 09/06/2018 22:12   Ct Cerebral Perfusion W Contrast  Result Date: 09/06/2018 CLINICAL DATA:  73 y/o F; seizure-like activity. Focal neural deficit. Code stroke. EXAM: CT HEAD WITHOUT CONTRAST CT ANGIOGRAPHY HEAD AND NECK CT PERFUSION BRAIN TECHNIQUE: Multidetector  CT imaging of the head and neck was performed using the standard protocol during bolus administration of intravenous contrast. Multiplanar CT image reconstructions and MIPs were obtained to evaluate the vascular anatomy. Carotid stenosis measurements (when applicable) are obtained utilizing NASCET criteria, using the distal internal carotid diameter as the denominator. Multiphase CT imaging of the brain was performed following IV bolus contrast injection. Subsequent  parametric perfusion maps were calculated using RAPID software. CONTRAST:  ISOVUE-370 IOPAMIDOL (ISOVUE-370) INJECTION 76% COMPARISON:  07/19/2018 CT head. 07/09/2018 MRI head. FINDINGS: CT HEAD FINDINGS Brain: No evidence of acute infarction, hemorrhage, hydrocephalus, extra-axial collection or mass lesion/mass effect. 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 Stable chronic microvascular ischemic changes and volume loss of the brain. Vascular: As below. Skull: Normal. Negative for fracture or focal lesion. Sinuses/Orbits: Small fluid levels within the paranasal sinuses. Normal aeration of mastoid air cells. Orbits are unremarkable. Other: None. ASPECTS (Alberta Stroke Program Early CT Score) - Ganglionic level infarction (caudate, lentiform nuclei, internal capsule, insula, M1-M3 cortex): 7 - Supraganglionic infarction (M4-M6 cortex): 3 Total score (0-10 with 10 being normal): 10 Review of the MIP images confirms the above findings CTA NECK FINDINGS Aortic arch: Standard branching. Imaged portion shows no evidence of aneurysm or dissection. No significant stenosis of the major arch vessel origins. Moderate mixed plaque of the aortic arch. Right carotid system: No evidence of dissection, stenosis (50% or greater) or occlusion. Predominant fibrofatty plaque of the carotid bifurcation with minimal less than 30% proximal ICA stenosis. Left carotid system: No evidence of dissection, stenosis (50% or greater) or occlusion. Predominant fibrofatty plaque of the carotid bifurcation with mild less than 50% proximal ICA stenosis. Mild nonstenotic beaded irregularity of the left mid cervical ICA. Vertebral arteries: Codominant. No evidence of dissection, stenosis (50% or greater) or occlusion. Segments of non stenotic beaded irregularity of the vertebral arteries. Skeleton: Moderate severe spondylosis of the cervical spine. Multilevel uncovertebral and  facet hypertrophy with bony foraminal encroachment. Spinal canal stenosis is greatest at the C6-7 level where it is mild-to-moderate. Grade 1 C7-T1 anterolisthesis. Other neck: Negative. Upper chest: Mild centrilobular emphysema of the lung apices. Review of the MIP images confirms the above findings CTA HEAD FINDINGS Anterior circulation: No significant stenosis, proximal occlusion, aneurysm, or vascular malformation. Non stenotic calcific atherosclerosis of carotid siphons. Posterior circulation: No significant stenosis, proximal occlusion, aneurysm, or vascular malformation. Mild right P2 stenosis. Venous sinuses: As permitted by contrast timing, patent. Anatomic variants: Complete circle-of-Willis. Right fetal PCA. Review of the MIP images confirms the above findings CT Brain Perfusion Findings: CBF (<30%) Volume: 0mL Perfusion (Tmax>6.0s) volume: 0mL Mismatch Volume: 0mL Infarction Location:Negative. IMPRESSION: CT head: 1. No acute intracranial abnormality identified.  ASPECTS is 10. 2. Stable chronic microvascular ischemic changes and volume loss of the brain. 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 CT perfusion head: Negative CT perfusion head. No acute stroke or ischemic penumbra by RAPID perfusion criteria. CTA neck: 1. Patent carotid and vertebral arteries. No dissection, aneurysm, or high-grade stenosis by NASCET criteria. 2. Predominant fibrofatty plaque of bilateral carotid bifurcations with mild less than 50% proximal ICA stenosis. 3. 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 4. Moderate severe spondylosis of the cervical spine greatest at C6-7. CTA head: Patent anterior and posterior intracranial circulation. No large vessel occlusion, aneurysm, or significant stenosis is identified. These results were called by telephone at  the time of interpretation on 09/06/2018 at 1:06 am to Dr. Bayard Males ,  who verbally acknowledged these results. Electronically Signed   By: Mitzi Hansen M.D.   On: 09/06/2018 01:09   Dg Chest Port 1 View  Result Date: 09/15/2018 CLINICAL DATA:  Possible aspiration. EXAM: PORTABLE CHEST 1 VIEW COMPARISON:  September 15, 2018 FINDINGS: Again noted is calcified nodularity in the lateral left chest. The heart, hila, mediastinum, lungs, and pleura are otherwise unchanged. Possible mild atelectasis in the left base. IMPRESSION: No active disease. Electronically Signed   By: Gerome Sam III M.D   On: 09/15/2018 15:35   Dg Chest Port 1 View  Result Date: 09/15/2018 CLINICAL DATA:  73 year old female with altered mental status. Found down. Seizure. Cough. EXAM: PORTABLE CHEST 1 VIEW COMPARISON:  09/13/2018 and earlier. FINDINGS: Portable AP semi upright view at 1129 hours. Stable lung volumes and mediastinal contours. Mildly tortuous descending aorta. Allowing for portable technique the lungs are clear. Visualized tracheal air column is within normal limits. Negative visible bowel gas pattern. Subacute to chronic right lateral rib and left clavicle fractures. IMPRESSION: No acute cardiopulmonary abnormality. Electronically Signed   By: Odessa Fleming M.D.   On: 09/15/2018 13:12   Dg Chest Portable 1 View  Result Date: 09/13/2018 CLINICAL DATA:  Patient found unresponsive today. EXAM: PORTABLE CHEST 1 VIEW COMPARISON:  PA and lateral chest 09/04/2018. Single-view of the chest 10/29/2017. FINDINGS: The lungs are clear. Heart size is normal. Aortic atherosclerosis noted. No pneumothorax or pleural effusion. No acute bony abnormality. Remote right sixth and seventh rib fracture identified. Remote left clavicle fracture also noted. IMPRESSION: No acute disease. Atherosclerosis. Electronically Signed   By: Drusilla Kanner M.D.   On: 09/13/2018 14:26   Ct Head Code Stroke Wo Contrast  Result Date: 09/06/2018 CLINICAL DATA:  73 y/o F; seizure-like activity. Focal neural deficit.  Code stroke. EXAM: CT HEAD WITHOUT CONTRAST CT ANGIOGRAPHY HEAD AND NECK CT PERFUSION BRAIN TECHNIQUE: Multidetector CT imaging of the head and neck was performed using the standard protocol during bolus administration of intravenous contrast. Multiplanar CT image reconstructions and MIPs were obtained to evaluate the vascular anatomy. Carotid stenosis measurements (when applicable) are obtained utilizing NASCET criteria, using the distal internal carotid diameter as the denominator. Multiphase CT imaging of the brain was performed following IV bolus contrast injection. Subsequent parametric perfusion maps were calculated using RAPID software. CONTRAST:  ISOVUE-370 IOPAMIDOL (ISOVUE-370) INJECTION 76% COMPARISON:  07/19/2018 CT head. 07/09/2018 MRI head. FINDINGS: CT HEAD FINDINGS Brain: No evidence of acute infarction, hemorrhage, hydrocephalus, extra-axial collection or mass lesion/mass effect. 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 Stable chronic microvascular ischemic changes and volume loss of the brain. Vascular: As below. Skull: Normal. Negative for fracture or focal lesion. Sinuses/Orbits: Small fluid levels within the paranasal sinuses. Normal aeration of mastoid air cells. Orbits are unremarkable. Other: None. ASPECTS (Alberta Stroke Program Early CT Score) - Ganglionic level infarction (caudate, lentiform nuclei, internal capsule, insula, M1-M3 cortex): 7 - Supraganglionic infarction (M4-M6 cortex): 3 Total score (0-10 with 10 being normal): 10 Review of the MIP images confirms the above findings CTA NECK FINDINGS Aortic arch: Standard branching. Imaged portion shows no evidence of aneurysm or dissection. No significant stenosis of the major arch vessel origins. Moderate mixed plaque of the aortic arch. Right carotid system: No evidence of dissection, stenosis (50% or greater) or occlusion. Predominant fibrofatty plaque of the  carotid bifurcation  with minimal less than 30% proximal ICA stenosis. Left carotid system: No evidence of dissection, stenosis (50% or greater) or occlusion. Predominant fibrofatty plaque of the carotid bifurcation with mild less than 50% proximal ICA stenosis. Mild nonstenotic beaded irregularity of the left mid cervical ICA. Vertebral arteries: Codominant. No evidence of dissection, stenosis (50% or greater) or occlusion. Segments of non stenotic beaded irregularity of the vertebral arteries. Skeleton: Moderate severe spondylosis of the cervical spine. Multilevel uncovertebral and facet hypertrophy with bony foraminal encroachment. Spinal canal stenosis is greatest at the C6-7 level where it is mild-to-moderate. Grade 1 C7-T1 anterolisthesis. Other neck: Negative. Upper chest: Mild centrilobular emphysema of the lung apices. Review of the MIP images confirms the above findings CTA HEAD FINDINGS Anterior circulation: No significant stenosis, proximal occlusion, aneurysm, or vascular malformation. Non stenotic calcific atherosclerosis of carotid siphons. Posterior circulation: No significant stenosis, proximal occlusion, aneurysm, or vascular malformation. Mild right P2 stenosis. Venous sinuses: As permitted by contrast timing, patent. Anatomic variants: Complete circle-of-Willis. Right fetal PCA. Review of the MIP images confirms the above findings CT Brain Perfusion Findings: CBF (<30%) Volume: 0mL Perfusion (Tmax>6.0s) volume: 0mL Mismatch Volume: 0mL Infarction Location:Negative. IMPRESSION: CT head: 1. No acute intracranial abnormality identified.  ASPECTS is 10. 2. Stable chronic microvascular ischemic changes and volume loss of the brain. 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 CT perfusion head: Negative CT perfusion head. No acute stroke or ischemic penumbra by RAPID perfusion criteria. CTA neck: 1. Patent carotid and vertebral arteries. No dissection,  aneurysm, or high-grade stenosis by NASCET criteria. 2. Predominant fibrofatty plaque of bilateral carotid bifurcations with mild less than 50% proximal ICA stenosis. 3. 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 4. Moderate severe spondylosis of the cervical spine greatest at C6-7. CTA head: Patent anterior and posterior intracranial circulation. No large vessel occlusion, aneurysm, or significant stenosis is identified. These results were called by telephone at the time of interpretation on 09/06/2018 at 1:06 am to Dr. Bayard Males , who verbally acknowledged these results. Electronically Signed   By: Mitzi Hansen M.D.   On: 09/06/2018 01:09   [place] Ekg Site Rite  Result Date: 09/15/2018 If Site Rite image not attached, placement could not be confirmed due to current cardiac rhythm.    CBC Recent Labs  Lab 09/13/18 1435 09/14/18 0445 09/16/18 0351  WBC 14.0* 11.7* 6.8  HGB 13.5 11.0* 9.1*  HCT 43.2 35.1* 28.9*  PLT 284 213 144*  MCV 97.3 96.2 96.7  MCH 30.4 30.1 30.4  MCHC 31.3 31.3 31.5  RDW 16.1* 15.8* 15.2  LYMPHSABS 1.6  --   --   MONOABS 1.0  --   --   EOSABS 0.2  --   --   BASOSABS 0.1  --   --     Chemistries  Recent Labs  Lab 09/13/18 1435 09/14/18 0445 09/15/18 0458 09/16/18 0351  NA 140 141 140 141  K 5.2* 4.6 4.1 4.1  CL 105 109 109 112*  CO2 25 25 25 26   GLUCOSE 109* 149* 140* 125*  BUN 48* 43* 32* 27*  CREATININE 1.87* 1.71* 1.39* 1.32*  CALCIUM 9.4 8.4* 8.5* 8.3*  AST 20 15  --   --   ALT 10 8  --   --   ALKPHOS 84 73  --   --   BILITOT 0.6 0.5  --   --    ------------------------------------------------------------------------------------------------------------------ estimated creatinine clearance  is 32.8 mL/min (A) (by C-G formula based on SCr of 1.32 mg/dL (H)). ------------------------------------------------------------------------------------------------------------------ No  results for input(s): HGBA1C in the last 72 hours. ------------------------------------------------------------------------------------------------------------------ No results for input(s): CHOL, HDL, LDLCALC, TRIG, CHOLHDL, LDLDIRECT in the last 72 hours. ------------------------------------------------------------------------------------------------------------------ No results for input(s): TSH, T4TOTAL, T3FREE, THYROIDAB in the last 72 hours.  Invalid input(s): FREET3 ------------------------------------------------------------------------------------------------------------------ Recent Labs    09/14/18 1312  VITAMINB12 480    Coagulation profile No results for input(s): INR, PROTIME in the last 168 hours.  No results for input(s): DDIMER in the last 72 hours.  Cardiac Enzymes No results for input(s): CKMB, TROPONINI, MYOGLOBIN in the last 168 hours.  Invalid input(s): CK ------------------------------------------------------------------------------------------------------------------ Invalid input(s): POCBNP    Assessment & Plan  Pt is 5773 with recent hospitalization for sz and uti  *Acute encephalopathy due to seizure Patient's has not shown much improvement I have checked a ABG which shows no CO2 retention Ammonia levels normal B12 levels normal Her urine culture is not growing any bacteria She is afebrile, WBC was minimally elevated there does not seem to be any evidence of meningitis MRI shows no changes which was done yesterday Seen by neurology today Keppra increased Repeat EEG today   *Seizure: with recurrent sz  Higher dose Keppra, continue IV Vimpat  appreciate neurology input  *Possible aspiration event yesterday currently on Unasyn chest x-ray is negative   *UTI-discontinue IV antibiotic urine cultures negative  * Hypertension: Blood pressure meds on hold due to patient unable to take any oral meds  * Atrial fibrillation: Rate controlled;  Eliquis on hold due to patient unable to take oral meds  * History of stroke: The patient has a history of falls and likely has gait disturbance secondary to her remote strokes.   * DVT prophylaxis: As above  * GI prophylaxis: None  Prognosis poor palliative care consult will be placed     Code Status Orders  (From admission, onward)         Start     Ordered   09/13/18 1822  Full code  Continuous     09/13/18 1822        Code Status History    Date Active Date Inactive Code Status Order ID Comments User Context   09/08/2018 2254 09/09/2018 2026 DNR [id number]  Altamese DillingVachhani, Vaibhavkumar, MD Inpatient   09/06/2018 0728 09/08/2018 2254 Full Code [id number]  Arnaldo Nataliamond, Michael S, MD ED   08/28/2018 1307 08/31/2018 2356 Full Code [id number]  Ihor AustinPyreddy, Pavan, MD Inpatient   07/06/2018 1628 07/09/2018 2237 Full Code [id number]  Altamese DillingVachhani, Vaibhavkumar, MD ED   06/24/2018 1648 07/03/2018 1942 Full Code [id number]  Adrian SaranMody, Sital, MD Inpatient   05/20/2018 1847 05/21/2018 2212 Full Code [id number]  Ramonita LabGouru, Aruna, MD Inpatient   05/20/2018 1847 05/20/2018 1847 Full Code [id number]  Ramonita LabGouru, Aruna, MD Inpatient   12/31/2017 1538 01/04/2018 2130 DNR [id number]  Milagros LollSudini, Srikar, MD ED   12/13/2017 1630 12/15/2017 2016 Full Code [id number]  Alford HighlandWieting, Richard, MD ED   11/22/2017 0202 11/23/2017 2009 DNR [id number]  Bertrum SolSalary, Montell D, MD ED   11/22/2017 0150 11/22/2017 0202 Full Code [id number]  Salary, Evelena AsaMontell D, MD ED   11/01/2017 1026 11/02/2017 0155 DNR [id number]  Delfino LovettShah, Vipul, MD Inpatient   10/29/2017 1256 11/01/2017 1026 Full Code [id number]  Alford HighlandWieting, Richard, MD ED           Consults neurology   DVT Prophylaxis  Lovenox   Lab Results  Component Value Date   PLT 144 (L) 09/16/2018  Time Spent in minutes   35 minutes  Greater than 50% of time spent in care coordination and counseling patient regarding the condition and plan of care.   Auburn Bilberry M.D on 09/16/2018 at 10:21 AM  Between 7am to 6pm - Pager -  [phone]  After 6pm go to [email or web address] - Social research officer, government  [employer] [facility]   Office  [phone]

## 2018-09-16 NOTE — Care Management Important Message (Signed)
Important Message  Patient Details  Name: Destiny Mack MRN: [id number] Date of Birth: 11/03/1945   Medicare Important Message Given:  Other (see comment)    [name] [name] 09/16/2018, 11:54 AM

## 2018-09-16 NOTE — Care Management Important Message (Signed)
Important Message  Patient Details  Name: Destiny Mack MRN: [id number] Date of Birth: 01/29/1946   Medicare Important Message Given:  Other (see comment) Patient sleeping and no family in the room.  Left a copy of the Important Message from Medicare at bedside.   [name] A [name] 09/16/2018, 11:54 AM

## 2018-09-16 NOTE — Procedures (Signed)
History: 73 yo F being evaluated for seizures  Sedation: None  Technique: This is a 21 channel routine scalp EEG performed at the bedside with bipolar and monopolar montages arranged in accordance to the international 10/20 system of electrode placement. One channel was dedicated to EKG recording.    Background: There is a posterior dominant rhythm of 8 Hz which is seen at times, though most of the EEG occurs in drowsiness or sleep. With maximal wakefulness, there is some intermixed irregular that and delta activity. There is a single discharge with epileptiform appearance with wide bilateral-occipitally predominant field.    Photic stimulation: Physiologic driving is not performed  EEG Abnormalities: 1) Single discharge with bi-occipital predominance and sharp wave morphology.   Clinical Interpretation: This EEG is suggestive of a seizure predisposition. Though the discharge seen is not of clear localizing value.  No seizure was seen.   Destiny Slot, MD [facility] [phone]  If 7pm- 7am, please page neurology on call as listed in AMION.

## 2018-09-16 NOTE — Care Management Note (Signed)
Case Management Note  Patient Details  Name: Destiny BeringLinda M Mack MRN: [id number] Date of Birth: 03-19-1946  Subjective/Objective:   Re-admitted to [facility] with the diagnosis of acute cystitis. Daughter Destiny LeatherwoodKatherine is in the home [phone]). Sees Dr. Hessie DienerBender as primary care physician.  Home Health per [facility]. No skilled facility. No home oxygen. CAP services in the home. Hoyer lift, Hospital bed, wheelchair, and bedside commode in the home.                  Action/Plan: Transportation home per [facility].   Expected Discharge Date:                  Expected Discharge Plan:     In-House Referral:   yes  Discharge planning Services   yes  Post Acute Care Choice:    Choice offered to:     DME Arranged:    DME Agency:     HH Arranged:   yes HH Agency:   [facility]  Status of Service:     If discussed at Long Length of Stay Meetings, dates discussed:    Additional Comments:  Destiny GreetBrenda S Huey Scalia, RN MSN CCM Care Management [phone] 09/16/2018, 11:16 AM

## 2018-09-16 NOTE — Progress Notes (Signed)
Peripherally Inserted Central Catheter/Midline Placement  The IV Nurse has discussed with the patient and/or persons authorized to consent for the patient, the purpose of this procedure and the potential benefits and risks involved with this procedure.  The benefits include less needle sticks, lab draws from the catheter, and the patient may be discharged home with the catheter. Risks include, but not limited to, infection, bleeding, blood clot (thrombus formation), and puncture of an artery; nerve damage and irregular heartbeat and possibility to perform a PICC exchange if needed/ordered by physician.  Alternatives to this procedure were also discussed.  Bard Power PICC patient education guide, fact sheet on infection prevention and patient information card has been provided to patient /or left at bedside.    PICC/Midline Placement Documentation  PICC Double Lumen 09/16/18 Right Basilic 41 cm 0 cm (Active)  Indication for Insertion or Continuance of Line Limited venous access - need for IV therapy >5 days (PICC only) 09/16/2018  8:49 AM  Exposed Catheter (cm) 0 cm 09/16/2018  8:49 AM  Site Assessment Clean;Dry;Intact 09/16/2018  8:49 AM  Lumen #1 Status Flushed;Blood return noted 09/16/2018  8:49 AM  Lumen #2 Status Flushed;Blood return noted 09/16/2018  8:49 AM  Dressing Type Transparent 09/16/2018  8:49 AM  Dressing Status Clean;Dry;Intact;Antimicrobial disc in place 09/16/2018  8:49 AM  Dressing Intervention New dressing 09/16/2018  8:49 AM  Dressing Change Due 09/23/18 09/16/2018  8:49 AM    Daughter signed consent   Maximino Greenland 09/16/2018, 8:51 AM

## 2018-09-17 LAB — BLOOD GAS, ARTERIAL
Acid-base deficit: 1.5 mmol/L (ref 0.0–2.0)
Bicarbonate: 22.9 mmol/L (ref 20.0–28.0)
FIO2: 0.28
O2 Saturation: 97.9 %
PATIENT TEMPERATURE: 37
pCO2 arterial: 37 mmHg (ref 32.0–48.0)
pH, Arterial: 7.4 (ref 7.350–7.450)
pO2, Arterial: 103 mmHg (ref 83.0–108.0)

## 2018-09-17 MED ORDER — HYDRALAZINE HCL 20 MG/ML IJ SOLN
10.0000 mg | Freq: Four times a day (QID) | INTRAMUSCULAR | Status: DC | PRN
Start: 2018-09-17 — End: 2018-09-18

## 2018-09-17 NOTE — Progress Notes (Signed)
[facility] - [place] [facility]   PATIENT NAME: Destiny Mack    MR#:  [id number]  DATE OF BIRTH:  [birth year]  SUBJECTIVE:   No seizure overnight REVIEW OF SYSTEMS:    Unable to obtain patient lethargic  Tolerating Diet:npo      DRUG ALLERGIES:  No Known Allergies  VITALS:  Blood pressure (!) 143/62, pulse 61, temperature 97.8 F (36.6 C), temperature source Oral, resp. rate 18, SpO2 100 %.  PHYSICAL EXAMINATION:  Constitutional: Appears lethargic and chronically ill appearing.  HENT: Normocephalic. Marland Kitchen Oropharynx is clear and moist.  Eyes: Conjunctivae are normal. no scleral icterus.  Neck: Normal ROM. Neck supple. No JVD. No tracheal deviation. CVS: RRR, S1/S2 +, no murmurs, no gallops, no carotid bruit.  Pulmonary: Effort and breath sounds normal, no stridor, rhonchi, wheezes, rales.  Abdominal: Soft. BS +,  no distension, tenderness, rebound or guarding.  Musculoskeletal: Normal range of motion. No edema and no tenderness.  Neuro: lethargic. No focal deficits. Skin: Skin is warm and dry. No rash noted. Psychiatric: lethargic   LABORATORY PANEL:   CBC Recent Labs  Lab 09/16/18 0351  WBC 6.8  HGB 9.1*  HCT 28.9*  PLT 144*   ------------------------------------------------------------------------------------------------------------------  Chemistries  Recent Labs  Lab 09/14/18 0445  09/16/18 0351  NA 141   < > 141  K 4.6   < > 4.1  CL 109   < > 112*  CO2 25   < > 26  GLUCOSE 149*   < > 125*  BUN 43*   < > 27*  CREATININE 1.71*   < > 1.32*  CALCIUM 8.4*   < > 8.3*  AST 15  --   --   ALT 8  --   --   ALKPHOS 73  --   --   BILITOT 0.5  --   --    < > = values in this interval not displayed.   ------------------------------------------------------------------------------------------------------------------  Cardiac Enzymes No results for input(s): TROPONINI in the last 168  hours. ------------------------------------------------------------------------------------------------------------------  RADIOLOGY:  Mr Brain 32 Contrast  Result Date: 09/15/2018 CLINICAL DATA:  73 year old female with altered mental status, found down. Seizure. EXAM: MRI HEAD WITHOUT CONTRAST TECHNIQUE: Multiplanar, multiecho pulse sequences of the brain and surrounding structures were obtained without intravenous contrast. COMPARISON:  Head CT 09/13/2018.  Brain MRI 09/06/2018 and earlier. FINDINGS: Brain: No restricted diffusion or evidence of acute infarction. There are stable small areas of cortical encephalomalacia in the left anterior frontal gyrus, left occipital pole, and right superior temporal gyrus. Confluent bilateral cerebral white matter T2 and FLAIR hyperintensity. No definite chronic cerebral blood products. One or 2 small chronic lacunar infarcts in the right thalamus. Mild T2 heterogeneity in the pons. Negative cerebellum. No midline shift, mass effect, evidence of mass lesion, ventriculomegaly, extra-axial collection or acute intracranial hemorrhage. Cervicomedullary junction and pituitary are within normal limits. Vascular: Major intracranial vascular flow voids are stable. Skull and upper cervical spine: Widespread advanced cervical spine degeneration with up to moderate appearing degenerative spinal stenosis at C3-C4 with spinal cord mass effect (series 6, image 13), and a lesser degree of stenosis at most other visible levels. Visualized bone marrow signal is within normal limits. Sinuses/Orbits: Negative orbits. Fluid level in the left sphenoid sinus has increased from earlier this month. Mild right sphenoid opacification. Other: Mastoids remain clear. Grossly normal visible internal auditory structures. Scalp and face soft tissues appear negative. IMPRESSION: 1. Continued stable MRI appearance of the brain. No  acute intracranial abnormality. 2. Advanced cervical spine degeneration  with widespread spinal stenosis. Electronically Signed   By: Odessa Fleming M.D.   On: 09/15/2018 13:11   Dg Chest Port 1 View  Result Date: 09/15/2018 CLINICAL DATA:  Possible aspiration. EXAM: PORTABLE CHEST 1 VIEW COMPARISON:  September 15, 2018 FINDINGS: Again noted is calcified nodularity in the lateral left chest. The heart, hila, mediastinum, lungs, and pleura are otherwise unchanged. Possible mild atelectasis in the left base. IMPRESSION: No active disease. Electronically Signed   By: Gerome Sam III M.D   On: 09/15/2018 15:35   Dg Chest Port 1 View  Result Date: 09/15/2018 CLINICAL DATA:  73 year old female with altered mental status. Found down. Seizure. Cough. EXAM: PORTABLE CHEST 1 VIEW COMPARISON:  09/13/2018 and earlier. FINDINGS: Portable AP semi upright view at 1129 hours. Stable lung volumes and mediastinal contours. Mildly tortuous descending aorta. Allowing for portable technique the lungs are clear. Visualized tracheal air column is within normal limits. Negative visible bowel gas pattern. Subacute to chronic right lateral rib and left clavicle fractures. IMPRESSION: No acute cardiopulmonary abnormality. Electronically Signed   By: Odessa Fleming M.D.   On: 09/15/2018 13:12   [place] Ekg Site Rite  Result Date: 09/15/2018 If Site Rite image not attached, placement could not be confirmed due to current cardiac rhythm.    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  She has been admitted with encephalopathy. 1. Acute metabolic encephalopathy from recurrent seziures ABG this am looks ok Urine cx negative so no UTI   2. Recurrent seizures with EEG suggestive of a seizure predisposition Continue Keppra and Vimpat Continue seizure precautions  3. Hx of PAF: Eliquis on hold as patient not taking medications well  4. Essential HTN:  PRN hydralazine   Await PC consult      CODE STATUS: FULL  TOTAL TIME TAKING CARE OF THIS PATIENT: 22 minutes.     POSSIBLE D/C ??, DEPENDING ON CLINICAL CONDITION.   Terriyah Westra M.D on 09/17/2018 at 10:21 AM  Between 7am to 6pm - Pager - [phone] After 6pm go to [email or web address] - password [email or web address]  [facility]  Office  [phone]  CC: Primary care physician; Oswaldo Conroy, MD  Note: This dictation was prepared with Dragon dictation along with smaller phrase technology. Any transcriptional errors that result from this process are unintentional.

## 2018-09-17 NOTE — Progress Notes (Signed)
Palliative Note:   Destiny Mings, PA (student) and myself spoke with patient's daughter, Destiny Mack and introduced ourselves and palliative care. Daughter verbalized understanding. She expressed today was not the best day for her to meet and discuss goals of care however, she would like to meet on tomorrow 1/22 @10am .   GOC meeting is scheduled for 1/22 @ 10am with daughter as requested. Detailed notes and recommendations to follow.   Thank you for your referral.   Willette Alma, AGPCNP-BC Palliative Medicine Team  Phone: [phone] Fax: [phone] Pager: [phone] Amion: N. [place]   No charge

## 2018-09-17 NOTE — Consult Note (Addendum)
Consultation Note Date: 09/17/2018   Patient Name: Destiny Mack  DOB: July 23, 1946  MRN: [id number]  Age / Sex: 73 y.o., female  PCP: Letta Median, MD Referring Physician: Bettey Costa, MD  Reason for Consultation: Establishing goals of care and Psychosocial/spiritual support  HPI/Patient Profile: 73 y.o. female  with past medical history of CVA, a fib (on eliquis), hypertension, seizure disorder, dementia, and recurrent cystitis who was admitted on 09/13/2018 with cystitis with AKI and seizure like activity.  Patient's daughter reported she was unresponsive with generalized tonic-clonic seizure that was witnessed by EMS. Patient was recently discharged on 1/13 after hospitalization for acute cystitis and seizures.   Clinical Assessment and Goals of Care:  I have reviewed medical records including EPIC notes, labs and imaging, assessed the patient and then met at the bedside along with patient's daughter, Destiny Mack, to discuss diagnosis prognosis, [place], EOL wishes, disposition and options.  I introduced Palliative Medicine as specialized medical care for people living with serious illness. It focuses on providing relief from the symptoms and stress of a serious illness. The goal is to improve quality of life for both the patient and the family.  We discussed a brief life review of the patient. Destiny Mack describes her Mom as "spunky" and someone who has always been on the move. She tells [place] that her Mom was "full of life" and that her current state is not "her mom". She reports that Destiny Mack worked at [employer] for most of her life until about 10 years ago. Destiny Mack reports that her father (Unnamed's husband) was diagnosed with cancer in 1998 and they took care of him together. Since then Destiny Mack has lived with her daughter.   As far as functional and nutritional status, Destiny Mack reports this has steeply declined. She  has seen a dramatic decline since Destiny Mack's stroke in November of 2018. She adds that since her seizures have begun, she has especially seen a decline. She reports that Zayne is "bedridden" and that she helps her eat, bathe, and dress. She does have the CAP program along with home health aides and an RN that come to the house 3 times a week for bathing and extra support. Destiny Mack's son and their neighbor also help with Danaria's care.   In May 2019, Chaye was placed in a facility after DSS deemed Destiny Mack unable to fully care for her Mother. Destiny Mack feels that Carys was not taken care of well at the facility, as she fell multiple times and fractured her collar bone. In September of 2019, Prakriti returned back to Destiny Mack's home and has been their since. There case with DSS/APS is currently closed.   We discussed her current illness and what it means in the larger context of her on-going co-morbidities.  We discussed how dementia is a chronic, progressive illness that we cannot fix. Destiny Mack provided good insight to her mother's current condition and remarked that her Mom would never want to be a burden. She believes that it is time to take her Mother home and  focus on comfort. Destiny Mack desires her Mother to stay at home and have a natural passing at home.  The difference between aggressive medical intervention and comfort care was considered in light of the patient's goals of care. At this time, Destiny Mack has decided to shift gears to focusing on Destiny Mack's comfort. She reports her Mother would NEVER want to be placed on life support and would like to pass at home.   Advanced directives, concepts specific to code status, artifical feeding and hydration, and rehospitalization were considered and discussed. We discussed CPR and what it entails and Destiny Mack remarked that she believes CPR would cause more suffering and pain. She would like Destiny Mack to be DNR. Additionally, we went over and completed a MOST form that  indicates full comfort care with a trial of antibiotics if needed. Destiny Mack expresses that Destiny Mack would like to remain at home even if an acute event arises.   Hospice and Palliative Care services outpatient were explained and offered. Destiny Mack will be discharging tomorrow to home with hospice.   Questions and concerns were addressed.  The family was encouraged to call with questions or concerns.     Primary Decision Maker:  NEXT OF KIN, Patient's daughter, Destiny Mack    SUMMARY OF RECOMMENDATIONS    Code Status/Advance Care Planning:  DNR  MOST form completed: comfort care only, trial of antibiotics, no IV fluids, no feeding tube  Discharge tomorrow to home with hospice and oxygen  Case management consult placed  Symptom Management:   Morphine 57m PO q2h PRN for respiratory distress or pain  Ativan 154mq6h for anxiety  Continue keppra and vimpat for seizure prophylaxis  Continue Zoloft for anxiety  Continue zofran PRN for nausea  Additional Recommendations (Limitations, Scope, Preferences):  Full Comfort Care and trial of antibiotics as needed  Palliative Prophylaxis:   Aspiration, Frequent Pain Assessment and Turn Reposition  Psycho-social/Spiritual:   Desire for further Chaplaincy support: Did not ask  Prognosis:   Weeks to months, d/t high aspiration risk and advanced dementia.   Discharge Planning: Home with Hospice      Primary Diagnoses: Present on Admission: . Acute cystitis   I have reviewed the medical record, interviewed the patient and family, and examined the patient. The following aspects are pertinent.  Past Medical History:  Diagnosis Date  . [place]-fib ([facility]  . Depression   . Frequent falls   . Hypertension   . Leaking of urine   . Mixed incontinence   . Stroke ([facility]  . Tremor of hands and face    Social History   Socioeconomic History  . Marital status: Widowed    Spouse name: Not on file  . Number of children: 2  . Years of  education: Not on file  . Highest education level: Not on file  Occupational History  . Not on file  Social Needs  . Financial resource strain: Not on file  . Food insecurity:    Worry: Never true    Inability: Never true  . Transportation needs:    Medical: No    Non-medical: No  Tobacco Use  . Smoking status: Current Every Day Smoker    Packs/day: 2.00    Years: 56.00    Pack years: 112.00    Last attempt to quit: 11/22/2017    Years since quitting: 0.8  . Smokeless tobacco: Never Used  Substance and Sexual Activity  . Alcohol use: No    Frequency: Never  . Drug use: No  .  Sexual activity: Not Currently  Lifestyle  . Physical activity:    Days per week: 0 days    Minutes per session: 0 min  . Stress: Only a little  Relationships  . Social connections:    Talks on phone: Not on file    Gets together: Not on file    Attends religious service: Not on file    Active member of club or organization: Not on file    Attends meetings of clubs or organizations: Not on file    Relationship status: Not on file  Other Topics Concern  . Not on file  Social History Narrative  . Not on file   Family History  Problem Relation Age of Onset  . Dementia Mother   . Heart failure Mother   . Heart failure Father   . CAD Father    Scheduled Meds: . chlorhexidine  15 mL Mouth Rinse BID  . mouth rinse  15 mL Mouth Rinse q12n4p  . modafinil  100 mg Oral Daily  . sodium chloride flush  10-40 mL Intracatheter Q12H   Continuous Infusions: . sodium chloride Stopped (09/16/18 1200)  . dextrose 5 % and 0.45% NaCl 100 mL/hr at 09/17/18 0700  . lacosamide (VIMPAT) IV Stopped (09/16/18 2301)  . levETIRAcetam 750 mg (09/17/18 0927)   PRN Meds:.sodium chloride, acetaminophen **OR** acetaminophen, LORazepam, ondansetron **OR** ondansetron (ZOFRAN) IV, sodium chloride flush No Known Allergies Review of Systems  Unable to perform ROS: Dementia   Unable to complete, patient with dementia and  encephalopathy  Physical Exam Vitals signs and nursing note reviewed.  Constitutional:      General: She is sleeping.     Appearance: She is normal weight.     Comments: Chronically ill appearing   Neurological:     Mental Status: She is easily aroused.     Comments: Hx of dementia   Psychiatric:        Cognition and Memory: Cognition is impaired.        Judgment: Judgment is inappropriate.     General: elderly female, sleeping in bed, in no acute distress ENT: dry, cracked lips and mouth.   Vital Signs: BP (!) 143/62 (BP Location: Left Arm)   Pulse 61   Temp 97.8 F (36.6 C) (Oral)   Resp 18   LMP  (LMP Unknown) Comment: uta  SpO2 100%  Pain Scale: Faces   Pain Score: 0-No pain   SpO2: SpO2: 100 % O2 Device:SpO2: 100 % O2 Flow Rate: .O2 Flow Rate (L/min): 2 L/min  IO: Intake/output summary:   Intake/Output Summary (Last 24 hours) at 09/17/2018 1022 Last data filed at 09/17/2018 0700 Gross per 24 hour  Intake 2468.88 ml  Output 925 ml  Net 1543.88 ml    LBM: Last BM Date: 09/14/18 Baseline Weight:   Most recent weight:       Palliative Assessment/Data:20%   Flowsheet Rows     Most Recent Value  Intake Tab  Referral Department  Hospitalist  Unit at Time of Referral  Med/Surg Unit  Date Notified  09/16/18  Palliative Care Type  Return patient Palliative Care  Reason for referral  Clarify Goals of Care  Date of Admission  09/13/18  # of days IP prior to Palliative referral  3  Clinical Assessment  Psychosocial & Spiritual Assessment  Palliative Care Outcomes      Time In: 10:00 Time Out: 11:15 Time Total: 75 minutes  Greater than 50%  of this time was spent  counseling and coordinating care related to the above assessment and plan.  Signed by: Ferrel Logan, PA-S 09/17/2018  Alda Lea, AGPCNP-BC Palliative Medicine Team  Phone: [phone] Fax: [phone] Pager: [phone] Amion: Bjorn Pippin    Please contact Palliative  Medicine Team phone at [phone] for questions and concerns.  For individual provider: See Shea Evans

## 2018-09-17 NOTE — Progress Notes (Signed)
Subjective: No further seizures overnight, slightly better this morning.  Exam: Vitals:   09/16/18 1937 09/17/18 0424  BP: (!) 125/49 (!) 143/62  Pulse: 80 61  Resp: 16 18  Temp: 98.6 F (37 C) 97.8 F (36.6 C)  SpO2: 98% 100%   Gen: In bed, NAD Resp: non-labored breathing, no acute distress Abd: soft, nt  Neuro: MS: She is lethargic.  She opens eyes to voice today, though it does require couple of prompts, she tells me [facility] and [facility], but does not answer the month.  She does follow commands readily. CN: Pupils equal reactive, she fixates and tracks across midline in both directions Motor: She does not lift either extremity against gravity, but does squeeze fingers bilaterally. 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  She has been admitted with encephalopathy which was presumed secondary to urinary tract infection, though culture does not show any growth.  She appears to be improved compared to yesterday, though I still am not certain that dementia/postictal state alone are enough to explain her fairly dense encephalopathy.  I will need to further elucidate her baseline.  Also, I think an arterial blood gas might be helpful.   Recommendations: 1) continue Keppra 750 twice daily(started in early January at 500 mg twice daily, increased to 750 on 1/20 given improving renal function) 2) Vimpat 200 mg twice daily(started with this hospitalization) 3) ABG  Ritta Slot, MD [facility] [phone]  If 7pm- 7am, please page neurology on call as listed in AMION.

## 2018-09-17 NOTE — Care Management Important Message (Signed)
Important Message  Patient Details  Name: Destiny Mack MRN: [id number] Date of Birth: 1946/06/07   Medicare Important Message Given:  Yes Patient unable to sign form.  Care Manager directed me to call her daughter, [name] with whom she resides with.  I reviewed the Important Message from Medicare and left a copy at her bedside if the need for an appeal arises.   [name] A Lorin Hauck 09/17/2018, 12:25 PM

## 2018-09-18 DIAGNOSIS — Z66 Do not resuscitate: Secondary | ICD-10-CM

## 2018-09-18 DIAGNOSIS — Z7189 Other specified counseling: Secondary | ICD-10-CM

## 2018-09-18 DIAGNOSIS — Z515 Encounter for palliative care: Secondary | ICD-10-CM

## 2018-09-18 DIAGNOSIS — N390 Urinary tract infection, site not specified: Secondary | ICD-10-CM

## 2018-09-18 DIAGNOSIS — T83511A Infection and inflammatory reaction due to indwelling urethral catheter, initial encounter: Secondary | ICD-10-CM

## 2018-09-18 MED ORDER — MORPHINE SULFATE (CONCENTRATE) 10 MG/0.5ML PO SOLN: 5.0000 mg | ORAL | 0 refills | Status: DC | PRN

## 2018-09-18 MED ORDER — MORPHINE SULFATE (CONCENTRATE) 10 MG/0.5ML PO SOLN: 5.0000 mg | ORAL | Status: DC | PRN

## 2018-09-18 MED ORDER — LACOSAMIDE 10 MG/ML PO SOLN: 200.0000 mg | Freq: Two times a day (BID) | ORAL | 0 refills | Status: AC

## 2018-09-18 MED ORDER — LEVETIRACETAM 100 MG/ML PO SOLN: 750.0000 mg | Freq: Two times a day (BID) | ORAL | 0 refills | Status: AC

## 2018-09-18 MED ORDER — LORAZEPAM 1 MG PO TABS: 1.0000 mg | ORAL_TABLET | Freq: Four times a day (QID) | ORAL | Status: DC | PRN

## 2018-09-18 NOTE — Discharge Summary (Addendum)
[facility] [place] [facility]   PATIENT NAME: Destiny Mack    MR#:  [id number]  DATE OF BIRTH:  18-Sep-1945  DATE OF ADMISSION:  09/13/2018 ADMITTING PHYSICIAN: Ramonita LabAruna Gouru, MD  DATE OF DISCHARGE: 09/19/2018  PRIMARY CARE PHYSICIAN: Oswaldo ConroyBender, [name], MD    ADMISSION DIAGNOSIS:  Seizure (HCC) [R56.9] Altered mental status, unspecified altered mental status type [R41.82] Urinary tract infection associated with indwelling urethral catheter, initial encounter (HCC) [T83.511A, N39.0]  DISCHARGE DIAGNOSIS:  Active Problems:   Acute cystitis   SECONDARY DIAGNOSIS:   Past Medical History:  Diagnosis Date  . A-fib (HCC)   . Depression   . Frequent falls   . Hypertension   . Leaking of urine   . Mixed incontinence   . Stroke (HCC)   . 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 She has been admitted with encephalopathy.  1. Acute metabolic encephalopathy from recurrent seziures Patient was more alert this am   2. Recurrent seizures with EEG suggestive of a seizure  Continue Keppra and Vimpat as per neurology recommendations.  .   3. Hx of PAF: Patient can resume Eliquis  4. Essential HTN:   Patient with overall poor prognosis.  Patient will be discharged home with hospice care.  Discussed with family at bedside and palliative care.    DISCHARGE CONDITIONS AND DIET:   Guarded condition diet as tolerated patient will be discharged with Foley catheter and oxygen for comfort  CONSULTS OBTAINED:    DRUG ALLERGIES:  No Known Allergies  DISCHARGE MEDICATIONS:   Allergies as of 09/19/2018   No Known Allergies     Medication List    STOP taking these medications   acetaminophen 500 MG tablet Commonly known as:  TYLENOL   amLODipine 5 MG tablet Commonly  known as:  NORVASC   apixaban 5 MG Tabs tablet Commonly known as:  ELIQUIS   atorvastatin 20 MG tablet Commonly known as:  LIPITOR   levETIRAcetam 500 MG tablet Commonly known as:  KEPPRA Replaced by:  levETIRAcetam 100 MG/ML solution     TAKE these medications   lacosamide 10 MG/ML oral solution Commonly known as:  VIMPAT Take 20 mLs (200 mg total) by mouth 2 (two) times daily.   levETIRAcetam 100 MG/ML solution Commonly known as:  KEPPRA Take 7.5 mLs (750 mg total) by mouth 2 (two) times daily. Replaces:  levETIRAcetam 500 MG tablet   morphine CONCENTRATE 10 MG/0.5ML Soln concentrated solution Take 0.25 mLs (5 mg total) by mouth every 2 (two) hours as needed for moderate pain, severe pain or shortness of breath.   nicotine 21 mg/24hr patch Commonly known as:  NICODERM CQ - dosed in mg/24 hours Place 1 patch (21 mg total) onto the skin daily.   senna-docusate 8.6-50 MG tablet Commonly known as:  Senokot-S Take 1 tablet by mouth at bedtime as needed for mild constipation.   sertraline 50 MG tablet Commonly known as:  ZOLOFT Take 50 mg by mouth daily.            Durable Medical Equipment  (From admission, onward)         Start     Ordered   09/18/18 1032  For home use only DME oxygen  Once    Question Answer Comment  Mode or (  Route) Nasal cannula   Liters per Minute 2   Frequency Continuous (stationary and portable oxygen unit needed)   Oxygen conserving device Yes   Oxygen delivery system Gas      09/18/18 1031            Today   CHIEF COMPLAINT:   Patient was more alert this morning.  No seizure reported   VITAL SIGNS:  Blood pressure (!) 149/72, pulse 81, temperature 97.7 F (36.5 C), resp. rate 14, SpO2 99 %.   REVIEW OF SYSTEMS:  Review of Systems  Unable to perform ROS: Critical illness     PHYSICAL EXAMINATION:  GENERAL:  73 y.o.-year-old patient lying in the bed with no acute distress.  Critically ill appearing NECK:   Supple, no jugular venous distention. No thyroid enlargement, no tenderness.  LUNGS: Normal breath sounds bilaterally, no wheezing, rales,rhonchi  No use of accessory muscles of respiration.  CARDIOVASCULAR: S1, S2 normal. No murmurs, rubs, or gallops.  ABDOMEN: Soft, non-tender, non-distended. Bowel sounds present. No organomegaly or mass.  EXTREMITIES: No pedal edema, cyanosis, or clubbing.  PSYCHIATRIC: The patient is sleeping   SKIN: No obvious rash, lesion, or ulcer.   DATA REVIEW:   CBC Recent Labs  Lab 09/16/18 0351  WBC 6.8  HGB 9.1*  HCT 28.9*  PLT 144*    Chemistries  Recent Labs  Lab 09/14/18 0445  09/16/18 0351  NA 141   < > 141  K 4.6   < > 4.1  CL 109   < > 112*  CO2 25   < > 26  GLUCOSE 149*   < > 125*  BUN 43*   < > 27*  CREATININE 1.71*   < > 1.32*  CALCIUM 8.4*   < > 8.3*  AST 15  --   --   ALT 8  --   --   ALKPHOS 73  --   --   BILITOT 0.5  --   --    < > = values in this interval not displayed.    Cardiac Enzymes No results for input(s): TROPONINI in the last 168 hours.  Microbiology Results  @MICRORSLT48 @  RADIOLOGY:  No results found.    Allergies as of 09/19/2018   No Known Allergies     Medication List    STOP taking these medications   acetaminophen 500 MG tablet Commonly known as:  TYLENOL   amLODipine 5 MG tablet Commonly known as:  NORVASC   apixaban 5 MG Tabs tablet Commonly known as:  ELIQUIS   atorvastatin 20 MG tablet Commonly known as:  LIPITOR   levETIRAcetam 500 MG tablet Commonly known as:  KEPPRA Replaced by:  levETIRAcetam 100 MG/ML solution     TAKE these medications   lacosamide 10 MG/ML oral solution Commonly known as:  VIMPAT Take 20 mLs (200 mg total) by mouth 2 (two) times daily.   levETIRAcetam 100 MG/ML solution Commonly known as:  KEPPRA Take 7.5 mLs (750 mg total) by mouth 2 (two) times daily. Replaces:  levETIRAcetam 500 MG tablet   morphine CONCENTRATE 10 MG/0.5ML Soln concentrated  solution Take 0.25 mLs (5 mg total) by mouth every 2 (two) hours as needed for moderate pain, severe pain or shortness of breath.   nicotine 21 mg/24hr patch Commonly known as:  NICODERM CQ - dosed in mg/24 hours Place 1 patch (21 mg total) onto the skin daily.   senna-docusate 8.6-50 MG tablet Commonly known as:  Senokot-S Take 1  tablet by mouth at bedtime as needed for mild constipation.   sertraline 50 MG tablet Commonly known as:  ZOLOFT Take 50 mg by mouth daily.            Durable Medical Equipment  (From admission, onward)         Start     Ordered   09/18/18 1032  For home use only DME oxygen  Once    Question Answer Comment  Mode or (Route) Nasal cannula   Liters per Minute 2   Frequency Continuous (stationary and portable oxygen unit needed)   Oxygen conserving device Yes   Oxygen delivery system Gas      09/18/18 1031             Management plans discussed with the patient's daughter and she is in agreement. Stable for discharge home with hospice  Patient should follow up with hospice  CODE STATUS:     Code Status Orders  (From admission, onward)         Start     Ordered   09/18/18 1111  Do not attempt resuscitation (DNR)  Continuous    Question Answer Comment  In the event of cardiac or respiratory ARREST Do not call a "code blue"   In the event of cardiac or respiratory ARREST Do not perform Intubation, CPR, defibrillation or ACLS   In the event of cardiac or respiratory ARREST Use medication by any route, position, wound care, and other measures to relive pain and suffering. May use oxygen, suction and manual treatment of airway obstruction as needed for comfort.      09/18/18 1113        Code Status History    Date Active Date Inactive Code Status Order ID Comments User Context   09/13/2018 1822 09/18/2018 1113 Full Code [id number]  Ramonita Lab, MD ED   09/08/2018 2254 09/09/2018 2026 DNR [id number]  Altamese Dilling, MD Inpatient    09/06/2018 0728 09/08/2018 2254 Full Code [id number]  Arnaldo Natal, MD ED   08/28/2018 1307 08/31/2018 2356 Full Code [id number]  Ihor Austin, MD Inpatient   07/06/2018 1628 07/09/2018 2237 Full Code [id number]  Altamese Dilling, MD ED   06/24/2018 1648 07/03/2018 1942 Full Code [id number]  Adrian Saran, MD Inpatient   05/20/2018 1847 05/21/2018 2212 Full Code [id number]  Ramonita Lab, MD Inpatient   05/20/2018 1847 05/20/2018 1847 Full Code [id number]  Ramonita Lab, MD Inpatient   12/31/2017 1538 01/04/2018 2130 DNR [id number]  Milagros Loll, MD ED   12/13/2017 1630 12/15/2017 2016 Full Code [id number]  Alford Highland, MD ED   11/22/2017 0202 11/23/2017 2009 DNR [id number]  Bertrum Sol, MD ED   11/22/2017 0150 11/22/2017 0202 Full Code [id number]  Salary, Evelena Asa, MD ED   11/01/2017 1026 11/02/2017 0155 DNR [id number]  Delfino Lovett, MD Inpatient   10/29/2017 1256 11/01/2017 1026 Full Code [id number]  Alford Highland, MD ED      TOTAL TIME TAKING CARE OF THIS PATIENT: 38 minutes.    Note: This dictation was prepared with Dragon dictation along with smaller phrase technology. Any transcriptional errors that result from this process are unintentional.  Kinnedy Mongiello M.D on 09/19/2018 at 9:49 AM  Between 7am to 6pm - Pager - [phone] After 6pm go to [email or web address] - Social research officer, government  [facility]  Office  [phone]  CC: Primary care physician; Oswaldo Conroy, MD

## 2018-09-18 NOTE — Progress Notes (Signed)
[employer] - [place] at [facility]   PATIENT NAME: Destiny Mack    MR#:  [id number]  DATE OF BIRTH:  Jan 01, 1946  SUBJECTIVE:   No seizure overnight REVIEW OF SYSTEMS:    Unable to obtain patient lethargic  Tolerating Diet:npo      DRUG ALLERGIES:  No Known Allergies  VITALS:  Blood pressure (!) 151/74, pulse 79, temperature 98.8 F (37.1 C), temperature source Oral, resp. rate 18, SpO2 98 %.  PHYSICAL EXAMINATION:  Constitutional: Appears lethargic and chronically ill appearing.  HENT: Normocephalic. Marland Kitchen Oropharynx is clear and moist.  Eyes: Conjunctivae are normal. no scleral icterus.  Neck: Normal ROM. Neck supple. No JVD. No tracheal deviation. CVS: RRR, S1/S2 +, no murmurs, no gallops, no carotid bruit.  Pulmonary: Effort and breath sounds normal, no stridor, rhonchi, wheezes, rales.  Abdominal: Soft. BS +,  no distension, tenderness, rebound or guarding.  Musculoskeletal: Normal range of motion. No edema and no tenderness.  Neuro: lethargic. No focal deficits. Skin: Skin is warm and dry. No rash noted. Psychiatric: lethargic   LABORATORY PANEL:   CBC Recent Labs  Lab 09/16/18 0351  WBC 6.8  HGB 9.1*  HCT 28.9*  PLT 144*   ------------------------------------------------------------------------------------------------------------------  Chemistries  Recent Labs  Lab 09/14/18 0445  09/16/18 0351  NA 141   < > 141  K 4.6   < > 4.1  CL 109   < > 112*  CO2 25   < > 26  GLUCOSE 149*   < > 125*  BUN 43*   < > 27*  CREATININE 1.71*   < > 1.32*  CALCIUM 8.4*   < > 8.3*  AST 15  --   --   ALT 8  --   --   ALKPHOS 73  --   --   BILITOT 0.5  --   --    < > = values in this interval not displayed.   ------------------------------------------------------------------------------------------------------------------  Cardiac Enzymes No results for input(s): TROPONINI in the last 168  hours. ------------------------------------------------------------------------------------------------------------------  RADIOLOGY:  No results found.   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 She has been admitted with encephalopathy.  1. Acute metabolic encephalopathy from recurrent seziures Patient was more alert this am   2. Recurrent seizures with EEGsuggestive of a seizure  Continue Keppra and Vimpat as per neurology recommendations.  .   3. Hx of PAF: Patient can resume Eliquis  4. Essential HTN:   Patient with overall poor prognosis.  Patient will be discharged home with hospice care.  Discussed with family at bedside and palliative care.     CODE STATUS: FULL  TOTAL TIME TAKING CARE OF THIS PATIENT: 29 minutes.     POSSIBLE D/C tomorrow hospice at HOME?, DEPENDING ON CLINICAL CONDITION.   Kasen Sako M.D on 09/18/2018 at 12:09 PM  Between 7am to 6pm - Pager - [phone] After 6pm go to [email or web address] - password [employer]  [facility]  Office  [phone]  CC: Primary care physician; Oswaldo Conroy, MD  Note: This dictation was prepared with Dragon dictation along with smaller phrase technology. Any transcriptional errors that result from this process are unintentional.

## 2018-09-18 NOTE — Progress Notes (Signed)
Subjective: No seizures, continues to make slow progress.  Apparently, she was bedridden before this and due to previously poor quality of life, there has been discussion of going home with hospice  Exam: Vitals:   09/18/18 0435 09/18/18 0841  BP: (!) 155/69 (!) 151/74  Pulse: 81 79  Resp: 20 18  Temp: 98.5 F (36.9 C) 98.8 F (37.1 C)  SpO2: 99% 98%   Gen: In bed, NAD Resp: non-labored breathing, no acute distress Abd: soft, nt  Neuro: MS: She is lethargic.  She opens eyes readily to voice, follows simple commands.  CN: Pupils equal reactive, she fixates and tracks across midline in both directions Motor: She does not lift either extremity against gravity, but does squeeze fingers bilaterally. 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  She has been admitted with encephalopathy which was presumed secondary to urinary tract infection, though culture does not show any growth.  I suspect that she may have some component of delirium that will gradually improve over time now that her seizures are under control. I would favor continuing her ant-seizure medications even if she goes home with hospice.     Recommendations: 1) continue Keppra 750 twice daily(started in early January at 500 mg twice daily, increased to 750 on 1/20 given improving renal function) 2) Vimpat 200 mg twice daily(started with this hospitalization) 3) Please call if neurology can be of any further assistance.   Ritta Slot, MD [facility] [phone]  If 7pm- 7am, please page neurology on call as listed in AMION.

## 2018-09-18 NOTE — Care Management (Signed)
Palliative care family meeting completed. Daughter Destiny Mack would like to take her mother home with hospice services in place. Discussed agencies with daughter at the bedside. [facility]ose [facility] [address]. Dayna Barker RN representative for [employer] updated. Daughter states she does need oxygen in the home

## 2018-09-18 NOTE — Progress Notes (Signed)
New referral for [facility] services at home received from [facility] following a Palliative Medicine consult. Patient is a 73 year old woman with a PMH of seizure disorder, stroke, dementia, chronic A fib on Eliquis, recurrent UTI's, and indwelling foley, admitted to [facility] on 1/17 with cystitis with AKI and seizure like activity . She had a previous admission from 1/9-1/13/20 for same. Palliative medicine was consulted for goals of care and met with patient's daughter Belenda Cruise and she has chosen to have patient return home with the support of hospice services.  Patient seen lying in bed, eyes closed, oxygen in place at 2 liters via nasal cannula. Patient did not awaken to voice or gentle touch. Per chart note review she has been alert at times and able to answer questions and follow commands. Writer spoke via telephone to Belenda Cruise to initiate education regarding hospice services, philosophy and team approach to care with understanding voiced. Patient will require oxygen to be in place prior to discharge. Oxygen ordered for delivery today. Katherine in agreement. Address and contact phone number confirmed with Belenda Cruise. Other DME needs to be assessed at admission, patient does currently have a hospital bed in place at home.  Hospice information and contact number left in patient's room [facility] aware. Patient will require EMS transport with signed DNR in place. Hospital care team updated. Patient information faxed to referral.Will continue to follow through discharge. Thank yo. Flo Shanks RN, BSN, [employer] and [facility] [place], [facility] [phone]

## 2018-09-19 MED ORDER — MORPHINE SULFATE (CONCENTRATE) 10 MG/0.5ML PO SOLN: 5.0000 mg | ORAL | 0 refills | Status: AC | PRN

## 2018-09-19 NOTE — Progress Notes (Signed)
Called Destiny Mack cox to let her know EMS was here to pick up daughter, dentures and patient socks were given to EMS in discharge bag, daughter confirmed those were the only items belonging to the  patient

## 2018-09-19 NOTE — Progress Notes (Signed)
Dr Clement SayresKatherine Cox called and discharge instructions, homes meds and prescriptions with reviewed over phone with daughter and she  verbalized complete understanding

## 2018-09-19 NOTE — Progress Notes (Signed)
Follow up visit made to new referral for [facility] services at home. Patient seen lying in bed, eyes open, no verbal response noted. Per staff RN Thayer Ohm she did eat a few bites this morning. Patient will receive her IV anti-seizure mediations prior to discharge. Discharge summary faxed to referral. Patient to discharge home via EMS. Signed out of facility DNR in place in discharge packet.  Dayna Barker RN, BSN, [facility] [place], hospital liaison [phone]

## 2018-09-19 NOTE — Care Management Note (Signed)
Case Management Note  Patient Details  Name: FRANCESCA RIFF MRN: [id number] Date of Birth: November 02, 1945  Subjective/Objective: Patient discharging home with hospice.  RN to call EMS and schedule transport.  DNR and medical necessity in the discharge packet. Robbie Lis RN BSN [phone]                    Action/Plan:   Expected Discharge Date:  09/19/18               Expected Discharge Plan:  Home w Hospice Care  In-House Referral:     Discharge planning Services  CM Consult  Post Acute Care Choice:    Choice offered to:  Patient  DME Arranged:    DME Agency:     HH Arranged:    HH Agency:  [facility]  Status of Service:  Completed, signed off  If discussed at Long Length of Stay Meetings, dates discussed:    Additional Comments:  Allayne Butcher, RN 09/19/2018, 10:49 AM

## 2018-10-27 DEATH — deceased

## 2019-07-12 IMAGING — MR MR HEAD W/O CM
10 of 11 series · 41 of 48 positions shown · non-contrast
Comparison: Comparison made with prior CT from 06/24/2018 as well
as recent MRI from 05/21/2018.

CLINICAL DATA: Initial evaluation for acute syncopal episode.

EXAM:
MRI HEAD WITHOUT CONTRAST
TECHNIQUE: Multiplanar, multiecho pulse sequences of the brain and surrounding
structures were obtained without intravenous contrast.

[Series 2: ax dwi_tracew · axial · 3.0mm · 0.83mm/px · z∈[-89,+73]mm · 8 of 55 slices shown]
[im 1/55]
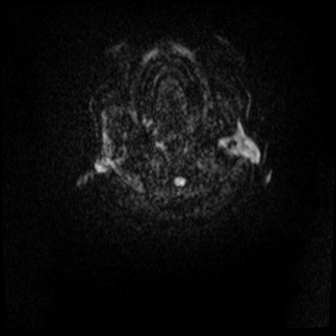
[im 8/55]
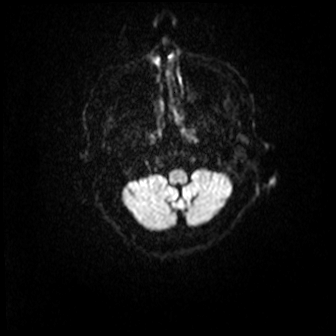
[im 16/55]
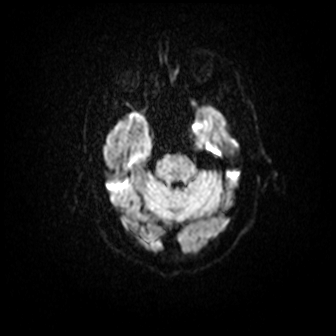
[im 24/55]
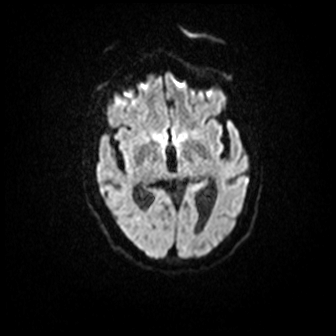
[im 31/55]
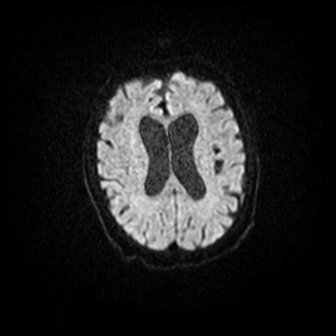
[im 39/55]
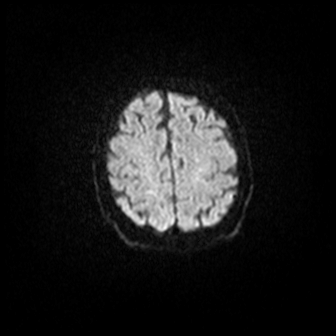
[im 47/55]
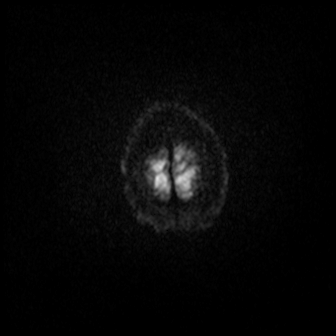
[im 55/55]
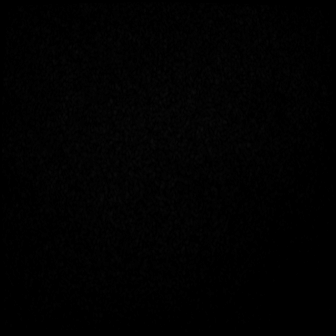

[Series 3: ax dwi_adc · axial · 3.0mm · 0.83mm/px · z∈[-89,+64]mm · 7 of 52 slices shown]
[im 1/52]
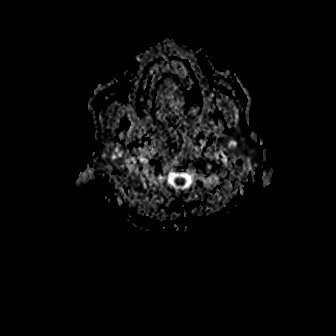
[im 9/52]
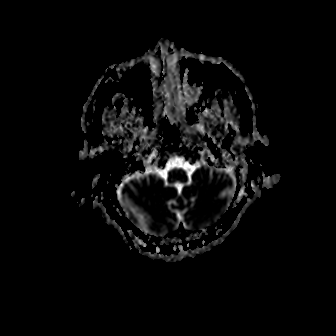
[im 18/52]
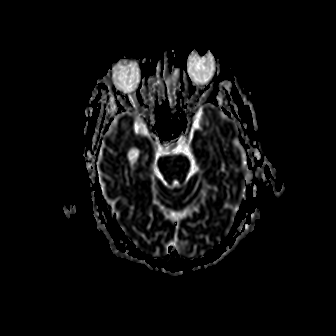
[im 26/52]
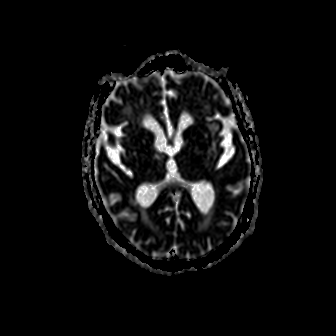
[im 35/52]
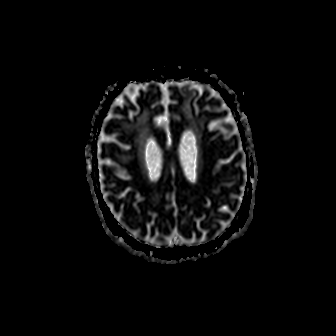
[im 43/52]
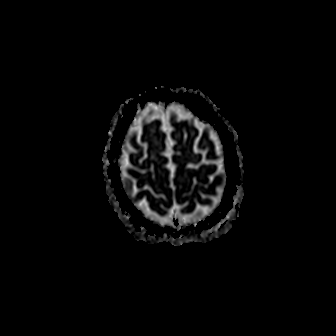
[im 52/52]
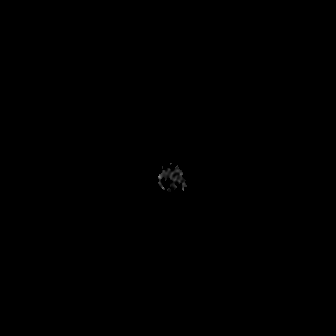

[Series 4: cor dwi_tracew · coronal · 5.0mm · 0.68mm/px · 6 of 45 slices shown]
[im 1/45]
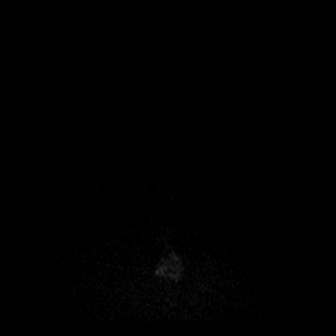
[im 9/45]
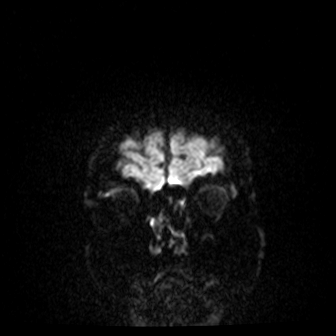
[im 18/45]
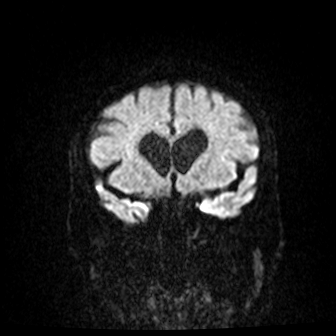
[im 27/45]
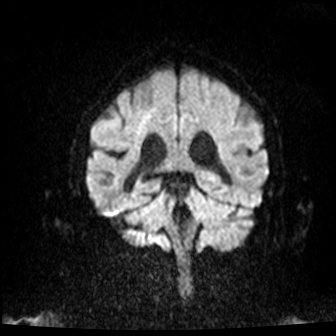
[im 36/45]
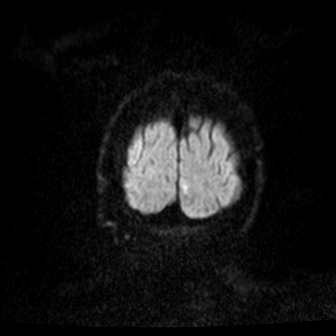
[im 45/45]
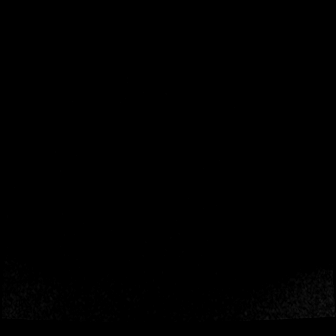

[Series 5: cor dwi_adc · coronal · 5.0mm · 0.68mm/px · 1 of 41 slices shown]
[im 1/41]
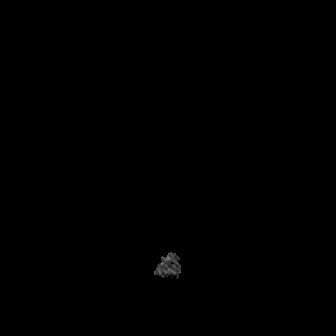

[Series 6: T1 · sagittal · 5.0mm · 0.94mm/px · 3 of 25 slices shown (1 of 3)]
[im 1/25]
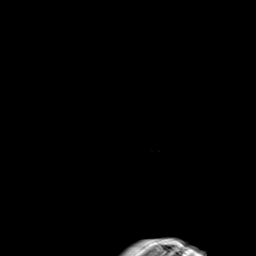
[im 13/25]
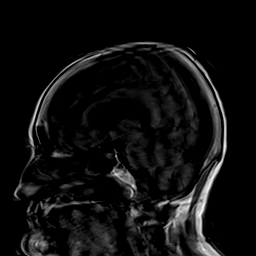
[im 25/25]
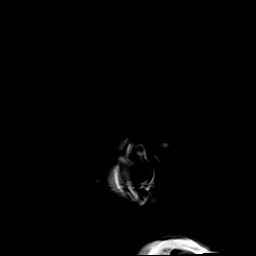

[Series 7: FLAIR · axial · 5.0mm · 1.20mm/px · z∈[-81,+75]mm · 3 of 27 slices shown]
[im 1/27]
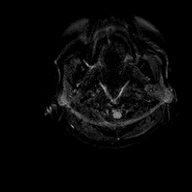
[im 14/27]
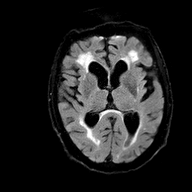
[im 27/27]
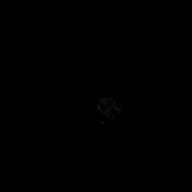

[Series 9: T2 · axial · 5.0mm · 0.45mm/px · z∈[-81,+75]mm · 3 of 27 slices shown (1 of 2)]
[im 1/27]
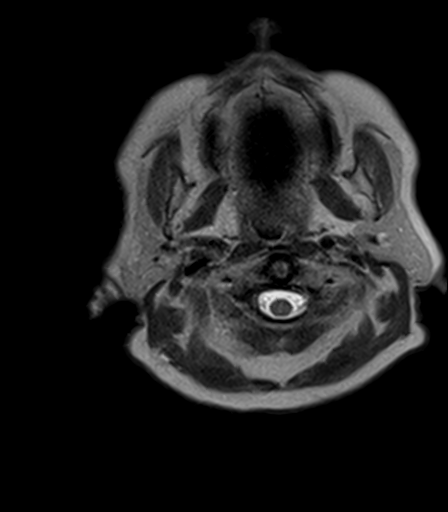
[im 14/27]
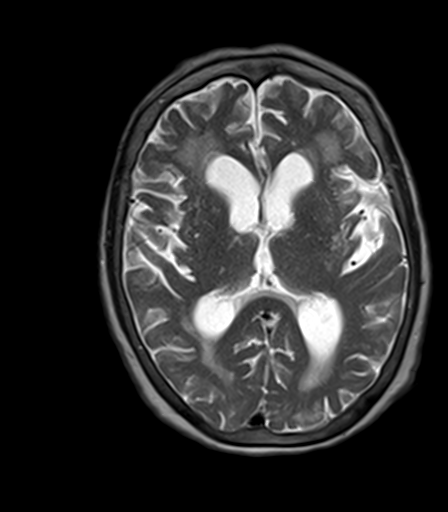
[im 27/27]
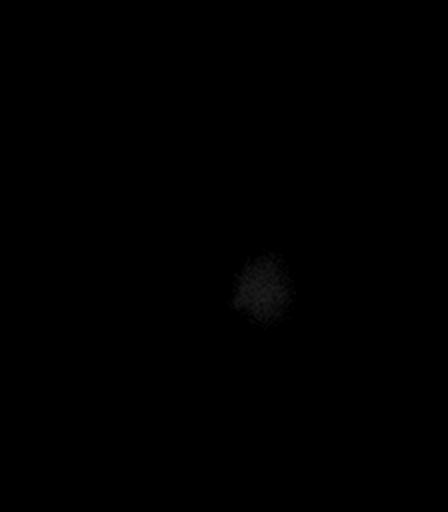

[Series 10: T1 · axial · 5.0mm · 0.90mm/px · z∈[-81,+75]mm · 3 of 27 slices shown (2 of 3)]
[im 1/27]
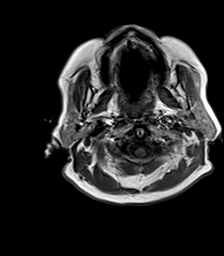
[im 14/27]
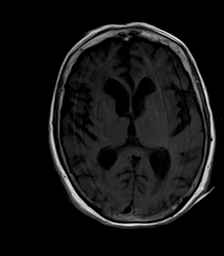
[im 27/27]
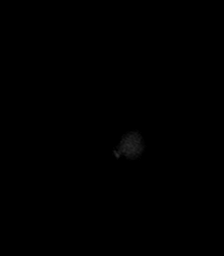

[Series 11: T2 · coronal · 5.0mm · 0.45mm/px · 4 of 31 slices shown (2 of 2)]
[im 1/31]
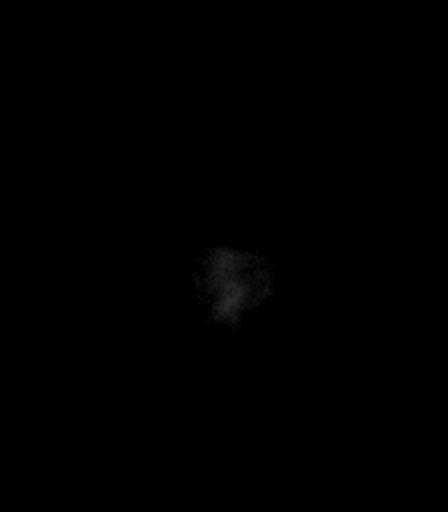
[im 11/31]
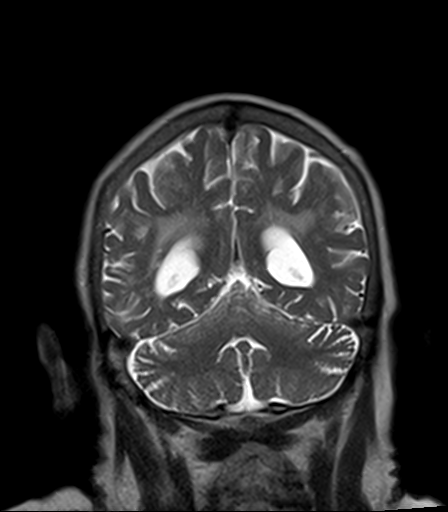
[im 21/31]
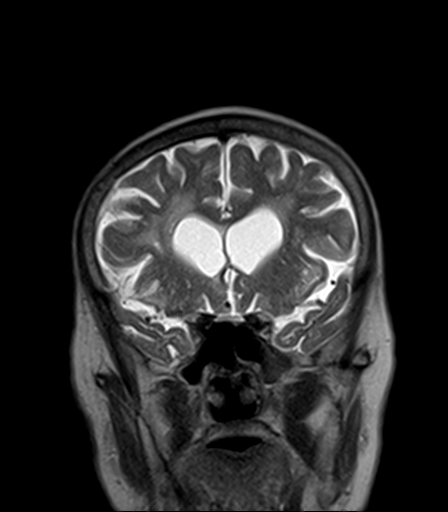
[im 31/31]
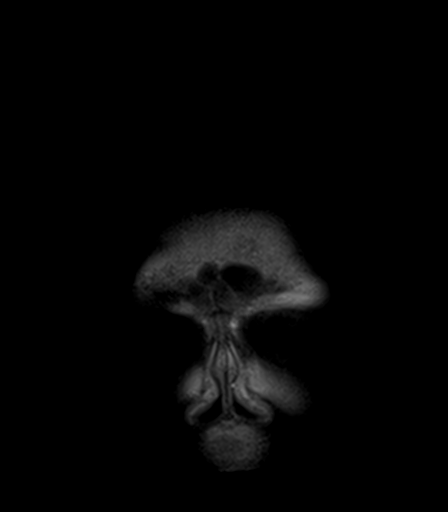

[Series 12: T1 · sagittal · 5.0mm · 0.94mm/px · 3 of 25 slices shown (3 of 3)]
[im 1/25]
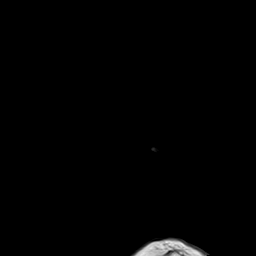
[im 13/25]
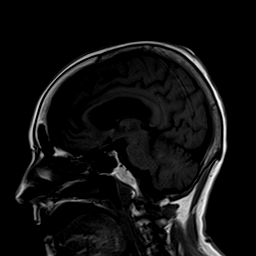
[im 25/25]
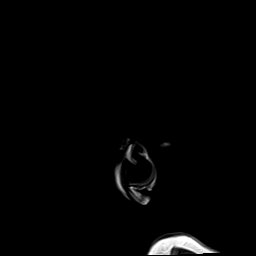

[41 of 48 positions shown; findings below may reference images not displayed]

FINDINGS: Brain: Diffuse prominence of the CSF containing spaces compatible
with generalized cerebral atrophy. Confluent T2/FLAIR hyperintensity
within the periventricular and deep white matter both cerebral
hemispheres most consistent with chronic microvascular ischemic
disease, fairly advanced in nature. Superimposed small remote
cortical infarcts seen involving the anterior left frontal lobe,
right temporal lobe, and left occipital pole, stable from previous.
Associated T2 shine through noted about the left occipital lobe
infarct, also unchanged. Few additional scattered superimposed
remote lacunar infarcts within the hemispheric cerebral white
matter, pons, and left cerebellum.

No abnormal foci of restricted diffusion to suggest acute or
subacute ischemia. Gray-white matter differentiation otherwise
maintained. No foci of susceptibility artifact to suggest acute or
chronic intracranial hemorrhage.

No mass lesion, midline shift or mass effect. Mild diffuse
ventricular prominence related to global parenchymal volume loss of
hydrocephalus. No extra-axial fluid collection. Pituitary gland
within normal limits.

Vascular: Major intracranial vascular flow voids maintained.

Skull and upper cervical spine: Craniocervical junction within
normal limits. Multilevel cervical spondylolysis noted within the
upper cervical spine, incompletely evaluated on this exam. Bone
marrow signal intensity within normal limits. No scalp soft tissue
abnormality.

Sinuses/Orbits: Globes and orbital soft tissues within normal
limits. Scattered mucosal thickening throughout the ethmoidal air
cells and maxillary sinuses. Small air-fluid levels noted within the
left maxillary and sphenoid sinuses. Trace bilateral mastoid
effusions, of doubtful significance.

Other: None.
IMPRESSION: 1. No acute intracranial abnormality identified.
2. Small chronic infarcts involving the left occipital lobe, right
lateral temporal lobe, and left anterior frontal lobe, stable from
previous.
3. Underlying generalized age-related cerebral atrophy with advanced
chronic microvascular disease, stable.

## 2019-07-22 IMAGING — DX DG CHEST 1V PORT
1 series · 1 of 1 positions shown · non-contrast
Comparison: 07/06/2018

CLINICAL DATA: Altered mental status.

EXAM:
PORTABLE CHEST 1 VIEW

[chest ap]
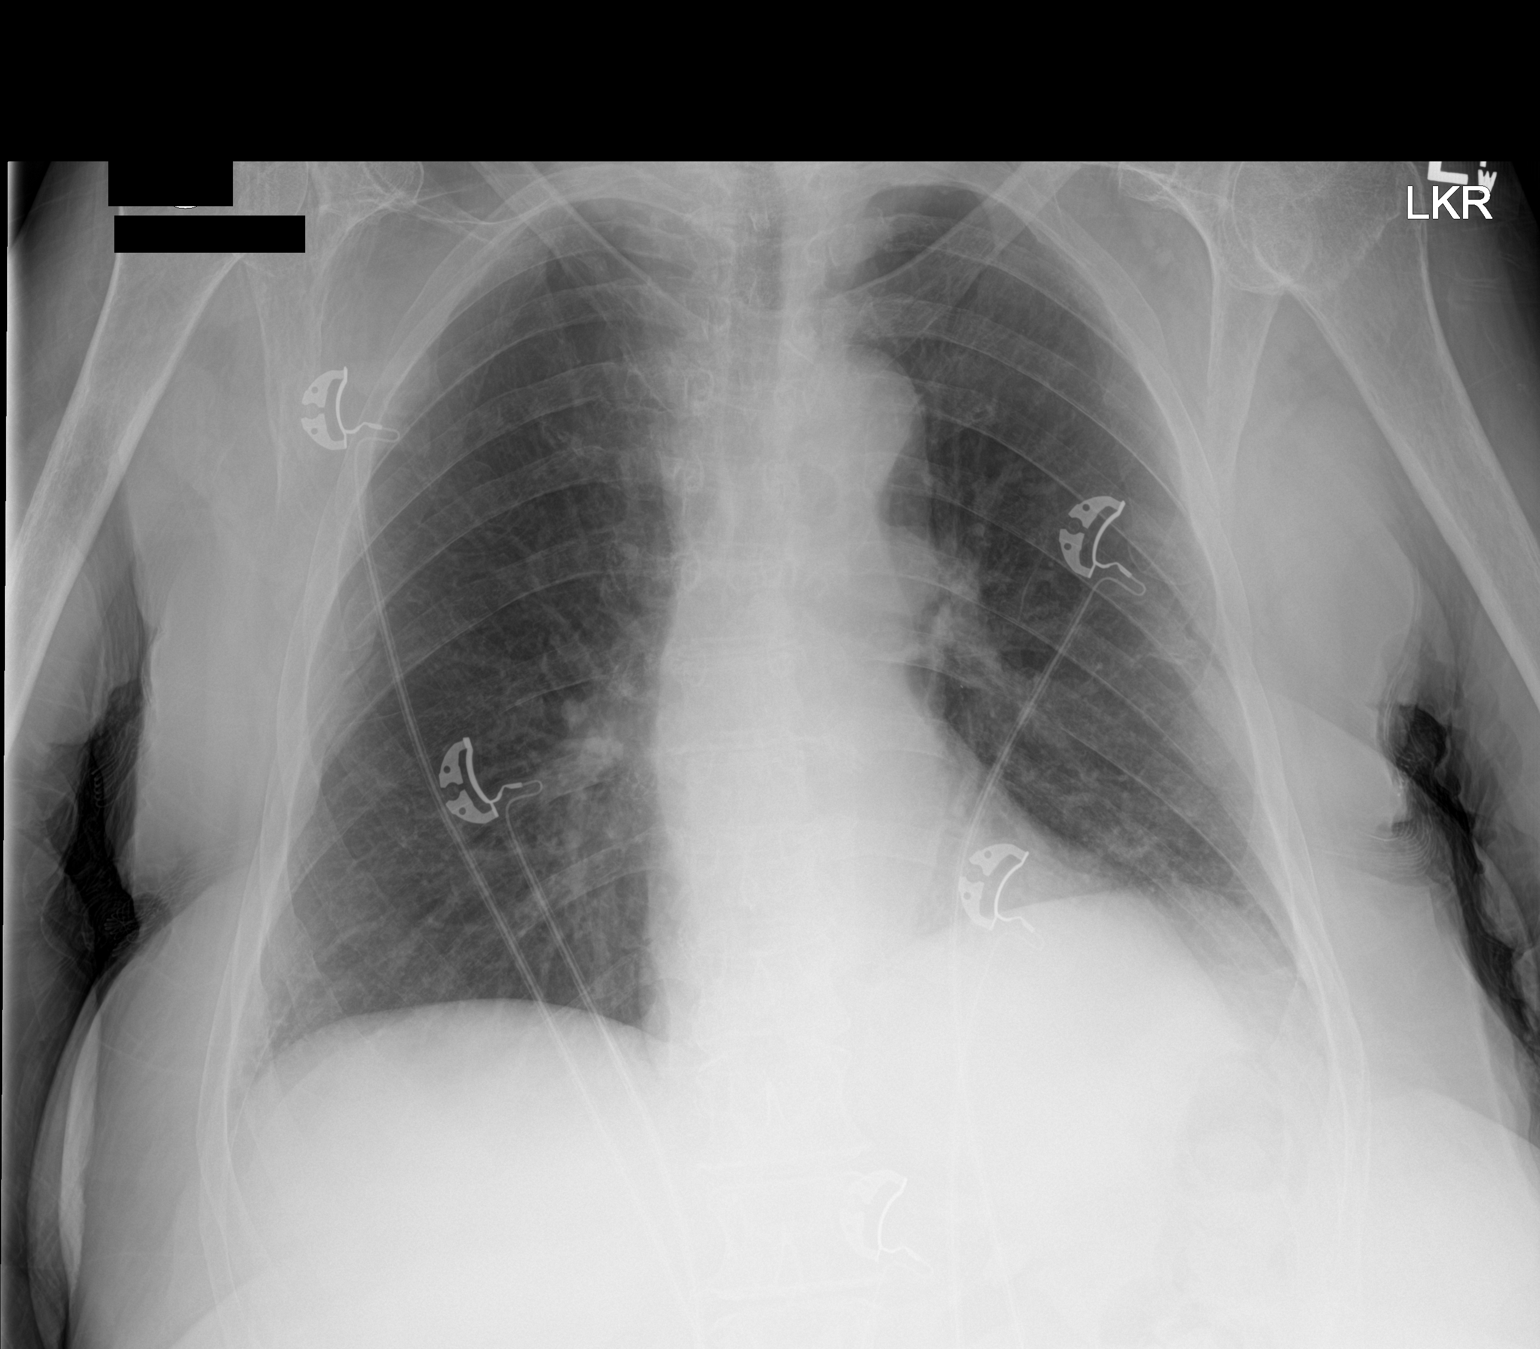

[1 of 1 positions shown; findings below may reference images not displayed]

FINDINGS: The cardiomediastinal silhouette is unremarkable.

Minimal LEFT basilar atelectasis noted.

There is no evidence of focal airspace disease, pulmonary edema,
suspicious pulmonary nodule/mass, pleural effusion, or pneumothorax.

No acute bony abnormalities are identified.
IMPRESSION: Minimal LEFT basilar atelectasis without other acute abnormality.
